# Patient Record
Sex: Female | Born: 1965 | Race: White | Hispanic: No | Smoking: Current some day smoker
Health system: Southern US, Community
[De-identification: ages and names within clinical notes are randomized; demographics above are authoritative.]

## PROBLEM LIST (undated history)

## (undated) DIAGNOSIS — F1910 Other psychoactive substance abuse, uncomplicated: Secondary | ICD-10-CM

## (undated) DIAGNOSIS — J449 Chronic obstructive pulmonary disease, unspecified: Secondary | ICD-10-CM

## (undated) DIAGNOSIS — N809 Endometriosis, unspecified: Secondary | ICD-10-CM

## (undated) DIAGNOSIS — T1491XA Suicide attempt, initial encounter: Secondary | ICD-10-CM

## (undated) DIAGNOSIS — R102 Pelvic and perineal pain: Secondary | ICD-10-CM

## (undated) DIAGNOSIS — K259 Gastric ulcer, unspecified as acute or chronic, without hemorrhage or perforation: Secondary | ICD-10-CM

## (undated) DIAGNOSIS — R55 Syncope and collapse: Secondary | ICD-10-CM

## (undated) DIAGNOSIS — G8929 Other chronic pain: Secondary | ICD-10-CM

## (undated) DIAGNOSIS — J45909 Unspecified asthma, uncomplicated: Secondary | ICD-10-CM

## (undated) HISTORY — PX: TOOTH EXTRACTION: SUR596

## (undated) HISTORY — PX: RADICAL HYSTERECTOMY WITH TRANSPOSITION OF OVARIES: SHX6222

## (undated) HISTORY — DX: Gastric ulcer, unspecified as acute or chronic, without hemorrhage or perforation: K25.9

## (undated) HISTORY — PX: CHOLECYSTECTOMY: SHX55

## (undated) HISTORY — PX: CERVICAL CONE BIOPSY: SUR198

## (undated) HISTORY — PX: ABDOMINAL HYSTERECTOMY: SHX81

---

## 1991-01-23 HISTORY — PX: TUBAL LIGATION: SHX77

## 1997-06-01 ENCOUNTER — Encounter: Admission: RE | Admit: 1997-06-01 | Discharge: 1997-06-01 | Payer: Self-pay | Admitting: Obstetrics & Gynecology

## 1997-06-01 ENCOUNTER — Other Ambulatory Visit: Admission: RE | Admit: 1997-06-01 | Discharge: 1997-06-01 | Payer: Self-pay | Admitting: Obstetrics & Gynecology

## 1999-06-29 ENCOUNTER — Encounter: Admission: RE | Admit: 1999-06-29 | Discharge: 1999-06-29 | Payer: Self-pay | Admitting: Internal Medicine

## 1999-08-28 ENCOUNTER — Inpatient Hospital Stay (HOSPITAL_COMMUNITY): Admission: EM | Admit: 1999-08-28 | Discharge: 1999-08-30 | Payer: Self-pay | Admitting: Emergency Medicine

## 1999-09-29 ENCOUNTER — Encounter: Admission: RE | Admit: 1999-09-29 | Discharge: 1999-09-29 | Payer: Self-pay | Admitting: Internal Medicine

## 1999-10-20 ENCOUNTER — Encounter: Admission: RE | Admit: 1999-10-20 | Discharge: 1999-10-20 | Payer: Self-pay | Admitting: Internal Medicine

## 2000-08-24 ENCOUNTER — Encounter: Payer: Self-pay | Admitting: *Deleted

## 2000-08-24 ENCOUNTER — Emergency Department (HOSPITAL_COMMUNITY): Admission: EM | Admit: 2000-08-24 | Discharge: 2000-08-24 | Payer: Self-pay | Admitting: *Deleted

## 2000-09-24 ENCOUNTER — Ambulatory Visit (HOSPITAL_COMMUNITY): Admission: RE | Admit: 2000-09-24 | Discharge: 2000-09-24 | Payer: Self-pay | Admitting: Internal Medicine

## 2001-05-26 ENCOUNTER — Encounter (HOSPITAL_COMMUNITY): Admission: RE | Admit: 2001-05-26 | Discharge: 2001-06-25 | Payer: Self-pay | Admitting: Orthopedic Surgery

## 2001-06-21 ENCOUNTER — Encounter: Payer: Self-pay | Admitting: Emergency Medicine

## 2001-06-21 ENCOUNTER — Emergency Department (HOSPITAL_COMMUNITY): Admission: EM | Admit: 2001-06-21 | Discharge: 2001-06-21 | Payer: Self-pay | Admitting: Emergency Medicine

## 2001-11-03 ENCOUNTER — Other Ambulatory Visit: Admission: RE | Admit: 2001-11-03 | Discharge: 2001-11-03 | Payer: Self-pay | Admitting: *Deleted

## 2001-11-24 ENCOUNTER — Encounter (INDEPENDENT_AMBULATORY_CARE_PROVIDER_SITE_OTHER): Payer: Self-pay | Admitting: Specialist

## 2001-11-24 ENCOUNTER — Ambulatory Visit (HOSPITAL_COMMUNITY): Admission: RE | Admit: 2001-11-24 | Discharge: 2001-11-24 | Payer: Self-pay | Admitting: *Deleted

## 2001-12-07 ENCOUNTER — Emergency Department (HOSPITAL_COMMUNITY): Admission: EM | Admit: 2001-12-07 | Discharge: 2001-12-07 | Payer: Self-pay | Admitting: Emergency Medicine

## 2001-12-14 ENCOUNTER — Emergency Department (HOSPITAL_COMMUNITY): Admission: EM | Admit: 2001-12-14 | Discharge: 2001-12-14 | Payer: Self-pay | Admitting: *Deleted

## 2001-12-14 ENCOUNTER — Encounter: Payer: Self-pay | Admitting: *Deleted

## 2002-04-22 ENCOUNTER — Emergency Department (HOSPITAL_COMMUNITY): Admission: EM | Admit: 2002-04-22 | Discharge: 2002-04-22 | Payer: Self-pay | Admitting: *Deleted

## 2002-05-19 ENCOUNTER — Encounter: Payer: Self-pay | Admitting: Emergency Medicine

## 2002-05-19 ENCOUNTER — Emergency Department (HOSPITAL_COMMUNITY): Admission: EM | Admit: 2002-05-19 | Discharge: 2002-05-19 | Payer: Self-pay | Admitting: Emergency Medicine

## 2002-05-27 ENCOUNTER — Encounter: Payer: Self-pay | Admitting: Emergency Medicine

## 2002-05-27 ENCOUNTER — Emergency Department (HOSPITAL_COMMUNITY): Admission: EM | Admit: 2002-05-27 | Discharge: 2002-05-27 | Payer: Self-pay | Admitting: Emergency Medicine

## 2003-01-11 ENCOUNTER — Emergency Department (HOSPITAL_COMMUNITY): Admission: EM | Admit: 2003-01-11 | Discharge: 2003-01-11 | Payer: Self-pay | Admitting: Emergency Medicine

## 2003-06-23 ENCOUNTER — Emergency Department (HOSPITAL_COMMUNITY): Admission: EM | Admit: 2003-06-23 | Discharge: 2003-06-23 | Payer: Self-pay | Admitting: Emergency Medicine

## 2004-03-06 ENCOUNTER — Emergency Department (HOSPITAL_COMMUNITY): Admission: EM | Admit: 2004-03-06 | Discharge: 2004-03-06 | Payer: Self-pay | Admitting: Emergency Medicine

## 2004-08-10 ENCOUNTER — Emergency Department (HOSPITAL_COMMUNITY): Admission: EM | Admit: 2004-08-10 | Discharge: 2004-08-10 | Payer: Self-pay | Admitting: Emergency Medicine

## 2005-05-28 ENCOUNTER — Inpatient Hospital Stay (HOSPITAL_COMMUNITY): Admission: EM | Admit: 2005-05-28 | Discharge: 2005-05-30 | Payer: Self-pay | Admitting: Emergency Medicine

## 2005-09-24 ENCOUNTER — Emergency Department: Payer: Self-pay | Admitting: Emergency Medicine

## 2005-11-23 ENCOUNTER — Emergency Department: Payer: Self-pay

## 2005-11-28 ENCOUNTER — Emergency Department: Payer: Self-pay | Admitting: Emergency Medicine

## 2006-01-22 DIAGNOSIS — T1491XA Suicide attempt, initial encounter: Secondary | ICD-10-CM

## 2006-01-22 HISTORY — DX: Suicide attempt, initial encounter: T14.91XA

## 2006-12-05 ENCOUNTER — Emergency Department: Payer: Self-pay | Admitting: Emergency Medicine

## 2006-12-08 ENCOUNTER — Inpatient Hospital Stay (HOSPITAL_COMMUNITY): Admission: EM | Admit: 2006-12-08 | Discharge: 2006-12-10 | Payer: Self-pay | Admitting: Emergency Medicine

## 2006-12-29 ENCOUNTER — Ambulatory Visit: Payer: Self-pay | Admitting: Psychiatry

## 2006-12-29 ENCOUNTER — Emergency Department (HOSPITAL_COMMUNITY): Admission: EM | Admit: 2006-12-29 | Discharge: 2006-12-29 | Payer: Self-pay | Admitting: Emergency Medicine

## 2006-12-29 ENCOUNTER — Inpatient Hospital Stay (HOSPITAL_COMMUNITY): Admission: AD | Admit: 2006-12-29 | Discharge: 2006-12-30 | Payer: Self-pay | Admitting: Psychiatry

## 2008-02-01 ENCOUNTER — Inpatient Hospital Stay: Payer: Self-pay | Admitting: Internal Medicine

## 2008-02-07 ENCOUNTER — Ambulatory Visit: Payer: Self-pay | Admitting: Internal Medicine

## 2008-06-01 ENCOUNTER — Emergency Department: Payer: Self-pay | Admitting: Emergency Medicine

## 2008-06-06 ENCOUNTER — Emergency Department: Payer: Self-pay | Admitting: Emergency Medicine

## 2008-06-24 ENCOUNTER — Emergency Department: Payer: Self-pay | Admitting: Emergency Medicine

## 2008-07-02 ENCOUNTER — Ambulatory Visit: Payer: Self-pay | Admitting: Emergency Medicine

## 2008-11-10 ENCOUNTER — Emergency Department (HOSPITAL_COMMUNITY): Admission: EM | Admit: 2008-11-10 | Discharge: 2008-11-11 | Payer: Self-pay | Admitting: Emergency Medicine

## 2009-05-07 ENCOUNTER — Emergency Department (HOSPITAL_COMMUNITY): Admission: EM | Admit: 2009-05-07 | Discharge: 2009-05-07 | Payer: Self-pay | Admitting: Emergency Medicine

## 2009-05-07 IMAGING — CT CT ABD-PELV W/O CM
1 of 2 series · 16 of 32 positions shown, 20 images · non-contrast
Comparison: none

REASON FOR EXAM: (1) R sided pain and hematuria; (2) r sided pain and
hematuria
COMMENTS:

[Series 2: stone · axial · 0.69mm/px · z∈[-381,+30]mm · 16 of 149 slices shown, 20 images]
[im 6/149  soft-tissue]
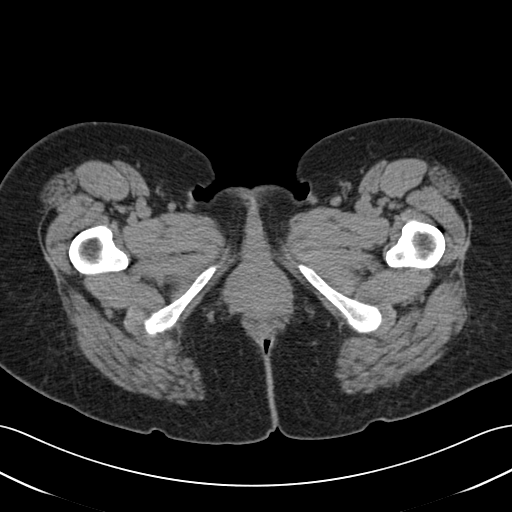
[im 6/149  bone]
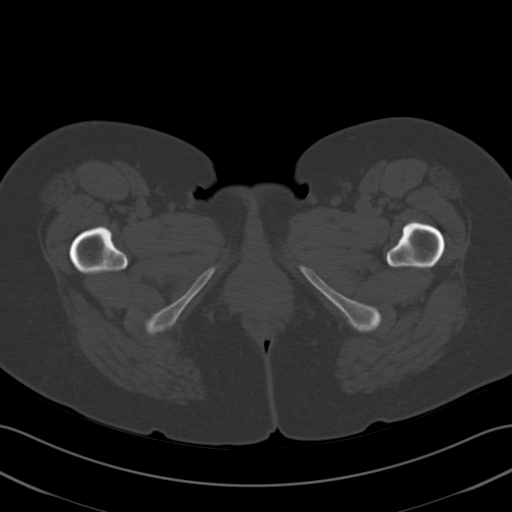
[im 16/149  soft-tissue]
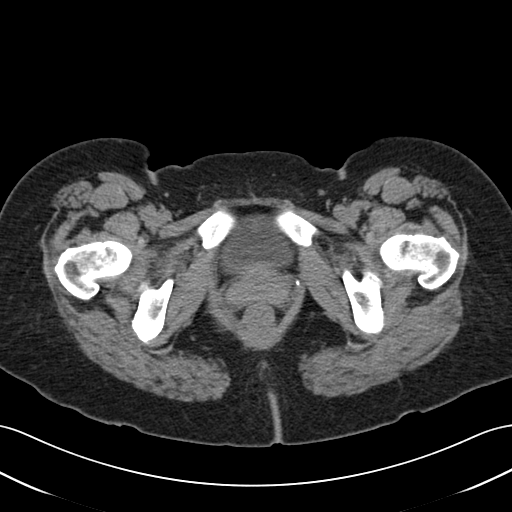
[im 27/149  soft-tissue]
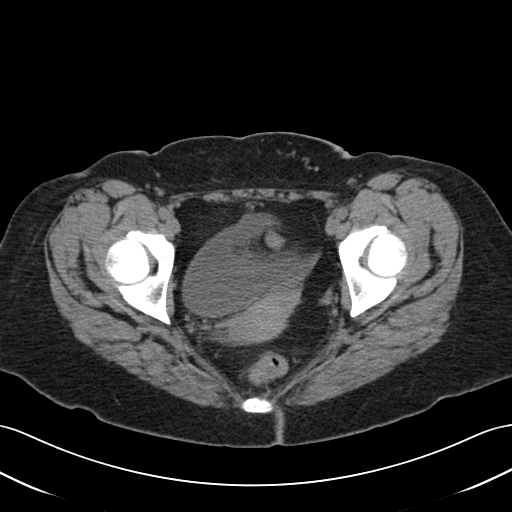
[im 38/149  soft-tissue]
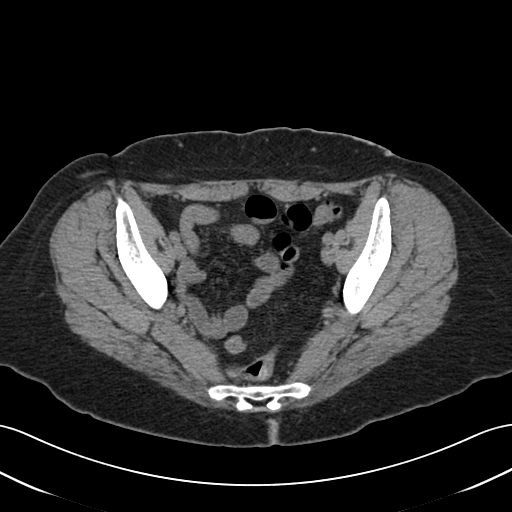
[im 48/149  soft-tissue]
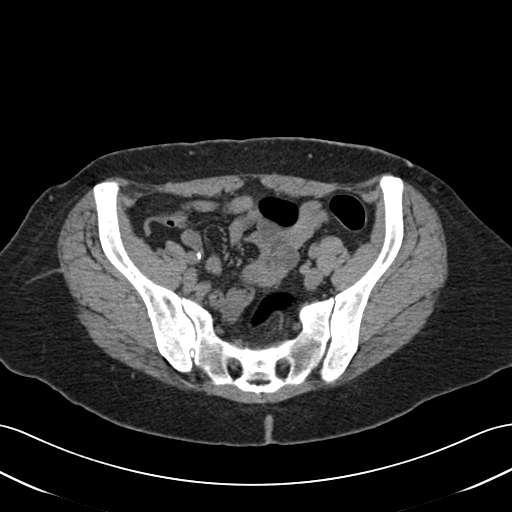
[im 59/149  soft-tissue]
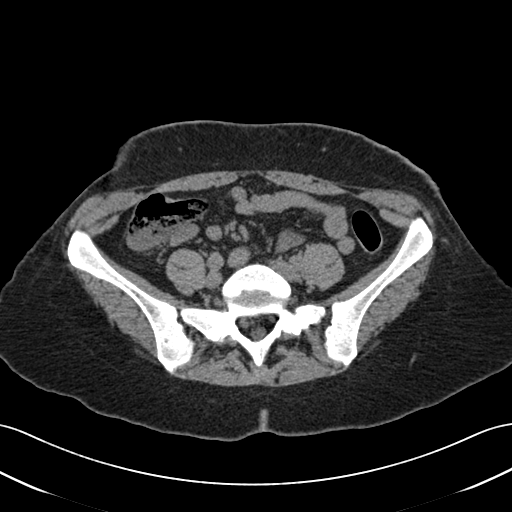
[im 69/149  soft-tissue]
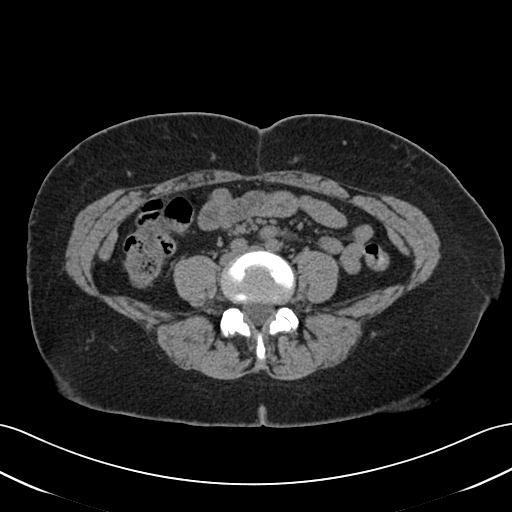
[im 80/149  soft-tissue]
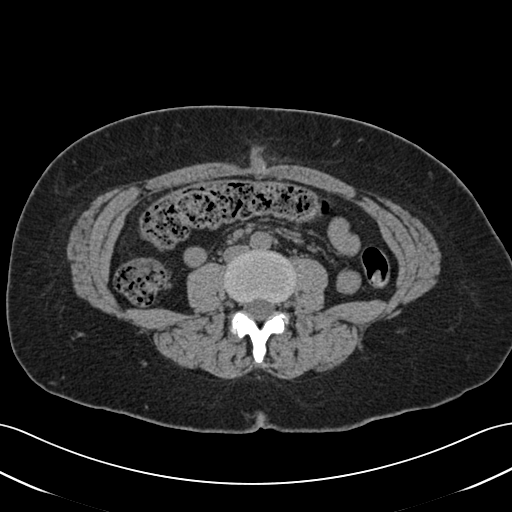
[im 90/149  soft-tissue]
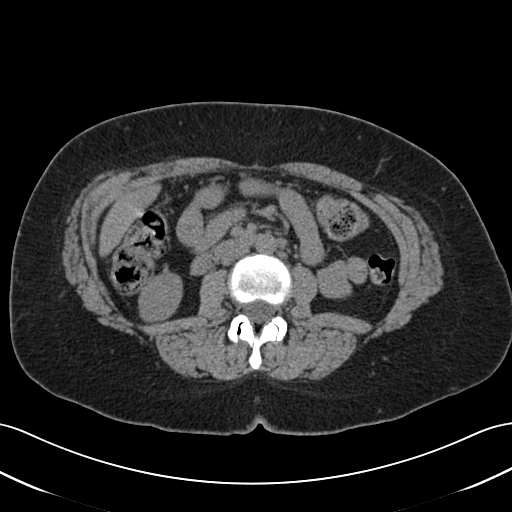
[im 90/149  bone]
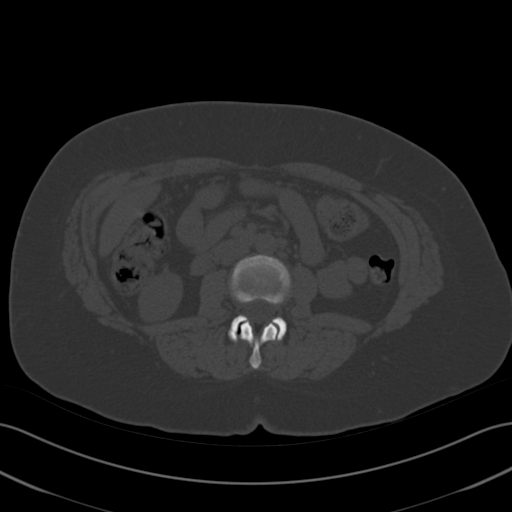
[im 101/149  soft-tissue]
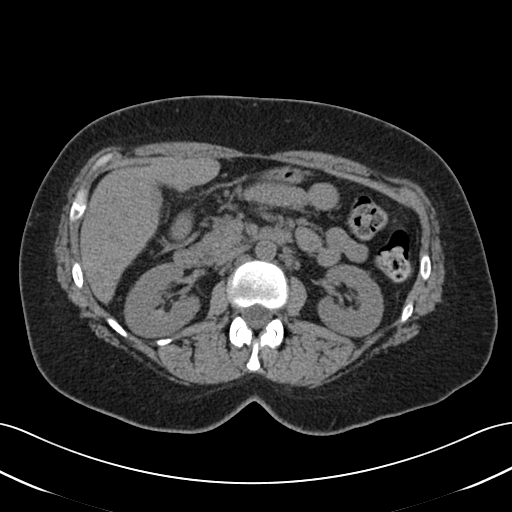
[im 112/149  soft-tissue]
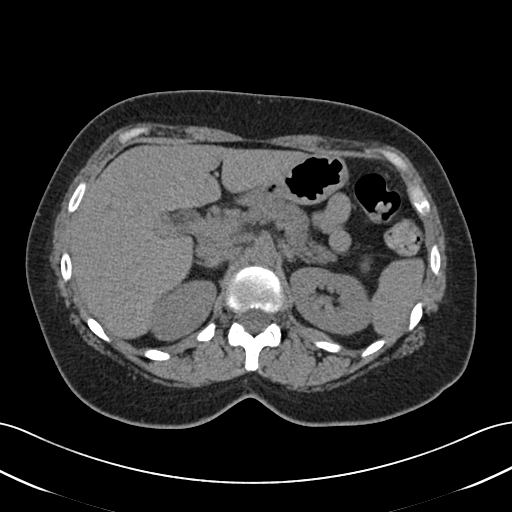
[im 122/149  soft-tissue]
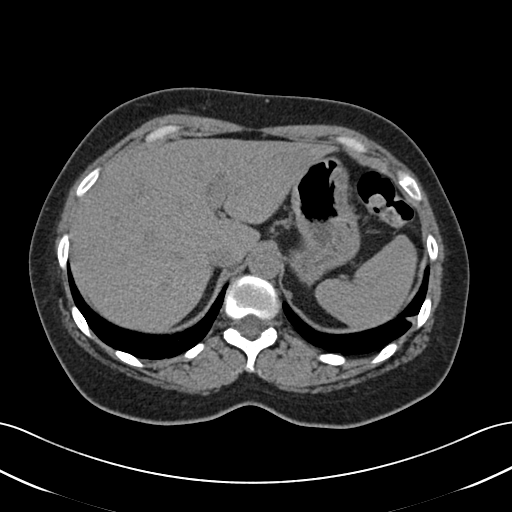
[im 127/149  lung]
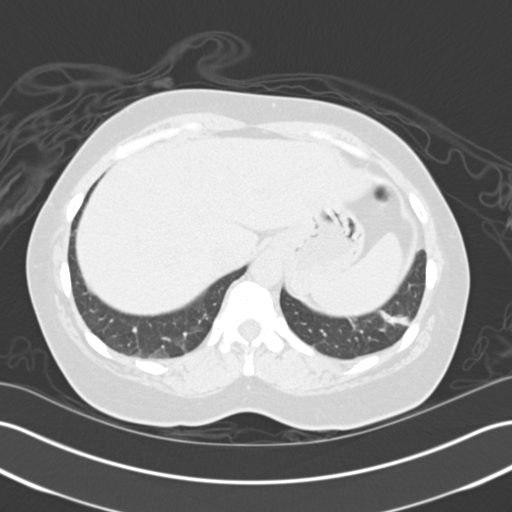
[im 133/149  soft-tissue]
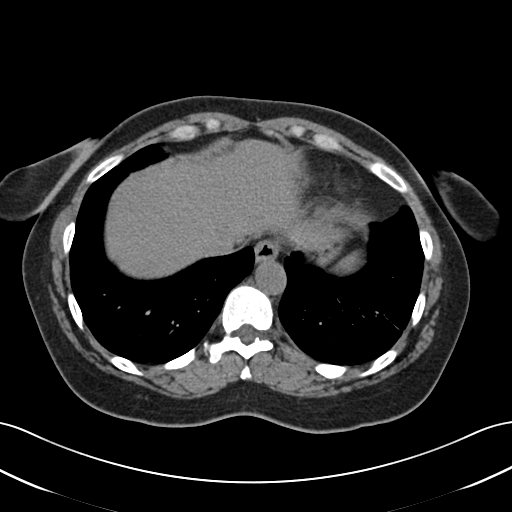
[im 133/149  lung]
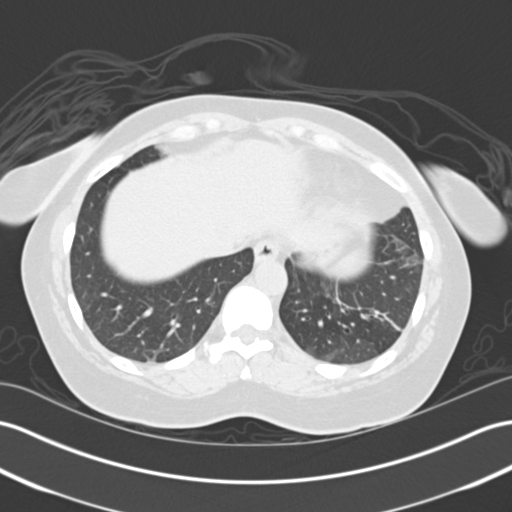
[im 138/149  lung]
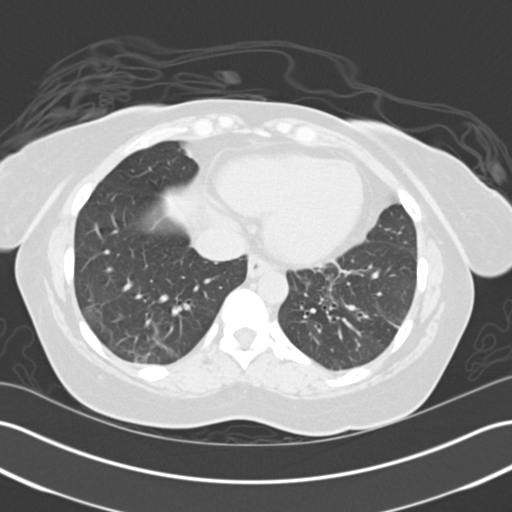
[im 143/149  soft-tissue]
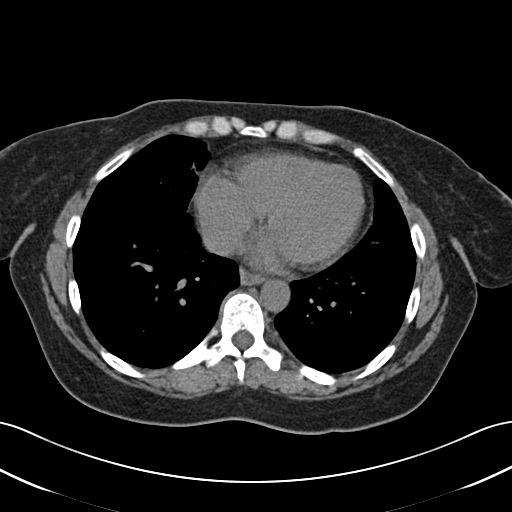
[im 143/149  lung]
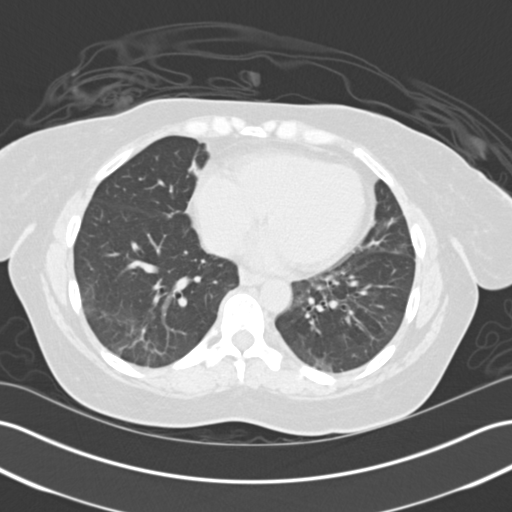

[16 of 32 positions shown; findings below may reference images not displayed]

PROCEDURE:     CT  - CT ABDOMEN AND PELVIS W[DATE]  [DATE]

RESULT:     The liver and spleen are normal.  The pancreas is normal.  The
patient has had a prior cholecystectomy.  The adrenals are normal.  No focal
renal abnormalities are identified. There is no hydrocephalus.  The RIGHT
lower quadrant is unremarkable.  Surgical clips are noted in the pelvis. No
inguinal adenopathy is noted. Basilar atelectasis is noted.
IMPRESSION: No acute abnormality.

## 2009-05-11 IMAGING — US US ABDOMEN COMPLETE
1 series · 14 of 25 positions shown · non-contrast
Comparison: 05/28/2005.

CLINICAL DATA: Cholelithiasis

ABDOMEN ULTRASOUND
TECHNIQUE: Complete abdominal ultrasound examination was performed including
evaluation of the liver, gallbladder, bile ducts, pancreas, kidneys, spleen,
IVC, and abdominal aorta.

[Series 1: unknown · 0.33mm/px · 14 of 44 slices shown]
[im 1/44]
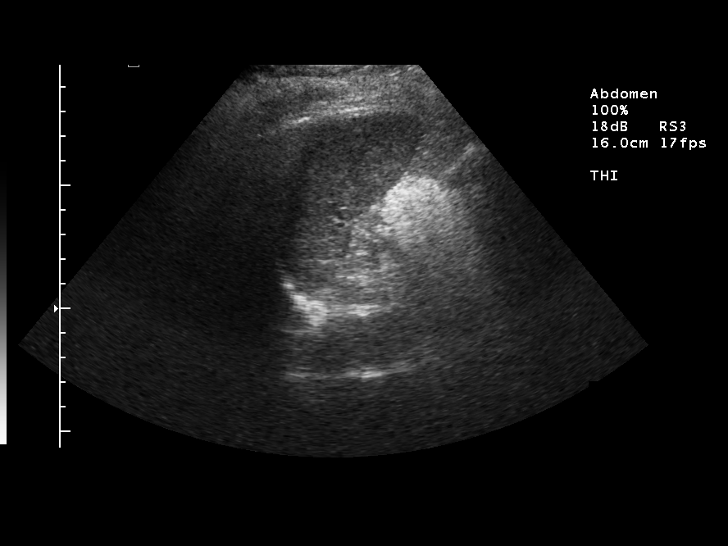
[im 4/44]
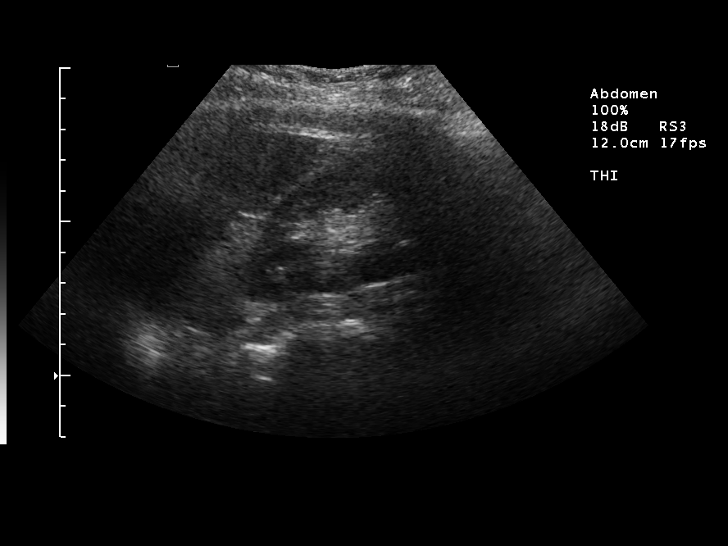
[im 8/44]
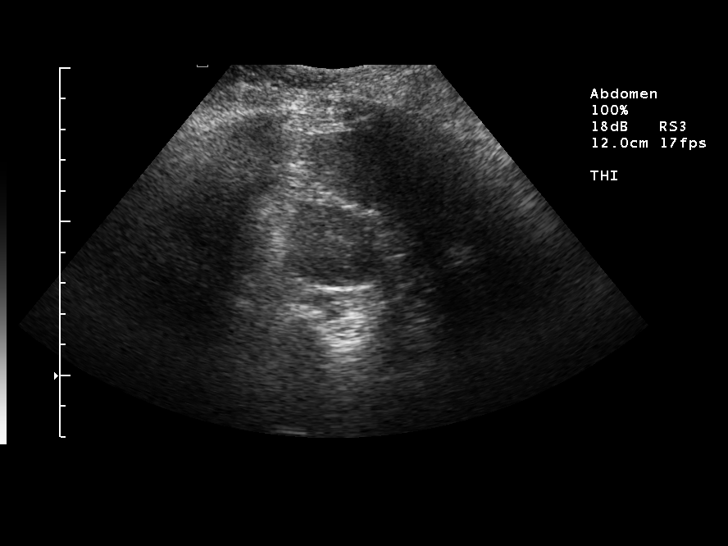
[im 11/44]
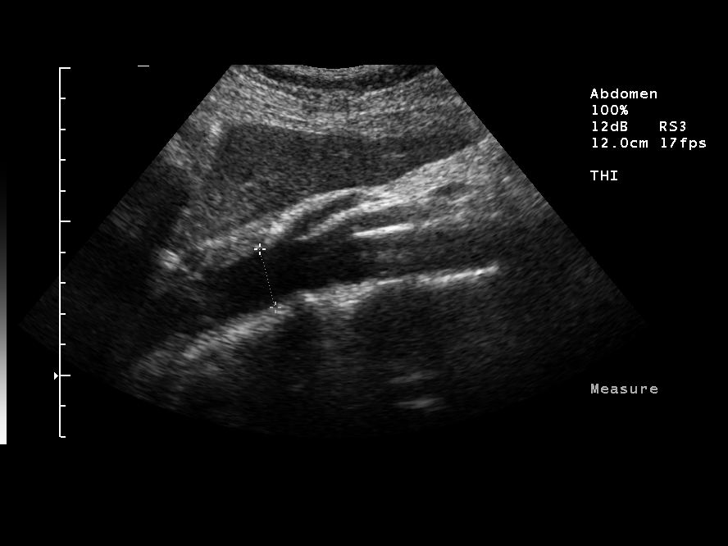
[im 15/44]
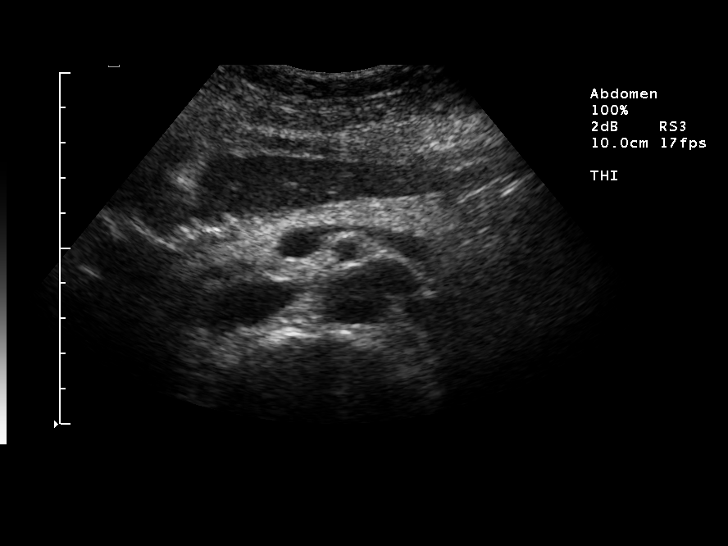
[im 17/44]
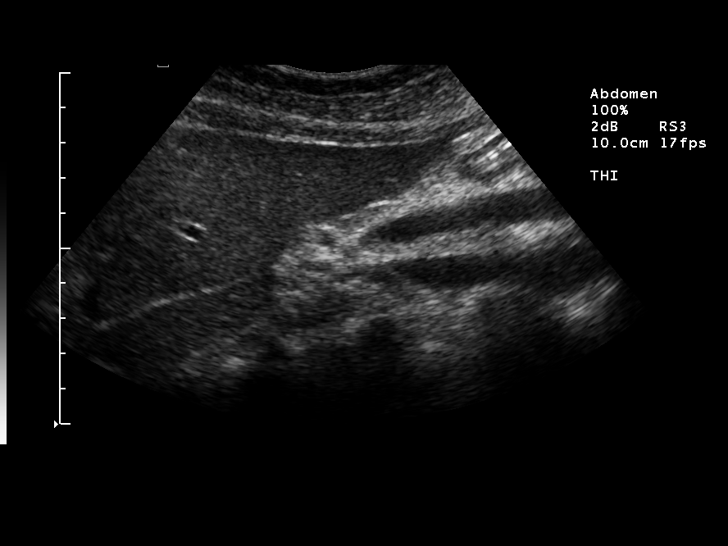
[im 20/44]
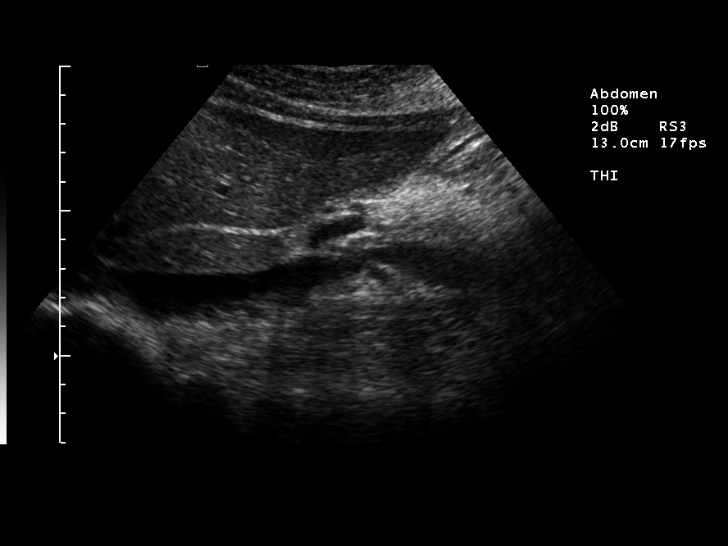
[im 24/44]
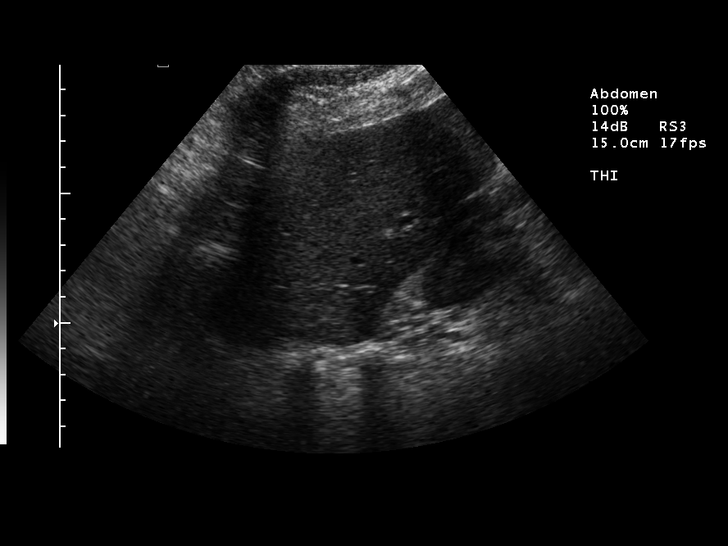
[im 27/44]
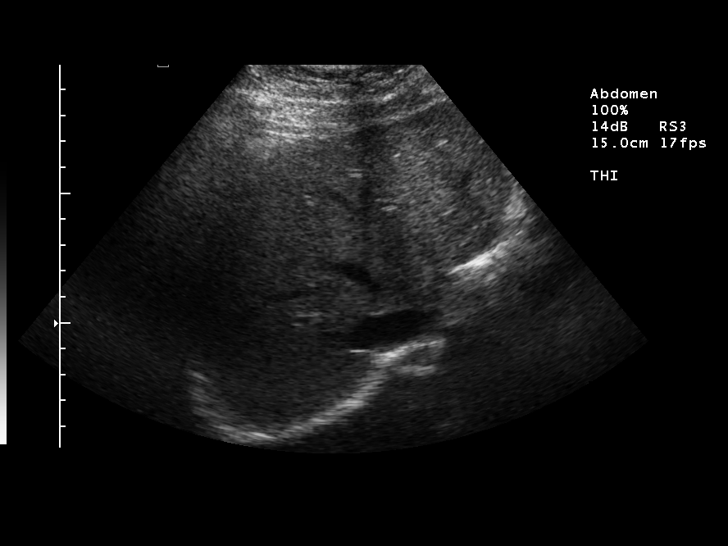
[im 29/44]
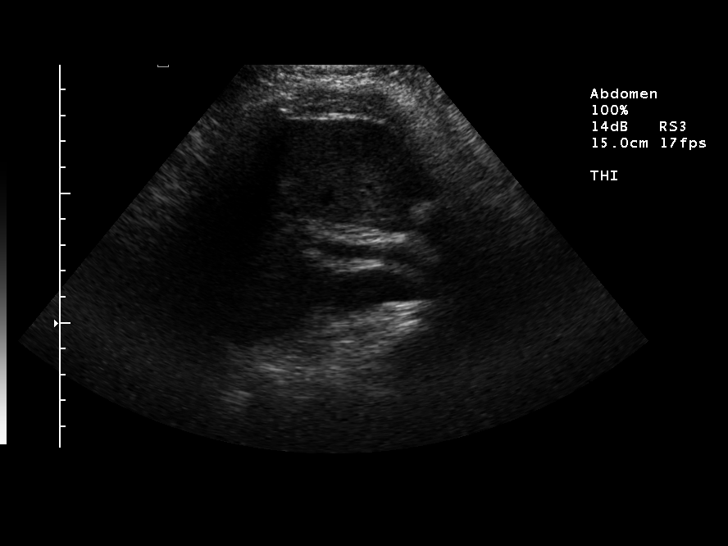
[im 33/44]
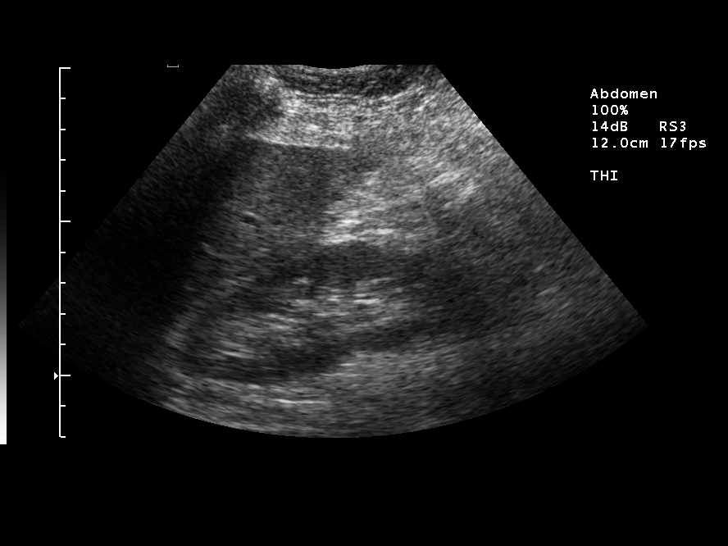
[im 36/44]
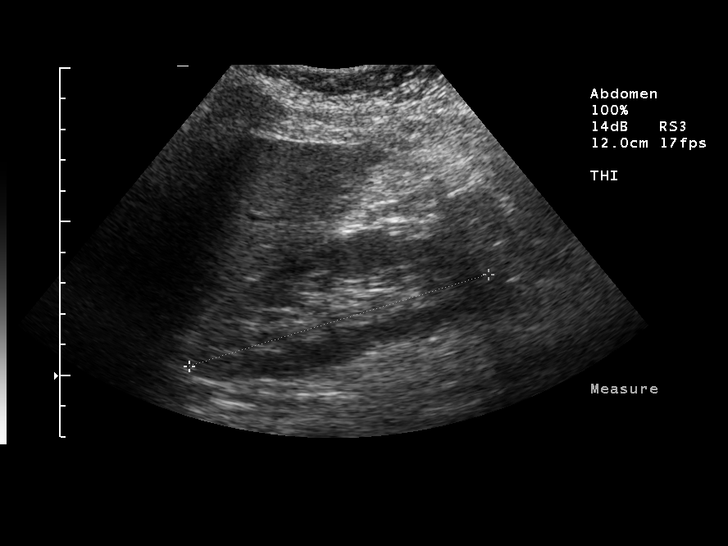
[im 40/44]
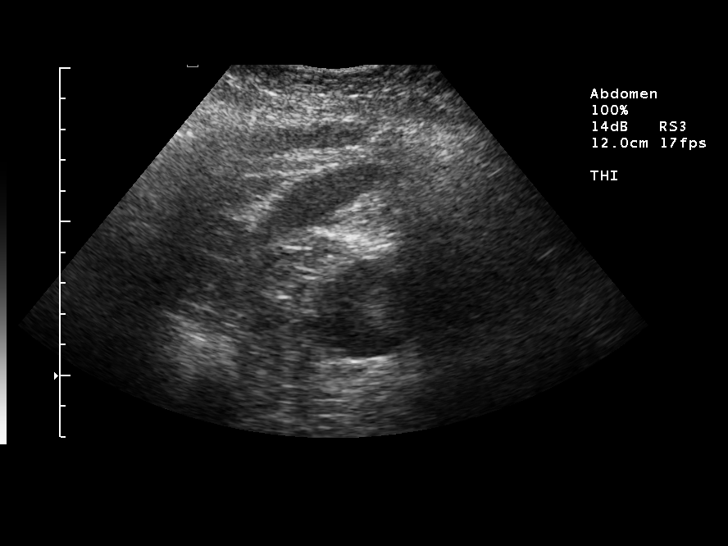
[im 44/44]
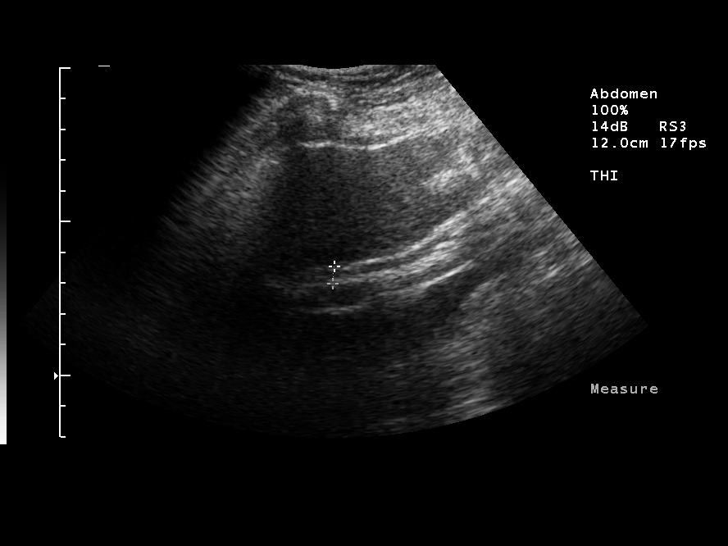

[14 of 25 positions shown; findings below may reference images not displayed]

FINDINGS: cholecystectomy. Common duct normal at 6 mm.

Liver, IVC, pancreas, and spleen all within normal limits.

Right kidney 10.6 and left kidney 10.6 cm. No hydronephrosis.

abdominal aorta non-aneurysmal without ascites.

IMPRESSION

1. No acute process in the  abdomen.
2. Cholecystectomy without biliary ductal dilatation.

## 2009-06-21 ENCOUNTER — Emergency Department: Payer: Self-pay | Admitting: Emergency Medicine

## 2010-03-28 ENCOUNTER — Emergency Department (HOSPITAL_COMMUNITY)
Admission: EM | Admit: 2010-03-28 | Discharge: 2010-03-28 | Discharge status: Against Medical Advice | Payer: No Typology Code available for payment source | Attending: Emergency Medicine | Admitting: Emergency Medicine

## 2010-03-28 DIAGNOSIS — M25569 Pain in unspecified knee: Secondary | ICD-10-CM | POA: Insufficient documentation

## 2010-04-27 LAB — DIFFERENTIAL
Basophils Relative: 1 % (ref 0–1)
Eosinophils Absolute: 0.4 10*3/uL (ref 0.0–0.7)
Lymphocytes Relative: 32 % (ref 12–46)
Lymphs Abs: 3.3 10*3/uL (ref 0.7–4.0)
Monocytes Absolute: 0.7 10*3/uL (ref 0.1–1.0)
Monocytes Relative: 6 % (ref 3–12)
Neutrophils Relative %: 57 % (ref 43–77)

## 2010-04-27 LAB — TROPONIN I: Troponin I: <0.01 ng/mL (ref 0.00–0.06)

## 2010-04-27 LAB — BASIC METABOLIC PANEL
CO2: 21 mEq/L (ref 19–32)
Calcium: 9.1 mg/dL (ref 8.4–10.5)
Creatinine, Ser: 0.77 mg/dL (ref 0.4–1.2)
GFR calc Af Amer: >60 mL/min (ref >60)
Glucose, Bld: 99 mg/dL (ref 70–99)

## 2010-04-27 LAB — POCT CARDIAC MARKERS: Myoglobin, poc: 30.7 ng/mL (ref 12–200)

## 2010-04-27 LAB — CBC: Platelets: 225 10*3/uL (ref 150–400)

## 2010-04-27 LAB — CK TOTAL AND CKMB (NOT AT ARMC)
CK, MB: 0.6 ng/mL (ref 0.3–4.0)
Relative Index: INVALID (ref 0.0–2.5)

## 2010-06-06 NOTE — Consult Note (Signed)
NAME:  Pamela Livingston, KOVAL NO.:  [id number]   MEDICAL RECORD NO.:  [id number]          PATIENT TYPE:  INP   LOCATION:  1502                         FACILITY:  [facility]   PHYSICIAN:  Antonietta Breach, M.D.  DATE OF BIRTH:  1965/02/12   DATE OF CONSULTATION:  12/10/2006  DATE OF DISCHARGE:  12/10/2006                                 CONSULTATION   REFERRING PHYSICIAN:   Mrs. [name] has recovered from her fatigue and depressed mood.  She is  not having any suicidal thoughts.  She has no thoughts of harming  others.  She has no delusions or hallucinations.  She is expressing an  appropriate level of regret about her overdose.  She realizes that it  was an impulsive act.  She is denying any hopelessness.  She describes  constructive future goals and interests.  Her energy is now within  normal limits.   The patient realizes that she has been worrying excessively and let a  number of worries come to a head.   MENTAL STATUS EXAM:  Mrs. [name] is alert.  She is oriented to all  spheres.  Her attention span is within normal limits.  Her eye contact  is good.  Her memory is intact except for the overdose blackout.  Her  fund of knowledge and intelligence are within normal limits.  Speech is  normal.  Thought process logical, coherent, goal-directed.  No looseness  of associations.  Thought content; no thoughts of harming herself, no  thoughts of harming others, no delusions.  No hallucinations. Judgment  is intact.  Affect is broad and appropriate.  Mood is within normal  limits.   ASSESSMENT:  1. Adjustment disorder with mixed disturbance of emotions and conduct.  2. 293.84-anxiety disorder not otherwise specified.  3. Polysubstance dependence.   Due to the patient's history of continued abuse of substances as well as  her recent impulsivity the undersigned did recommend that she attend an  inpatient rehabilitation program, however, the patient declined and she  is no  longer committable after recovering from her acute reactive  symptoms.   The patient does agree to call emergency services immediately for any  thoughts of harming herself, thoughts of harming others or distress.   RECOMMENDATIONS:  1. This patient could use some outpatient counseling for increasing      coping skills and stress management as well      as substance dependence outpatient therapy.  Would ask the case      manager to set this patient up with [facility]Drug Services in her      county as part of the county mental health system.  2. Twelve step groups and principles.      Antonietta Breach, M.D.  Electronically Signed     JW/MEDQ  D:  12/11/2006  T:  12/12/2006  Job:  [id number]

## 2010-06-06 NOTE — Consult Note (Signed)
NAME:  Pamela Livingston, Pamela Livingston NO.:  [id number]   MEDICAL RECORD NO.:  [id number]          PATIENT TYPE:  INP   LOCATION:  1223                         FACILITY:  [facility]   PHYSICIAN:  Antonietta Breach, M.D.  DATE OF BIRTH:  01-26-1965   DATE OF CONSULTATION:  12/09/2006  DATE OF DISCHARGE:                                 CONSULTATION   REASON FOR CONSULTATION:  Suicide attempt with overdose.   Dictation ended at this point.      Antonietta Breach, M.D.  Electronically Signed     JW/MEDQ  D:  12/09/2006  T:  12/10/2006  Job:  [id number]

## 2010-06-06 NOTE — Consult Note (Signed)
NAME:  Pamela Livingston, Pamela Livingston NO.:  [id number]   MEDICAL RECORD NO.:  [id number]          PATIENT TYPE:  INP   LOCATION:  1223                         FACILITY:  [facility]   PHYSICIAN:  Antonietta Breach, M.D.  DATE OF BIRTH:  1965/04/26   DATE OF CONSULTATION:  12/09/2006  DATE OF DISCHARGE:                                 CONSULTATION   REQUESTING PHYSICIAN:  [employer] [facility].   REASON FOR CONSULTATION:  Suicide attempt with overdose.   HISTORY OF PRESENT ILLNESS:  Pamela Livingston is a 45 year old female  admitted to the [facility] on December 08, 2006, due to an  overdose with Phenergan.   The patient took 500 mg of Phenergan with the intent to kill herself.  She has been under stress.  She married back in May of this year.  She  and her husband have had financial difficulties.  They had to move out  of one house into another and finally had [place]  She states that her husband has a severe drinking  problem.  She has not been wanting to be close to him.   The patient has approximately 4 weeks of depressed mood, decreased  energy, difficulty concentrating, anhedonia.  She has not been having  hallucinations or delusions.  Her orientation has been intact.  Her  memory function is intact.  She is cooperative with bedside care.   She has experienced severe obtundation followed by a stupor from the  Phenergan overdose.  However, she is now able to stay awake for the  interview.   PAST PSYCHIATRIC HISTORY:  The patient denies any history of increased  energy, elevated mood or decreased need for sleep.  She denies a history  of prior suicide attempts.  She denies any history of hallucination.  She does acknowledge a prior history of depression.   She was tried on Effexor, which made her feel worse.  She denies any  psychiatric admissions.   The patient does have an extensive history of cocaine use for several  months.   She used it every day, using large amounts of street value.  She continues to relapse approximately once a month.  She uses marijuana  daily.   FAMILY PSYCHIATRIC HISTORY:  None known.   SOCIAL HISTORY:  Please see the above.  The patient is unemployed.  She  denies drinking.   PAST MEDICAL HISTORY:  Overdose on Phenergan.   ALLERGIES:  ASPIRIN.   MEDICATIONS:  The MAR is reviewed.  The patient is on Ativan 2 mg q.4h.  p.r.n.   LABORATORY DATA:  TSH within normal limits.  Aspirin negative.  Alcohol  negative.  Sodium 142, BUN 15, creatinine 0.98.  WBC 9.8, hemoglobin  14.7, platelet count 286.  SGOT 25, SGPT 42.  Tylenol negative.  Urine  drug screen positive for benzodiazepines and cocaine.   REVIEW OF SYSTEMS:  CONSTITUTIONAL:  Afebrile.  No weight loss.  HEAD:  No trauma.  EYES:  No visual changes.  EARS:  No hearing impairment.  NOSE:  No rhinorrhea.  MOUTH/THROAT:  No  sore throat.  NEUROLOGIC:  No  focal motor or sensory changes.  PSYCHIATRIC:  On review of the past  medical record, in May 2007 depression was listed, confirming the  patient's history.  CARDIOVASCULAR:  No chest pain, palpitations.  RESPIRATORY:  No coughing or wheezing.  GASTROINTESTINAL:  No nausea,  vomiting, diarrhea.  GENITOURINARY:  No dysuria.  SKIN:  Unremarkable.  ENDOCRINE/METABOLIC:  No heat or cold intolerance.  MUSCULOSKELETAL:  No  deformities.  HEMATOLOGIC/LYMPHATIC:  No anemia.   EXAMINATION:  VITAL SIGNS:  Temperature 99.5, pulse 83, respiratory rate  23, blood pressure 98/62, O2 saturation on room air 98%.  GENERAL APPEARANCE:  Pamela Livingston is a middle-aged female partially  reclined in the supine position in her hospital bed.  She has no  abnormal involuntary movements.  OTHER MENTAL STATUS EXAM:  Pamela Livingston is alert.  Her attention span is  mildly decreased.  Her eye contact is good.  Her concentration is mildly  decreased.  Her affect is constricted.  Mood is depressed.  She is   oriented completely to all spheres.  Her memory is intact to immediate,  recent and remote except for the overdose blackout.  Fund of knowledge  and intelligence are within normal limits.  Speech involves normal rate  and prosody without dysarthria.  Thought process is logical, coherent,  goal-directed.  No looseness of associations.  Abstraction is intact.  Language expression and comprehension intact.  Thought content:  The  patient acknowledges suicidal intent.  She has no hallucinations or  delusions.  Insight is partial.  Judgment is impaired.   ASSESSMENT:  AXIS I:  293.83, mood disorder, not otherwise specified  (the patient has a history of functional depression; however, the course  has been complicated with her substance abuse).  Polysubstance  dependence.  Rule out 296.33, major depressive disorder recurrent,  severe.  AXIS II:  Deferred.  AXIS III:  See general medical section above.  AXIS IV:  Marital, economic primary support group.  AXIS V:  30.   Pamela Livingston is still at risk to harm herself.   RECOMMENDATIONS:  1. Would admit the patient to an inpatient psychiatric dual      diagnosis program when she is medically cleared.  2. Psychotropic medication deferred.  3. The undersigned provided ego-supportive psychotherapy and      education.  4. Would continue the sitter.  5. Twelve-step method and groups.      Antonietta Breach, M.D.  Electronically Signed     JW/MEDQ  D:  12/09/2006  T:  12/10/2006  Job:  [id number]

## 2010-06-06 NOTE — H&P (Signed)
NAME:  Pamela Livingston, Pamela Livingston NO.:  [id number]   MEDICAL RECORD NO.:  [id number]          PATIENT TYPE:  INP   LOCATION:  1230                         FACILITY:  [facility]   PHYSICIAN:  Herbie Saxon, MDDATE OF BIRTH:  Sep 04, 1965   DATE OF ADMISSION:  12/08/2006  DATE OF DISCHARGE:                              HISTORY & PHYSICAL   CHIEF COMPLAINT:  Overdose of Phenergan.   HISTORY OF PRESENT ILLNESS:  This is a 45 year old lady who was brought  to the emergency room by the EMS.  The patient had apparently ingested  500 mg of Phenergan in an apparent suicide attempt.  She is heavily  sedated and unable to add an further history.  The urine toxicology did  show cocaine, marijuana and benzodiazepines.   PAST MEDICAL HISTORY:  Bronchial asthma.   PAST SURGICAL HISTORY:  Not known.   SOCIAL HISTORY:  No documented history of alcohol or tobacco smoking,  but cocaine, marijuana and benzodiazepine abuse.   FAMILY HISTORY:  Not available.   REVIEW OF SYSTEMS:  Not available.   MEDICATIONS:  Advair Diskus and Phenergan.   ALLERGIES:  ASPIRIN.   PHYSICAL EXAMINATION:  GENERAL:  This is a lady who is intermittently  agitated on arousal.  VITAL SIGNS:  Temperature 98, pulse 88, respirations 14, blood pressure  110/81.  HEENT:  Constricted pupils.  Oropharynx and nasopharynx are clear.  Head  is atraumatic, normocephalic.  NECK:  Supple.  No elevated JVD or carotid bruit.  CHEST:  Clear.  HEART:  S1, S2 with tachycardia.  ABDOMEN:  Soft, nontender, no organomegaly.  Bowel sounds normoactive.  She is obtunded.  EXTREMITIES:  Peripheral pulses present.  No pedal edema.   LABORATORY DATA AND X-RAY FINDINGS:  WBC 9.8, hematocrit 42, platelet  count 286.  Urine toxicology shows positive cocaine, benzodiazepine and  tetrahydrocannabinol.  PTT 37, INR 1.0.  Acetaminophen level less than  10.  Chemistries shows a sodium of 142, potassium 3.7, chloride 107,  bicarb 26,  glucose 95, BUN 15, creatinine 09.  Alcohol level less than  5.  Urinalysis cloudy, leukocyte esterase small, many bacteria, wbc's 7-  10.  Please note that the patient had been given charcoal by the  emergency room physician.   EKG shows sinus tachycardia at 126 per minute with no specific ST and T-  wave changes.   ASSESSMENT:  1. Drug overdose with Phenergan, suicide attempt.  2. Urinary tract infection.  3. Substance abuse with cocaine, marijuana, benzodiazepines.  4. Sinus tachycardia.   PLAN:  The patient is to be admitted to a step-down unit.  She is to be  on suicide watch.  She will be on IV Cardizem 10 mg q.6 h. for the next  24 hours and re-evaluate.  CK-MB, troponin and EKG q.8 h. x3 to rule out  any acute coronary spasm.  Thyroid function test, urinalysis and culture  sensitivities.  Start on Rocephin 1 g IV daily.  To be on IV D-5 normal  saline at 100 mL an hour.  We will get a psychiatric evaluation.  Oxygen  2 L nasal  cannula p.r.n.  She is to be on fall, aspiration and seizure  precautions.  She will be on Lovenox 40 mg daily for DVT prophylaxis,  Protonix 40 mg IV daily and Ativan 2 mg IV q.4 h. p.r.n.  She will be  seen by case management and be held in detox as inpatient when she is  medically stable.  Tylenol 650 mg q.4-6 h. p.r.n. and Dilaudid 0.5 mg to  1 mg q.4 h. p.r.n. severe pain.  DuoNeb q.6 h. p.r.n.      Herbie Saxon, MD  Electronically Signed     MIO/MEDQ  D:  12/08/2006  T:  12/09/2006  Job:  [id number]

## 2010-06-06 NOTE — Discharge Summary (Signed)
NAME:  Pamela Livingston, Pamela Livingston NO.:  [id number]   MEDICAL RECORD NO.:  [id number]          PATIENT TYPE:  IPS   LOCATION:  0603                          FACILITY:  [facility]   PHYSICIAN:  Jasmine Pang, M.D. DATE OF BIRTH:  [birth year]   DATE OF ADMISSION:  12/29/2006  DATE OF DISCHARGE:  12/30/2006                               DISCHARGE SUMMARY   IDENTIFICATION:  A 45 year old married white female from [place],  [place].   HISTORY OF PRESENT ILLNESS:  The patient was admitted stating she wanted  detox from crack, cocaine and help with her mental health problems.  She  states she has used for the past 4 days straight and is tired of this  life.  She has issues with anxiety and feels very depressed about her  family situation.  She cannot see her 57 year old daughter because she  lost custody.  The patient was married 5 months ago, but there are still  some altercations with her ex-husband who also uses.  She had thoughts  of killing herself before she went on a 4-day crack binge.  At the time  of this admission, she was not suicidal, however.  The patient has a  history of depression and anxiety.  She is not currently in any  outpatient treatment.  She is on no medication.  She has been seen at  the [facility] in the past.  The patient  denies alcohol abuse.  She has a history of gastric ulcer disease.  She  is on Protonix.  She denies any other prescriptions.  She is allergic to  ASPIRIN.   PHYSICAL FINDINGS:  A complete physical exam was done in the ED prior to  admission.  There were no acute physical problems noted.   Admission laboratories were done in the ED prior to admission.  Urine  pregnancy test was negative.  A basic metabolic panel was within normal  limits except for a slightly elevated glucose at 102.  CBC was within  normal limits except for a slightly elevated hemoglobin of 15.3.  Urine  drug screen was positive for  cocaine and benzodiazepines and THC.   HOSPITAL COURSE:  Upon admission, the patient was started on trazodone  100 mg p.o. at bedtime p.r.n. insomnia.  The patient was started on  amantadine 100 mg p.o. b.i.d.  She was also started on a clonidine detox  protocol, but it was not felt later that she needed this because she was  not abusing opiates.  The patient was friendly and cooperative in exam  with me.  She had good eye contact.  Speech was normal in rate and flow.  Psychomotor activity was within normal limits.  She stated she wanted to  go home.  The mood was less depressed and anxious.  Affect, wide range.  There was no suicidal or homicidal ideation.  No thoughts of self-  injurious behavior.  Thoughts were logical and goal directed.  Thought  content, no predominant theme.  Cognition was grossly back to baseline.  The patient stated she wanted to go  home to be with her family.  She was  not planning to hurt herself.  She states she planned not use drugs any  more.  It was felt, the patient was stable to be discharged today.   DISCHARGE DIAGNOSES:  AXIS I:  Cocaine abuse.  Cocaine dependence.  Depressive disorder, not otherwise specified.  AXIS II:  None.  AXIS III:  Gastric ulcer disease.  AXIS IV:  Moderate (problems with primary support group, economic  problem, burden of chemical dependence use).  AXIS V:  Global Assessment of Functioning upon discharge was 60.  Global  Assessment of Functioning upon admission was 50.  Global Assessment of  Functioning highest past year was 65.   DISCHARGE PLAN:  There were no specific activity level or dietary  restrictions.   POST HOSPITAL CARE PLANS:  The patient will see Dr. Joni Reining on January 07, 2007, at 08:30 a.m.  She will also see a therapist at [facility] on  January 07, 2007, at 04:45 p.m.   DISCHARGE MEDICATION:  Amantadine 100 mg one tablet p.o. b.i.d.      Jasmine Pang, M.D.  Electronically Signed      BHS/MEDQ  D:  12/30/2006  T:  12/31/2006  Job:  [id number]

## 2010-06-09 NOTE — H&P (Signed)
NAME:  Pamela Livingston, Pamela Livingston                             ACCOUNT NO.:  [id number]   MEDICAL RECORD NO.:  [id number]                   PATIENT TYPE:  AMB   LOCATION:  SDC                                  FACILITY:  [facility]   PHYSICIAN:  [place] B. Earlene Plater, M.D.               DATE OF BIRTH:  May 15, 1965   DATE OF ADMISSION:  11/24/2001  DATE OF DISCHARGE:                                HISTORY & PHYSICAL   PREOPERATIVE DIAGNOSES:  1. Pelvic pain.  2. Dysmenorrhea.  3. History of endometriosis.  4. Left ovarian cyst.   PROCEDURE:  Laparoscopic left salpingo-oophorectomy.   HISTORY OF PRESENT ILLNESS:  A 45 year old white female gravida 2, para 2  with a history of endometriosis with associated dysmenorrhea and  dyspareunia.  Has previously had laparoscopy [facility] and was found to  have endometriosis and is status post right salpingo-oophorectomy for a  previous ovarian cyst.  Is using narcotic pain medication during her menses.  Is sexually active using tubal ligation for contraception.  Has used birth  control pills which did not improve her symptoms and were associated with  menstrual migraines, nausea, and vomiting.  Has used Depo Lupron in the past  which did improve pain, however, had severe emotional lability.  Is  interested in proceeding with definitive surgical therapy.   PAST MEDICAL HISTORY:  Asthma.  No recent exacerbations and no history of  hospitalization or need for ventilator.  Also, history of depression.   PAST OBSTETRICAL HISTORY:  Two previous deliveries.  Cesarean section x1.  Vaginal delivery x1.  History of migraines.   PAST SURGICAL HISTORY:  Laparoscopy in 1999, endometriosis and another  laparoscopy for ovarian, cesarean section x1, tubal ligation, cervical  conization.   MEDICATIONS:  Advair, Combivent, Singulair, clonazepam, Darvocet, and  another depression medication.  The patient unsure of the name.   ALLERGIES:  ASPIRIN.   SOCIAL HISTORY:  No tobacco,  alcohol, or other drugs.   FAMILY HISTORY:  Noncontributory.   REVIEW OF SYSTEMS:  Otherwise noncontributory.   PHYSICAL EXAMINATION:  VITAL SIGNS:  Blood pressure 110/72, weight 143.5,  height 5 feet 0 inches.  GENERAL:  Alert and oriented, in no acute distress.  SKIN:  Warm, dry.  No lesions.  NECK:  Supple.  No thyromegaly.  HEART:  Regular rate and rhythm.  LUNGS:  Clear to auscultation.  ABDOMEN:  Liver, spleen normal without hernia.  Pfannenstiel and laparoscopy  incisions noted.  LYMPH NODE:  Survey negative to neck, axilla, and groin.  BREASTS:  No dominant masses, nipple discharge, or adenopathy.  PELVIC:  Normal external genitalia.  Vagina and cervix normal.  Recent Pap  smear is normal.  No adnexal masses.  Positive bilateral mild adnexal  tenderness.   LABORATORIES:  Recent ultrasound in the office shows two simple appearing  left ovarian cysts and one 2 cm complex ovarian cyst.  Uterus normal.  Right  ovary surgically absent.   ASSESSMENT:  1. Endometriosis.  2. Pelvic pain.  3. Left ovarian cyst suspicious for endometrioma.   PLAN:  The patient desires definitive surgical therapy with laparoscopic  left salpingo-oophorectomy.  Operative risks discussed including infection,  bleeding, damage to bowel, bladder, surrounding organs.  The patient also  has a vulvar lesion that she would like to have biopsied at the same time.                                               Gerri Spore B. Earlene Plater, M.D.    WBD/MEDQ  D:  11/17/2001  T:  11/17/2001  Job:  [id number]

## 2010-06-09 NOTE — Discharge Summary (Signed)
NAME:  Pamela Livingston, Pamela Livingston                   ACCOUNT NO.:  [id number]   MEDICAL RECORD NO.:  [id number]          PATIENT TYPE:  INP   LOCATION:  5730                         FACILITY:  [facility]   PHYSICIAN:  Jonna L. Robb Matar, M.D.DATE OF BIRTH:  [birth year]   DATE OF ADMISSION:  05/27/2005  DATE OF DISCHARGE:  05/30/2005                                 DISCHARGE SUMMARY   DISCHARGE DIAGNOSES:  1.  Left otitis media.  2.  Acute sinusitis.  3.  Gastritis secondary to NSAID's.  4.  Hematuria.  5.  Cholelithiasis.  6.  History of asthma.   REASON FOR ADMISSION:  Unassigned.   CODE STATUS:  Full.   ALLERGIES:  ASPIRIN gives her a rash.   HISTORY OF PRESENT ILLNESS:  The patient developed right abdominal pain in  the epigastric area.  She had been taking a lot of ibuprofen because she has  had a lot of pain and inflammation in the left ear, swollen glands,  tenderness in the sinuses.  The patient has not noticed any overt hematuria.  She has a past history of asthma.  She smokes one pack a day.   PHYSICAL EXAMINATION:  VITAL SIGNS:  Temperature 101.5, pulse 122, blood  pressure 152/90.  She had pharyngeal erythema, erythematous tympanic  membrane, left cervical adenopathy greater than the right.  She had a tender  epigastrium, but there was not really much tenderness over the kidneys or  bladder.   LABORATORY DATA:  White count 13.7, urinalysis showed no pyuria, but was  positive for hemoglobin and hematuria.   Ultrasound showed cholelithiasis.   CT of the abdomen showed some postoperative changes in the right lower  quadrant from endometriosis, but no other findings.   HOSPITAL COURSE:  The patient was started on Rocephin or Reglan and Zantac.  Rocephin was changed to Zosyn and she was started on a Z-Pak.  By the  following day, she was beginning to feel better, although, she still had  very tender cervical adenopathy.  On the day of discharge, she was much  improved.    DISPOSITION:   DISCHARGE MEDICATIONS:  The patient will be discharged on:  1.  Pepcid 20 b.i.d. for a week.  2.  Ceftin 250 b.i.d. for 6 days.  3.  Zithromax 250 daily for 3 more days.  4.  Decongestants.  5.  Hot liquids.   She will also get some information regarding [facility], but I have  requested that she quit smoking.      Jonna L. Robb Matar, M.D.  Electronically Signed     JLB/MEDQ  D:  05/30/2005  T:  05/31/2005  Job:  [id number]

## 2010-06-09 NOTE — Discharge Summary (Signed)
NAME:  Pamela Livingston, PANIK NO.:  [id number]   MEDICAL RECORD NO.:  [id number]          PATIENT TYPE:  INP   LOCATION:  1502                         FACILITY:  [facility]   PHYSICIAN:  Lonia Blood, M.D.       DATE OF BIRTH:  July 31, 1965   DATE OF ADMISSION:  12/08/2006  DATE OF DISCHARGE:  12/10/2006                               DISCHARGE SUMMARY   PRIMARY CARE PHYSICIAN:  Unassigned.   DISCHARGE DIAGNOSES:  1. Promethazine overdose with oversedation - resolved.  2. Suicide gesture.  3. Polysubstance abuse.  4. Anxiety disorder.  5. Adjustment disorder with mixed disturbances.  6. Asthma.   DISCHARGE MEDICATIONS:  1. Advair Diskus twice a day.  2. Prilosec OTC.  3. Tylenol as needed for pain.  4. Multivitamin daily.   CONDITION ON DISCHARGE:  Pamela Livingston was discharged home under the care  of her family.  She was advised to seek outpatient counseling for  polysubstance abuse.   CONSULTATIONS THIS ADMISSION:  The patient was seen by Dr. Antonietta Breach from psychiatry.   PROCEDURES:  No procedures were done.   HISTORY AND PHYSICAL:  Refer to dictated H&P done by Dr. Christella Noa.   HOSPITAL COURSE:  Problem 1.  Pamela Livingston was admitted on the night of  December 08, 2006 after she was found barely responsive by her family.  The patient took an overdose of promethazine.  Initially, we thought the  patient may have had a suicide attempt.  The patient was admitted to the  intensive care unit, and she was observed to assure that she could  protect her airways.  The patient was kept on telemetry, and EKGs have  been checked every hours.  The patient did not have any QT prolongation  or arrhythmias.  She did not require intubation.  On December 09, 2006,  she had the first psychiatric assessment, but she was too delirious to  participate.  As the substance has cleared further from her system on  December 10, 2006, the patient woke up in a very calm state and she has  politely asked to leave and go home.  She denied suicide attempt.  She  reported that she was just trying to make a point to her husband.  The  psychiatrist on staff has seen the patient and has cleared her for  discharge.   Problem 2.  Polysubstance abuse.  Pamela Livingston was counseled about the  dangers of ongoing illicit substance abuse.  The patient voiced  understanding.   Problem 3.  Chronic abdominal pain.  We thought initially that the  abdominal pain was somehow related to patient's known cholelithiasis.  Later though it has become apparent that the patient's gallbladder has  been removed in the interim.  Also, lipase was checked and was within  normal limits.  Liver function tests were checked and were within normal  limits.  Urine culture did not have any significant growth.  Ms.  Pamela Livingston abdominal pain had improved in the hospital, and at the time  of discharge she did not complain further of abdominal pain.  The  possibility of a functional component could not  be ruled out.      Lonia Blood, M.D.  Electronically Signed     SL/MEDQ  D:  12/13/2006  T:  12/14/2006  Job:  [id number]

## 2010-06-09 NOTE — H&P (Signed)
NAME:  Pamela Livingston, Pamela Livingston NO.:  [id number]   MEDICAL RECORD NO.:  [id number]          PATIENT TYPE:  EMS   LOCATION:  MAJO                         FACILITY:  [facility]   PHYSICIAN:  Jonna L. Robb Matar, M.D.DATE OF BIRTH:  Dec 20, 1965   DATE OF ADMISSION:  05/27/2005  DATE OF DISCHARGE:                                HISTORY & PHYSICAL   PRIMARY CARE PHYSICIAN:  Unassigned.   CHIEF COMPLAINT:  Sore right abdomen.   HISTORY:  This 45 year old female has two complaints, the first one is  soreness in the right flank since yesterday. It hurts on her right side and  around to her back.  Yesterday evening she developed a fever.  She has had  no nausea, vomiting, diarrhea or constipation, no gross hematuria.  She has  also been complaining of pain and soreness in her left ear of a couple of  days duration as well.   ALLERGIES:  ASPIRIN. She does not know what happens when she takes it.  She  says her mother told her not to use it.  She has, however, taken anti-  inflammatories as an adult and has had no reaction to that.   MEDICATIONS:  None, but as noted, she had previously been on Advair,  Singulair and Combivent.   PAST MEDICAL HISTORY:  1.  Asthma with question of COPD.  Patient, in the past, has been on Advair,      Singulair and Combivent but she has not used them in months because she      has no insurance.  2.  Syncope is listed in her old history and physical.  3.  Endometriosis, left ovarian cyst.  4.  Depression.   PAST SURGICAL HISTORY:  1.  Cesarean section.  2.  Bilateral oophorectomies.   FAMILY HISTORY:  She is gravida 2, para 2.  Her daughters are 42 and 32, no  problems.  She has a sister with obstructive sleep apnea and she states  there is some heart disease in her family.   SOCIAL HISTORY:  She smokes one pack per day.  No alcohol, no drugs.   REVIEW OF SYSTEMS:  Positive for fever and chills.  She has had pneumonia  before.  Allergies,  sinusitis.  Denies thyroid problems or diabetes.  No  chest pain, palpitations or heart murmurs.  No previous history of  gallbladder trouble or liver trouble, stomach problems, hematemesis or  melena.  No history of kidney disease.  No arthritis.  No skin problems. No  present anxiety or depression.   PHYSICAL EXAMINATION:  GENERAL APPEARANCE:  She is a well-developed,  Caucasian female, rather listless, appears ill.  VITAL SIGNS:  Temperature 101.5, pulse 122 on admission which came down to  98, respiratory rate 24, blood pressure 152/90 down to 113/76.  HEENT:  Normocephalic.  Extraocular movements are full.  Conjunctivae are  normal.  There is pharyngeal erythema.  Tympanic membranes erythematous.  NECK:  She has left cervical adenopathy, none on the right.  No thyromegaly  or carotid bruits.  LUNGS:  Respiratory effort  is normal.  Lungs are clear to auscultation and  percussion without wheezing, rhonchi or rales.  No dullness.  CARDIOVASCULAR:  She has a regular rate and rhythm, normal S1 and S2 without  murmurs, rubs, or gallops. There is no clubbing, cyanosis, or edema.  BREASTS:  Without masses or tenderness.  ABDOMEN:  Tender in the epigastrium and perhaps slightly over the right  kidney, none over the left or the bladder.  Bowel sounds are positive.  GU:  External genitalia is normal.  EXTREMITIES:  No clubbing, cyanosis, or edema.  SKIN:  Hot and dry and intact.  NEUROLOGIC:  Patient is alert and oriented x3.  Normal memory judgment and  affect.   INITIAL LABORATORY DATA:  White count of 13.7.  Urinalysis shows no pyuria  but is positive for hemoglobin and hematuria.  Lipase is normal at 23.   Chest x-ray shows some bronchial thickening without infiltrate.  Ultrasound  showed cholelithiasis but no acute changes or biliary duct dilatation.  CT  of the abdomen and pelvis shows no renal stones or obstruction.  She has  some postoperative changes in the right lower  quadrant.   IMPRESSION:  1.  Epigastric pain.  We are going to put her on some H2 blockers and check      stool for occult blood.  2.  Left otitis media.  Put her on some Rocephin.  3.  Hematuria.  This may represent renal stone that has passed or some other      causes of hematuria and she may warrant an outpatient urology      evaluation.  4.  History of asthma.  This is not active at present.      Jonna L. Robb Matar, M.D.  Electronically Signed     JLB/MEDQ  D:  05/28/2005  T:  05/28/2005  Job:  [id number]

## 2010-06-09 NOTE — Op Note (Signed)
NAME:  Pamela Livingston, Pamela Livingston                             ACCOUNT NO.:  [id number]   MEDICAL RECORD NO.:  [id number]                   PATIENT TYPE:  AMB   LOCATION:  SDC                                  FACILITY:  [facility]   PHYSICIAN:  [place] B. Earlene Plater, M.D.               DATE OF BIRTH:  [birth year]   DATE OF PROCEDURE:  11/24/2001  DATE OF DISCHARGE:                                 OPERATIVE REPORT   PREOPERATIVE DIAGNOSES:  1. Pelvic pain.  2. Dysmenorrhea.  3. History of endometriosis.  4. Left ovarian cyst.   POSTOPERATIVE DIAGNOSES:  1. Pelvic pain.  2. Dysmenorrhea.  3. History of endometriosis.  4. Left ovarian cyst.   PROCEDURE:  1. Open laparoscopy.  2. Left salpingo-oophorectomy.  3. Fulguration of endometriosis.   SURGEON:  Chester Holstein. Earlene Plater, M.D.   ANESTHESIA:  General.   FINDINGS:  Left ovarian cyst.  One endometriotic implant on the cul-de-sac  cauterized with bipolar.   ESTIMATED BLOOD LOSS:  Less than 50 cc.   COMPLICATIONS:  None.   SPECIMENS:  Left tube and ovary.   INDICATIONS:  The patient with the above symptoms presents for definitive  surgical therapy.  Has failed medical management.   PROCEDURE:  The patient taken to the operating room and general anesthesia  obtained.  She was placed in the ski position and prepped and draped in the  standard fashion.  The bladder was emptied with a red rubber catheter.  The  Hulka tenaculum was inserted and attached to the anterior lip of the cervix.  Gowns and gloves are changed.  Attention turned to the abdomen.  A 10 mm  vertical infraumbilical skin incision was made with a knife, carried sharply  to the underlying fascia.  The fascia was divided sharply and elevated with  Kocher clamps.  The posterior sheath and peritoneum were divided sharply  with the knife with elevation of the abdominal wall.  The purse-string  suture of 0 Vicryl was placed around the fascial defect and the Hasson  cannula inserted and secured.   The laparoscope was inserted and  pneumoperitoneum obtained with CO2 gas.  The patient was placed in  Trendelenburg position.  A 5 mm port was inserted 2 cm above the symphysis  and 6 cm off the midline on each side under direct laparoscopic  visualization.   The pelvis was inspected and above findings noted.  The left tube and ovary  were placed on traction and the course of the ureter identified.  It was  noted to be well away from the IP ligament.  The IP ligament was then triply  burned with bipolar cautery and divided sharply.  Dissection was continued  towards the cornu.  Just distal to the uterine cornu the tube and uterine  ovarian ligament were cauterized with bipolar and divided sharply.  The tube  and ovary were removed  through the Hasson port as an intact specimen with  removal of the Hasson cannula.   The Hasson was then reinserted and pneumoperitoneum reobtained.  A few  bleeders were noted along the broad ligament just distal to the cornu.  These were cauterized with bipolar.  The single endometriotic implant in the  posterior cul-de-sac was cauterized with bipolar cautery.   The inferior ports were removed and their sites inspected.  They were  hemostatic.   The scope was removed, gas released, and the purse-string suture snugged  down after I inserted my index finger through the fascial defect.  This  obliterated the defect and no intra-abdominal contents herniated through  prior to closure.  The skin site was closed with subcuticular 4-0 Vicryl.  The Hulka tenaculum was removed and the cervix was hemostatic.   The patient tolerated procedure well.  There were no complications.  She was  taken to recovery room awake, alert, in stable condition.                                               Gerri Spore B. Earlene Plater, M.D.    WBD/MEDQ  D:  11/24/2001  T:  11/24/2001  Job:  [id number]

## 2010-10-30 LAB — POCT PREGNANCY, URINE
Operator id: 284251
Preg Test, Ur: NEGATIVE

## 2010-10-30 LAB — I-STAT 8, (EC8 V) (CONVERTED LAB)
Acid-base deficit: 2
Glucose, Bld: 102 — ABNORMAL HIGH
HCT: 45
Hemoglobin: 15.3 — ABNORMAL HIGH
Operator id: 284251
Sodium: 142

## 2010-10-30 LAB — RAPID URINE DRUG SCREEN, HOSP PERFORMED
Amphetamines: NOT DETECTED
Barbiturates: NOT DETECTED
Benzodiazepines: POSITIVE — AB
Cocaine: POSITIVE — AB
Opiates: NOT DETECTED
Tetrahydrocannabinol: POSITIVE — AB

## 2010-10-30 LAB — POCT CARDIAC MARKERS: Myoglobin, poc: 47.3

## 2010-10-30 LAB — POCT I-STAT CREATININE: Operator id: 284251

## 2010-10-30 LAB — ETHANOL: Alcohol, Ethyl (B): <5

## 2010-10-31 LAB — CBC
HCT: 32.6 — ABNORMAL LOW
HCT: 42.2
MCHC: 34.7
MCV: 91.3
MCV: 91.6
Platelets: 230
Platelets: 286
RDW: 13
RDW: 13.3
WBC: 9.8

## 2010-10-31 LAB — COMPREHENSIVE METABOLIC PANEL
ALT: 22
AST: 25
Albumin: 3 — ABNORMAL LOW
Albumin: 4.4
Alkaline Phosphatase: 95
BUN: 15
BUN: 9
Calcium: 8.2 — ABNORMAL LOW
Chloride: 107
Creatinine, Ser: 0.84
Creatinine, Ser: 0.98
GFR calc Af Amer: >60
Glucose, Bld: 95
Total Bilirubin: 1

## 2010-10-31 LAB — DIFFERENTIAL
Basophils Absolute: 0.1
Lymphocytes Relative: 27
Monocytes Absolute: 0.6
Neutro Abs: 6.3
Neutrophils Relative %: 64

## 2010-10-31 LAB — CK TOTAL AND CKMB (NOT AT ARMC)
CK, MB: 4.1 — ABNORMAL HIGH
Relative Index: 0.8
Relative Index: 1.1
Total CK: 515 — ABNORMAL HIGH

## 2010-10-31 LAB — URINALYSIS, ROUTINE W REFLEX MICROSCOPIC
Bilirubin Urine: NEGATIVE
Glucose, UA: NEGATIVE
Ketones, ur: NEGATIVE
Nitrite: NEGATIVE
Specific Gravity, Urine: 1.019
Urobilinogen, UA: 0.2
pH: 6

## 2010-10-31 LAB — URINE CULTURE

## 2010-10-31 LAB — TROPONIN I
Troponin I: 0.01
Troponin I: 0.03

## 2010-10-31 LAB — PROTIME-INR
INR: 1
Prothrombin Time: 13.6

## 2010-10-31 LAB — RAPID URINE DRUG SCREEN, HOSP PERFORMED: Opiates: NOT DETECTED

## 2010-10-31 LAB — LIPASE, BLOOD: Lipase: 14

## 2010-10-31 LAB — SALICYLATE LEVEL: Salicylate Lvl: <4

## 2010-10-31 LAB — TSH: TSH: 2.517

## 2010-10-31 LAB — URINE MICROSCOPIC-ADD ON

## 2010-10-31 LAB — ACETAMINOPHEN LEVEL: Acetaminophen (Tylenol), Serum: <10 — ABNORMAL LOW

## 2010-10-31 LAB — APTT: aPTT: 37

## 2011-08-16 ENCOUNTER — Emergency Department (HOSPITAL_COMMUNITY): Payer: Self-pay

## 2011-08-16 ENCOUNTER — Emergency Department (HOSPITAL_COMMUNITY)
Admission: EM | Admit: 2011-08-16 | Discharge: 2011-08-16 | Disposition: A | Discharge status: Home / Self Care | Payer: Self-pay | Attending: Emergency Medicine | Admitting: Emergency Medicine

## 2011-08-16 ENCOUNTER — Encounter (HOSPITAL_COMMUNITY): Payer: Self-pay | Admitting: Emergency Medicine

## 2011-08-16 DIAGNOSIS — R109 Unspecified abdominal pain: Secondary | ICD-10-CM | POA: Insufficient documentation

## 2011-08-16 DIAGNOSIS — J4489 Other specified chronic obstructive pulmonary disease: Secondary | ICD-10-CM | POA: Insufficient documentation

## 2011-08-16 DIAGNOSIS — K5730 Diverticulosis of large intestine without perforation or abscess without bleeding: Secondary | ICD-10-CM | POA: Insufficient documentation

## 2011-08-16 DIAGNOSIS — F17200 Nicotine dependence, unspecified, uncomplicated: Secondary | ICD-10-CM | POA: Insufficient documentation

## 2011-08-16 DIAGNOSIS — R079 Chest pain, unspecified: Secondary | ICD-10-CM | POA: Insufficient documentation

## 2011-08-16 DIAGNOSIS — Z9089 Acquired absence of other organs: Secondary | ICD-10-CM | POA: Insufficient documentation

## 2011-08-16 DIAGNOSIS — J449 Chronic obstructive pulmonary disease, unspecified: Secondary | ICD-10-CM | POA: Insufficient documentation

## 2011-08-16 HISTORY — DX: Chronic obstructive pulmonary disease, unspecified: J44.9

## 2011-08-16 LAB — BASIC METABOLIC PANEL
BUN: 20 mg/dL (ref 6–23)
Calcium: 10.2 mg/dL (ref 8.4–10.5)
Creatinine, Ser: 0.71 mg/dL (ref 0.50–1.10)
GFR calc Af Amer: >90 mL/min (ref >90)
GFR calc non Af Amer: >90 mL/min (ref >90)
Potassium: 3.7 mEq/L (ref 3.5–5.1)

## 2011-08-16 LAB — URINE MICROSCOPIC-ADD ON

## 2011-08-16 LAB — URINALYSIS, ROUTINE W REFLEX MICROSCOPIC
Glucose, UA: NEGATIVE mg/dL
Specific Gravity, Urine: >1.03 — ABNORMAL HIGH (ref 1.005–1.030)
pH: 5.5 (ref 5.0–8.0)

## 2011-08-16 LAB — CBC WITH DIFFERENTIAL/PLATELET
Basophils Relative: 0 % (ref 0–1)
Eosinophils Absolute: 0.1 10*3/uL (ref 0.0–0.7)
Hemoglobin: 14 g/dL (ref 12.0–15.0)
MCH: 32.3 pg (ref 26.0–34.0)
MCHC: 35.1 g/dL (ref 30.0–36.0)
Monocytes Absolute: 0.6 10*3/uL (ref 0.1–1.0)
Monocytes Relative: 7 % (ref 3–12)
Neutrophils Relative %: 61 % (ref 43–77)

## 2011-08-16 MED ORDER — OXYCODONE-ACETAMINOPHEN 5-325 MG PO TABS: 1.0000 | ORAL_TABLET | Freq: Four times a day (QID) | ORAL | Status: AC | PRN

## 2011-08-16 MED ORDER — HYDROMORPHONE HCL PF 1 MG/ML IJ SOLN
1.0000 mg | Freq: Once | INTRAMUSCULAR | Status: AC
  Administered 2011-08-16: 1 mg via INTRAMUSCULAR
  Filled 2011-08-16: qty 1

## 2011-08-16 MED ORDER — ONDANSETRON HCL 8 MG PO TABS: 8.0000 mg | ORAL_TABLET | ORAL | Status: AC | PRN

## 2011-08-16 MED ORDER — ONDANSETRON 8 MG PO TBDP
8.0000 mg | ORAL_TABLET | Freq: Once | ORAL | Status: AC
  Administered 2011-08-16: 8 mg via ORAL
  Filled 2011-08-16: qty 1

## 2011-08-16 NOTE — ED Notes (Signed)
Pt actively vomiting at this time. Dr. Adriana Simas notified.

## 2011-08-16 NOTE — ED Notes (Signed)
Pt requested water which was provided per Dr. Adriana Simas.

## 2011-08-16 NOTE — ED Notes (Addendum)
Pt now c/o mild intermittent CP.  States that she gets CP when she has kidney issues.

## 2011-08-16 NOTE — ED Provider Notes (Addendum)
History  This chart was scribed for Donnetta Hutching, MD by Erskine Emery. This patient was seen in room APA12/APA12 and the patient's care was started at 11:45.   CSN: [id number]  Arrival date & time 08/16/11  1106   First MD Initiated Contact with Patient 08/16/11 1145      Chief Complaint  Patient presents with  . Flank Pain  . Chest Pain    (Consider location/radiation/quality/duration/timing/severity/associated sxs/prior treatment) HPI  Pamela Livingston is a 46 y.o. female who presents to the Emergency Department complaining of moderate lower back pain bilaterally and intermittent dull left chest pain with associated chills but no dysuria.  Pt has a h/o kidney infections her entire life, including one in March for which she was prescribed antibiotics. Pt describes the current symptoms as similar to previous episodes. 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 Pt reports no personal h/o heart problems but has a strong family history of heart failure, including her mother who had a heart attack at a young age. 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   Past Medical History  Diagnosis Date  . COPD (chronic obstructive pulmonary disease)   . Renal disorder     Past Surgical History  Procedure Date  . Abdominal hysterectomy   . Cholecystectomy     History reviewed. No pertinent family history.  History  Substance Use Topics  . Smoking status: Current Everyday Smoker -- 0.5 packs/day    Types: Cigarettes  . Smokeless tobacco: Not on file  . Alcohol Use: No    OB History    Grav Para Term Preterm Abortions TAB SAB Ect Mult Living                  Review of Systems  Constitutional: Negative for fatigue.  HENT: Negative for congestion and sinus pressure.   Eyes: Negative for discharge.    Respiratory: Negative for cough.   Cardiovascular: Positive for chest pain.  Gastrointestinal: Negative for abdominal pain and diarrhea.  Genitourinary: Negative for dysuria, frequency and hematuria.  Musculoskeletal: Positive for back pain.  Skin: Negative for rash.  Neurological: Negative for headaches.  Psychiatric/Behavioral: Negative for hallucinations.    Allergies  Asa  Home Medications   Current Outpatient Rx  Name Route Sig Dispense Refill  . ACETAMINOPHEN 325 MG PO TABS Oral Take 975 mg by mouth every 6 (six) hours as needed. Pain      Triage Vitals: BP 146/99  Pulse 81  Temp 98.6 F (37 C) (Oral)  Resp 20  Ht 4\' 11"  (1.499 m)  Wt 150 lb (68.04 kg)  BMI 30.30 kg/m2  SpO2 98%  Physical Exam  Nursing note and vitals reviewed. Constitutional: She is oriented to person, place, and time. She appears well-developed and well-nourished.  HENT:  Head: Normocephalic and atraumatic.  Eyes: Conjunctivae and EOM are normal. Pupils are equal, round, and reactive to light.  Neck: Normal range of motion. Neck supple.  Cardiovascular: Normal rate, regular rhythm and normal heart sounds.   Pulmonary/Chest: Effort normal and breath sounds normal. She exhibits tenderness.  Abdominal: Soft. Bowel sounds are normal. There is no tenderness.  Musculoskeletal: Normal range of motion.       Minimal flank tenderness bilaterally  Neurological: She is alert and oriented to person, place, and time.  Skin: Skin is  warm and dry.  Psychiatric: She has a normal mood and affect.    ED Course  Procedures (including critical care time) DIAGNOSTIC STUDIES: Oxygen Saturation is 98% on room air, normal by my interpretation.    COORDINATION OF CARE: 12:10--I evaluated the patient and we discussed a treatment plan including EKG to which the pt agreed.  Ct Abdomen Pelvis Wo Contrast  08/16/2011  *RADIOLOGY REPORT*  Clinical Data: Right flank pain.  CT ABDOMEN AND PELVIS WITHOUT CONTRAST   Technique:  Multidetector CT imaging of the abdomen and pelvis was performed following the standard protocol without intravenous contrast.  Comparison: Abdominal ultrasound 12/09/2006.  Findings: Mild dependent atelectasis is present at the left lung base.  The lungs are otherwise clear.  The heart size is normal. No significant pleural or pericardial effusion is evident.  The liver and spleen are within normal limits.  The stomach, duodenum, and pancreas are within normal limits.  The common bile duct is normal following cholecystectomy.  The patient is status post cholecystectomy.  The adrenal glands are normal bilaterally.  There is no significant nephrolithiasis or hydronephrosis.  The ureters are within normal limits bilaterally.  The urinary bladder is collapsed.  Despite the clinical history, the uterus is present.  The ovaries are not clearly identified and may be surgically absent.  Mild diverticular changes are present in the rectosigmoid colon without focal inflammation to suggest diverticulitis.  The remainder of the colon is within normal limits.  The appendix is visualized and normal.  The small bowel is unremarkable.  No significant adenopathy or free fluid is present.  The bone windows reveal mild facet degenerative changes at L4-5 and L5-S1 bilaterally.  IMPRESSION:  1.  No evidence for nephrolithiasis or hydronephrosis. 2.  No acute or focal abnormality to explain the patient's symptoms. 3.  Mild sigmoid diverticulosis without evidence for diverticulitis. 4.  Status post cholecystectomy.  Original Report Authenticated By: Jamesetta Orleans. MATTERN, M.D.   Labs Reviewed - No data to display No results found. Results for orders placed during the hospital encounter of 08/16/11  CBC WITH DIFFERENTIAL      Component Value Range   WBC 8.6  4.0 - 10.5 K/uL   RBC 4.34  3.87 - 5.11 MIL/uL   Hemoglobin 14.0  12.0 - 15.0 g/dL   HCT 16.1  09.6 - 04.5 %   MCV 91.9  78.0 - 100.0 fL   MCH 32.3  26.0 -  34.0 pg   MCHC 35.1  30.0 - 36.0 g/dL   RDW 40.9  81.1 - 91.4 %   Platelets 251  150 - 400 K/uL   Neutrophils Relative 61  43 - 77 %   Neutro Abs 5.2  1.7 - 7.7 K/uL   Lymphocytes Relative 31  12 - 46 %   Lymphs Abs 2.7  0.7 - 4.0 K/uL   Monocytes Relative 7  3 - 12 %   Monocytes Absolute 0.6  0.1 - 1.0 K/uL   Eosinophils Relative 1  0 - 5 %   Eosinophils Absolute 0.1  0.0 - 0.7 K/uL   Basophils Relative 0  0 - 1 %   Basophils Absolute 0.0  0.0 - 0.1 K/uL  BASIC METABOLIC PANEL      Component Value Range   Sodium 137  135 - 145 mEq/L   Potassium 3.7  3.5 - 5.1 mEq/L   Chloride 102  96 - 112 mEq/L   CO2 22  19 - 32 mEq/L  Glucose, Bld 99  70 - 99 mg/dL   BUN 20  6 - 23 mg/dL   Creatinine, Ser 6.57  0.50 - 1.10 mg/dL   Calcium 84.6  8.4 - 96.2 mg/dL   GFR calc non Af Amer >90  >90 mL/min   GFR calc Af Amer >90  >90 mL/min  TROPONIN I      Component Value Range   Troponin I <0.30  <0.30 ng/mL  URINALYSIS, ROUTINE W REFLEX MICROSCOPIC      Component Value Range   Color, Urine YELLOW  YELLOW   APPearance CLEAR  CLEAR   Specific Gravity, Urine >1.030 (*) 1.005 - 1.030   pH 5.5  5.0 - 8.0   Glucose, UA NEGATIVE  NEGATIVE mg/dL   Hgb urine dipstick LARGE (*) NEGATIVE   Bilirubin Urine MODERATE (*) NEGATIVE   Ketones, ur TRACE (*) NEGATIVE mg/dL   Protein, ur 952 (*) NEGATIVE mg/dL   Urobilinogen, UA 0.2  0.0 - 1.0 mg/dL   Nitrite NEGATIVE  NEGATIVE   Leukocytes, UA NEGATIVE  NEGATIVE  URINE MICROSCOPIC-ADD ON      Component Value Range   Squamous Epithelial / LPF MANY (*) RARE   WBC, UA 0-2  <3 WBC/hpf   RBC / HPF 11-20  <3 RBC/hpf   Bacteria, UA MANY (*) RARE   Crystals CA OXALATE CRYSTALS (*) NEGATIVE   Urine-Other MUCOUS PRESENT      No diagnosis found.  Date: 08/16/2011  Rate: 73  Rhythm: normal sinus rhythm  QRS Axis: normal  Intervals: normal  ST/T Wave abnormalities: normal  Conduction Disutrbances:none  Narrative Interpretation:   Old EKG Reviewed:  changes noted c SA   MDM   Patient is in no acute distress. Urinalysis shows a hemoglobin and RBCs. CT scan negative for stone. Discharge home with Percocet #10 and Zofran #6.  EKG and troponin negative       I personally performed the services described in this documentation, which was scribed in my presence. The recorded information has been reviewed and considered.          Donnetta Hutching, MD 08/16/11 [id number]  Donnetta Hutching, MD 08/16/11 [phone]

## 2011-08-16 NOTE — ED Notes (Signed)
Pt states having nausea and vomiting with flank pain since Monday. Also complaining of chest discomfort at times. Pt seen at hospital in [place] for similar symptoms in march.

## 2011-08-16 NOTE — ED Notes (Signed)
Sprite and crackers given to pt 

## 2013-04-21 ENCOUNTER — Encounter (HOSPITAL_COMMUNITY): Payer: Self-pay | Admitting: Emergency Medicine

## 2013-04-21 ENCOUNTER — Emergency Department (HOSPITAL_COMMUNITY): Payer: Self-pay

## 2013-04-21 ENCOUNTER — Emergency Department (HOSPITAL_COMMUNITY)
Admission: EM | Admit: 2013-04-21 | Discharge: 2013-04-21 | Disposition: A | Discharge status: Home / Self Care | Payer: Self-pay | Attending: Emergency Medicine | Admitting: Emergency Medicine

## 2013-04-21 DIAGNOSIS — J45901 Unspecified asthma with (acute) exacerbation: Principal | ICD-10-CM

## 2013-04-21 DIAGNOSIS — F17200 Nicotine dependence, unspecified, uncomplicated: Secondary | ICD-10-CM | POA: Insufficient documentation

## 2013-04-21 DIAGNOSIS — F1910 Other psychoactive substance abuse, uncomplicated: Secondary | ICD-10-CM | POA: Insufficient documentation

## 2013-04-21 DIAGNOSIS — Z8659 Personal history of other mental and behavioral disorders: Secondary | ICD-10-CM | POA: Insufficient documentation

## 2013-04-21 DIAGNOSIS — R5381 Other malaise: Secondary | ICD-10-CM | POA: Insufficient documentation

## 2013-04-21 DIAGNOSIS — J441 Chronic obstructive pulmonary disease with (acute) exacerbation: Secondary | ICD-10-CM | POA: Insufficient documentation

## 2013-04-21 DIAGNOSIS — G8929 Other chronic pain: Secondary | ICD-10-CM | POA: Insufficient documentation

## 2013-04-21 DIAGNOSIS — J40 Bronchitis, not specified as acute or chronic: Secondary | ICD-10-CM

## 2013-04-21 DIAGNOSIS — Z8742 Personal history of other diseases of the female genital tract: Secondary | ICD-10-CM | POA: Insufficient documentation

## 2013-04-21 DIAGNOSIS — R5383 Other fatigue: Secondary | ICD-10-CM

## 2013-04-21 HISTORY — DX: Other chronic pain: G89.29

## 2013-04-21 HISTORY — DX: Syncope and collapse: R55

## 2013-04-21 HISTORY — DX: Unspecified asthma, uncomplicated: J45.909

## 2013-04-21 HISTORY — DX: Suicide attempt, initial encounter: T14.91XA

## 2013-04-21 HISTORY — DX: Endometriosis, unspecified: N80.9

## 2013-04-21 HISTORY — DX: Pelvic and perineal pain: R10.2

## 2013-04-21 HISTORY — DX: Other psychoactive substance abuse, uncomplicated: F19.10

## 2013-04-21 LAB — BASIC METABOLIC PANEL
BUN: 9 mg/dL (ref 6–23)
CHLORIDE: 99 meq/L (ref 96–112)
CO2: 19 mEq/L (ref 19–32)
Calcium: 9 mg/dL (ref 8.4–10.5)
Creatinine, Ser: 0.75 mg/dL (ref 0.50–1.10)
Glucose, Bld: 126 mg/dL — ABNORMAL HIGH (ref 70–99)
POTASSIUM: 3.1 meq/L — AB (ref 3.7–5.3)
SODIUM: 137 meq/L (ref 137–147)

## 2013-04-21 LAB — CBC WITH DIFFERENTIAL/PLATELET
BASOS ABS: 0 10*3/uL (ref 0.0–0.1)
Basophils Relative: 0 % (ref 0–1)
EOS PCT: 0 % (ref 0–5)
Eosinophils Absolute: 0 10*3/uL (ref 0.0–0.7)
HEMATOCRIT: 38.4 % (ref 36.0–46.0)
Hemoglobin: 13.3 g/dL (ref 12.0–15.0)
LYMPHS ABS: 1.7 10*3/uL (ref 0.7–4.0)
LYMPHS PCT: 16 % (ref 12–46)
MCH: 32 pg (ref 26.0–34.0)
MCHC: 34.6 g/dL (ref 30.0–36.0)
MCV: 92.3 fL (ref 78.0–100.0)
MONOS PCT: 8 % (ref 3–12)
Monocytes Absolute: 0.9 10*3/uL (ref 0.1–1.0)
NEUTROS ABS: 8.2 10*3/uL — AB (ref 1.7–7.7)
Neutrophils Relative %: 76 % (ref 43–77)
Platelets: 193 10*3/uL (ref 150–400)
RBC: 4.16 MIL/uL (ref 3.87–5.11)
RDW: 13.3 % (ref 11.5–15.5)
WBC: 10.8 10*3/uL — AB (ref 4.0–10.5)

## 2013-04-21 LAB — RAPID STREP SCREEN (MED CTR MEBANE ONLY): STREPTOCOCCUS, GROUP A SCREEN (DIRECT): NEGATIVE

## 2013-04-21 LAB — MAGNESIUM: MAGNESIUM: 1.8 mg/dL (ref 1.5–2.5)

## 2013-04-21 LAB — TROPONIN I: Troponin I: <0.3 ng/mL (ref <0.30)

## 2013-04-21 LAB — D-DIMER, QUANTITATIVE (NOT AT ARMC): D DIMER QUANT: 0.5 ug{FEU}/mL — AB (ref 0.00–0.48)

## 2013-04-21 MED ORDER — IOHEXOL 350 MG/ML SOLN
100.0000 mL | Freq: Once | INTRAVENOUS | Status: AC | PRN
  Administered 2013-04-21: 100 mL via INTRAVENOUS

## 2013-04-21 MED ORDER — IPRATROPIUM-ALBUTEROL 0.5-2.5 (3) MG/3ML IN SOLN
3.0000 mL | Freq: Once | RESPIRATORY_TRACT | Status: AC
  Administered 2013-04-21: 3 mL via RESPIRATORY_TRACT
  Filled 2013-04-21: qty 3

## 2013-04-21 MED ORDER — ALBUTEROL SULFATE HFA 108 (90 BASE) MCG/ACT IN AERS
2.0000 | INHALATION_SPRAY | RESPIRATORY_TRACT | Status: AC
  Administered 2013-04-21: 2 via RESPIRATORY_TRACT
  Filled 2013-04-21: qty 6.7

## 2013-04-21 MED ORDER — DOXYCYCLINE HYCLATE 100 MG PO TABS: 100.0000 mg | ORAL_TABLET | Freq: Two times a day (BID) | ORAL | Status: DC

## 2013-04-21 MED ORDER — PREDNISONE 20 MG PO TABS: 40.0000 mg | ORAL_TABLET | Freq: Every day | ORAL | Status: DC

## 2013-04-21 MED ORDER — ALBUTEROL SULFATE (2.5 MG/3ML) 0.083% IN NEBU
2.5000 mg | INHALATION_SOLUTION | Freq: Once | RESPIRATORY_TRACT | Status: AC
  Administered 2013-04-21: 2.5 mg via RESPIRATORY_TRACT
  Filled 2013-04-21: qty 3

## 2013-04-21 MED ORDER — SODIUM CHLORIDE 0.9 % IV SOLN
INTRAVENOUS | Status: DC
  Administered 2013-04-21: 15:00:00 via INTRAVENOUS

## 2013-04-21 MED ORDER — METHYLPREDNISOLONE SODIUM SUCC 125 MG IJ SOLR
125.0000 mg | Freq: Once | INTRAMUSCULAR | Status: AC
  Administered 2013-04-21: 125 mg via INTRAVENOUS
  Filled 2013-04-21: qty 2

## 2013-04-21 MED ORDER — IPRATROPIUM BROMIDE 0.02 % IN SOLN
1.0000 mg | Freq: Once | RESPIRATORY_TRACT | Status: AC
  Administered 2013-04-21: 1 mg via RESPIRATORY_TRACT
  Filled 2013-04-21: qty 5

## 2013-04-21 MED ORDER — ACETAMINOPHEN 325 MG PO TABS
650.0000 mg | ORAL_TABLET | Freq: Once | ORAL | Status: AC
  Administered 2013-04-21: 650 mg via ORAL
  Filled 2013-04-21: qty 2

## 2013-04-21 MED ORDER — POTASSIUM CHLORIDE 20 MEQ/15ML (10%) PO LIQD
40.0000 meq | Freq: Once | ORAL | Status: AC
  Administered 2013-04-21: 40 meq via ORAL
  Filled 2013-04-21: qty 30

## 2013-04-21 MED ORDER — ALBUTEROL (5 MG/ML) CONTINUOUS INHALATION SOLN
10.0000 mg/h | INHALATION_SOLUTION | Freq: Once | RESPIRATORY_TRACT | Status: AC
  Administered 2013-04-21: 10 mg/h via RESPIRATORY_TRACT
  Filled 2013-04-21: qty 20

## 2013-04-21 NOTE — ED Provider Notes (Signed)
CSN: [id number]     Arrival date & time 04/21/13  1149 History   First MD Initiated Contact with Patient 04/21/13 1243     Chief Complaint  Patient presents with  . Chest Pain  . Cough      HPI Pt was seen at 1315.  Per pt, c/o gradual onset and worsening of persistent cough and SOB for the past 3 days.  Has been associated with sore throat, generalized weakness/fatigue, and home fevers to "100.9." States her "chest hurts" from coughing. Chest pain has been constant, worsens with palpation of the area, movement, coughing and deep breaths. Pt continues to smoke 1.5 packs cigarettes/day.  Denies palpitations, no back pain, no abd pain, no N/V/D, no rash.     Past Medical History  Diagnosis Date  . COPD (chronic obstructive pulmonary disease)   . Endometriosis   . Chronic pelvic pain in female   . Asthma   . Syncope   . Suicide attempt 2008    by phenergan overdose  . Polysubstance abuse     marijuana, cocaine, benzos   Past Surgical History  Procedure Laterality Date  . Abdominal hysterectomy    . Cholecystectomy     History reviewed. No pertinent family history. History  Substance Use Topics  . Smoking status: Current Every Day Smoker -- 0.50 packs/day    Types: Cigarettes  . Smokeless tobacco: Not on file  . Alcohol Use: No    Review of Systems ROS: Statement: All systems negative except as marked or noted in the HPI; Constitutional: +fever and chills, generalized weakness/fatigue.. ; ; Eyes: Negative for eye pain, redness and discharge. ; ; ENMT: Negative for ear pain, hoarseness, nasal congestion, sinus pressure and +sore throat. ; ; Cardiovascular: +CP, SOB. Negative for palpitations, diaphoresis, and peripheral edema. ; ; Respiratory: +cough. Negative for wheezing and stridor. ; ; Gastrointestinal: Negative for nausea, vomiting, diarrhea, abdominal pain, blood in stool, hematemesis, jaundice and rectal bleeding. . ; ; Genitourinary: Negative for dysuria, flank pain and  hematuria. ; ; Musculoskeletal: Negative for back pain and neck pain. Negative for swelling and trauma.; ; Skin: Negative for pruritus, rash, abrasions, blisters, bruising and skin lesion.; ; Neuro: Negative for headache, lightheadedness and neck stiffness. Negative for altered level of consciousness , altered mental status, extremity weakness, paresthesias, involuntary movement, seizure and syncope.       Allergies  Asa  Home Medications   Current Outpatient Rx  Name  Route  Sig  Dispense  Refill  . guaiFENesin (MUCINEX) 600 MG 12 hr tablet   Oral   Take 1,200 mg by mouth 2 (two) times daily as needed for cough or to loosen phlegm.         . Pseudoeph-Doxylamine-DM-APAP (NYQUIL PO)   Oral   Take 30 mLs by mouth every 6 (six) hours as needed (cough/cold).          BP 113/75  Pulse 108  Temp(Src) 99.2 F (37.3 C) (Oral)  Resp 18  Ht 4\' 11"  (1.499 m)  Wt 160 lb (72.576 kg)  BMI 32.30 kg/m2  SpO2 96% Physical Exam 1320: Physical examination:  Nursing notes reviewed; Vital signs and O2 SAT reviewed;  Constitutional: Well developed, Well nourished, Well hydrated, In no acute distress; Head:  Normocephalic, atraumatic; Eyes: EOMI, PERRL, No scleral icterus; ENMT: TM's clear bilat. +edemetous nasal turbinates bilat with clear rhinorrhea. Mouth and pharynx without lesions. No tonsillar exudates. No intra-oral edema. No submandibular or sublingual edema. No hoarse voice, no  drooling, no stridor. No pain with manipulation of larynx. No trismus. Mouth and pharynx normal, Mucous membranes moist; Neck: Supple, Full range of motion, No lymphadenopathy; Cardiovascular: Regular rate and rhythm, No murmur, rub, or gallop; Respiratory: Breath sounds coarse & equal bilaterally, scattered exp wheezes. No audible wheezing. +moist cough during exam. Speaking full sentences with ease, Normal respiratory effort/excursion; Chest: Nontender, Movement normal; Abdomen: Soft, Nontender, Nondistended, Normal  bowel sounds; Genitourinary: No CVA tenderness; Extremities: Pulses normal, No tenderness, No edema, No calf edema or asymmetry.; Neuro: AA&Ox3, Major CN grossly intact.  Speech clear. No gross focal motor or sensory deficits in extremities.; Skin: Color normal, Warm, Dry.   ED Course  Procedures     EKG Interpretation   Date/Time:  Tuesday April 21 2013 13:42:10 EDT Ventricular Rate:  92 PR Interval:  122 QRS Duration: 88 QT Interval:  356 QTC Calculation: 440 R Axis:   46 Text Interpretation:  Normal sinus rhythm Artifact Normal ECG When  compared with ECG of 21-Apr-2013 12:04, T wave inversion no longer evident  in Lateral leads QT has shortened When compared with ECG of 07/17/2011 and  11/10/2008, No significant change was found Confirmed by [facility]  MD,  Nunzio Cory [phone]) on 04/21/2013 1:57:27 PM        MDM  MDM Reviewed: previous chart, nursing note and vitals Reviewed previous: labs and ECG Interpretation: labs, ECG and x-ray    Results for orders placed during the hospital encounter of 04/21/13  RAPID STREP SCREEN      Result Value Ref Range   Streptococcus, Group A Screen (Direct) NEGATIVE  NEGATIVE  CBC WITH DIFFERENTIAL      Result Value Ref Range   WBC 10.8 (*) 4.0 - 10.5 K/uL   RBC 4.16  3.87 - 5.11 MIL/uL   Hemoglobin 13.3  12.0 - 15.0 g/dL   HCT 38.4  36.0 - 46.0 %   MCV 92.3  78.0 - 100.0 fL   MCH 32.0  26.0 - 34.0 pg   MCHC 34.6  30.0 - 36.0 g/dL   RDW 13.3  11.5 - 15.5 %   Platelets 193  150 - 400 K/uL   Neutrophils Relative % 76  43 - 77 %   Lymphocytes Relative 16  12 - 46 %   Monocytes Relative 8  3 - 12 %   Eosinophils Relative 0  0 - 5 %   Basophils Relative 0  0 - 1 %   Neutro Abs 8.2 (*) 1.7 - 7.7 K/uL   Lymphs Abs 1.7  0.7 - 4.0 K/uL   Monocytes Absolute 0.9  0.1 - 1.0 K/uL   Eosinophils Absolute 0.0  0.0 - 0.7 K/uL   Basophils Absolute 0.0  0.0 - 0.1 K/uL   WBC Morphology ATYPICAL LYMPHOCYTES     Smear Review LARGE PLATELETS  PRESENT    BASIC METABOLIC PANEL      Result Value Ref Range   Sodium 137  137 - 147 mEq/L   Potassium 3.1 (*) 3.7 - 5.3 mEq/L   Chloride 99  96 - 112 mEq/L   CO2 19  19 - 32 mEq/L   Glucose, Bld 126 (*) 70 - 99 mg/dL   BUN 9  6 - 23 mg/dL   Creatinine, Ser 0.75  0.50 - 1.10 mg/dL   Calcium 9.0  8.4 - 10.5 mg/dL   GFR calc non Af Amer >90  >90 mL/min   GFR calc Af Amer >90  >90 mL/min  TROPONIN I  Result Value Ref Range   Troponin I <0.30  <0.30 ng/mL  D-DIMER, QUANTITATIVE      Result Value Ref Range   D-Dimer, Quant 0.50 (*) 0.00 - 0.48 ug/mL-FEU  MAGNESIUM      Result Value Ref Range   Magnesium 1.8  1.5 - 2.5 mg/dL   Dg Chest 2 View 04/21/2013   CLINICAL DATA:  History of COPD with chest pain for the preceding 3 days ; history of tobacco use  EXAM: CHEST  2 VIEW  COMPARISON:  Portable chest x-ray of November 10, 2008  FINDINGS: The lungs are adequately inflated. There is no focal infiltrate. The cardiac silhouette is normal in size. The pulmonary vascularity is not engorged. The mediastinum is normal in width. There is no pleural effusion or pneumothorax or pneumomediastinum. The observed portions of the bony thorax appear normal.  IMPRESSION: There is no evidence of pneumonia nor CHF or other active cardiopulmonary disease.   Electronically Signed   By: David  Martinique   On: 04/21/2013 13:13   Ct Angio Chest Pe W/cm &/or Wo Cm 04/21/2013   CLINICAL DATA:  Chest pain, productive cough, and fever. Shortness of breath. Elevated D-dimer.  EXAM: CT ANGIOGRAPHY CHEST WITH CONTRAST  TECHNIQUE: Multidetector CT imaging of the chest was performed using the standard protocol during bolus administration of intravenous contrast. Multiplanar CT image reconstructions and MIPs were obtained to evaluate the vascular anatomy.  CONTRAST:  138mL OMNIPAQUE IOHEXOL 350 MG/ML SOLN  COMPARISON:  Chest x-ray dated 04/21/2013 and chest CT dated 11/11/2008  FINDINGS: There are no pulmonary emboli. The patient  has prominent peribronchial thickening with multiple partially obstructed bronchi in the left lower lobe and less severe peribronchial thickening and bronchial obstruction in the right lower lobe. There is what is most likely reactive bilateral hilar adenopathy. No mediastinal adenopathy. Heart size is normal. RV/ LV ratio is normal.  No consolidation or pleural effusions. There is slight linear atelectasis at the bases. There is slight bronchiectasis in the left lower lobe.  No acute osseous abnormality. Accentuation of the thoracic kyphosis.  Review of the MIP images confirms the above findings.  IMPRESSION: 1. No pulmonary emboli. 2. Severe bronchitis with bronchiectasis at the left lung base, slight bibasilar atelectasis, and reactive bilateral hilar adenopathy. Mucus plugs are seen in several lower lobe bronchi.   Electronically Signed   By: Rozetta Nunnery M.D.   On: 04/21/2013 14:49    1500:  Feels somewhat better after first neb, with coughing decreased from arrival; pt is requesting another neb. Lungs continue coarse bilat, resps without distress, speaking full sentences, Sats 96% R/A. No PE on CT-A chest. Will dose IV solumedrol and start hour long neb.   1730:  Feels better after hour long neb and steroids. Pt states she wants to go home now.  NAD, resps easy, speaking full sentences with ease, lungs CTA bilat, Sats 99% R/A. Dx and testing d/w pt.  Questions answered.  Verb understanding, agreeable to d/c home with outpt f/u.   Alfonzo Feller, DO 04/24/13 1354

## 2013-04-21 NOTE — ED Notes (Addendum)
Chest pain, cough,fever. Green sputum.  Feels faint.   Sob  Chest and abd appear mottled

## 2013-04-21 NOTE — Discharge Instructions (Signed)
°Emergency Department Resource Guide °1) Find a Doctor and Pay Out of Pocket °Although you won't have to find out who is covered by your insurance plan, it is a good idea to ask around and get recommendations. You will then need to call the office and see if the doctor you have chosen will accept you as a new patient and what types of options they offer for patients who are self-pay. Some doctors offer discounts or will set up payment plans for their patients who do not have insurance, but you will need to ask so you aren't surprised when you get to your appointment. ° °2) Contact Your Local Health Department °Not all health departments have doctors that can see patients for sick visits, but many do, so it is worth a call to see if yours does. If you don't know where your local health department is, you can check in your phone book. The CDC also has a tool to help you locate your state's health department, and many state websites also have listings of all of their local health departments. ° °3) Find a Walk-in Clinic °If your illness is not likely to be very severe or complicated, you may want to try a walk in clinic. These are popping up all over the country in pharmacies, drugstores, and shopping centers. They're usually staffed by nurse practitioners or physician assistants that have been trained to treat common illnesses and complaints. They're usually fairly quick and inexpensive. However, if you have serious medical issues or chronic medical problems, these are probably not your best option. ° °No Primary Care Doctor: °- Call [facility] at  832-8000 - they can help you locate a primary care doctor that  accepts your insurance, provides certain services, etc. °- Physician Referral Service- 1-800-533-3463 ° °Chronic Pain Problems: °Organization         Address  Phone   Notes  °[facility]  ([phone]) [phone] Patients need to be referred by their primary care doctor.  ° °Medication  Assistance: °Organization         Address  Phone   Notes  °[place] [facility] [address] °[place], [place] [address] (336) 641-8030 --Must be a resident of [place] °-- Must have NO insurance coverage whatsoever (no Medicaid/ Medicare, etc.) °-- The pt. MUST have a primary care doctor that directs their care regularly and follows them in the community °  °[facility]  (866) 331-1348   °[employer]  (888) 892-1162   ° °Agencies that provide inexpensive medical care: °Organization         Address  Phone   Notes  °[facility]  (336) 832-8035   °[facility]    (336) 832-7272   °[facility] [address] °[place], [place] [address] (336) 832-4777   °[facility] [address], °[place] (336) 271-4999   °[facility]    (336) 373-0678   °[facility]    (336) 272-1050   °[facility] ° [address], [place] Phone:  (336) 832-4444, Fax:  (336) 832-4440 Hours of Operation:  9 am - 6 pm, M-F.  Also accepts Medicaid/Medicare and self-pay.  °[facility] ° [address], [place] Phone: (336) [phone], Fax: (336) [phone]. Hours of Operation:  8:30 am - 5:30 pm, M-F.  Also accepts Medicaid and self-pay.  °[facility] [place] [address], [place] Phone: (336) 878-6027   °[facility] [address], [place], [place] (336)723-1848, Ext. 123 Mondays & Thursdays: 7-9 AM.  First 15 patients are seen on a first come, first serve basis. °  ° °Medicaid-accepting [place] Providers: ° °Organization         Address  Phone   Notes  °[facility] [address], [place] (336) 641-2100 Also accepts self-pay patients.  °[facility] [address], [place] ° (336) 856-9996   °[facility] [address], [place]  (336) 288-8857   °[facility] [address] [place] [address], [place] (336) 299-7000   °Veita Bland [address], [place]  ° (336) 373-1557 Only accepts [place] Access Medicaid patients after they have their name applied to their card.  ° °Self-Pay (no insurance) in [place]: ° °Organization         Address  Phone   Notes  °Sickle Cell Patients, [facility] [address], [place] (336) 832-1970   °[facility] [address], [place] (336) 832-4400   °[facility] ° [address], [place] (336) 992-4800   °[facility]/Dr. Osei-Bonsu ° [address] [place] [address], [place] or [address], [place] (336) [phone] Phone number for both [place] and [place] locations is the same.  °[facility] [address], [place] (336) 299-0000   °[facility] [address] [place] [address], [place] or [address] (336) 852-7530 °(336) 878-2260   °[facility] [address], [place] (336) 350-1642, phone; (336) 294-5005, fax Sees patients 1st and 3rd Saturday of every month.  Must not qualify for public or private insurance (i.e. Medicaid, Medicare, [place] Health Choice, Veterans' Benefits) • Household income should be no more than 200% of the poverty level •The clinic cannot treat you if you are pregnant or think you are pregnant • Sexually transmitted diseases are not treated at the clinic.  ° ° °Dental Care: °Organization         Address  Phone  Notes  °[place] [facility] [address], [place] (336) 641-6152 Accepts children up to age 21 who are enrolled in Medicaid or [place] Health Choice; pregnant women with a Medicaid card; and children who have applied for Medicaid or [place] Health Choice, but were declined, whose parents can pay a reduced fee at time of service.  °[place]  [facility] [place]  [address], [place] (336) 641-7733 Accepts children up to age 21 who are enrolled in Medicaid or [place] Health Choice; pregnant women with a Medicaid card; and children who have applied for Medicaid or [place] Health Choice, but were declined, whose parents can pay a reduced fee at time of service.  °[facility] ° [address], [place] (336) 641-4533 Patients are seen by appointment only. Walk-ins are not accepted. [facility] will see patients 18 years of age and older. °Monday - Tuesday (8am-5pm) °Most Wednesdays (8:30-5pm) °$30 per visit, cash only  °[facility] ° [address], [place] (336) 641-4533 Patients are seen by appointment only. Walk-ins are not accepted. [facility] will see patients 18 years of age and older. °One   Wednesday Evening (Monthly: Volunteer Based).  $30 per visit, cash only  °[facility]  (919) 537-3737 for adults; Children under age 4, call Graduate Pediatric Dentistry at ([phone]) [phone]. Children aged 4-14, please call (919) [phone] to request a pediatric application. ° Dental services are provided in all areas of dental care including fillings, crowns and bridges, complete and partial dentures, implants, gum treatment, root canals, and extractions. Preventive care is also provided. Treatment is provided to both adults and children. °Patients are selected via a lottery and there is often a waiting list. °  °[facility] [address], °[place] ° (336) 763-8833 [email or web address] °  °[facility] [address], [place], [place] (336)723-1848, Ext. 123 Second and Fourth Thursday of each month, opens at 6:30 AM; Clinic ends at 9 AM.  Patients are seen on a first-come first-served basis, and a limited number are seen during each clinic.  ° °[facility] ° [address], [place], [place] (336) [phone]    Eligibility Requirements °You must have lived in [place], [place], or [place] counties for at least the last three months. °  You cannot be eligible for state or federal sponsored healthcare insurance, including Veterans Administration, Medicaid, or Medicare. °  You generally cannot be eligible for healthcare insurance through your employer.  °  How to apply: °Eligibility screenings are held every Tuesday and Wednesday afternoon from 1:00 pm until 4:00 pm. You do not need an appointment for the interview!  °[facility] [address], [place], [place] 336-631-2330   °[facility] [facility]  336-342-8273   °[place] [facility]  336-703-3100   °[facility]  336-570-6415   ° °Behavioral Health Resources in the Community: °Intensive Outpatient Programs °Organization         Address  Phone  Notes  °[facility] [address], [place], [place] 336-878-6098   °[facility] [address], [place], [place] 336-832-9800   °[facility] [address], [place], [place] ° 336-882-2125   °[facility] [address],  °[place], [place] 1-800-853-5163 or 336-641-4981   °Substance Abuse Resources °Organization         Address  Phone  Notes  °[facility]  336-882-2125   °[facility]  336-784-9470   °[facility]  336-285-9073   °[facility]  336-845-3988   °Residential & Outpatient Substance Abuse Program  1-800-659-3381   °Psychological Services °Organization         Address  Phone  Notes  ° Health  336- 832-9600   °[facility]  336- 378-7881   °[facility] [address], [place] 1-800-853-5163 or 336-641-4981   ° °Mobile Crisis Teams °Organization         Address  Phone  Notes  °[facility], Mobile Crisis Care Unit  1-877-626-1772   °Assertive °[facility] ° [address]  [place], [place] 336-834-9664   °Sharon DeEsch [address] °[place] 336-554-5454   ° °Self-Help/Support Groups °Organization         Address  Phone             Notes  °[facility]  - variety of support groups  336- 373-1402 Call for more information  °Narcotics Anonymous (NA), [facility] [address], °[place]   2 meetings at this location  ° °  Residential Treatment Programs °Organization         Address  Phone  Notes  °[facility] [address],    °[place]  1-866-801-8205   °[facility] ° [address], [place], [place] 704-293-8524   °[facility] [address], [place] 336-845-3988 Admissions: 8am-3pm M-F  °[facility] [address]    °[place], [place] 336-841-1104   °[facility] [address], [place], [place] 336-379-7146   °[facility] [address]  °[place], [place] 336-285-9073   °[facility] - Intensive Outpatient [address], [place], [place] 336-852-3033   °[facility] [address]  °[place], [place] 1-877-615-2722 or 336-784-9470   °[facility] ([facility] [address] [place], [place] 336-227-7417 Accepts Medicaid  °[facility] [address]  °[place] 1-800-659-3381 Substance Abuse/Addiction Treatment  ° °[place] Behavioral Health Resources °Organization         Address  Phone  Notes  °[facility]  (888) 581-9988   °Julie Brannon, PhD [address] [place], [place]   (336) 349-5553 or (336) 951-0000   °[facility]   [address] °[place], [place] (336) 349-4454   °[facility] [address], [place], [place] (336) 342-8316 Insurance/Medicaid/sponsorship through [facility]  °[facility] [address]                                    [place], [place] (336) 342-8316 Therapy/tele-psych/case    °[facility] [address]  ° [place], [place] (336) 349-2233    °Dr. Arfeen  (336) 349-4544   °[facility] [place]  [employer] [place] [facility] 1) [address], [place] °2) [address], [place] °3)  [address], [place] (336) 349-3220 °(336) 342-7768 ° °(336) 342-8140   °[place] Child [facility] (336) 342-1394 or (336) 342-3537 (After Hours)    ° ° °Take the prescriptions as directed.  Use your albuterol inhaler (2 to 4 puffs) every 4 hours for the next 7 days, then as needed for cough, wheezing, or shortness of breath. Try to stop smoking. Call your regular medical doctor tomorrow morning to schedule a follow up appointment within the next 3 days.  Return to the Emergency Department immediately sooner if worsening.  ° °

## 2013-04-23 LAB — CULTURE, GROUP A STREP

## 2013-04-23 NOTE — Care Management Note (Signed)
Received a telephone call from Plato, patient's daughter. She had gone to visit her mother and found that she had not filled her prescriptions given on March 31st with her visit to the ED, because she could not afford this. Hoyle Sauer had called the hospital and was given CM's number for assistance with medication. The two drugs at [facility] were more than $50. Suggested they try one or two other pharmacies, which they did, but the medications were more than $50at [facility] and [place], also. Patient was assisted with these medications through the [facility]. Patient's MATCH form is left at the front desk in an envelope with her name on it, for her to pick up today. She is aware that this can only be done once a year.

## 2014-02-23 ENCOUNTER — Encounter (HOSPITAL_COMMUNITY): Payer: Self-pay | Admitting: Emergency Medicine

## 2014-02-23 ENCOUNTER — Emergency Department (HOSPITAL_COMMUNITY)
Admission: EM | Admit: 2014-02-23 | Discharge: 2014-02-23 | Disposition: A | Discharge status: Home / Self Care | Payer: Self-pay | Attending: Emergency Medicine | Admitting: Emergency Medicine

## 2014-02-23 ENCOUNTER — Emergency Department (HOSPITAL_COMMUNITY): Payer: Self-pay

## 2014-02-23 DIAGNOSIS — R0789 Other chest pain: Secondary | ICD-10-CM | POA: Insufficient documentation

## 2014-02-23 DIAGNOSIS — Z7952 Long term (current) use of systemic steroids: Secondary | ICD-10-CM | POA: Insufficient documentation

## 2014-02-23 DIAGNOSIS — Z87448 Personal history of other diseases of urinary system: Secondary | ICD-10-CM | POA: Insufficient documentation

## 2014-02-23 DIAGNOSIS — Z720 Tobacco use: Secondary | ICD-10-CM | POA: Insufficient documentation

## 2014-02-23 DIAGNOSIS — Z792 Long term (current) use of antibiotics: Secondary | ICD-10-CM | POA: Insufficient documentation

## 2014-02-23 DIAGNOSIS — J441 Chronic obstructive pulmonary disease with (acute) exacerbation: Secondary | ICD-10-CM | POA: Insufficient documentation

## 2014-02-23 LAB — CBC WITH DIFFERENTIAL/PLATELET
BASOS ABS: 0.1 10*3/uL (ref 0.0–0.1)
BASOS PCT: 1 % (ref 0–1)
Eosinophils Absolute: 0.3 10*3/uL (ref 0.0–0.7)
Eosinophils Relative: 3 % (ref 0–5)
HCT: 42.5 % (ref 36.0–46.0)
Hemoglobin: 14.4 g/dL (ref 12.0–15.0)
Lymphocytes Relative: 33 % (ref 12–46)
Lymphs Abs: 3.4 10*3/uL (ref 0.7–4.0)
MCH: 32.3 pg (ref 26.0–34.0)
MCHC: 33.9 g/dL (ref 30.0–36.0)
MCV: 95.3 fL (ref 78.0–100.0)
MONO ABS: 0.7 10*3/uL (ref 0.1–1.0)
Monocytes Relative: 6 % (ref 3–12)
Neutro Abs: 5.9 10*3/uL (ref 1.7–7.7)
Neutrophils Relative %: 57 % (ref 43–77)
PLATELETS: 283 10*3/uL (ref 150–400)
RBC: 4.46 MIL/uL (ref 3.87–5.11)
RDW: 12.9 % (ref 11.5–15.5)
WBC: 10.4 10*3/uL (ref 4.0–10.5)

## 2014-02-23 LAB — COMPREHENSIVE METABOLIC PANEL
ALT: 18 U/L (ref 0–35)
AST: 20 U/L (ref 0–37)
Albumin: 3.9 g/dL (ref 3.5–5.2)
Alkaline Phosphatase: 71 U/L (ref 39–117)
Anion gap: 6 (ref 5–15)
BUN: 12 mg/dL (ref 6–23)
CHLORIDE: 111 mmol/L (ref 96–112)
CO2: 23 mmol/L (ref 19–32)
CREATININE: 0.78 mg/dL (ref 0.50–1.10)
Calcium: 9.2 mg/dL (ref 8.4–10.5)
GFR calc Af Amer: >90 mL/min (ref >90)
GFR calc non Af Amer: >90 mL/min (ref >90)
Glucose, Bld: 98 mg/dL (ref 70–99)
Potassium: 3.5 mmol/L (ref 3.5–5.1)
Sodium: 140 mmol/L (ref 135–145)
TOTAL PROTEIN: 6.8 g/dL (ref 6.0–8.3)
Total Bilirubin: 0.6 mg/dL (ref 0.3–1.2)

## 2014-02-23 LAB — TROPONIN I: Troponin I: <0.03 ng/mL (ref <0.031)

## 2014-02-23 MED ORDER — PREDNISONE 50 MG PO TABS
60.0000 mg | ORAL_TABLET | Freq: Once | ORAL | Status: AC
  Administered 2014-02-23: 60 mg via ORAL
  Filled 2014-02-23 (×2): qty 1

## 2014-02-23 MED ORDER — ALBUTEROL SULFATE HFA 108 (90 BASE) MCG/ACT IN AERS
2.0000 | INHALATION_SPRAY | Freq: Once | RESPIRATORY_TRACT | Status: AC
  Administered 2014-02-23: 2 via RESPIRATORY_TRACT
  Filled 2014-02-23: qty 6.7

## 2014-02-23 MED ORDER — HYDROCODONE-ACETAMINOPHEN 5-325 MG PO TABS: 1.0000 | ORAL_TABLET | ORAL | Status: DC | PRN

## 2014-02-23 MED ORDER — IPRATROPIUM-ALBUTEROL 0.5-2.5 (3) MG/3ML IN SOLN
3.0000 mL | Freq: Once | RESPIRATORY_TRACT | Status: AC
Start: 2014-02-23 — End: 2014-02-23
  Administered 2014-02-23: 3 mL via RESPIRATORY_TRACT
  Filled 2014-02-23: qty 3

## 2014-02-23 MED ORDER — PREDNISONE 20 MG PO TABS: 60.0000 mg | ORAL_TABLET | Freq: Every day | ORAL | Status: DC

## 2014-02-23 MED ORDER — METHYLPREDNISOLONE SODIUM SUCC 125 MG IJ SOLR: 125.0000 mg | Freq: Once | INTRAMUSCULAR | Status: DC

## 2014-02-23 MED ORDER — IPRATROPIUM-ALBUTEROL 0.5-2.5 (3) MG/3ML IN SOLN
3.0000 mL | Freq: Once | RESPIRATORY_TRACT | Status: AC
  Administered 2014-02-23: 3 mL via RESPIRATORY_TRACT
  Filled 2014-02-23: qty 3

## 2014-02-23 MED ORDER — HYDROCODONE-ACETAMINOPHEN 5-325 MG PO TABS
2.0000 | ORAL_TABLET | Freq: Once | ORAL | Status: AC
  Administered 2014-02-23: 2 via ORAL
  Filled 2014-02-23: qty 2

## 2014-02-23 NOTE — ED Notes (Signed)
Respiratory paged about treatment and inhaler needed

## 2014-02-23 NOTE — ED Provider Notes (Signed)
This chart was scribed for [place], DO by Randa Evens, ED Scribe. This patient was seen in room APA02/APA02 and the patient's care was started at 5:38 PM.   TIME SEEN: 5:38 PM   CHIEF COMPLAINT: Chest Pain  HPI: HPI Comments: Pamela Livingston is a 49 y.o. female with history of COPD, tobacco use who presents to the Emergency Department complaining of constant left sided chest pain onset 1 day prior. Pt described the pain as a tightness in her chest. Pt states she has associated SOB and productive cough but states this is normal for her. Pt states that the pain is non radiating. Pt states that the chest pain is worse with palpation. States her shortness of breath is worse with exertion. No pleuritic chest pain. Pt denies nausea, vomiting, dizziness or diaphoresis. Pt denies any recent traveling, hospitalizations, surgery, trauma, fractures. Pt denies HX of DVT or PE's. Pt denied being on exogenous estrogen.  Has had a cough with brown/green sputum production. No fever.  Pt is also complaining of bilateral anterior leg pain onset several weeks prior. Pt states that there is a "knot" on the anterior right leg. Pt states that she has right ankle pain as well that's worse with bearing weight. No calf swelling or tenderness. No history of injury.       ROS: See HPI Constitutional: no fever  Eyes: no drainage  ENT: no runny nose   Cardiovascular:  Chest pain Resp: SOB and Cough GI: no vomiting GU: no dysuria Integumentary: no rash  Allergy: no hives  Musculoskeletal: no leg swelling  Neurological: no slurred speech ROS otherwise negative  PAST MEDICAL HISTORY/PAST SURGICAL HISTORY:  Past Medical History  Diagnosis Date  . COPD (chronic obstructive pulmonary disease)   . Endometriosis   . Chronic pelvic pain in female   . Asthma   . Syncope   . Suicide attempt 2008    by phenergan overdose  . Polysubstance abuse     marijuana, cocaine, benzos    MEDICATIONS:  Prior to  Admission medications   Medication Sig Start Date End Date Taking? Authorizing Provider  doxycycline (VIBRA-TABS) 100 MG tablet Take 1 tablet (100 mg total) by mouth 2 (two) times daily. 04/21/13   Francine Graven, DO  guaiFENesin (MUCINEX) 600 MG 12 hr tablet Take 1,200 mg by mouth 2 (two) times daily as needed for cough or to loosen phlegm.    Historical Provider, MD  predniSONE (DELTASONE) 20 MG tablet Take 2 tablets (40 mg total) by mouth daily. 04/21/13   Francine Graven, DO  Pseudoeph-Doxylamine-DM-APAP (NYQUIL PO) Take 30 mLs by mouth every 6 (six) hours as needed (cough/cold).    Historical Provider, MD    ALLERGIES:  Allergies  Allergen Reactions  . Asa [Aspirin] Anaphylaxis and Swelling    SOCIAL HISTORY:  History  Substance Use Topics  . Smoking status: Current Every Day Smoker -- 0.50 packs/day    Types: Cigarettes  . Smokeless tobacco: Not on file  . Alcohol Use: No    FAMILY HISTORY: History reviewed. No pertinent family history.  EXAM: BP 100/81 mmHg  Pulse 89  Temp(Src) 98.7 F (37.1 C) (Oral)  Resp 16  Ht 4\' 11"  (1.499 m)  Wt 175 lb (79.379 kg)  BMI 35.33 kg/m2  SpO2 100%   CONSTITUTIONAL: Alert and oriented and responds appropriately to questions. Well-appearing; well-nourished HEAD: Normocephalic EYES: Conjunctivae clear, PERRL ENT: normal nose; no rhinorrhea; moist mucous membranes; pharynx without lesions noted NECK: Supple, no meningismus,  no LAD  CARD: RRR; S1 and S2 appreciated; no murmurs, no clicks, no rubs, no gallops; chest pain reproducible with palpation of her chest wall RESP: Normal chest excursion without splinting or tachypnea; breath sounds equal bilaterally, Diffuse expiratory wheezing, no rhonchi, no rales, no hypoxia, no respiratory distress, speaking full sentences ABD/GI: Normal bowel sounds; non-distended; soft, non-tender, no rebound, no guarding BACK:  The back appears normal and is non-tender to palpation, there is no CVA  tenderness EXT: Normal ROM in all joints; non-tender to palpation; no edema; normal capillary refill; no cyanosis; No calf tenderness or swelling    SKIN: Normal color for age and race; warm NEURO: Moves all extremities equally PSYCH: The patient's mood and manner are appropriate. Grooming and personal hygiene are appropriate.  MEDICAL DECISION MAKING: Patient here with chest pain and shortness of breath that been constant for the past 24 hours. She is wheezing on exam. Suspect this may be secondary to her COPD, chest wall pain. Chest x-ray shows bronchitic changes but no infiltrate. We'll obtain cardiac labs. EKG shows no significant ischemic change since prior EKGs. She has no risk factors for pulmonary embolus.    ED PROGRESS: Shortness of breath has improved with DuoNeb's. Her lungs are now clear to auscultation. Will discharge home on steroid burst. She has been better with Vicodin. Suspect chest wall pain as her pain is reproducible with palpation. Given she has had symptoms for 24 hours and have been constant with a normal troponin doubt ACS. I do not feel she needs serial sets of enzymes at this time. She has no risk factors for ACS other than tobacco use. We'll have her follow-up with her primary care physician. We'll give outpatient follow-up information.     EKG Interpretation  Date/Time:  Tuesday February 23 2014 15:57:21 EST Ventricular Rate:  88 PR Interval:  122 QRS Duration: 84 QT Interval:  344 QTC Calculation: 416 R Axis:   35 Text Interpretation:  Normal sinus rhythm Nonspecific T wave abnormality No significant change since last tracing Reconfirmed by Jenese Mischke,  DO, Adiel Mcnamara [phone]) on 02/23/2014 6:15:59 PM         I personally performed the services described in this documentation, which was scribed in my presence. The recorded information has been reviewed and is accurate.   [place], DO 02/23/14 2046

## 2014-02-23 NOTE — ED Notes (Signed)
RT at bedside.

## 2014-02-23 NOTE — Discharge Instructions (Signed)
Chest Wall Pain Chest wall pain is pain in or around the bones and muscles of your chest. It may take up to 6 weeks to get better. It may take longer if you must stay physically active in your work and activities.  CAUSES  Chest wall pain may happen on its own. However, it may be caused by:  A viral illness like the flu.  Injury.  Coughing.  Exercise.  Arthritis.  Fibromyalgia.  Shingles. HOME CARE INSTRUCTIONS   Avoid overtiring physical activity. Try not to strain or perform activities that cause pain. This includes any activities using your chest or your abdominal and side muscles, especially if heavy weights are used.  Put ice on the sore area.  Put ice in a plastic bag.  Place a towel between your skin and the bag.  Leave the ice on for 15-20 minutes per hour while awake for the first 2 days.  Only take over-the-counter or prescription medicines for pain, discomfort, or fever as directed by your caregiver. SEEK IMMEDIATE MEDICAL CARE IF:   Your pain increases, or you are very uncomfortable.  You have a fever.  Your chest pain becomes worse.  You have new, unexplained symptoms.  You have nausea or vomiting.  You feel sweaty or lightheaded.  You have a cough with phlegm (sputum), or you cough up blood. MAKE SURE YOU:   Understand these instructions.  Will watch your condition.  Will get help right away if you are not doing well or get worse. Document Released: 01/08/2005 Document Revised: 04/02/2011 Document Reviewed: 09/04/2010 [facility] Patient Information 2015 [place], [place]. This information is not intended to replace advice given to you by your health care provider. Make sure you discuss any questions you have with your health care provider.  Chronic Obstructive Pulmonary Disease Exacerbation Chronic obstructive pulmonary disease (COPD) is a common lung condition in which airflow from the lungs is limited. COPD is a general term that can be used to  describe many different lung problems that limit airflow, including chronic bronchitis and emphysema. COPD exacerbations are episodes when breathing symptoms become much worse and require extra treatment. Without treatment, COPD exacerbations can be life threatening, and frequent COPD exacerbations can cause further damage to your lungs. CAUSES   Respiratory infections.   Exposure to smoke.   Exposure to air pollution, chemical fumes, or dust. Sometimes there is no apparent cause or trigger. RISK FACTORS  Smoking cigarettes.  Older age.  Frequent prior COPD exacerbations. SIGNS AND SYMPTOMS   Increased coughing.   Increased thick spit (sputum) production.   Increased wheezing.   Increased shortness of breath.   Rapid breathing.   Chest tightness. DIAGNOSIS  Your medical history, a physical exam, and tests will help your health care provider make a diagnosis. Tests may include:  A chest X-ray.  Basic lab tests.  Sputum testing.  An arterial blood gas test. TREATMENT  Depending on the severity of your COPD exacerbation, you may need to be admitted to a hospital for treatment. Some of the treatments commonly used to treat COPD exacerbations are:   Antibiotic medicines.   Bronchodilators. These are drugs that expand the air passages. They may be given with an inhaler or nebulizer. Spacer devices may be needed to help improve drug delivery.  Corticosteroid medicines.  Supplemental oxygen therapy.  HOME CARE INSTRUCTIONS   Do not smoke. Quitting smoking is very important to prevent COPD from getting worse and exacerbations from happening as often.  Avoid exposure to  all substances that irritate the airway, especially to tobacco smoke.   If you were prescribed an antibiotic medicine, finish it all even if you start to feel better.  Take all medicines as directed by your health care provider.It is important to use correct technique with inhaled  medicines.  Drink enough fluids to keep your urine clear or pale yellow (unless you have a medical condition that requires fluid restriction).  Use a cool mist vaporizer. This makes it easier to clear your chest when you cough.   If you have a home nebulizer and oxygen, continue to use them as directed.   Maintain all necessary vaccinations to prevent infections.   Exercise regularly.   Eat a healthy diet.   Keep all follow-up appointments as directed by your health care provider. SEEK IMMEDIATE MEDICAL CARE IF:  You have worsening shortness of breath.   You have trouble talking.   You have severe chest pain.  You have blood in your sputum.  You have a fever.  You have weakness, vomit repeatedly, or faint.   You feel confused.   You continue to get worse. MAKE SURE YOU:   Understand these instructions.  Will watch your condition.  Will get help right away if you are not doing well or get worse. Document Released: 11/05/2006 Document Revised: 05/25/2013 Document Reviewed: 09/12/2012 [facility] Patient Information 2015 [place], [place]. This information is not intended to replace advice given to you by your health care provider. Make sure you discuss any questions you have with your health care provider.   Smoking Cessation Quitting smoking is important to your health and has many advantages. However, it is not always easy to quit since nicotine is a very addictive drug. Oftentimes, people try 3 times or more before being able to quit. This document explains the best ways for you to prepare to quit smoking. Quitting takes hard work and a lot of effort, but you can do it. ADVANTAGES OF QUITTING SMOKING  You will live longer, feel better, and live better.  Your body will feel the impact of quitting smoking almost immediately.  Within 20 minutes, blood pressure decreases. Your pulse returns to its normal level.  After 8 hours, carbon monoxide levels in the blood  return to normal. Your oxygen level increases.  After 24 hours, the chance of having a heart attack starts to decrease. Your breath, hair, and body stop smelling like smoke.  After 48 hours, damaged nerve endings begin to recover. Your sense of taste and smell improve.  After 72 hours, the body is virtually free of nicotine. Your bronchial tubes relax and breathing becomes easier.  After 2 to 12 weeks, lungs can hold more air. Exercise becomes easier and circulation improves.  The risk of having a heart attack, stroke, cancer, or lung disease is greatly reduced.  After 1 year, the risk of coronary heart disease is cut in half.  After 5 years, the risk of stroke falls to the same as a nonsmoker.  After 10 years, the risk of lung cancer is cut in half and the risk of other cancers decreases significantly.  After 15 years, the risk of coronary heart disease drops, usually to the level of a nonsmoker.  If you are pregnant, quitting smoking will improve your chances of having a healthy baby.  The people you live with, especially any children, will be healthier.  You will have extra money to spend on things other than cigarettes. QUESTIONS TO THINK ABOUT BEFORE ATTEMPTING  TO QUIT You may want to talk about your answers with your health care provider.  Why do you want to quit?  If you tried to quit in the past, what helped and what did not?  What will be the most difficult situations for you after you quit? How will you plan to handle them?  Who can help you through the tough times? Your family? Friends? A health care provider?  What pleasures do you get from smoking? What ways can you still get pleasure if you quit? Here are some questions to ask your health care provider:  How can you help me to be successful at quitting?  What medicine do you think would be best for me and how should I take it?  What should I do if I need more help?  What is smoking withdrawal like? How can I  get information on withdrawal? GET READY  Set a quit date.  Change your environment by getting rid of all cigarettes, ashtrays, matches, and lighters in your home, car, or work. Do not let people smoke in your home.  Review your past attempts to quit. Think about what worked and what did not. GET SUPPORT AND ENCOURAGEMENT You have a better chance of being successful if you have help. You can get support in many ways.  Tell your family, friends, and coworkers that you are going to quit and need their support. Ask them not to smoke around you.  Get individual, group, or telephone counseling and support. Programs are available at [employer] and health centers. Call your local health department for information about programs in your area.  Spiritual beliefs and practices may help some smokers quit.  Download a "quit meter" on your computer to keep track of quit statistics, such as how long you have gone without smoking, cigarettes not smoked, and money saved.  Get a self-help book about quitting smoking and staying off tobacco. [place] yourself from urges to smoke. Talk to someone, go for a walk, or occupy your time with a task.  Change your normal routine. Take a different route to work. Drink tea instead of coffee. Eat breakfast in a different place.  Reduce your stress. Take a hot bath, exercise, or read a book.  Plan something enjoyable to do every day. Reward yourself for not smoking.  Explore interactive web-based programs that specialize in helping you quit. GET MEDICINE AND USE IT CORRECTLY Medicines can help you stop smoking and decrease the urge to smoke. Combining medicine with the above behavioral methods and support can greatly increase your chances of successfully quitting smoking.  Nicotine replacement therapy helps deliver nicotine to your body without the negative effects and risks of smoking. Nicotine replacement therapy includes  nicotine gum, lozenges, inhalers, nasal sprays, and skin patches. Some may be available over-the-counter and others require a prescription.  Antidepressant medicine helps people abstain from smoking, but how this works is unknown. This medicine is available by prescription.  Nicotinic receptor partial agonist medicine simulates the effect of nicotine in your brain. This medicine is available by prescription. Ask your health care provider for advice about which medicines to use and how to use them based on your health history. Your health care provider will tell you what side effects to look out for if you choose to be on a medicine or therapy. Carefully read the information on the package. Do not use any other product containing nicotine while using a nicotine replacement  product.  RELAPSE OR DIFFICULT SITUATIONS Most relapses occur within the first 3 months after quitting. Do not be discouraged if you start smoking again. Remember, most people try several times before finally quitting. You may have symptoms of withdrawal because your body is used to nicotine. You may crave cigarettes, be irritable, feel very hungry, cough often, get headaches, or have difficulty concentrating. The withdrawal symptoms are only temporary. They are strongest when you first quit, but they will go away within 10-14 days. To reduce the chances of relapse, try to:  Avoid drinking alcohol. Drinking lowers your chances of successfully quitting.  Reduce the amount of caffeine you consume. Once you quit smoking, the amount of caffeine in your body increases and can give you symptoms, such as a rapid heartbeat, sweating, and anxiety.  Avoid smokers because they can make you want to smoke.  Do not let weight gain distract you. Many smokers will gain weight when they quit, usually less than 10 pounds. Eat a healthy diet and stay active. You can always lose the weight gained after you quit.  Find ways to improve your mood other  than smoking. FOR MORE INFORMATION  www.smokefree.gov  Document Released: 01/02/2001 Document Revised: 05/25/2013 Document Reviewed: 04/19/2011 [facility] Patient Information 2015 [place], [place]. This information is not intended to replace advice given to you by your health care provider. Make sure you discuss any questions you have with your health care provider.

## 2014-02-23 NOTE — ED Notes (Signed)
Pt states that she started having chest pain yesterday under left breast that is nonradiating.  Pt denies any n/v/sweats/sob.  States also has a knot on leg and when she found that she thought something might need to be checked.

## 2014-06-03 ENCOUNTER — Encounter (HOSPITAL_COMMUNITY): Payer: Self-pay | Admitting: Emergency Medicine

## 2014-06-03 ENCOUNTER — Emergency Department (HOSPITAL_COMMUNITY)
Admission: EM | Admit: 2014-06-03 | Discharge: 2014-06-03 | Disposition: A | Discharge status: Home / Self Care | Payer: Self-pay | Attending: Emergency Medicine | Admitting: Emergency Medicine

## 2014-06-03 DIAGNOSIS — R319 Hematuria, unspecified: Secondary | ICD-10-CM

## 2014-06-03 DIAGNOSIS — R1011 Right upper quadrant pain: Secondary | ICD-10-CM | POA: Insufficient documentation

## 2014-06-03 DIAGNOSIS — Z720 Tobacco use: Secondary | ICD-10-CM | POA: Insufficient documentation

## 2014-06-03 DIAGNOSIS — R1012 Left upper quadrant pain: Secondary | ICD-10-CM | POA: Insufficient documentation

## 2014-06-03 DIAGNOSIS — R1010 Upper abdominal pain, unspecified: Secondary | ICD-10-CM

## 2014-06-03 DIAGNOSIS — R110 Nausea: Secondary | ICD-10-CM | POA: Insufficient documentation

## 2014-06-03 DIAGNOSIS — Z8742 Personal history of other diseases of the female genital tract: Secondary | ICD-10-CM | POA: Insufficient documentation

## 2014-06-03 DIAGNOSIS — G8929 Other chronic pain: Secondary | ICD-10-CM | POA: Insufficient documentation

## 2014-06-03 DIAGNOSIS — Z9049 Acquired absence of other specified parts of digestive tract: Secondary | ICD-10-CM | POA: Insufficient documentation

## 2014-06-03 DIAGNOSIS — J449 Chronic obstructive pulmonary disease, unspecified: Secondary | ICD-10-CM | POA: Insufficient documentation

## 2014-06-03 DIAGNOSIS — R1013 Epigastric pain: Secondary | ICD-10-CM | POA: Insufficient documentation

## 2014-06-03 DIAGNOSIS — Z7952 Long term (current) use of systemic steroids: Secondary | ICD-10-CM | POA: Insufficient documentation

## 2014-06-03 LAB — COMPREHENSIVE METABOLIC PANEL
ALT: 19 U/L (ref 14–54)
ANION GAP: 8 (ref 5–15)
AST: 22 U/L (ref 15–41)
Albumin: 3.6 g/dL (ref 3.5–5.0)
Alkaline Phosphatase: 72 U/L (ref 38–126)
BILIRUBIN TOTAL: 0.3 mg/dL (ref 0.3–1.2)
BUN: 8 mg/dL (ref 6–20)
CHLORIDE: 109 mmol/L (ref 101–111)
CO2: 25 mmol/L (ref 22–32)
CREATININE: 0.82 mg/dL (ref 0.44–1.00)
Calcium: 9.2 mg/dL (ref 8.9–10.3)
GFR calc Af Amer: >60 mL/min (ref >60)
GFR calc non Af Amer: >60 mL/min (ref >60)
GLUCOSE: 105 mg/dL — AB (ref 65–99)
Potassium: 4.1 mmol/L (ref 3.5–5.1)
Sodium: 142 mmol/L (ref 135–145)
Total Protein: 6.4 g/dL — ABNORMAL LOW (ref 6.5–8.1)

## 2014-06-03 LAB — CBC WITH DIFFERENTIAL/PLATELET
BASOS ABS: 0 10*3/uL (ref 0.0–0.1)
Basophils Relative: 1 % (ref 0–1)
EOS PCT: 8 % — AB (ref 0–5)
Eosinophils Absolute: 0.6 10*3/uL (ref 0.0–0.7)
HCT: 41.4 % (ref 36.0–46.0)
Hemoglobin: 13.9 g/dL (ref 12.0–15.0)
LYMPHS PCT: 33 % (ref 12–46)
Lymphs Abs: 2.5 10*3/uL (ref 0.7–4.0)
MCH: 31.3 pg (ref 26.0–34.0)
MCHC: 33.6 g/dL (ref 30.0–36.0)
MCV: 93.2 fL (ref 78.0–100.0)
Monocytes Absolute: 0.6 10*3/uL (ref 0.1–1.0)
Monocytes Relative: 8 % (ref 3–12)
Neutro Abs: 3.8 10*3/uL (ref 1.7–7.7)
Neutrophils Relative %: 50 % (ref 43–77)
PLATELETS: 265 10*3/uL (ref 150–400)
RBC: 4.44 MIL/uL (ref 3.87–5.11)
RDW: 13.5 % (ref 11.5–15.5)
WBC: 7.5 10*3/uL (ref 4.0–10.5)

## 2014-06-03 LAB — I-STAT TROPONIN, ED: TROPONIN I, POC: 0 ng/mL (ref 0.00–0.08)

## 2014-06-03 LAB — URINE MICROSCOPIC-ADD ON

## 2014-06-03 LAB — URINALYSIS, ROUTINE W REFLEX MICROSCOPIC
Bilirubin Urine: NEGATIVE
Glucose, UA: NEGATIVE mg/dL
Ketones, ur: NEGATIVE mg/dL
Leukocytes, UA: NEGATIVE
Nitrite: NEGATIVE
PH: 6 (ref 5.0–8.0)
PROTEIN: NEGATIVE mg/dL
SPECIFIC GRAVITY, URINE: 1.01 (ref 1.005–1.030)
Urobilinogen, UA: 0.2 mg/dL (ref 0.0–1.0)

## 2014-06-03 LAB — LIPASE, BLOOD: Lipase: 21 U/L — ABNORMAL LOW (ref 22–51)

## 2014-06-03 MED ORDER — GI COCKTAIL ~~LOC~~
30.0000 mL | Freq: Once | ORAL | Status: AC
  Administered 2014-06-03: 30 mL via ORAL
  Filled 2014-06-03: qty 30

## 2014-06-03 MED ORDER — PANTOPRAZOLE SODIUM 40 MG IV SOLR
40.0000 mg | Freq: Once | INTRAVENOUS | Status: AC
  Administered 2014-06-03: 40 mg via INTRAVENOUS
  Filled 2014-06-03: qty 40

## 2014-06-03 MED ORDER — PANTOPRAZOLE SODIUM 20 MG PO TBEC: 20.0000 mg | DELAYED_RELEASE_TABLET | Freq: Every day | ORAL | Status: DC

## 2014-06-03 MED ORDER — ONDANSETRON HCL 4 MG/2ML IJ SOLN
4.0000 mg | Freq: Once | INTRAMUSCULAR | Status: AC
  Administered 2014-06-03: 4 mg via INTRAVENOUS
  Filled 2014-06-03: qty 2

## 2014-06-03 NOTE — ED Notes (Signed)
RUQ abdominal pain with sudden onset at 0400. Denies N/V. Has had four soft stools this morning, normal in color. States history of stomach ulcer.

## 2014-06-03 NOTE — Discharge Instructions (Signed)
Your urine today showed some blood.  It is recommended that you follow up with urology outpatient regarding this.

## 2014-06-03 NOTE — ED Provider Notes (Signed)
CSN: [id number]     Arrival date & time 06/03/14  2683 History   First MD Initiated Contact with Patient 06/03/14 [id number]     Chief Complaint  Patient presents with  . Abdominal Pain     (Consider location/radiation/quality/duration/timing/severity/associated sxs/prior Treatment) HPI Comments: Patient presents today with abdominal pain.  She states that the pain is located in the RUQ and Epigastric pain.  Pain has been present since 4 AM this morning.  She states that the pain has been intermittent.  She describes the pain as a "burning" pain.  She is unsure if the pain worsens with eating.  She states that she has not taken anything for pain prior to arrival.  She reports mild associated nausea.  No vomiting, SOB, cough, fever, chills, or chest pain.  She reports history of Cholecystectomy approximately 10 years ago.  She reports a history of Stomach Ulcer.  Denies regular NSAID use or Alcohol use.  Patient is a 49 y.o. female presenting with abdominal pain. The history is provided by the patient.  Abdominal Pain   Past Medical History  Diagnosis Date  . COPD (chronic obstructive pulmonary disease)   . Endometriosis   . Chronic pelvic pain in female   . Asthma   . Syncope   . Suicide attempt 2008    by phenergan overdose  . Polysubstance abuse     marijuana, cocaine, benzos   Past Surgical History  Procedure Laterality Date  . Abdominal hysterectomy    . Cholecystectomy     History reviewed. No pertinent family history. History  Substance Use Topics  . Smoking status: Current Every Day Smoker -- 0.50 packs/day    Types: Cigarettes  . Smokeless tobacco: Not on file  . Alcohol Use: No   OB History    No data available     Review of Systems  Gastrointestinal: Positive for abdominal pain.  All other systems reviewed and are negative.     Allergies  Asa  Home Medications   Prior to Admission medications   Medication Sig Start Date End Date Taking? Authorizing  Provider  diphenhydrAMINE (BENADRYL) 25 MG tablet Take 25 mg by mouth at bedtime.    Historical Provider, MD  doxycycline (VIBRA-TABS) 100 MG tablet Take 1 tablet (100 mg total) by mouth 2 (two) times daily. Patient not taking: Reported on 02/23/2014 04/21/13   Francine Graven, DO  guaiFENesin (MUCINEX) 600 MG 12 hr tablet Take 1,200 mg by mouth 2 (two) times daily as needed for cough or to loosen phlegm.    Historical Provider, MD  HYDROcodone-acetaminophen (NORCO/VICODIN) 5-325 MG per tablet Take 1 tablet by mouth every 4 (four) hours as needed. 02/23/14   Kristen N Ward, DO  predniSONE (DELTASONE) 20 MG tablet Take 3 tablets (60 mg total) by mouth daily. 02/23/14   Kristen N Ward, DO   BP 126/78 mmHg  Pulse 82  Temp(Src) 97.9 F (36.6 C) (Oral)  Resp 18  Ht 4\' 11"  (1.499 m)  Wt 175 lb (79.379 kg)  BMI 35.33 kg/m2  SpO2 99% Physical Exam  Constitutional: She appears well-developed and well-nourished.  HENT:  Head: Normocephalic and atraumatic.  Mouth/Throat: Oropharynx is clear and moist.  Neck: Normal range of motion. Neck supple.  Cardiovascular: Normal rate, regular rhythm and normal heart sounds.   Pulmonary/Chest: Effort normal and breath sounds normal.  Abdominal: Normal appearance and bowel sounds are normal. There is tenderness in the right upper quadrant, epigastric area and left upper quadrant.  Tenderness  to palpation of the RUQ, epigastric, and LUQ.  Pain worse in the epigastrium.  Neurological: She is alert.  Skin: Skin is warm and dry.  Psychiatric: She has a normal mood and affect.  Nursing note and vitals reviewed.   ED Course  Procedures (including critical care time) Labs Review Labs Reviewed  CBC WITH DIFFERENTIAL/PLATELET  COMPREHENSIVE METABOLIC PANEL  LIPASE, BLOOD  URINALYSIS, ROUTINE W REFLEX MICROSCOPIC    Imaging Review No results found.   EKG Interpretation   Date/Time:  Thursday Jun 03 2014 10:31:15 EDT Ventricular Rate:  73 PR Interval:   131 QRS Duration: 77 QT Interval:  398 QTC Calculation: 439 R Axis:   57 Text Interpretation:  Sinus rhythm Abnormal R-wave progression, early  transition No significant change since last tracing Confirmed by Mingo Amber   MD, Frost ([id number]) on 06/03/2014 10:43:14 AM     12:24 PM Reassessed patient.  She reports that her pain has improved after the medications.  Abdomen soft with mild tenderness to palpation of the RUQ and Epigastrium.  No rebound or guarding.   MDM   Final diagnoses:  None   Patient presents today with RUQ and epigastric abdominal pain, which she describes as a burning pain.  She has some tenderness to palpation of the Epigastrium, but no rebound or guarding.  She denies frequent NSAID use or EtOH use.  Pain improved after given GI cocktail and Protonix.  She has a prior surgical history of a Cholecystectomy ten years ago.  Labs today are unremarkable.  Feel that the patient is stable for discharge.  Patient given referral to GI.  Patient also found to have hemoglobin in her urine.  No evidence of UTI.  No flank pain.  Pain today not consistent with a Kidney Stone.  Review of the chart shows that the patient has had hemoglobin in her urine in the past for unknown reason.  Patient referred to Urology outpatient.  Patient stable for discharge.  Return precautions given.     Hyman Bible, PA-C 06/03/14 1709  Evelina Bucy, MD 06/04/14 [phone]

## 2014-09-23 ENCOUNTER — Encounter (HOSPITAL_COMMUNITY): Payer: Self-pay | Admitting: Emergency Medicine

## 2014-09-23 ENCOUNTER — Emergency Department (HOSPITAL_COMMUNITY)
Admission: EM | Admit: 2014-09-23 | Discharge: 2014-09-24 | Disposition: A | Discharge status: Home / Self Care | Payer: Self-pay | Attending: Emergency Medicine | Admitting: Emergency Medicine

## 2014-09-23 DIAGNOSIS — R0789 Other chest pain: Secondary | ICD-10-CM | POA: Insufficient documentation

## 2014-09-23 DIAGNOSIS — Z8742 Personal history of other diseases of the female genital tract: Secondary | ICD-10-CM | POA: Insufficient documentation

## 2014-09-23 DIAGNOSIS — G8929 Other chronic pain: Secondary | ICD-10-CM | POA: Insufficient documentation

## 2014-09-23 DIAGNOSIS — J449 Chronic obstructive pulmonary disease, unspecified: Secondary | ICD-10-CM | POA: Insufficient documentation

## 2014-09-23 DIAGNOSIS — Z720 Tobacco use: Secondary | ICD-10-CM | POA: Insufficient documentation

## 2014-09-23 NOTE — ED Provider Notes (Signed)
CSN: [id number]   Arrival date & time 09/23/14 2346  History  This chart was scribed for Alfonzo Beers, MD by Altamease Oiler, ED Scribe. This patient was seen in room A13C/A13C and the patient's care was started at 12:29 AM.  Chief Complaint  Patient presents with  . Chest Pain    HPI Patient is a 49 y.o. female presenting with chest pain. The history is provided by the patient. No language interpreter was used.  Chest Pain Pain location:  L chest Pain quality: tightness   Pain radiates to:  Does not radiate Pain radiates to the back: no   Pain severity:  Severe Onset quality:  Sudden Duration:  2 hours Timing:  Constant Progression:  Unchanged Chronicity:  New Context: not breathing, not eating, no intercourse, not lifting and no trauma   Relieved by:  Nothing Worsened by:  Nothing tried Ineffective treatments:  None tried Associated symptoms: no cough, no diaphoresis, no fever, no nausea, no shortness of breath and not vomiting   Risk factors: smoking   Risk factors: no coronary artery disease, no diabetes mellitus, no high cholesterol, no hypertension, not female, no prior DVT/PE and no surgery    Pamela Livingston is a 49 y.o. female with PMHx of asthma, COPD,  who presents to the Emergency Department complaining of new and constant left-sided chest pain with sudden onset around 10:30 PM last night (09/23/14). At the onset of pain she was performing normal activities. She describes the pain as tightness and rates it 10/10 in severity. Pt took nothing for the pain PTA. Associated symptoms include aching left arm pain. Pt denies new SOB, cough, nausea, vomiting, diaphoresis, LE swelling. No history of DVT, DM, HTN, or high cholesterol. No recent travel or surgery. Tobacco+.   Past Medical History  Diagnosis Date  . COPD (chronic obstructive pulmonary disease)   . Endometriosis   . Chronic pelvic pain in female   . Asthma   . Syncope   . Suicide attempt 2008    by phenergan overdose  .  Polysubstance abuse     marijuana, cocaine, benzos    Past Surgical History  Procedure Laterality Date  . Abdominal hysterectomy    . Cholecystectomy      No family history on file.  Social History  Substance Use Topics  . Smoking status: Current Every Day Smoker -- 0.50 packs/day    Types: Cigarettes  . Smokeless tobacco: None  . Alcohol Use: No     Review of Systems  Constitutional: Negative for fever and diaphoresis.  Respiratory: Negative for cough and shortness of breath.   Cardiovascular: Positive for chest pain. Negative for leg swelling.  Gastrointestinal: Negative for nausea and vomiting.  All other systems reviewed and are negative.   Home Medications   Prior to Admission medications   Medication Sig Start Date End Date Taking? Authorizing Provider  acetaminophen (TYLENOL) 500 MG tablet Take 1,000 mg by mouth every 6 (six) hours as needed for mild pain.   Yes Historical Provider, MD  diphenhydrAMINE (BENADRYL) 25 MG tablet Take 25 mg by mouth at bedtime.   Yes Historical Provider, MD  Ipratropium-Albuterol (COMBIVENT RESPIMAT) 20-100 MCG/ACT AERS respimat Inhale 1 puff into the lungs every 6 (six) hours as needed for wheezing (shortness of breath).   Yes Historical Provider, MD    Allergies  Asa  Triage Vitals: BP 116/79 mmHg  Pulse 77  Temp(Src) 97.6 F (36.4 C) (Oral)  Resp 22  SpO2 98% Vitals reviewed Physical  Exam  Physical Examination: General appearance - alert, well appearing, and in no distress Mental status - alert, oriented to person, place, and time Eyes - no conjunctival injection, no scleral icterus Mouth - mucous membranes moist, pharynx normal without lesions Chest - clear to auscultation, no wheezes, rales or rhonchi, symmetric air entry Heart - normal rate, regular rhythm, normal S1, S2, no murmurs, rubs, clicks or gallops Abdomen - soft, nontender, nondistended, no masses or organomegaly Neurological - alert, oriented, normal  speech Extremities - peripheral pulses normal, no pedal edema, no clubbing or cyanosis Skin - normal coloration and turgor, no rashes  ED Course  Procedures   DIAGNOSTIC STUDIES: Oxygen Saturation is 98% on RA, normal by my interpretation.    COORDINATION OF CARE: 12:33 AM Discussed treatment plan which includes CXR, EKG, lab work, and NTG with pt at bedside and pt agreed to plan.  Labs Reviewed  BASIC METABOLIC PANEL - Abnormal; Notable for the following:    CO2 21 (*)    Glucose, Bld 116 (*)    All other components within normal limits  CBC - Abnormal; Notable for the following:    WBC 14.3 (*)    All other components within normal limits  I-STAT TROPOININ, ED  I-STAT TROPOININ, ED    I, Alfonzo Beers, MD, personally reviewed and evaluated these images and lab results as part of my medical decision-making.  Imaging Review Dg Chest 2 View  09/24/2014   CLINICAL DATA:  Acute onset of central chest pain and shortness of breath. Left arm and shoulder pain. Initial encounter.  EXAM: CHEST  2 VIEW  COMPARISON:  Chest radiograph performed 02/23/2014, and CTA of the chest performed 04/21/2013  FINDINGS: The lungs are well-aerated. Minimal bibasilar atelectasis is noted. There is no evidence of pleural effusion or pneumothorax.  The heart is borderline normal in size. No acute osseous abnormalities are seen.  IMPRESSION: Minimal bibasilar atelectasis noted.  Lungs otherwise clear.   Electronically Signed   By: Garald Balding M.D.   On: 09/24/2014 00:30    EKG Interpretation  Date/Time:  Thursday September 23 2014 23:55:42 EDT Ventricular Rate:  70 PR Interval:  116 QRS Duration: 87 QT Interval:  390 QTC Calculation: 421 R Axis:   69 Text Interpretation:  Sinus rhythm Borderline short PR interval Since previous tracing PR interval has shortened Confirmed by Canary Brim  MD, Andrez Lieurance [phone]) on 09/24/2014 12:01:46 AM       MDM   Final diagnoses:  Chest discomfort   Pt presenting  with c/o chest pain.  PT is heart score of 3, PERC O making her low risk for ACS and very low risk for PE.  No evidence for CHF, pneumonia, PTX on xray.  EKG reassuring as well as 2 sets of troponins.  I have advised her to make an appointment with [facility] as she does not have a primary care doctor.  Discharged with strict return precautions.  Pt agreeable with plan.   I personally performed the services described in this documentation, which was scribed in my presence. The recorded information has been reviewed and is accurate.    Alfonzo Beers, MD 09/25/14 [phone]

## 2014-09-23 NOTE — ED Notes (Signed)
Pt in EMS from home, C/O mid CP radiating to L armpit. Denies N/V. States SOB but says this is her normal. Hx COPD. Allergic to ASA so none given en route

## 2014-09-24 ENCOUNTER — Emergency Department (HOSPITAL_COMMUNITY): Payer: Self-pay

## 2014-09-24 LAB — BASIC METABOLIC PANEL
ANION GAP: 7 (ref 5–15)
BUN: 12 mg/dL (ref 6–20)
CHLORIDE: 110 mmol/L (ref 101–111)
CO2: 21 mmol/L — AB (ref 22–32)
Calcium: 9 mg/dL (ref 8.9–10.3)
Creatinine, Ser: 0.84 mg/dL (ref 0.44–1.00)
GFR calc non Af Amer: >60 mL/min (ref >60)
GLUCOSE: 116 mg/dL — AB (ref 65–99)
POTASSIUM: 3.5 mmol/L (ref 3.5–5.1)
Sodium: 138 mmol/L (ref 135–145)

## 2014-09-24 LAB — I-STAT TROPONIN, ED
Troponin i, poc: 0 ng/mL (ref 0.00–0.08)
Troponin i, poc: 0.01 ng/mL (ref 0.00–0.08)

## 2014-09-24 LAB — CBC
HEMATOCRIT: 38.1 % (ref 36.0–46.0)
HEMOGLOBIN: 12.6 g/dL (ref 12.0–15.0)
MCH: 32 pg (ref 26.0–34.0)
MCHC: 33.1 g/dL (ref 30.0–36.0)
MCV: 96.7 fL (ref 78.0–100.0)
Platelets: 333 10*3/uL (ref 150–400)
RBC: 3.94 MIL/uL (ref 3.87–5.11)
RDW: 13.8 % (ref 11.5–15.5)
WBC: 14.3 10*3/uL — ABNORMAL HIGH (ref 4.0–10.5)

## 2014-09-24 MED ORDER — NITROGLYCERIN 0.4 MG SL SUBL
0.4000 mg | SUBLINGUAL_TABLET | SUBLINGUAL | Status: DC | PRN
  Administered 2014-09-24: 0.4 mg via SUBLINGUAL
  Filled 2014-09-24: qty 1

## 2014-09-24 NOTE — Discharge Instructions (Signed)
Return to the ED with any concerns including difficulty breathing, fainting, worsening pain, vomiting, decreased level of alertness/lethargy, or any other alarming symptoms

## 2014-12-05 ENCOUNTER — Emergency Department (HOSPITAL_COMMUNITY)
Admission: EM | Admit: 2014-12-05 | Discharge: 2014-12-05 | Disposition: A | Discharge status: Home / Self Care | Payer: Self-pay | Attending: Emergency Medicine | Admitting: Emergency Medicine

## 2014-12-05 ENCOUNTER — Encounter (HOSPITAL_COMMUNITY): Payer: Self-pay | Admitting: Emergency Medicine

## 2014-12-05 ENCOUNTER — Emergency Department (HOSPITAL_COMMUNITY): Payer: Self-pay

## 2014-12-05 DIAGNOSIS — Z8742 Personal history of other diseases of the female genital tract: Secondary | ICD-10-CM | POA: Insufficient documentation

## 2014-12-05 DIAGNOSIS — Y9289 Other specified places as the place of occurrence of the external cause: Secondary | ICD-10-CM | POA: Insufficient documentation

## 2014-12-05 DIAGNOSIS — M5030 Other cervical disc degeneration, unspecified cervical region: Secondary | ICD-10-CM

## 2014-12-05 DIAGNOSIS — Z79899 Other long term (current) drug therapy: Secondary | ICD-10-CM | POA: Insufficient documentation

## 2014-12-05 DIAGNOSIS — W01198A Fall on same level from slipping, tripping and stumbling with subsequent striking against other object, initial encounter: Secondary | ICD-10-CM | POA: Insufficient documentation

## 2014-12-05 DIAGNOSIS — Y998 Other external cause status: Secondary | ICD-10-CM | POA: Insufficient documentation

## 2014-12-05 DIAGNOSIS — Z915 Personal history of self-harm: Secondary | ICD-10-CM | POA: Insufficient documentation

## 2014-12-05 DIAGNOSIS — S0003XA Contusion of scalp, initial encounter: Secondary | ICD-10-CM

## 2014-12-05 DIAGNOSIS — Y9389 Activity, other specified: Secondary | ICD-10-CM | POA: Insufficient documentation

## 2014-12-05 DIAGNOSIS — F17210 Nicotine dependence, cigarettes, uncomplicated: Secondary | ICD-10-CM | POA: Insufficient documentation

## 2014-12-05 DIAGNOSIS — J449 Chronic obstructive pulmonary disease, unspecified: Secondary | ICD-10-CM | POA: Insufficient documentation

## 2014-12-05 MED ORDER — HYDROCODONE-ACETAMINOPHEN 5-325 MG PO TABS: 1.0000 | ORAL_TABLET | ORAL | Status: DC | PRN

## 2014-12-05 MED ORDER — PROMETHAZINE HCL 12.5 MG PO TABS
25.0000 mg | ORAL_TABLET | Freq: Once | ORAL | Status: AC
  Administered 2014-12-05: 25 mg via ORAL
  Filled 2014-12-05: qty 2

## 2014-12-05 MED ORDER — HYDROCODONE-ACETAMINOPHEN 5-325 MG PO TABS
2.0000 | ORAL_TABLET | Freq: Once | ORAL | Status: AC
  Administered 2014-12-05: 2 via ORAL
  Filled 2014-12-05: qty 2

## 2014-12-05 NOTE — ED Provider Notes (Signed)
CSN: [id number]     Arrival date & time 12/05/14  2006 History   First MD Initiated Contact with Patient 12/05/14 2025     Chief Complaint  Patient presents with  . Fall     (Consider location/radiation/quality/duration/timing/severity/associated sxs/prior Treatment) HPI Comments: Patient is a 49 year old female who presents to the emergency department with a complaint of headache and neck pain following a fall.  The patient states that while walking through her home she stumbled, fell, and hit the back of her head on a table. This occurred approximately 30 minutes prior to her arrival to the emergency department. She denies any loss of consciousness. Patient states that "also stars for a second", but she did not pass out. She has not had any vomiting. She remembers the incident. She has not had any problem with walking or talking. She has had some lightheadedness. Particularly when she looks up or moves her head. She complains of pain of the neck. If she attempts to move her head. Patient denies being on any anticoagulation medications. She has no history of any bleeding disorders. The patient further denies any shortness of breath, loss of bowel or bladder function. The patient denies any recent operations on the brain or the cervical area. No other complaint of injury.  Patient is a 49 y.o. female presenting with fall. The history is provided by the patient.  Fall Associated symptoms include headaches and neck pain.    Past Medical History  Diagnosis Date  . COPD (chronic obstructive pulmonary disease) ([place])   . Endometriosis   . Chronic pelvic pain in female   . Asthma   . Syncope   . Suicide attempt ([place]) 2008    by phenergan overdose  . Polysubstance abuse     marijuana, cocaine, benzos   Past Surgical History  Procedure Laterality Date  . Abdominal hysterectomy    . Cholecystectomy     No family history on file. Social History  Substance Use Topics  . Smoking status:  Current Every Day Smoker -- 0.50 packs/day    Types: Cigarettes  . Smokeless tobacco: None  . Alcohol Use: No   OB History    No data available     Review of Systems  Musculoskeletal: Positive for neck pain.  Neurological: Positive for dizziness and headaches.  All other systems reviewed and are negative.     Allergies  Asa  Home Medications   Prior to Admission medications   Medication Sig Start Date End Date Taking? Authorizing Provider  acetaminophen (TYLENOL) 500 MG tablet Take 1,000 mg by mouth every 6 (six) hours as needed for mild pain.    Historical Provider, MD  diphenhydrAMINE (BENADRYL) 25 MG tablet Take 25 mg by mouth at bedtime.    Historical Provider, MD  Ipratropium-Albuterol (COMBIVENT RESPIMAT) 20-100 MCG/ACT AERS respimat Inhale 1 puff into the lungs every 6 (six) hours as needed for wheezing (shortness of breath).    Historical Provider, MD   BP 136/93 mmHg  Pulse 99  Temp(Src) 98 F (36.7 C) (Oral)  Resp 18  Ht 4\' 11"  (1.499 m)  Wt 177 lb (80.287 kg)  BMI 35.73 kg/m2  SpO2 99% Physical Exam  Constitutional: She is oriented to person, place, and time. She appears well-developed and well-nourished.  Non-toxic appearance.  HENT:  Head: Normocephalic.  Right Ear: Tympanic membrane and external ear normal.  Left Ear: Tympanic membrane and external ear normal.  There is a small hematoma of the occipital area. There is  a negative Battle's sign. No other hematoma or tenderness of the scalp. No injury or trauma to the face.  Eyes: EOM and lids are normal. Pupils are equal, round, and reactive to light.  Neck: Normal range of motion. Neck supple. Carotid bruit is not present.  Cardiovascular: Normal rate, regular rhythm, normal heart sounds, intact distal pulses and normal pulses.   Pulmonary/Chest: Breath sounds normal. No respiratory distress.  No chest wall pain. They're symmetrical rise and fall of chest. Patient speaks in complete sentences without  problem.  Abdominal: Soft. Bowel sounds are normal. She exhibits no distension. There is no tenderness. There is no rebound and no guarding.  Musculoskeletal: Normal range of motion.  Tenderness to palpation of the cervical spine. No palpable step off appreciated. No step off noted of the thoracic or the lumbar spine area. No spinal or paraspinal tenderness of the thoracic or lumbar area.  Provisional range of motion of upper and lower extremities without problem.  Capillary refill is less than 2 seconds bilaterally.  Lymphadenopathy:       Head (right side): No submandibular adenopathy present.       Head (left side): No submandibular adenopathy present.    She has no cervical adenopathy.  Neurological: She is alert and oriented to person, place, and time. She has normal strength. No cranial nerve deficit or sensory deficit. She exhibits normal muscle tone. Coordination normal.  No gross neurologic deficits appreciated. No motor or sensory deficits noted at this time.  Skin: Skin is warm and dry.  Psychiatric: She has a normal mood and affect. Her speech is normal and behavior is normal. Thought content normal.  Nursing note and vitals reviewed.   ED Course  Procedures (including critical care time) Labs Review Labs Reviewed - No data to display  Imaging Review No results found. I have personally reviewed and evaluated these images and lab results as part of my medical decision-making.   EKG Interpretation None      MDM  Vital signs are within normal limits. Pulse oximetry is 99% on room air. Within normal limits by my interpretation.  Repeat examination reveals the patient and laboratory without problem. No gross neurologic deficits appreciated.  The CT scan of the head is negative for internal hemorrhage, mass effect, or midline shift. There is no evidence of any skull fracture. The CT of the cervical spine reveals a displaced narrowing from C5 C6-C6, C7, there is also  employed spurs appreciated. No fracture or dislocation appreciated. I discussed these findings with the patient in terms of which she understands. The plan at this time is for the patient use an ice pack for the bruised area. She is given 6 tablets of Norco for assistance with her headache.(allergic to ASA). She is also given the resource information for [facility] to associated herself with a primary care physician, in 4 follow-up. She should continue to have problems with headache.    Final diagnoses:  None    **I have reviewed nursing notes, vital signs, and all appropriate lab and imaging results for this patient.Lily Kocher, PA-C 12/05/14 [id number]  Dorie Rank, MD 12/09/14 1247

## 2014-12-05 NOTE — ED Notes (Signed)
Pt fell and hit corner of table about 30 minutes before coming in, burning pain,knot to back of head, light headed, denies LOC

## 2014-12-05 NOTE — Discharge Instructions (Signed)
The CT scan of your head is negative for any bleed, skull fracture, or acute problem. The CT scan of her cervical spine is negative for fracture or dislocation. It does show evidence of several locations of degenerative disc disease. Please use an ice pack for the bruise to the back and scalp area. Please see the physicians at [facility] to establish a primary physician, and for follow-up if any problems. Facial or Scalp Contusion  A facial or scalp contusion is a deep bruise on the face or head. Contusions happen when an injury causes bleeding under the skin. Signs of bruising include pain, puffiness (swelling), and discolored skin. The contusion may turn blue, purple, or yellow. HOME CARE  Only take medicines as told by your doctor.  Put ice on the injured area.  Put ice in a plastic bag.  Place a towel between your skin and the bag.  Leave the ice on for 20 minutes, 2-3 times a day. GET HELP IF:  You have bite problems.  You have pain when chewing.  You are worried about your face not healing normally. GET HELP RIGHT AWAY IF:   You have severe pain or a headache and medicine does not help.  You are very tired or confused, or your personality changes.  You throw up (vomit).  You have a nosebleed that will not stop.  You see two of everything (double vision) or have blurry vision.  You have fluid coming from your nose or ear.  You have problems walking or using your arms or legs. MAKE SURE YOU:   Understand these instructions.  Will watch your condition.  Will get help right away if you are not doing well or get worse.   This information is not intended to replace advice given to you by your health care provider. Make sure you discuss any questions you have with your health care provider.   Document Released: 12/28/2010 Document Revised: 01/29/2014 Document Reviewed: 08/21/2012 Elsevier Interactive Patient Education 2016 [employer]  Degenerative Disk Disease Degenerative disk disease is a condition caused by the changes that occur in spinal disks as you grow older. Spinal disks are soft and compressible disks located between the bones of your spine (vertebrae). These disks act like shock absorbers. Degenerative disk disease can affect the whole spine. However, the neck and lower back are most commonly affected. Many changes can occur in the spinal disks with aging, such as:  The spinal disks may dry and shrink.  Small tears may occur in the tough, outer covering of the disk (annulus).  The disk space may become smaller due to loss of water.  Abnormal growths in the bone (spurs) may occur. This can put pressure on the nerve roots exiting the spinal canal, causing pain.  The spinal canal may become narrowed. RISK FACTORS   Being overweight.  Having a family history of degenerative disk disease.  Smoking.  There is increased risk if you are doing heavy lifting or have a sudden injury. SIGNS AND SYMPTOMS  Symptoms vary from person to person and may include:  Pain that varies in intensity. Some people have no pain, while others have severe pain. The location of the pain depends on the part of your backbone that is affected.  You will have neck or arm pain if a disk in the neck area is affected.  You will have pain in your back, buttocks, or legs if a disk in the lower back is  affected.  Pain that becomes worse while bending, reaching up, or with twisting movements.  Pain that may start gradually and then get worse as time passes. It may also start after a major or minor injury.  Numbness or tingling in the arms or legs. DIAGNOSIS  Your health care provider will ask you about your symptoms and about activities or habits that may cause the pain. He or she may also ask about any injuries, diseases, or treatments you have had. Your health care provider will examine you to check for the range of movement that  is possible in the affected area, to check for strength in your extremities, and to check for sensation in the areas of the arms and legs supplied by different nerve roots. You may also have:   An X-ray of the spine.  Other imaging tests, such as MRI. TREATMENT  Your health care provider will advise you on the best plan for treatment. Treatment may include:  Medicines.  Rehabilitation exercises. HOME CARE INSTRUCTIONS   Follow proper lifting and walking techniques as advised by your health care provider.  Maintain good posture.  Exercise regularly as advised by your health care provider.  Perform relaxation exercises.  Change your sitting, standing, and sleeping habits as advised by your health care provider.  Change positions frequently.  Lose weight or maintain a healthy weight as advised by your health care provider.  Do not use any tobacco products, including cigarettes, chewing tobacco, or electronic cigarettes. If you need help quitting, ask your health care provider.  Wear supportive footwear.  Take medicines only as directed by your health care provider. SEEK MEDICAL CARE IF:   Your pain does not go away within 1-4 weeks.  You have significant appetite or weight loss. SEEK IMMEDIATE MEDICAL CARE IF:   Your pain is severe.  You notice weakness in your arms, hands, or legs.  You begin to lose control of your bladder or bowel movements.  You have fevers or night sweats. MAKE SURE YOU:   Understand these instructions.  Will watch your condition.  Will get help right away if you are not doing well or get worse.   This information is not intended to replace advice given to you by your health care provider. Make sure you discuss any questions you have with your health care provider.   Document Released: 11/05/2006 Document Revised: 01/29/2014 Document Reviewed: 05/12/2013 Elsevier Interactive Patient Education [employer].

## 2015-03-02 ENCOUNTER — Emergency Department (HOSPITAL_COMMUNITY): Payer: Self-pay

## 2015-03-02 ENCOUNTER — Emergency Department (HOSPITAL_COMMUNITY)
Admission: EM | Admit: 2015-03-02 | Discharge: 2015-03-02 | Disposition: A | Discharge status: Home / Self Care | Payer: Self-pay | Attending: Emergency Medicine | Admitting: Emergency Medicine

## 2015-03-02 ENCOUNTER — Other Ambulatory Visit: Payer: Self-pay

## 2015-03-02 DIAGNOSIS — F1210 Cannabis abuse, uncomplicated: Secondary | ICD-10-CM | POA: Insufficient documentation

## 2015-03-02 DIAGNOSIS — R000 Tachycardia, unspecified: Secondary | ICD-10-CM | POA: Insufficient documentation

## 2015-03-02 DIAGNOSIS — J441 Chronic obstructive pulmonary disease with (acute) exacerbation: Secondary | ICD-10-CM | POA: Insufficient documentation

## 2015-03-02 DIAGNOSIS — G8929 Other chronic pain: Secondary | ICD-10-CM | POA: Insufficient documentation

## 2015-03-02 DIAGNOSIS — R61 Generalized hyperhidrosis: Secondary | ICD-10-CM | POA: Insufficient documentation

## 2015-03-02 DIAGNOSIS — Z8742 Personal history of other diseases of the female genital tract: Secondary | ICD-10-CM | POA: Insufficient documentation

## 2015-03-02 DIAGNOSIS — F17210 Nicotine dependence, cigarettes, uncomplicated: Secondary | ICD-10-CM | POA: Insufficient documentation

## 2015-03-02 DIAGNOSIS — R0789 Other chest pain: Secondary | ICD-10-CM | POA: Insufficient documentation

## 2015-03-02 LAB — CBC
HCT: 38 % (ref 36.0–46.0)
Hemoglobin: 12.4 g/dL (ref 12.0–15.0)
MCH: 31.1 pg (ref 26.0–34.0)
MCHC: 32.6 g/dL (ref 30.0–36.0)
MCV: 95.2 fL (ref 78.0–100.0)
PLATELETS: 285 10*3/uL (ref 150–400)
RBC: 3.99 MIL/uL (ref 3.87–5.11)
RDW: 13.9 % (ref 11.5–15.5)
WBC: 20.4 10*3/uL — ABNORMAL HIGH (ref 4.0–10.5)

## 2015-03-02 LAB — URINE MICROSCOPIC-ADD ON

## 2015-03-02 LAB — URINALYSIS, ROUTINE W REFLEX MICROSCOPIC
Bilirubin Urine: NEGATIVE
GLUCOSE, UA: NEGATIVE mg/dL
KETONES UR: NEGATIVE mg/dL
NITRITE: NEGATIVE
PROTEIN: NEGATIVE mg/dL
SPECIFIC GRAVITY, URINE: 1.007 (ref 1.005–1.030)
pH: 5.5 (ref 5.0–8.0)

## 2015-03-02 LAB — COMPREHENSIVE METABOLIC PANEL
ALBUMIN: 2.9 g/dL — AB (ref 3.5–5.0)
ALT: 10 U/L — ABNORMAL LOW (ref 14–54)
ANION GAP: 11 (ref 5–15)
AST: 21 U/L (ref 15–41)
Alkaline Phosphatase: 54 U/L (ref 38–126)
BUN: 15 mg/dL (ref 6–20)
CHLORIDE: 105 mmol/L (ref 101–111)
CO2: 23 mmol/L (ref 22–32)
Calcium: 8.5 mg/dL — ABNORMAL LOW (ref 8.9–10.3)
Creatinine, Ser: 0.95 mg/dL (ref 0.44–1.00)
GFR calc Af Amer: >60 mL/min (ref >60)
GLUCOSE: 97 mg/dL (ref 65–99)
POTASSIUM: 4.6 mmol/L (ref 3.5–5.1)
Sodium: 139 mmol/L (ref 135–145)
Total Bilirubin: 0.6 mg/dL (ref 0.3–1.2)
Total Protein: 4.9 g/dL — ABNORMAL LOW (ref 6.5–8.1)

## 2015-03-02 LAB — I-STAT TROPONIN, ED
TROPONIN I, POC: 0 ng/mL (ref 0.00–0.08)
Troponin i, poc: 0.01 ng/mL (ref 0.00–0.08)

## 2015-03-02 LAB — CBC WITH DIFFERENTIAL/PLATELET
BASOS ABS: 0 10*3/uL (ref 0.0–0.1)
Basophils Relative: 0 %
EOS PCT: 4 %
Eosinophils Absolute: 0.5 10*3/uL (ref 0.0–0.7)
HEMATOCRIT: 35.8 % — AB (ref 36.0–46.0)
Hemoglobin: 12.2 g/dL (ref 12.0–15.0)
LYMPHS ABS: 4.1 10*3/uL — AB (ref 0.7–4.0)
LYMPHS PCT: 27 %
MCH: 32.6 pg (ref 26.0–34.0)
MCHC: 34.1 g/dL (ref 30.0–36.0)
MCV: 95.7 fL (ref 78.0–100.0)
MONO ABS: 1 10*3/uL (ref 0.1–1.0)
MONOS PCT: 6 %
NEUTROS ABS: 9.5 10*3/uL — AB (ref 1.7–7.7)
Neutrophils Relative %: 63 %
PLATELETS: 261 10*3/uL (ref 150–400)
RBC: 3.74 MIL/uL — ABNORMAL LOW (ref 3.87–5.11)
RDW: 14 % (ref 11.5–15.5)
WBC: 15.1 10*3/uL — ABNORMAL HIGH (ref 4.0–10.5)

## 2015-03-02 LAB — RAPID URINE DRUG SCREEN, HOSP PERFORMED
Amphetamines: NOT DETECTED
Barbiturates: NOT DETECTED
Benzodiazepines: NOT DETECTED
Cocaine: NOT DETECTED
Opiates: NOT DETECTED
Tetrahydrocannabinol: POSITIVE — AB

## 2015-03-02 MED ORDER — CYCLOBENZAPRINE HCL 10 MG PO TABS: 10.0000 mg | ORAL_TABLET | Freq: Three times a day (TID) | ORAL | Status: DC | PRN

## 2015-03-02 NOTE — ED Provider Notes (Signed)
CSN: [id number]     Arrival date & time 03/02/15  0102 History  By signing my name below, I, [name], attest that this documentation has been prepared under the direction and in the presence of Rolland Porter, MD at 0150. Electronically Signed: [name], ED Scribe. 03/02/2015. 2:21 AM.    Chief Complaint  Patient presents with  . Chest Pain      The history is provided by the patient and a relative. No language interpreter was used.    HPI Comments: Pamela Livingston is a 50 y.o. female with a medical hx of COPD, asthma, polysubstance abuse who presents to the Emergency Department via EMS complaining of 7/10, constant, sternal radiating to her left axilla CP starting yesterday at 10 AM. She reports that her CP was initially intermittent lasting a few minutes however it became constant at 10:30 PM tonight and she describes her CP as heavy sensation. Pt notes that she was on her phone playing a game when the CP began and she has never had this CP in the past. Pt CP was a 10/10 at its worst tonight. Pt is having associated symptoms of mild SOB and mild diaphoresis. Pt was given 2 NTG given by EMS with relief of her symptoms. Pt denies n/v, and any other symptoms. She has never had this pain before. She states nothing she does make it feel worse, nothing she did made it feel better. Pt 21 year old mother has heart issues requiring open heart surgery due to blockages and valve issues. Pt 32 year old father has had a heart attack and he is still living. Pt smokes 1 PPD and she is an occasional drinker. Pt lives with her mother, father, and daughter. Pt is currently unemployed. Denies PMHx of HTN or DM. denies ever seeing a cardiologist in the past and the pt doesn't have a PCP at this time. Pt is not currently on oxygen at home.  PCP none   Past Medical History  Diagnosis Date  . COPD (chronic obstructive pulmonary disease) ([place])   . Endometriosis   . Chronic pelvic pain in female   . Asthma   .  Syncope   . Suicide attempt ([place]) 2008    by phenergan overdose  . Polysubstance abuse     marijuana, cocaine, benzos   Past Surgical History  Procedure Laterality Date  . Abdominal hysterectomy    . Cholecystectomy     No family history on file. Social History  Substance Use Topics  . Smoking status: Current Every Day Smoker -- 0.50 packs/day    Types: Cigarettes  . Smokeless tobacco: Not on file  . Alcohol Use: No   Smokes 1 ppd unemployed  OB History    No data available     Review of Systems  Constitutional: Positive for diaphoresis (clammy).  Respiratory: Positive for shortness of breath.   Cardiovascular: Positive for chest pain.  Gastrointestinal: Negative for nausea and vomiting.  All other systems reviewed and are negative.     Allergies  Asa  Home Medications   none BP 105/75 mmHg  Pulse 76  Temp(Src) 97.8 F (36.6 C) (Oral)  Resp 17  SpO2 99%  Vital signs normal   Physical Exam  Constitutional: She is oriented to person, place, and time. She appears well-developed and well-nourished.  Non-toxic appearance. She does not appear ill. No distress.  HENT:  Head: Normocephalic and atraumatic.  Right Ear: External ear normal.  Left Ear: External ear normal.  Nose: Nose normal. No mucosal edema or rhinorrhea.  Mouth/Throat: Oropharynx is clear and moist and mucous membranes are normal. No dental abscesses or uvula swelling.  Eyes: Conjunctivae and EOM are normal. Pupils are equal, round, and reactive to light.  Neck: Normal range of motion and full passive range of motion without pain. Neck supple.  Cardiovascular: Regular rhythm and normal heart sounds.  Tachycardia present.  Exam reveals no gallop and no friction rub.   No murmur heard. Pulmonary/Chest: Effort normal and breath sounds normal. No respiratory distress. She has no wheezes. She has no rhonchi. She has no rales. She exhibits tenderness. She exhibits no crepitus.    Diffuse left sided  chest tenderness  Abdominal: Soft. Normal appearance and bowel sounds are normal. She exhibits no distension. There is no tenderness. There is no rebound and no guarding.  Musculoskeletal: Normal range of motion. She exhibits no edema or tenderness.  Moves all extremities well.   Neurological: She is alert and oriented to person, place, and time. She has normal strength. No cranial nerve deficit.  Skin: Skin is warm, dry and intact. No rash noted. No erythema. No pallor.  Psychiatric: She has a normal mood and affect. Her speech is normal and behavior is normal. Her mood appears not anxious.  Nursing note and vitals reviewed.   ED Course  Procedures (including critical care time)  Patient was given nitroglycerin paste, IV Toradol and oral Flexeril.  DIAGNOSTIC STUDIES: Oxygen Saturation is 96% on Ra, nl by my interpretation.    COORDINATION OF CARE: 1:59 AM Discussed treatment plan with pt at bedside which includes labs, CXR, EKG, and pt agreed to plan.  Patient was noted to have a very high white blood cell count. When I went back and talked to her she denied any cough or urinary symptoms. Her chest x-ray did not show pneumonia. Her urinalysis was normal. Her temperature had not been checked and it was normal when it was checked at 5 AM. We discussed repeat her CBC to see if it was possibly a stress reaction because patient was very anxious when she came to the ED. Patient is agreeable.  Repeat CBC is improving. Patient was given her test results. At this point she was advised she is having chest wall pain and she was sent home with a muscle relaxer.  Results for orders placed or performed during the hospital encounter of 03/02/15  Urine rapid drug screen (hosp performed)  Result Value Ref Range   Opiates NONE DETECTED NONE DETECTED   Cocaine NONE DETECTED NONE DETECTED   Benzodiazepines NONE DETECTED NONE DETECTED   Amphetamines NONE DETECTED NONE DETECTED   Tetrahydrocannabinol  POSITIVE (A) NONE DETECTED   Barbiturates NONE DETECTED NONE DETECTED  Urinalysis, Routine w reflex microscopic  Result Value Ref Range   Color, Urine YELLOW YELLOW   APPearance TURBID (A) CLEAR   Specific Gravity, Urine 1.007 1.005 - 1.030   pH 5.5 5.0 - 8.0   Glucose, UA NEGATIVE NEGATIVE mg/dL   Hgb urine dipstick MODERATE (A) NEGATIVE   Bilirubin Urine NEGATIVE NEGATIVE   Ketones, ur NEGATIVE NEGATIVE mg/dL   Protein, ur NEGATIVE NEGATIVE mg/dL   Nitrite NEGATIVE NEGATIVE   Leukocytes, UA SMALL (A) NEGATIVE  CBC  Result Value Ref Range   WBC 20.4 (H) 4.0 - 10.5 K/uL   RBC 3.99 3.87 - 5.11 MIL/uL   Hemoglobin 12.4 12.0 - 15.0 g/dL   HCT 38.0 36.0 - 46.0 %   MCV 95.2 78.0 -  100.0 fL   MCH 31.1 26.0 - 34.0 pg   MCHC 32.6 30.0 - 36.0 g/dL   RDW 13.9 11.5 - 15.5 %   Platelets 285 150 - 400 K/uL  Comprehensive metabolic panel  Result Value Ref Range   Sodium 139 135 - 145 mmol/L   Potassium 4.6 3.5 - 5.1 mmol/L   Chloride 105 101 - 111 mmol/L   CO2 23 22 - 32 mmol/L   Glucose, Bld 97 65 - 99 mg/dL   BUN 15 6 - 20 mg/dL   Creatinine, Ser 0.95 0.44 - 1.00 mg/dL   Calcium 8.5 (L) 8.9 - 10.3 mg/dL   Total Protein 4.9 (L) 6.5 - 8.1 g/dL   Albumin 2.9 (L) 3.5 - 5.0 g/dL   AST 21 15 - 41 U/L   ALT 10 (L) 14 - 54 U/L   Alkaline Phosphatase 54 38 - 126 U/L   Total Bilirubin 0.6 0.3 - 1.2 mg/dL   GFR calc non Af Amer >60 >60 mL/min   GFR calc Af Amer >60 >60 mL/min   Anion gap 11 5 - 15  Urine microscopic-add on  Result Value Ref Range   Squamous Epithelial / LPF 0-5 (A) NONE SEEN   WBC, UA 0-5 0 - 5 WBC/hpf   RBC / HPF 0-5 0 - 5 RBC/hpf   Bacteria, UA RARE (A) NONE SEEN  CBC with Differential  Result Value Ref Range   WBC 15.1 (H) 4.0 - 10.5 K/uL   RBC 3.74 (L) 3.87 - 5.11 MIL/uL   Hemoglobin 12.2 12.0 - 15.0 g/dL   HCT 35.8 (L) 36.0 - 46.0 %   MCV 95.7 78.0 - 100.0 fL   MCH 32.6 26.0 - 34.0 pg   MCHC 34.1 30.0 - 36.0 g/dL   RDW 14.0 11.5 - 15.5 %   Platelets 261 150  - 400 K/uL   Neutrophils Relative % 63 %   Neutro Abs 9.5 (H) 1.7 - 7.7 K/uL   Lymphocytes Relative 27 %   Lymphs Abs 4.1 (H) 0.7 - 4.0 K/uL   Monocytes Relative 6 %   Monocytes Absolute 1.0 0.1 - 1.0 K/uL   Eosinophils Relative 4 %   Eosinophils Absolute 0.5 0.0 - 0.7 K/uL   Basophils Relative 0 %   Basophils Absolute 0.0 0.0 - 0.1 K/uL  I-stat troponin, ED  Result Value Ref Range   Troponin i, poc 0.00 0.00 - 0.08 ng/mL   Comment 3          I-stat troponin, ED  Result Value Ref Range   Troponin i, poc 0.01 0.00 - 0.08 ng/mL   Comment 3            Laboratory interpretation all normal except improving leukocytosis   Dg Chest Portable 1 View  03/02/2015  CLINICAL DATA:  Acute onset of mid to left-sided chest pain. Cough. Initial encounter. EXAM: PORTABLE CHEST 1 VIEW COMPARISON:  Chest radiograph from 09/24/2014 FINDINGS: The lungs are well-aerated. Mild bibasilar atelectasis is noted. There is no evidence of pleural effusion or pneumothorax. The cardiomediastinal silhouette is borderline normal in size. No acute osseous abnormalities are seen. IMPRESSION: Mild bibasilar atelectasis noted.  Lungs otherwise clear. Electronically Signed   By: Garald Balding M.D.   On: 03/02/2015 02:40     I have personally reviewed and evaluated these images and lab results as part of my medical decision-making.     ED ECG REPORT   Date: 03/02/2015  Rate: 91  Rhythm:  normal sinus rhythm  QRS Axis: normal  Intervals: PR shortened  ST/T Wave abnormalities: normal  Conduction Disutrbances:none  Narrative Interpretation: low voltage precordial leads  Old EKG Reviewed: none available  I have personally reviewed the EKG tracing and agree with the computerized printout as noted.    MDM   Final diagnoses:  Chest wall pain    New Prescriptions   CYCLOBENZAPRINE (FLEXERIL) 10 MG TABLET    Take 1 tablet (10 mg total) by mouth 3 (three) times daily as needed (muscle soreness in chest).     Plan discharge  Rolland Porter, MD, Barbette Or, MD 03/02/15 [phone]

## 2015-03-02 NOTE — Discharge Instructions (Signed)
Use ice and heat on your chest wall for comfort. Take the flexeril for your chest wall soreness and pain.  Recheck if you get a fever, cough, struggle to breathe or feel worse.  Chest Wall Pain Chest wall pain is pain in or around the bones and muscles of your chest. Sometimes, an injury causes this pain. Sometimes, the cause may not be known. This pain may take several weeks or longer to get better. HOME CARE Pay attention to any changes in your symptoms. Take these actions to help with your pain:  Rest as told by your doctor.  Avoid activities that cause pain. Try not to use your chest, belly (abdominal), or side muscles to lift heavy things.  If directed, apply ice to the painful area:  Put ice in a plastic bag.  Place a towel between your skin and the bag.  Leave the ice on for 20 minutes, 2-3 times per day.  Take over-the-counter and prescription medicines only as told by your doctor.  Do not use tobacco products, including cigarettes, chewing tobacco, and e-cigarettes. If you need help quitting, ask your doctor.  Keep all follow-up visits as told by your doctor. This is important. GET HELP IF:  You have a fever.  Your chest pain gets worse.  You have new symptoms. GET HELP RIGHT AWAY IF:  You feel sick to your stomach (nauseous) or you throw up (vomit).  You feel sweaty or light-headed.  You have a cough with phlegm (sputum) or you cough up blood.  You are short of breath.   This information is not intended to replace advice given to you by your health care provider. Make sure you discuss any questions you have with your health care provider.   Document Released: 06/27/2007 Document Revised: 09/29/2014 Document Reviewed: 04/05/2014 Elsevier Interactive Patient Education [employer].

## 2015-03-03 DIAGNOSIS — Z139 Encounter for screening, unspecified: Secondary | ICD-10-CM

## 2015-03-07 ENCOUNTER — Ambulatory Visit: Payer: Self-pay | Admitting: Physician Assistant

## 2015-03-07 ENCOUNTER — Encounter: Payer: Self-pay | Admitting: Physician Assistant

## 2015-03-07 VITALS — BP 114/74 | HR 89 | Temp 97.9°F | Ht 59.25 in | Wt 184.8 lb

## 2015-03-07 DIAGNOSIS — R1013 Epigastric pain: Secondary | ICD-10-CM | POA: Insufficient documentation

## 2015-03-07 DIAGNOSIS — F1911 Other psychoactive substance abuse, in remission: Secondary | ICD-10-CM | POA: Insufficient documentation

## 2015-03-07 DIAGNOSIS — F17210 Nicotine dependence, cigarettes, uncomplicated: Secondary | ICD-10-CM | POA: Insufficient documentation

## 2015-03-07 DIAGNOSIS — R109 Unspecified abdominal pain: Secondary | ICD-10-CM

## 2015-03-07 DIAGNOSIS — Z13220 Encounter for screening for lipoid disorders: Secondary | ICD-10-CM

## 2015-03-07 DIAGNOSIS — K219 Gastro-esophageal reflux disease without esophagitis: Secondary | ICD-10-CM | POA: Insufficient documentation

## 2015-03-07 DIAGNOSIS — Z131 Encounter for screening for diabetes mellitus: Secondary | ICD-10-CM

## 2015-03-07 DIAGNOSIS — Z1239 Encounter for other screening for malignant neoplasm of breast: Secondary | ICD-10-CM

## 2015-03-07 DIAGNOSIS — R319 Hematuria, unspecified: Secondary | ICD-10-CM | POA: Insufficient documentation

## 2015-03-07 LAB — CBC
HEMATOCRIT: 39.5 % (ref 36.0–46.0)
HEMOGLOBIN: 13.5 g/dL (ref 12.0–15.0)
MCH: 32 pg (ref 26.0–34.0)
MCHC: 34.2 g/dL (ref 30.0–36.0)
MCV: 93.6 fL (ref 78.0–100.0)
MPV: 9.3 fL (ref 8.6–12.4)
PLATELETS: 342 10*3/uL (ref 150–400)
RBC: 4.22 MIL/uL (ref 3.87–5.11)
RDW: 13.9 % (ref 11.5–15.5)
WBC: 7.8 10*3/uL (ref 4.0–10.5)

## 2015-03-07 LAB — POCT URINALYSIS DIPSTICK
BILIRUBIN UA: NEGATIVE
Glucose, UA: NEGATIVE
Ketones, UA: NEGATIVE
Nitrite, UA: NEGATIVE
PH UA: 5.5
Protein, UA: NEGATIVE
Spec Grav, UA: <=1.005
UROBILINOGEN UA: 0.2

## 2015-03-07 LAB — LIPID PANEL
CHOLESTEROL: 272 mg/dL — AB (ref 125–200)
HDL: 52 mg/dL (ref >=46)
LDL Cholesterol: 186 mg/dL — ABNORMAL HIGH (ref <130)
TRIGLYCERIDES: 171 mg/dL — AB (ref <150)
Total CHOL/HDL Ratio: 5.2 Ratio — ABNORMAL HIGH (ref <=5.0)
VLDL: 34 mg/dL — AB (ref <30)

## 2015-03-07 LAB — GLUCOSE, POCT (MANUAL RESULT ENTRY): POC Glucose: 112 mg/dl — AB (ref 70–99)

## 2015-03-07 LAB — TSH: TSH: 2.39 m[IU]/L

## 2015-03-07 LAB — HEMOGLOBIN A1C
Hgb A1c MFr Bld: 5.7 % — ABNORMAL HIGH (ref <5.7)
Mean Plasma Glucose: 117 mg/dL — ABNORMAL HIGH (ref <117)

## 2015-03-07 MED ORDER — OMEPRAZOLE 40 MG PO CPDR: 40.0000 mg | DELAYED_RELEASE_CAPSULE | Freq: Every day | ORAL | Status: DC

## 2015-03-07 NOTE — Patient Instructions (Addendum)
Smoking Cessation, Tips for Success If you are ready to quit smoking, congratulations! You have chosen to help yourself be healthier. Cigarettes bring nicotine, tar, carbon monoxide, and other irritants into your body. Your lungs, heart, and blood vessels will be able to work better without these poisons. There are many different ways to quit smoking. Nicotine gum, nicotine patches, a nicotine inhaler, or nicotine nasal spray can help with physical craving. Hypnosis, support groups, and medicines help break the habit of smoking. WHAT THINGS CAN I DO TO MAKE QUITTING EASIER?  Here are some tips to help you quit for good:  Pick a date when you will quit smoking completely. Tell all of your friends and family about your plan to quit on that date.  Do not try to slowly cut down on the number of cigarettes you are smoking. Pick a quit date and quit smoking completely starting on that day.  Throw away all cigarettes.   Clean and remove all ashtrays from your home, work, and car.  On a card, write down your reasons for quitting. Carry the card with you and read it when you get the urge to smoke.  Cleanse your body of nicotine. Drink enough water and fluids to keep your urine clear or pale yellow. Do this after quitting to flush the nicotine from your body.  Learn to predict your moods. Do not let a bad situation be your excuse to have a cigarette. Some situations in your life might tempt you into wanting a cigarette.  Never have "just one" cigarette. It leads to wanting another and another. Remind yourself of your decision to quit.  Change habits associated with smoking. If you smoked while driving or when feeling stressed, try other activities to replace smoking. Stand up when drinking your coffee. Brush your teeth after eating. Sit in a different chair when you read the paper. Avoid alcohol while trying to quit, and try to drink fewer caffeinated beverages. Alcohol and caffeine may urge you to  smoke.  Avoid foods and drinks that can trigger a desire to smoke, such as sugary or spicy foods and alcohol.  Ask people who smoke not to smoke around you.  Have something planned to do right after eating or having a cup of coffee. For example, plan to take a walk or exercise.  Try a relaxation exercise to calm you down and decrease your stress. Remember, you may be tense and nervous for the first 2 weeks after you quit, but this will pass.  Find new activities to keep your hands busy. Play with a pen, coin, or rubber band. Doodle or draw things on paper.  Brush your teeth right after eating. This will help cut down on the craving for the taste of tobacco after meals. You can also try mouthwash.   Use oral substitutes in place of cigarettes. Try using lemon drops, carrots, cinnamon sticks, or chewing gum. Keep them handy so they are available when you have the urge to smoke.  When you have the urge to smoke, try deep breathing.  Designate your home as a nonsmoking area.  If you are a heavy smoker, ask your health care provider about a prescription for nicotine chewing gum. It can ease your withdrawal from nicotine.  Reward yourself. Set aside the cigarette money you save and buy yourself something nice.  Look for support from others. Join a support group or smoking cessation program. Ask someone at home or at work to help you with your plan   to quit smoking.  Always ask yourself, "Do I need this cigarette or is this just a reflex?" Tell yourself, "Today, I choose not to smoke," or "I do not want to smoke." You are reminding yourself of your decision to quit.  Do not replace cigarette smoking with electronic cigarettes (commonly called e-cigarettes). The safety of e-cigarettes is unknown, and some may contain harmful chemicals.  If you relapse, do not give up! Plan ahead and think about what you will do the next time you get the urge to smoke. HOW WILL I FEEL WHEN I QUIT SMOKING? You  may have symptoms of withdrawal because your body is used to nicotine (the addictive substance in cigarettes). You may crave cigarettes, be irritable, feel very hungry, cough often, get headaches, or have difficulty concentrating. The withdrawal symptoms are only temporary. They are strongest when you first quit but will go away within 10-14 days. When withdrawal symptoms occur, stay in control. Think about your reasons for quitting. Remind yourself that these are signs that your body is healing and getting used to being without cigarettes. Remember that withdrawal symptoms are easier to treat than the major diseases that smoking can cause.  Even after the withdrawal is over, expect periodic urges to smoke. However, these cravings are generally short lived and will go away whether you smoke or not. Do not smoke! WHAT RESOURCES ARE AVAILABLE TO HELP ME QUIT SMOKING? Your health care provider can direct you to community resources or hospitals for support, which may include:  Group support.  Education.  Hypnosis.  Therapy.   This information is not intended to replace advice given to you by your health care provider. Make sure you discuss any questions you have with your health care provider.   Document Released: 10/07/2003 Document Revised: 01/29/2014 Document Reviewed: 06/26/2012 Elsevier Interactive Patient Education 2016 [place].   Gastroesophageal Reflux Disease, Adult Normally, food travels down the esophagus and stays in the stomach to be digested. However, when a person has gastroesophageal reflux disease (GERD), food and stomach acid move back up into the esophagus. When this happens, the esophagus becomes sore and inflamed. Over time, GERD can create small holes (ulcers) in the lining of the esophagus.  CAUSES This condition is caused by a problem with the muscle between the esophagus and the stomach (lower esophageal sphincter, or LES). Normally, the LES muscle closes after food  passes through the esophagus to the stomach. When the LES is weakened or abnormal, it does not close properly, and that allows food and stomach acid to go back up into the esophagus. The LES can be weakened by certain dietary substances, medicines, and medical conditions, including:  Tobacco use.  Pregnancy.  Having a hiatal hernia.  Heavy alcohol use.  Certain foods and beverages, such as coffee, chocolate, onions, and peppermint. RISK FACTORS This condition is more likely to develop in:  People who have an increased body weight.  People who have connective tissue disorders.  People who use NSAID medicines. SYMPTOMS Symptoms of this condition include:  Heartburn.  Difficult or painful swallowing.  The feeling of having a lump in the throat.  Abitter taste in the mouth.  Bad breath.  Having a large amount of saliva.  Having an upset or bloated stomach.  Belching.  Chest pain.  Shortness of breath or wheezing.  Ongoing (chronic) cough or a night-time cough.  Wearing away of tooth enamel.  Weight loss. Different conditions can cause chest pain. Make sure to see your  health care provider if you experience chest pain. DIAGNOSIS Your health care provider will take a medical history and perform a physical exam. To determine if you have mild or severe GERD, your health care provider may also monitor how you respond to treatment. You may also have other tests, including:  An endoscopy toexamine your stomach and esophagus with a small camera.  A test thatmeasures the acidity level in your esophagus.  A test thatmeasures how much pressure is on your esophagus.  A barium swallow or modified barium swallow to show the shape, size, and functioning of your esophagus. TREATMENT The goal of treatment is to help relieve your symptoms and to prevent complications. Treatment for this condition may vary depending on how severe your symptoms are. Your health care provider  may recommend:  Changes to your diet.  Medicine.  Surgery. HOME CARE INSTRUCTIONS Diet  Follow a diet as recommended by your health care provider. This may involve avoiding foods and drinks such as:  Coffee and tea (with or without caffeine).  Drinks that containalcohol.  Energy drinks and sports drinks.  Carbonated drinks or sodas.  Chocolate and cocoa.  Peppermint and mint flavorings.  Garlic and onions.  Horseradish.  Spicy and acidic foods, including peppers, chili powder, curry powder, vinegar, hot sauces, and barbecue sauce.  Citrus fruit juices and citrus fruits, such as oranges, lemons, and limes.  Tomato-based foods, such as red sauce, chili, salsa, and pizza with red sauce.  Fried and fatty foods, such as donuts, french fries, potato chips, and high-fat dressings.  High-fat meats, such as hot dogs and fatty cuts of red and white meats, such as rib eye steak, sausage, ham, and bacon.  High-fat dairy items, such as whole milk, butter, and cream cheese.  Eat small, frequent meals instead of large meals.  Avoid drinking large amounts of liquid with your meals.  Avoid eating meals during the 2-3 hours before bedtime.  Avoid lying down right after you eat.  Do not exercise right after you eat. General Instructions  Pay attention to any changes in your symptoms.  Take over-the-counter and prescription medicines only as told by your health care provider. Do not take aspirin, ibuprofen, or other NSAIDs unless your health care provider told you to do so.  Do not use any tobacco products, including cigarettes, chewing tobacco, and e-cigarettes. If you need help quitting, ask your health care provider.  Wear loose-fitting clothing. Do not wear anything tight around your waist that causes pressure on your abdomen.  Raise (elevate) the head of your bed 6 inches (15cm).  Try to reduce your stress, such as with yoga or meditation. If you need help reducing  stress, ask your health care provider.  If you are overweight, reduce your weight to an amount that is healthy for you. Ask your health care provider for guidance about a safe weight loss goal.  Keep all follow-up visits as told by your health care provider. This is important. SEEK MEDICAL CARE IF:  You have new symptoms.  You have unexplained weight loss.  You have difficulty swallowing, or it hurts to swallow.  You have wheezing or a persistent cough.  Your symptoms do not improve with treatment.  You have a hoarse voice. SEEK IMMEDIATE MEDICAL CARE IF:  You have pain in your arms, neck, jaw, teeth, or back.  You feel sweaty, dizzy, or light-headed.  You have chest pain or shortness of breath.  You vomit and your vomit looks like blood  or coffee grounds.  You faint.  Your stool is bloody or black.  You cannot swallow, drink, or eat.   This information is not intended to replace advice given to you by your health care provider. Make sure you discuss any questions you have with your health care provider.   Document Released: 10/18/2004 Document Revised: 09/29/2014 Document Reviewed: 05/05/2014 Elsevier Interactive Patient Education [employer].

## 2015-03-07 NOTE — Progress Notes (Signed)
BP 114/74 mmHg  Pulse 89  Temp(Src) 97.9 F (36.6 C)  Ht 4' 11.25" (1.505 m)  Wt 184 lb 12 oz (83.802 kg)  BMI 37.00 kg/m2  SpO2 99%   Subjective:    Patient ID: Pamela Livingston, female    DOB: 01-09-1966, 50 y.o.   MRN: [id number]  HPI: Pamela Livingston is a 50 y.o. female presenting on 03/07/2015 for New Patient (Initial Visit); Back Pain; and Dysuria   HPI  Chief Complaint  Patient presents with  . New Patient (Initial Visit)    has not been to a regular doctor in about 20 years  . Back Pain    with radiating pain toward L leg  . Dysuria    pt states she might have a kidney infection   Pt states no pain with urination.  She has noticed odor to urine for past few days.  She is aware she has hx hematuria but she has never been seen by urologist for this.  Pt noted to have been to ER several times last year.  Pt states last week she was getting ready for bed.  Pt states that the flexeril has taken care of her cp.  Pt states mother had cabg age 47. She also has afib.  and father had MI age 26 but has not had cabg.  Pt has never had a mammogram  Relevant past medical, surgical, family and social history reviewed and updated as indicated. Interim medical history since our last visit reviewed. Allergies and medications reviewed and updated.   Current outpatient prescriptions:  .  cyclobenzaprine (FLEXERIL) 10 MG tablet, Take 1 tablet (10 mg total) by mouth 3 (three) times daily as needed (muscle soreness in chest)., Disp: 30 tablet, Rfl: 0 .  diphenhydrAMINE (BENADRYL) 25 MG tablet, Take 50 mg by mouth at bedtime. , Disp: , Rfl:    Review of Systems  Constitutional: Positive for unexpected weight change. Negative for fever, chills, diaphoresis, appetite change and fatigue.  HENT: Positive for dental problem. Negative for congestion, drooling, ear pain, facial swelling, hearing loss, mouth sores, sneezing, sore throat, trouble swallowing and voice change.   Eyes: Negative for pain,  discharge, redness, itching and visual disturbance.  Respiratory: Positive for shortness of breath and wheezing. Negative for cough and choking.   Cardiovascular: Positive for leg swelling. Negative for chest pain and palpitations.  Gastrointestinal: Negative for vomiting, abdominal pain, diarrhea, constipation and blood in stool.  Endocrine: Positive for polydipsia. Negative for cold intolerance and heat intolerance.  Genitourinary: Negative for dysuria, hematuria and decreased urine volume.  Musculoskeletal: Positive for back pain, arthralgias and gait problem.  Skin: Negative for rash.  Allergic/Immunologic: Negative for environmental allergies.  Neurological: Negative for seizures, syncope, light-headedness and headaches.  Hematological: Negative for adenopathy.  Psychiatric/Behavioral: Negative for suicidal ideas, dysphoric mood and agitation. The patient is not nervous/anxious.     Per HPI unless specifically indicated above     Objective:    BP 114/74 mmHg  Pulse 89  Temp(Src) 97.9 F (36.6 C)  Ht 4' 11.25" (1.505 m)  Wt 184 lb 12 oz (83.802 kg)  BMI 37.00 kg/m2  SpO2 99%  Wt Readings from Last 3 Encounters:  03/07/15 184 lb 12 oz (83.802 kg)  12/05/14 177 lb (80.287 kg)  06/03/14 175 lb (79.379 kg)    Physical Exam  Constitutional: She is oriented to person, place, and time. She appears well-developed and well-nourished.  HENT:  Head: Normocephalic and atraumatic.  Mouth/Throat: Oropharynx  is clear and moist. No oropharyngeal exudate.  Eyes: Conjunctivae and EOM are normal. Pupils are equal, round, and reactive to light.  Neck: Neck supple. No thyromegaly present.  Cardiovascular: Normal rate and regular rhythm.   Pulmonary/Chest: Effort normal and breath sounds normal.  Abdominal: Soft. Bowel sounds are normal. She exhibits no mass. There is no hepatosplenomegaly. There is tenderness in the epigastric area. There is no rigidity, no rebound, no guarding and no CVA  tenderness.  Musculoskeletal: She exhibits no edema.  Lymphadenopathy:    She has no cervical adenopathy.  Neurological: She is alert and oriented to person, place, and time. Gait normal.  Skin: Skin is warm and dry.  Psychiatric: She has a normal mood and affect. Her behavior is normal.  Vitals reviewed.      Results for orders placed or performed in visit on 03/07/15  POCT Urinalysis Dipstick  Result Value Ref Range   Color, UA LIGHT YELLOW    Clarity, UA CLEAR    Glucose, UA N    Bilirubin, UA N    Ketones, UA N    Spec Grav, UA <=1.005    Blood, UA LARGE    pH, UA 5.5    Protein, UA N    Urobilinogen, UA 0.2    Nitrite, UA N    Leukocytes, UA Trace (A) Negative  POCT Glucose (CBG)  Result Value Ref Range   POC Glucose 112 (A) 70 - 99 mg/dl      Assessment & Plan:   Encounter Diagnoses  Name Primary?  . Hematuria Yes  . Abdominal pain, epigastric   . Flank pain   . Screening for diabetes mellitus   . Screening cholesterol level   . Cigarette nicotine dependence, uncomplicated   . History of substance abuse   . Gastroesophageal reflux disease, esophagitis presence not specified   . Screening for breast cancer     -Pt given [facility] discount application -She needs referral to urology for persistent hematuria -Order mammogram -Get blood draw today for baseline labs -counseled pt on Smoking cessation -Sign up for [facility] and order omeprazole -f/u 1 month. Will continue with these conditions and discuss more of her many complaints at that appointment

## 2015-03-09 LAB — HEPATITIS PANEL, ACUTE
HCV AB: NEGATIVE
HEP B S AG: NEGATIVE
Hep A IgM: NONREACTIVE
Hep B C IgM: NONREACTIVE

## 2015-04-04 ENCOUNTER — Ambulatory Visit (HOSPITAL_COMMUNITY): Admission: RE | Admit: 2015-04-04 | Payer: Self-pay | Source: Ambulatory Visit

## 2015-04-04 ENCOUNTER — Ambulatory Visit: Payer: Self-pay | Admitting: Physician Assistant

## 2015-04-04 ENCOUNTER — Ambulatory Visit
Admission: RE | Admit: 2015-04-04 | Discharge: 2015-04-04 | Disposition: A | Discharge status: Home / Self Care | Payer: Self-pay | Source: Ambulatory Visit | Attending: Physician Assistant | Admitting: Physician Assistant

## 2015-04-04 ENCOUNTER — Other Ambulatory Visit: Payer: Self-pay | Admitting: Physician Assistant

## 2015-04-04 ENCOUNTER — Ambulatory Visit (HOSPITAL_COMMUNITY)
Admission: RE | Admit: 2015-04-04 | Discharge: 2015-04-04 | Disposition: A | Discharge status: Home / Self Care | Payer: Self-pay | Source: Ambulatory Visit | Attending: Physician Assistant | Admitting: Physician Assistant

## 2015-04-04 ENCOUNTER — Ambulatory Visit: Payer: Self-pay

## 2015-04-04 DIAGNOSIS — Z1231 Encounter for screening mammogram for malignant neoplasm of breast: Secondary | ICD-10-CM

## 2015-04-05 ENCOUNTER — Ambulatory Visit: Payer: Self-pay | Admitting: Physician Assistant

## 2015-04-06 ENCOUNTER — Other Ambulatory Visit: Payer: Self-pay | Admitting: Physician Assistant

## 2015-04-06 DIAGNOSIS — R928 Other abnormal and inconclusive findings on diagnostic imaging of breast: Secondary | ICD-10-CM

## 2015-04-07 ENCOUNTER — Ambulatory Visit: Payer: Self-pay | Admitting: Physician Assistant

## 2015-04-07 ENCOUNTER — Encounter: Payer: Self-pay | Admitting: Physician Assistant

## 2015-04-07 VITALS — BP 118/80 | HR 103 | Temp 98.1°F | Ht 59.25 in | Wt 187.7 lb

## 2015-04-07 DIAGNOSIS — E785 Hyperlipidemia, unspecified: Secondary | ICD-10-CM

## 2015-04-07 DIAGNOSIS — K219 Gastro-esophageal reflux disease without esophagitis: Secondary | ICD-10-CM

## 2015-04-07 DIAGNOSIS — R928 Other abnormal and inconclusive findings on diagnostic imaging of breast: Secondary | ICD-10-CM

## 2015-04-07 DIAGNOSIS — R319 Hematuria, unspecified: Secondary | ICD-10-CM

## 2015-04-07 DIAGNOSIS — F17219 Nicotine dependence, cigarettes, with unspecified nicotine-induced disorders: Secondary | ICD-10-CM

## 2015-04-07 DIAGNOSIS — J449 Chronic obstructive pulmonary disease, unspecified: Secondary | ICD-10-CM

## 2015-04-07 MED ORDER — ALBUTEROL SULFATE HFA 108 (90 BASE) MCG/ACT IN AERS: 2.0000 | INHALATION_SPRAY | Freq: Four times a day (QID) | RESPIRATORY_TRACT | Status: DC | PRN

## 2015-04-07 MED ORDER — MOMETASONE FURO-FORMOTEROL FUM 100-5 MCG/ACT IN AERO: 2.0000 | INHALATION_SPRAY | Freq: Two times a day (BID) | RESPIRATORY_TRACT | Status: DC

## 2015-04-07 MED ORDER — MONTELUKAST SODIUM 10 MG PO TABS: 10.0000 mg | ORAL_TABLET | Freq: Every day | ORAL | Status: DC

## 2015-04-07 MED ORDER — ATORVASTATIN CALCIUM 20 MG PO TABS: 20.0000 mg | ORAL_TABLET | Freq: Every day | ORAL | Status: DC

## 2015-04-07 NOTE — Progress Notes (Signed)
BP 118/80 mmHg  Pulse 103  Temp(Src) 98.1 F (36.7 C)  Ht 4' 11.25" (1.505 m)  Wt 187 lb 11.2 oz (85.14 kg)  BMI 37.59 kg/m2  SpO2 98%   Subjective:    Patient ID: Pamela Livingston, female    DOB: 05/24/1965, 50 y.o.   MRN: [id number]  HPI: Pamela Livingston is a 50 y.o. female presenting on 04/07/2015 for Gastroesophageal Reflux; Hematuria; and Back Pain   HPI Pt states she has been on omeprazole almost a month- states it helps a lot.  Pt has not yet turned in [facility] discount application- says she needs to get something notarized.-  She needs to be referred to urology for hematuria.   Relevant past medical, surgical, family and social history reviewed and updated as indicated. Interim medical history since our last visit reviewed. Allergies and medications reviewed and updated.  Current outpatient prescriptions:  .  diphenhydrAMINE (BENADRYL) 25 MG tablet, Take 50 mg by mouth at bedtime. , Disp: , Rfl:  .  omeprazole (PRILOSEC) 40 MG capsule, Take 1 capsule (40 mg total) by mouth daily., Disp: 90 capsule, Rfl: 1  Review of Systems  Constitutional: Negative for fever, chills, diaphoresis, appetite change, fatigue and unexpected weight change.  HENT: Positive for congestion, sneezing and sore throat. Negative for dental problem, drooling, ear pain, facial swelling, hearing loss, mouth sores, trouble swallowing and voice change.   Eyes: Negative for pain, discharge, redness, itching and visual disturbance.  Respiratory: Positive for cough, shortness of breath and wheezing. Negative for choking.   Cardiovascular: Negative for chest pain, palpitations and leg swelling.  Gastrointestinal: Negative for vomiting, abdominal pain, diarrhea, constipation and blood in stool.  Endocrine: Negative for cold intolerance, heat intolerance and polydipsia.  Genitourinary: Negative for dysuria, hematuria and decreased urine volume.  Musculoskeletal: Positive for back pain. Negative for arthralgias and gait  problem.  Skin: Negative for rash.  Allergic/Immunologic: Negative for environmental allergies.  Neurological: Negative for seizures, syncope, light-headedness and headaches.  Hematological: Negative for adenopathy.  Psychiatric/Behavioral: Negative for suicidal ideas, dysphoric mood and agitation. The patient is not nervous/anxious.     Per HPI unless specifically indicated above     Objective:    BP 118/80 mmHg  Pulse 103  Temp(Src) 98.1 F (36.7 C)  Ht 4' 11.25" (1.505 m)  Wt 187 lb 11.2 oz (85.14 kg)  BMI 37.59 kg/m2  SpO2 98%  Wt Readings from Last 3 Encounters:  04/07/15 187 lb 11.2 oz (85.14 kg)  03/07/15 184 lb 12 oz (83.802 kg)  12/05/14 177 lb (80.287 kg)    Physical Exam  Constitutional: She is oriented to person, place, and time. She appears well-developed and well-nourished.  HENT:  Head: Normocephalic and atraumatic.  Neck: Neck supple.  Cardiovascular: Normal rate and regular rhythm.   Pulmonary/Chest: Effort normal and breath sounds normal.  Abdominal: Soft. Bowel sounds are normal. She exhibits no mass. There is no hepatosplenomegaly. There is no tenderness.  Musculoskeletal: She exhibits no edema.  Lymphadenopathy:    She has no cervical adenopathy.  Neurological: She is alert and oriented to person, place, and time.  Skin: Skin is warm and dry.  Psychiatric: She has a normal mood and affect. Her behavior is normal.  Vitals reviewed.   Results for orders placed or performed in visit on 03/07/15  Lipid Profile  Result Value Ref Range   Cholesterol 272 (H) 125 - 200 mg/dL   Triglycerides 171 (H) <150 mg/dL   HDL 52 >=46 mg/dL  Total CHOL/HDL Ratio 5.2 (H) <=5.0 Ratio   VLDL 34 (H) <30 mg/dL   LDL Cholesterol 186 (H) <130 mg/dL  HgB A1c  Result Value Ref Range   Hgb A1c MFr Bld 5.7 (H) <5.7 %   Mean Plasma Glucose 117 (H) <117 mg/dL  TSH  Result Value Ref Range   TSH 2.39 mIU/L  CBC  Result Value Ref Range   WBC 7.8 4.0 - 10.5 K/uL   RBC  4.22 3.87 - 5.11 MIL/uL   Hemoglobin 13.5 12.0 - 15.0 g/dL   HCT 39.5 36.0 - 46.0 %   MCV 93.6 78.0 - 100.0 fL   MCH 32.0 26.0 - 34.0 pg   MCHC 34.2 30.0 - 36.0 g/dL   RDW 13.9 11.5 - 15.5 %   Platelets 342 150 - 400 K/uL   MPV 9.3 8.6 - 12.4 fL  Hepatitis, Acute  Result Value Ref Range   Hepatitis B Surface Ag NEGATIVE NEGATIVE   HCV Ab NEGATIVE NEGATIVE   Hep B C IgM NON REACTIVE NON REACTIVE   Hep A IgM NON REACTIVE NON REACTIVE  POCT Urinalysis Dipstick  Result Value Ref Range   Color, UA LIGHT YELLOW    Clarity, UA CLEAR    Glucose, UA N    Bilirubin, UA N    Ketones, UA N    Spec Grav, UA <=1.005    Blood, UA LARGE    pH, UA 5.5    Protein, UA N    Urobilinogen, UA 0.2    Nitrite, UA N    Leukocytes, UA Trace (A) Negative  POCT Glucose (CBG)  Result Value Ref Range   POC Glucose 112 (A) 70 - 99 mg/dl      Assessment & Plan:   Encounter Diagnoses  Name Primary?  . Gastroesophageal reflux disease, esophagitis presence not specified Yes  . Abnormal mammogram   . Hyperlipidemia   . Hematuria   . Cigarette nicotine dependence with nicotine-induced disorder   . Chronic obstructive pulmonary disease, unspecified COPD type ([place])     -reviewed labs with pt -Discussed mammo results- she should be hearing from [facility] program within the week about follow-up -Continue omeprazole -rx lipitor and gave lowfat diet sheet -Pt to turn in [facility] discount application- awaiting appointment for urology  -Counseled smoking cesstion.  rx singulair and albuterol mdi -f/u 6 weeks to recheck.  RTO sooner prn

## 2015-04-07 NOTE — Patient Instructions (Addendum)
Continue omeprazole  Will get in mail from [place] (montelukast), dulera MDI, proventil MDI, atorvastatin (lipitor) If haven't heard on follow-up mammogram in one week, call our office Turn in [facility] discount application   Fat and Cholesterol Restricted Diet High levels of fat and cholesterol in your blood may lead to various health problems, such as diseases of the heart, blood vessels, gallbladder, liver, and pancreas. Fats are concentrated sources of energy that come in various forms. Certain types of fat, including saturated fat, may be harmful in excess. Cholesterol is a substance needed by your body in small amounts. Your body makes all the cholesterol it needs. Excess cholesterol comes from the food you eat. When you have high levels of cholesterol and saturated fat in your blood, health problems can develop because the excess fat and cholesterol will gather along the walls of your blood vessels, causing them to narrow. Choosing the right foods will help you control your intake of fat and cholesterol. This will help keep the levels of these substances in your blood within normal limits and reduce your risk of disease. WHAT IS MY PLAN? Your health care provider recommends that you:  Get no more than __________ % of the total calories in your daily diet from fat.  Limit your intake of saturated fat to less than ______% of your total calories each day.  Limit the amount of cholesterol in your diet to less than _________mg per day. WHAT TYPES OF FAT SHOULD I CHOOSE?  Choose healthy fats more often. Choose monounsaturated and polyunsaturated fats, such as olive and canola oil, flaxseeds, walnuts, almonds, and seeds.  Eat more omega-3 fats. Good choices include salmon, mackerel, sardines, tuna, flaxseed oil, and ground flaxseeds. Aim to eat fish at least two times a week.  Limit saturated fats. Saturated fats are primarily found in animal products, such as meats, butter, and cream.  Plant sources of saturated fats include palm oil, palm kernel oil, and coconut oil.  Avoid foods with partially hydrogenated oils in them. These contain trans fats. Examples of foods that contain trans fats are stick margarine, some tub margarines, cookies, crackers, and other baked goods. WHAT GENERAL GUIDELINES DO I NEED TO FOLLOW? These guidelines for healthy eating will help you control your intake of fat and cholesterol:  Check food labels carefully to identify foods with trans fats or high amounts of saturated fat.  Fill one half of your plate with vegetables and green salads.  Fill one fourth of your plate with whole grains. Look for the word "whole" as the first word in the ingredient list.  Fill one fourth of your plate with lean protein foods.  Limit fruit to two servings a day. Choose fruit instead of juice.  Eat more foods that contain soluble fiber. Examples of foods that contain this type of fiber are apples, broccoli, carrots, beans, peas, and barley. Aim to get 20-30 g of fiber per day.  Eat more home-cooked food and less restaurant, buffet, and fast food.  Limit or avoid alcohol.  Limit foods high in starch and sugar.  Limit fried foods.  Kanan foods using methods other than frying. Baking, boiling, grilling, and broiling are all great options.  Lose weight if you are overweight. Losing just 5-10% of your initial body weight can help your overall health and prevent diseases such as diabetes and heart disease. WHAT FOODS CAN I EAT? Grains Whole grains, such as whole wheat or whole grain breads, crackers, cereals, and pasta. Unsweetened oatmeal,  bulgur, barley, quinoa, or brown rice. Corn or whole wheat flour tortillas. Vegetables Fresh or frozen vegetables (raw, steamed, roasted, or grilled). Green salads. Fruits All fresh, canned (in natural juice), or frozen fruits. Meat and Other Protein Products Ground beef (85% or leaner), grass-fed beef, or beef trimmed of  fat. Skinless chicken or [place]. Ground chicken or [place]. Pork trimmed of fat. All fish and seafood. Eggs. Dried beans, peas, or lentils. Unsalted nuts or seeds. Unsalted canned or dry beans. Dairy Low-fat dairy products, such as skim or 1% milk, 2% or reduced-fat cheeses, low-fat ricotta or cottage cheese, or plain low-fat yogurt. Fats and Oils Tub margarines without trans fats. Light or reduced-fat mayonnaise and salad dressings. Avocado. Olive, canola, sesame, or safflower oils. Natural peanut or almond butter (choose ones without added sugar and oil). The items listed above may not be a complete list of recommended foods or beverages. Contact your dietitian for more options. WHAT FOODS ARE NOT RECOMMENDED? Grains White bread. White pasta. White rice. Cornbread. Bagels, pastries, and croissants. Crackers that contain trans fat. Vegetables White potatoes. Corn. Creamed or fried vegetables. Vegetables in a cheese sauce. Fruits Dried fruits. Canned fruit in light or heavy syrup. Fruit juice. Meat and Other Protein Products Fatty cuts of meat. Ribs, chicken wings, bacon, sausage, bologna, salami, chitterlings, fatback, hot dogs, bratwurst, and packaged luncheon meats. Liver and organ meats. Dairy Whole or 2% milk, cream, half-and-half, and cream cheese. Whole milk cheeses. Whole-fat or sweetened yogurt. Full-fat cheeses. Nondairy creamers and whipped toppings. Processed cheese, cheese spreads, or cheese curds. Sweets and Desserts Corn syrup, sugars, honey, and molasses. Candy. Jam and jelly. Syrup. Sweetened cereals. Cookies, pies, cakes, donuts, muffins, and ice cream. Fats and Oils Butter, stick margarine, lard, shortening, ghee, or bacon fat. Coconut, palm kernel, or palm oils. Beverages Alcohol. Sweetened drinks (such as sodas, lemonade, and fruit drinks or punches). The items listed above may not be a complete list of foods and beverages to avoid. Contact your dietitian for more  information.   This information is not intended to replace advice given to you by your health care provider. Make sure you discuss any questions you have with your health care provider.   Document Released: 01/08/2005 Document Revised: 01/29/2014 Document Reviewed: 04/08/2013 Elsevier Interactive Patient Education [employer].

## 2015-04-15 ENCOUNTER — Other Ambulatory Visit: Payer: Self-pay | Admitting: Physician Assistant

## 2015-04-15 ENCOUNTER — Other Ambulatory Visit: Payer: Self-pay | Admitting: Internal Medicine

## 2015-04-15 DIAGNOSIS — J449 Chronic obstructive pulmonary disease, unspecified: Secondary | ICD-10-CM | POA: Insufficient documentation

## 2015-04-15 DIAGNOSIS — R921 Mammographic calcification found on diagnostic imaging of breast: Secondary | ICD-10-CM

## 2015-04-15 DIAGNOSIS — F17219 Nicotine dependence, cigarettes, with unspecified nicotine-induced disorders: Secondary | ICD-10-CM | POA: Insufficient documentation

## 2015-04-15 DIAGNOSIS — R928 Other abnormal and inconclusive findings on diagnostic imaging of breast: Secondary | ICD-10-CM | POA: Insufficient documentation

## 2015-04-15 DIAGNOSIS — E785 Hyperlipidemia, unspecified: Secondary | ICD-10-CM | POA: Insufficient documentation

## 2015-04-19 ENCOUNTER — Encounter (HOSPITAL_COMMUNITY): Payer: Self-pay

## 2015-04-19 ENCOUNTER — Ambulatory Visit (HOSPITAL_COMMUNITY)
Admission: RE | Admit: 2015-04-19 | Discharge: 2015-04-19 | Disposition: A | Discharge status: Home / Self Care | Payer: PRIVATE HEALTH INSURANCE | Source: Ambulatory Visit | Attending: Physician Assistant | Admitting: Physician Assistant

## 2015-04-19 DIAGNOSIS — R928 Other abnormal and inconclusive findings on diagnostic imaging of breast: Secondary | ICD-10-CM | POA: Insufficient documentation

## 2015-05-10 ENCOUNTER — Other Ambulatory Visit: Payer: Self-pay | Admitting: Student

## 2015-05-10 ENCOUNTER — Ambulatory Visit: Payer: Self-pay | Admitting: Physician Assistant

## 2015-05-10 ENCOUNTER — Ambulatory Visit (HOSPITAL_COMMUNITY)
Admission: RE | Admit: 2015-05-10 | Discharge: 2015-05-10 | Disposition: A | Discharge status: Home / Self Care | Payer: PRIVATE HEALTH INSURANCE | Source: Ambulatory Visit | Attending: Physician Assistant | Admitting: Physician Assistant

## 2015-05-10 ENCOUNTER — Encounter: Payer: Self-pay | Admitting: Physician Assistant

## 2015-05-10 VITALS — BP 112/76 | HR 84 | Temp 97.7°F | Ht 59.25 in | Wt 182.4 lb

## 2015-05-10 DIAGNOSIS — M722 Plantar fascial fibromatosis: Secondary | ICD-10-CM

## 2015-05-10 DIAGNOSIS — R319 Hematuria, unspecified: Secondary | ICD-10-CM

## 2015-05-10 DIAGNOSIS — M7989 Other specified soft tissue disorders: Secondary | ICD-10-CM | POA: Insufficient documentation

## 2015-05-10 DIAGNOSIS — M79662 Pain in left lower leg: Secondary | ICD-10-CM

## 2015-05-10 DIAGNOSIS — E785 Hyperlipidemia, unspecified: Secondary | ICD-10-CM

## 2015-05-10 DIAGNOSIS — K219 Gastro-esophageal reflux disease without esophagitis: Secondary | ICD-10-CM

## 2015-05-10 DIAGNOSIS — F17219 Nicotine dependence, cigarettes, with unspecified nicotine-induced disorders: Secondary | ICD-10-CM

## 2015-05-10 NOTE — Patient Instructions (Signed)
Plantar Fasciitis Plantar fasciitis is a painful foot condition that affects the heel. It occurs when the band of tissue that connects the toes to the heel bone (plantar fascia) becomes irritated. This can happen after exercising too much or doing other repetitive activities (overuse injury). The pain from plantar fasciitis can range from mild irritation to severe pain that makes it difficult for you to walk or move. The pain is usually worse in the morning or after you have been sitting or lying down for a while. CAUSES This condition may be caused by:  Standing for long periods of time.  Wearing shoes that do not fit.  Doing high-impact activities, including running, aerobics, and ballet.  Being overweight.  Having an abnormal way of walking (gait).  Having tight calf muscles.  Having high arches in your feet.  Starting a new athletic activity. SYMPTOMS The main symptom of this condition is heel pain. Other symptoms include:  Pain that gets worse after activity or exercise.  Pain that is worse in the morning or after resting.  Pain that goes away after you walk for a few minutes. DIAGNOSIS This condition may be diagnosed based on your signs and symptoms. Your health care provider will also do a physical exam to check for:  A tender area on the bottom of your foot.  A high arch in your foot.  Pain when you move your foot.  Difficulty moving your foot.   TREATMENT  Treatment for plantar fasciitis depends on the severity of the condition. Your treatment may include:  Rest, ice, and over-the-counter pain medicines to manage your pain.  Exercises to stretch your calves and your plantar fascia.  A splint that holds your foot in a stretched, upward position while you sleep (night splint).  Physical therapy to relieve symptoms and prevent problems in the future.  Cortisone injections to relieve severe pain.  Extracorporeal shock wave therapy (ESWT) to stimulate damaged  plantar fascia with electrical impulses. It is often used as a last resort before surgery.  Surgery, if other treatments have not worked after 12 months. HOME CARE INSTRUCTIONS  Take medicines only as directed by your health care provider.  Avoid activities that cause pain.  Roll the bottom of your foot over a bag of ice or a bottle of cold water. Do this for 20 minutes, 3-4 times a day.  Perform simple stretches as directed by your health care provider.  Try wearing athletic shoes with air-sole or gel-sole cushions or soft shoe inserts.  Wear a night splint while sleeping, if directed by your health care provider.  Keep all follow-up appointments with your health care provider. PREVENTION   Do not perform exercises or activities that cause heel pain.  Consider finding low-impact activities if you continue to have problems.  Lose weight if you need to. The best way to prevent plantar fasciitis is to avoid the activities that aggravate your plantar fascia. SEEK MEDICAL CARE IF:  Your symptoms do not go away after treatment with home care measures.  Your pain gets worse.  Your pain affects your ability to move or do your daily activities.   This information is not intended to replace advice given to you by your health care provider. Make sure you discuss any questions you have with your health care provider.   Document Released: 10/03/2000 Document Revised: 09/29/2014 Document Reviewed: 11/18/2013 Elsevier Interactive Patient Education [employer].

## 2015-05-10 NOTE — Progress Notes (Signed)
BP 112/76 mmHg  Pulse 84  Temp(Src) 97.7 F (36.5 C)  Ht 4' 11.25" (1.505 m)  Wt 182 lb 6.4 oz (82.736 kg)  BMI 36.53 kg/m2  SpO2 99%   Subjective:    Patient ID: Pamela Livingston, female    DOB: 1965/07/15, 50 y.o.   MRN: [id number]  HPI: Pamela Livingston is a 50 y.o. female presenting on 05/10/2015 for Medication Refill   HPI  Chief Complaint  Patient presents with  . Medication Refill    -Pt turned in her [facility] discount application- for referral to urology for persistent hematuria  -She had f/u on her abnormal mammogram- diagnostic test was normal  -Pt states her GERD symptoms are  great on omeprazole.  -she is Doing okay with lipitor  -She is still smoking but says she has cut back  -She says her allergies are doing well now that she is back on her singulair  -Pt c/o L lower leg swelling and pain since last week.  She also c/o bilateral heel pain  Relevant past medical, surgical, family and social history reviewed and updated as indicated. Interim medical history since our last visit reviewed. Allergies and medications reviewed and updated.   Current outpatient prescriptions:  .  albuterol (PROVENTIL HFA;VENTOLIN HFA) 108 (90 Base) MCG/ACT inhaler, Inhale 2 puffs into the lungs every 6 (six) hours as needed for wheezing or shortness of breath., Disp: 3 Inhaler, Rfl: 1 .  atorvastatin (LIPITOR) 20 MG tablet, Take 1 tablet (20 mg total) by mouth daily., Disp: 90 tablet, Rfl: 1 .  diphenhydrAMINE (BENADRYL) 25 MG tablet, Take 50 mg by mouth at bedtime. , Disp: , Rfl:  .  mometasone-formoterol (DULERA) 100-5 MCG/ACT AERO, Inhale 2 puffs into the lungs 2 (two) times daily., Disp: 3 Inhaler, Rfl: 1 .  montelukast (SINGULAIR) 10 MG tablet, Take 1 tablet (10 mg total) by mouth at bedtime., Disp: 90 tablet, Rfl: 1 .  omeprazole (PRILOSEC) 40 MG capsule, Take 1 capsule (40 mg total) by mouth daily., Disp: 90 capsule, Rfl: 1   Review of Systems  Constitutional: Negative for fever,  chills, diaphoresis, appetite change, fatigue and unexpected weight change.  HENT: Negative for congestion, dental problem, drooling, ear pain, facial swelling, hearing loss, mouth sores, sneezing, sore throat, trouble swallowing and voice change.   Eyes: Negative for pain, discharge, redness, itching and visual disturbance.  Respiratory: Negative for cough, choking, shortness of breath and wheezing.   Cardiovascular: Positive for leg swelling. Negative for chest pain and palpitations.  Gastrointestinal: Negative for vomiting, abdominal pain, diarrhea, constipation and blood in stool.  Endocrine: Negative for cold intolerance, heat intolerance and polydipsia.  Genitourinary: Negative for dysuria, hematuria and decreased urine volume.  Musculoskeletal: Positive for back pain and gait problem. Negative for arthralgias.  Skin: Negative for rash.  Allergic/Immunologic: Negative for environmental allergies.  Neurological: Negative for seizures, syncope, light-headedness and headaches.  Hematological: Negative for adenopathy.  Psychiatric/Behavioral: Negative for suicidal ideas, dysphoric mood and agitation. The patient is not nervous/anxious.     Per HPI unless specifically indicated above     Objective:    BP 112/76 mmHg  Pulse 84  Temp(Src) 97.7 F (36.5 C)  Ht 4' 11.25" (1.505 m)  Wt 182 lb 6.4 oz (82.736 kg)  BMI 36.53 kg/m2  SpO2 99%  Wt Readings from Last 3 Encounters:  05/10/15 182 lb 6.4 oz (82.736 kg)  04/07/15 187 lb 11.2 oz (85.14 kg)  03/07/15 184 lb 12 oz (83.802 kg)  Physical Exam  Constitutional: She is oriented to person, place, and time. She appears well-developed and well-nourished.  HENT:  Head: Normocephalic and atraumatic.  Neck: Neck supple.  Cardiovascular: Normal rate and regular rhythm.   Pulmonary/Chest: Effort normal and breath sounds normal.  Abdominal: Soft. Bowel sounds are normal. She exhibits no mass. There is no hepatosplenomegaly. There is no  tenderness.  Musculoskeletal: She exhibits edema.       Left lower leg: She exhibits tenderness and swelling.       Right foot: There is tenderness. There is normal range of motion, no swelling and normal capillary refill.       Left foot: There is tenderness. There is normal range of motion, no swelling and normal capillary refill.  Bilateral heel tenderness, plantar aspect  Lymphadenopathy:    She has no cervical adenopathy.  Neurological: She is alert and oriented to person, place, and time.  Skin: Skin is warm and dry.  Psychiatric: She has a normal mood and affect. Her behavior is normal.  Vitals reviewed.  R calf 15.5 inch.  L calf 16 inch      Assessment & Plan:   Encounter Diagnoses  Name Primary?  . Swelling of calf Yes  . Calf pain, left   . Hematuria   . Hyperlipidemia   . Gastroesophageal reflux disease, esophagitis presence not specified   . Cigarette nicotine dependence with nicotine-induced disorder   . Plantar fasciitis     -Send for doppler US to r/o DVT -Get referral in for urologist -counseled pt on Smoking cessation -Counseled on plantar fasciitis and gave reading information. Pt to use ice, avoid walking barefoot, recommended sneakers +/- inserts, foot/leg exercises -F/u 1 month.  RTO sooner prn

## 2015-05-17 NOTE — Congregational Nurse Program (Signed)
Congregational Nurse Program Note  Date of Encounter: 03/03/2015  Past Medical History: Past Medical History  Diagnosis Date  . COPD (chronic obstructive pulmonary disease) ([place])   . Endometriosis   . Chronic pelvic pain in female   . Asthma   . Syncope   . Suicide attempt ([place]) 2008    by phenergan overdose  . Polysubstance abuse     marijuana, cocaine, benzos  . Stomach ulcer     Encounter Details:   New client to [facility] program. Recent ER visit to [facility] on 03/02/15 with complaints of chest pain radiating to left axilla and down left arm with associated tingling in left arm for 1 week. Denies nausea or vomiting. No shortness of breath. Currently rates pain a 5/10 and states nothing relieves it. States ER cleared her for cardiac and gave her a prescription for flexeril. B/P today 110/70 R 16.PMH: endometriosis, asthma, stomach ulcer, drug abuse of cocaine , clean x 7 years per client. . Allergy to ASA, Hives, anaphylaxis.  Will refer client to the [facility] [place], appointment made for 03/07/15 at 09:15am. Will follow up as needed.

## 2015-05-25 ENCOUNTER — Ambulatory Visit: Payer: Self-pay | Admitting: Physician Assistant

## 2015-05-30 ENCOUNTER — Encounter: Payer: Self-pay | Admitting: Physician Assistant

## 2015-06-09 ENCOUNTER — Ambulatory Visit: Payer: Self-pay | Admitting: Physician Assistant

## 2015-06-09 ENCOUNTER — Encounter: Payer: Self-pay | Admitting: Physician Assistant

## 2015-06-09 VITALS — BP 102/72 | HR 97 | Temp 97.9°F | Ht 59.25 in | Wt 182.6 lb

## 2015-06-09 DIAGNOSIS — F17219 Nicotine dependence, cigarettes, with unspecified nicotine-induced disorders: Secondary | ICD-10-CM

## 2015-06-09 DIAGNOSIS — K219 Gastro-esophageal reflux disease without esophagitis: Secondary | ICD-10-CM

## 2015-06-09 DIAGNOSIS — R319 Hematuria, unspecified: Secondary | ICD-10-CM

## 2015-06-09 DIAGNOSIS — E785 Hyperlipidemia, unspecified: Secondary | ICD-10-CM

## 2015-06-09 DIAGNOSIS — J441 Chronic obstructive pulmonary disease with (acute) exacerbation: Secondary | ICD-10-CM

## 2015-06-09 MED ORDER — PREDNISONE 10 MG PO TABS: ORAL_TABLET | ORAL | Status: AC

## 2015-06-09 NOTE — Progress Notes (Signed)
BP 102/72 mmHg  Pulse 97  Temp(Src) 97.9 F (36.6 C)  Ht 4' 11.25" (1.505 m)  Wt 182 lb 9.6 oz (82.827 kg)  BMI 36.57 kg/m2  SpO2 99%   Subjective:    Patient ID: Pamela Livingston, female    DOB: 1965-09-03, 50 y.o.   MRN: [id number]  HPI: Pamela Livingston is a 50 y.o. female presenting on 06/09/2015 for Follow-up and Shortness of Breath   HPI   Chief Complaint  Patient presents with  . Follow-up  . Shortness of Breath    pt states she has a lot of congestion     Pt still having LE swelling, L>>R.  States feet pain improved some  Pt states smoking down from 1 ppd to 1/2 ppd.  Says he breathing is still "terrible"  Pt has appt with urology may 31 for hematuria   Relevant past medical, surgical, family and social history reviewed and updated as indicated. Interim medical history since our last visit reviewed. Allergies and medications reviewed and updated.   Current outpatient prescriptions:  .  albuterol (PROVENTIL HFA;VENTOLIN HFA) 108 (90 Base) MCG/ACT inhaler, Inhale 2 puffs into the lungs every 6 (six) hours as needed for wheezing or shortness of breath., Disp: 3 Inhaler, Rfl: 1 .  atorvastatin (LIPITOR) 20 MG tablet, Take 1 tablet (20 mg total) by mouth daily., Disp: 90 tablet, Rfl: 1 .  diphenhydrAMINE (BENADRYL) 25 MG tablet, Take 50 mg by mouth at bedtime. , Disp: , Rfl:  .  mometasone-formoterol (DULERA) 100-5 MCG/ACT AERO, Inhale 2 puffs into the lungs 2 (two) times daily., Disp: 3 Inhaler, Rfl: 1 .  montelukast (SINGULAIR) 10 MG tablet, Take 1 tablet (10 mg total) by mouth at bedtime., Disp: 90 tablet, Rfl: 1 .  omeprazole (PRILOSEC) 40 MG capsule, Take 1 capsule (40 mg total) by mouth daily., Disp: 90 capsule, Rfl: 1   Review of Systems  Constitutional: Negative for fever, chills, diaphoresis, appetite change, fatigue and unexpected weight change.  HENT: Positive for congestion. Negative for dental problem, drooling, ear pain, facial swelling, hearing loss, mouth sores,  sneezing, sore throat, trouble swallowing and voice change.   Eyes: Negative for pain, discharge, redness, itching and visual disturbance.  Respiratory: Positive for cough, shortness of breath and wheezing. Negative for choking.   Cardiovascular: Negative for chest pain, palpitations and leg swelling.  Gastrointestinal: Negative for vomiting, abdominal pain, diarrhea, constipation and blood in stool.  Endocrine: Negative for cold intolerance, heat intolerance and polydipsia.  Genitourinary: Negative for dysuria, hematuria and decreased urine volume.  Musculoskeletal: Negative for back pain, arthralgias and gait problem.  Skin: Negative for rash.  Allergic/Immunologic: Negative for environmental allergies.  Neurological: Negative for seizures, syncope, light-headedness and headaches.  Hematological: Negative for adenopathy.  Psychiatric/Behavioral: Negative for suicidal ideas, dysphoric mood and agitation. The patient is not nervous/anxious.     Per HPI unless specifically indicated above     Objective:    BP 102/72 mmHg  Pulse 97  Temp(Src) 97.9 F (36.6 C)  Ht 4' 11.25" (1.505 m)  Wt 182 lb 9.6 oz (82.827 kg)  BMI 36.57 kg/m2  SpO2 99%  Wt Readings from Last 3 Encounters:  06/09/15 182 lb 9.6 oz (82.827 kg)  05/10/15 182 lb 6.4 oz (82.736 kg)  04/07/15 187 lb 11.2 oz (85.14 kg)    Physical Exam  Constitutional: She is oriented to person, place, and time. She appears well-developed and well-nourished.  HENT:  Head: Normocephalic and atraumatic.  Right Ear: Hearing,  tympanic membrane, external ear and ear canal normal.  Left Ear: Hearing, tympanic membrane, external ear and ear canal normal.  Nose: Nose normal.  Mouth/Throat: Uvula is midline and oropharynx is clear and moist. No oropharyngeal exudate.  Neck: Neck supple.  Cardiovascular: Normal rate and regular rhythm.   Pulmonary/Chest: Effort normal and breath sounds normal. She has no wheezes.  Abdominal: Soft. Bowel  sounds are normal. She exhibits no mass. There is no hepatosplenomegaly. There is no tenderness.  Musculoskeletal: She exhibits no edema.  Pt wearing flip-flops  Lymphadenopathy:    She has no cervical adenopathy.  Neurological: She is alert and oriented to person, place, and time.  Skin: Skin is warm and dry.  Psychiatric: She has a normal mood and affect. Her behavior is normal.  Vitals reviewed.       Assessment & Plan:   Encounter Diagnoses  Name Primary?  Marland Kitchen COPD exacerbation ([place]) Yes  . Cigarette nicotine dependence with nicotine-induced disorder   . Hyperlipidemia   . Hematuria   . Gastroesophageal reflux disease, esophagitis presence not specified     -Counseled smoking cessation -Urology appointment as scheduled -Encouraged regular exercise like walking to maintain LE muscles and help with swelling.  Encouraged good supportive shoes like sneakers -continue current medications -f/u 3 months.  RTO sooner prn

## 2015-06-22 ENCOUNTER — Ambulatory Visit (INDEPENDENT_AMBULATORY_CARE_PROVIDER_SITE_OTHER): Payer: PRIVATE HEALTH INSURANCE | Admitting: Urology

## 2015-06-22 DIAGNOSIS — R3129 Other microscopic hematuria: Secondary | ICD-10-CM

## 2015-06-24 ENCOUNTER — Other Ambulatory Visit: Payer: Self-pay | Admitting: Urology

## 2015-06-24 DIAGNOSIS — R3129 Other microscopic hematuria: Secondary | ICD-10-CM

## 2015-06-30 ENCOUNTER — Ambulatory Visit (HOSPITAL_COMMUNITY)
Admission: RE | Admit: 2015-06-30 | Discharge: 2015-06-30 | Disposition: A | Discharge status: Home / Self Care | Payer: PRIVATE HEALTH INSURANCE | Source: Ambulatory Visit | Attending: Urology | Admitting: Urology

## 2015-06-30 DIAGNOSIS — R3129 Other microscopic hematuria: Secondary | ICD-10-CM | POA: Insufficient documentation

## 2015-06-30 MED ORDER — IOPAMIDOL (ISOVUE-300) INJECTION 61%
INTRAVENOUS | Status: AC
  Administered 2015-06-30: 125 mL
  Filled 2015-06-30: qty 300

## 2015-08-03 ENCOUNTER — Ambulatory Visit (INDEPENDENT_AMBULATORY_CARE_PROVIDER_SITE_OTHER): Payer: PRIVATE HEALTH INSURANCE | Admitting: Urology

## 2015-08-03 DIAGNOSIS — R3129 Other microscopic hematuria: Secondary | ICD-10-CM

## 2015-08-06 ENCOUNTER — Emergency Department (HOSPITAL_COMMUNITY)
Admission: EM | Admit: 2015-08-06 | Discharge: 2015-08-07 | Disposition: A | Discharge status: Home / Self Care | Payer: Self-pay | Attending: Emergency Medicine | Admitting: Emergency Medicine

## 2015-08-06 ENCOUNTER — Encounter (HOSPITAL_COMMUNITY): Payer: Self-pay

## 2015-08-06 ENCOUNTER — Emergency Department (HOSPITAL_COMMUNITY): Payer: Self-pay

## 2015-08-06 DIAGNOSIS — S82831A Other fracture of upper and lower end of right fibula, initial encounter for closed fracture: Secondary | ICD-10-CM | POA: Insufficient documentation

## 2015-08-06 DIAGNOSIS — Y999 Unspecified external cause status: Secondary | ICD-10-CM | POA: Insufficient documentation

## 2015-08-06 DIAGNOSIS — J45909 Unspecified asthma, uncomplicated: Secondary | ICD-10-CM | POA: Insufficient documentation

## 2015-08-06 DIAGNOSIS — J449 Chronic obstructive pulmonary disease, unspecified: Secondary | ICD-10-CM | POA: Insufficient documentation

## 2015-08-06 DIAGNOSIS — Y9301 Activity, walking, marching and hiking: Secondary | ICD-10-CM | POA: Insufficient documentation

## 2015-08-06 DIAGNOSIS — Z79899 Other long term (current) drug therapy: Secondary | ICD-10-CM | POA: Insufficient documentation

## 2015-08-06 DIAGNOSIS — M25561 Pain in right knee: Secondary | ICD-10-CM

## 2015-08-06 DIAGNOSIS — X58XXXA Exposure to other specified factors, initial encounter: Secondary | ICD-10-CM | POA: Insufficient documentation

## 2015-08-06 DIAGNOSIS — Y929 Unspecified place or not applicable: Secondary | ICD-10-CM | POA: Insufficient documentation

## 2015-08-06 DIAGNOSIS — S82401A Unspecified fracture of shaft of right fibula, initial encounter for closed fracture: Secondary | ICD-10-CM

## 2015-08-06 DIAGNOSIS — F17210 Nicotine dependence, cigarettes, uncomplicated: Secondary | ICD-10-CM | POA: Insufficient documentation

## 2015-08-06 MED ORDER — HYDROCODONE-ACETAMINOPHEN 5-325 MG PO TABS
1.0000 | ORAL_TABLET | Freq: Once | ORAL | Status: AC
  Administered 2015-08-06: 1 via ORAL
  Filled 2015-08-06: qty 1

## 2015-08-06 NOTE — ED Notes (Signed)
I went down the water slide today and my right leg went up under me and it is hurting me really bad.  Hurts to walk.

## 2015-08-06 NOTE — ED Provider Notes (Signed)
CSN: [id number]     Arrival date & time 08/06/15  2151 History   First MD Initiated Contact with Patient 08/06/15 2207     Chief Complaint  Patient presents with  . Knee Injury     (Consider location/radiation/quality/duration/timing/severity/associated sxs/prior Treatment) HPI  Pamela Livingston is a 50 y.o. female who presents to the Emergency Department complaining of right knee pain for several hours.  She complains of injury to the knee that occurred from a hyperflexion of the knee.  She reports pain and swelling to the knee.  Pain worse with weight bearing and attempted bending.  She has applied ice.  She denies other injuries, numbness to the extremity or pain to the lateral knee.  Past Medical History  Diagnosis Date  . COPD (chronic obstructive pulmonary disease) ([place])   . Endometriosis   . Chronic pelvic pain in female   . Asthma   . Syncope   . Suicide attempt ([place]) 2008    by phenergan overdose  . Polysubstance abuse     marijuana, cocaine, benzos  . Stomach ulcer    Past Surgical History  Procedure Laterality Date  . Abdominal hysterectomy    . Cholecystectomy    . Cesarean section  1993  . Radical hysterectomy with transposition of ovaries    . Cervical cone biopsy    . Tubal ligation  1993   Family History  Problem Relation Age of Onset  . Heart disease Mother   . Hypertension Mother   . Diabetes Mother   . Hypertension Father   . Heart attack Maternal Uncle   . COPD Paternal Grandfather   . Heart attack Maternal Uncle    Social History  Substance Use Topics  . Smoking status: Current Every Day Smoker -- 0.50 packs/day for 27 years    Types: Cigarettes  . Smokeless tobacco: Never Used  . Alcohol Use: Yes     Comment: occassional   OB History    No data available     Review of Systems  Constitutional: Negative for fever and chills.  Musculoskeletal: Positive for joint swelling and arthralgias (right knee pain).  Skin: Negative for color change and  wound.  All other systems reviewed and are negative.     Allergies  Asa  Home Medications   Prior to Admission medications   Medication Sig Start Date End Date Taking? Authorizing Provider  albuterol (PROVENTIL HFA;VENTOLIN HFA) 108 (90 Base) MCG/ACT inhaler Inhale 2 puffs into the lungs every 6 (six) hours as needed for wheezing or shortness of breath. 04/07/15   Soyla Dryer, PA-C  atorvastatin (LIPITOR) 20 MG tablet Take 1 tablet (20 mg total) by mouth daily. 04/07/15   Soyla Dryer, PA-C  diphenhydrAMINE (BENADRYL) 25 MG tablet Take 50 mg by mouth at bedtime.     Historical Provider, MD  mometasone-formoterol (DULERA) 100-5 MCG/ACT AERO Inhale 2 puffs into the lungs 2 (two) times daily. 04/07/15   Soyla Dryer, PA-C  montelukast (SINGULAIR) 10 MG tablet Take 1 tablet (10 mg total) by mouth at bedtime. 04/07/15   Soyla Dryer, PA-C  omeprazole (PRILOSEC) 40 MG capsule Take 1 capsule (40 mg total) by mouth daily. 03/07/15   Soyla Dryer, PA-C   BP 111/68 mmHg  Pulse 95  Temp(Src) 97.7 F (36.5 C) (Oral)  Resp 18  Ht 4\' 11"  (1.499 m)  Wt 77.111 kg  BMI 34.32 kg/m2  SpO2 98% Physical Exam  Constitutional: She is oriented to person, place, and time. She appears well-developed  and well-nourished. No distress.  Cardiovascular: Normal rate, regular rhythm and intact distal pulses.   Pulmonary/Chest: Effort normal and breath sounds normal.  Musculoskeletal: She exhibits tenderness.  ttp of the medial right knee.  No erythema, effusion, or step-off deformity.  DP pulse brisk, distal sensation intact. Calf is soft and NT.  Neurological: She is alert and oriented to person, place, and time. She exhibits normal muscle tone. Coordination normal.  Skin: Skin is warm and dry. No erythema.  Nursing note and vitals reviewed.   ED Course  Procedures (including critical care time) Labs Review Labs Reviewed - No data to display  Imaging Review Dg Knee Complete 4 Views  Right  08/06/2015  CLINICAL DATA:  Injury from water slide. Pain to the right knee. Patient unable to move leg. EXAM: RIGHT KNEE - COMPLETE 4+ VIEW COMPARISON:  None. FINDINGS: There is a small avulsion fracture off of the superior tip of right fibula with overlying soft tissue swelling. The right knee joint appears intact. No significant effusion. No focal bone lesion or bone destruction. IMPRESSION: Avulsion fracture off of the superior tip of the right fibula with overlying soft tissue swelling. Electronically Signed   By: Lucienne Capers M.D.   On: 08/06/2015 22:41   I have personally reviewed and evaluated these images and lab results as part of my medical decision-making.   EKG Interpretation None      MDM   Final diagnoses:  Right knee pain  Fibula fracture, right, closed, initial encounter   XR discussed with patient.  NV intact,.  Fx noted to the fibula, but pain localized medially.    Agrees to RICE therapy.  Knee immobilizer applied and crutches given.  Prefers to f/u locally with orthopedics.         Kem Parkinson, PA-C 08/08/15 [place], MD 08/09/15 [phone]

## 2015-08-06 NOTE — ED Notes (Signed)
Pt states her father is in waiting room and will drive pt home at d/c

## 2015-08-07 MED ORDER — HYDROCODONE-ACETAMINOPHEN 5-325 MG PO TABS: ORAL_TABLET | ORAL | Status: DC

## 2015-08-07 NOTE — Discharge Instructions (Signed)
Knee Pain  Knee pain is a common problem. It can have many causes. The pain often goes away by following your doctor's home care instructions. Treatment for ongoing pain will depend on the cause of your pain. If your knee pain continues, more tests may be needed to diagnose your condition. Tests may include X-rays or other imaging studies of your knee.  HOME CARE   Take medicines only as told by your doctor.   Rest your knee and keep it raised (elevated) while you are resting.   Do not do things that cause pain or make your pain worse.   Avoid activities where both feet leave the ground at the same time, such as running, jumping rope, or doing jumping jacks.   Apply ice to the knee area:    Put ice in a plastic bag.    Place a towel between your skin and the bag.    Leave the ice on for 20 minutes, 2-3 times a day.   Ask your doctor if you should wear an elastic knee support.   Sleep with a pillow under your knee.   Lose weight if you are overweight. Being overweight can make your knee hurt more.   Do not use any tobacco products, including cigarettes, chewing tobacco, or electronic cigarettes. If you need help quitting, ask your doctor. Smoking may slow the healing of any bone and joint problems that you may have.  GET HELP IF:   Your knee pain does not stop, it changes, or it gets worse.   You have a fever along with knee pain.   Your knee gives out or locks up.   Your knee becomes more swollen.  GET HELP RIGHT AWAY IF:    Your knee feels hot to the touch.   You have chest pain or trouble breathing.     This information is not intended to replace advice given to you by your health care provider. Make sure you discuss any questions you have with your health care provider.     Document Released: 04/06/2008 Document Revised: 01/29/2014 Document Reviewed: 03/11/2013  Elsevier Interactive Patient Education 2016 [employer] Inc[employer]

## 2015-08-09 ENCOUNTER — Encounter: Payer: Self-pay | Admitting: Orthopaedic Surgery

## 2015-08-09 ENCOUNTER — Ambulatory Visit (INDEPENDENT_AMBULATORY_CARE_PROVIDER_SITE_OTHER): Payer: PRIVATE HEALTH INSURANCE | Admitting: Orthopaedic Surgery

## 2015-08-09 VITALS — BP 109/79 | HR 81 | Temp 98.2°F | Ht 60.0 in | Wt 174.8 lb

## 2015-08-09 DIAGNOSIS — S82831A Other fracture of upper and lower end of right fibula, initial encounter for closed fracture: Secondary | ICD-10-CM | POA: Diagnosis not present

## 2015-08-09 DIAGNOSIS — Z720 Tobacco use: Secondary | ICD-10-CM

## 2015-08-09 DIAGNOSIS — F17200 Nicotine dependence, unspecified, uncomplicated: Secondary | ICD-10-CM

## 2015-08-09 DIAGNOSIS — J449 Chronic obstructive pulmonary disease, unspecified: Secondary | ICD-10-CM | POA: Diagnosis not present

## 2015-08-09 MED ORDER — TRAMADOL HCL 50 MG PO TABS: 50.0000 mg | ORAL_TABLET | Freq: Four times a day (QID) | ORAL | Status: DC | PRN

## 2015-08-09 NOTE — Progress Notes (Signed)
Subjective: I hurt my right knee    Patient ID: Pamela Livingston, female    DOB: 04/26/1965, 50 y.o.   MRN: [id number]  HPI She fell at home on 08-06-15 and hurt her right knee.  She went to the ER and x-rays show a fracture nondisplaced of proximal fibula head.  She has swelling and pain of the right knee.  She has no other injury. She was given a knee immobilizer and pain medicine.  She is doing well.  I have reviewed the x-rays, the x-ray report and the ER records.  I have told her of the possibility of a meniscus or ligamentous injury but her bone needs to heal first.  She will need to use the knee immobilizer.  She smokes and I have talked to her about cutting back. She does not want to stop.  I have told her it will slow down healing. She also has COPD and the smoking does not help.  She has problems with breathing in very hot weather.   Review of Systems  HENT: Negative for congestion.   Respiratory: Positive for cough and shortness of breath.   Cardiovascular: Negative for chest pain and leg swelling.  Endocrine: Positive for cold intolerance.  Musculoskeletal: Positive for joint swelling, arthralgias and gait problem.  Allergic/Immunologic: Positive for environmental allergies.   Past Medical History  Diagnosis Date  . COPD (chronic obstructive pulmonary disease) ([place])   . Endometriosis   . Chronic pelvic pain in female   . Asthma   . Syncope   . Suicide attempt ([place]) 2008    by phenergan overdose  . Polysubstance abuse     marijuana, cocaine, benzos  . Stomach ulcer     Past Surgical History  Procedure Laterality Date  . Abdominal hysterectomy    . Cholecystectomy    . Cesarean section  1993  . Radical hysterectomy with transposition of ovaries    . Cervical cone biopsy    . Tubal ligation  1993    Current Outpatient Prescriptions on File Prior to Visit  Medication Sig Dispense Refill  . albuterol (PROVENTIL HFA;VENTOLIN HFA) 108 (90 Base) MCG/ACT inhaler Inhale 2  puffs into the lungs every 6 (six) hours as needed for wheezing or shortness of breath. 3 Inhaler 1  . atorvastatin (LIPITOR) 20 MG tablet Take 1 tablet (20 mg total) by mouth daily. 90 tablet 1  . diphenhydrAMINE (BENADRYL) 25 MG tablet Take 50 mg by mouth at bedtime.     Marland Kitchen HYDROcodone-acetaminophen (NORCO/VICODIN) 5-325 MG tablet Take one-two tabs po q 4-6 hrs prn pain 15 tablet 0  . mometasone-formoterol (DULERA) 100-5 MCG/ACT AERO Inhale 2 puffs into the lungs 2 (two) times daily. 3 Inhaler 1  . montelukast (SINGULAIR) 10 MG tablet Take 1 tablet (10 mg total) by mouth at bedtime. 90 tablet 1  . omeprazole (PRILOSEC) 40 MG capsule Take 1 capsule (40 mg total) by mouth daily. 90 capsule 1   No current facility-administered medications on file prior to visit.    Social History   Social History  . Marital Status: Legally Separated    Spouse Name: N/A  . Number of Children: N/A  . Years of Education: N/A   Occupational History  . Not on file.   Social History Main Topics  . Smoking status: Current Every Day Smoker -- 0.50 packs/day for 27 years    Types: Cigarettes  . Smokeless tobacco: Never Used  . Alcohol Use: Yes     Comment:  occassional  . Drug Use: No     Comment: past use QUIT DATE 04-2008 (UDS positive 02/2015)  . Sexual Activity: Yes    Birth Control/ Protection: Surgical   Other Topics Concern  . Not on file   Social History Narrative    Family History  Problem Relation Age of Onset  . Heart disease Mother   . Hypertension Mother   . Diabetes Mother   . Hypertension Father   . Heart attack Maternal Uncle   . COPD Paternal Grandfather   . Heart attack Maternal Uncle     BP 109/79 mmHg  Pulse 81  Temp(Src) 98.2 F (36.8 C)  Ht 5' (1.524 m)  Wt 174 lb 12.8 oz (79.289 kg)  BMI 34.14 kg/m2     Objective:   Physical Exam  Constitutional: She is oriented to person, place, and time. She appears well-developed and well-nourished.  HENT:  Head:  Normocephalic and atraumatic.  Eyes: Conjunctivae and EOM are normal. Pupils are equal, round, and reactive to light.  Neck: Normal range of motion. Neck supple.  Cardiovascular: Normal rate, regular rhythm and intact distal pulses.   Pulmonary/Chest: Effort normal.  Abdominal: Soft.  Musculoskeletal: She exhibits tenderness (Pain and swelling of the right knee with more pain laterally.  NV initact. ROM 0 to 100 with pain.  Left knee negative.  ).  Neurological: She is alert and oriented to person, place, and time. She displays normal reflexes. No cranial nerve deficit. She exhibits normal muscle tone. Coordination normal.  Skin: Skin is warm and dry.  Psychiatric: She has a normal mood and affect. Her behavior is normal. Judgment and thought content normal.          Assessment & Plan:   Encounter Diagnoses  Name Primary?  . Closed fracture of proximal end of right fibula Yes  . Tobacco smoker within last 12 months   . Chronic obstructive pulmonary disease, unspecified COPD type ([place])    She will consider cutting back on smoking.  (not likely)  She is to use the knee immobilizer and rest.  Return in two weeks with x-rays at that time.  Call if any problem.  Precautions discussed.  Electronically Signed Sanjuana Kava, MD 7/18/201711:18 AM

## 2015-08-09 NOTE — Patient Instructions (Signed)
Continue immobilizer.

## 2015-08-23 ENCOUNTER — Ambulatory Visit (HOSPITAL_COMMUNITY)
Admission: RE | Admit: 2015-08-23 | Discharge: 2015-08-23 | Disposition: A | Discharge status: Home / Self Care | Payer: PRIVATE HEALTH INSURANCE | Source: Ambulatory Visit | Attending: Orthopaedic Surgery | Admitting: Orthopaedic Surgery

## 2015-08-23 ENCOUNTER — Ambulatory Visit: Payer: PRIVATE HEALTH INSURANCE | Admitting: Orthopaedic Surgery

## 2015-08-23 ENCOUNTER — Encounter: Payer: Self-pay | Admitting: Orthopaedic Surgery

## 2015-08-23 VITALS — BP 134/90 | HR 77 | Temp 97.9°F | Ht 59.75 in | Wt 177.2 lb

## 2015-08-23 DIAGNOSIS — X58XXXD Exposure to other specified factors, subsequent encounter: Secondary | ICD-10-CM | POA: Insufficient documentation

## 2015-08-23 DIAGNOSIS — S82831D Other fracture of upper and lower end of right fibula, subsequent encounter for closed fracture with routine healing: Secondary | ICD-10-CM | POA: Insufficient documentation

## 2015-08-23 NOTE — Progress Notes (Signed)
CC:  I feel better  The right knee is not tender over the proximal fibula.  She has some medial joint pain.  She has been using the knee immobilizer.  NV is intact. ROM is 0 to 110.  She is tender over the medial joint line and not the proximal fibula area.  I have told her she can stop the knee immobilizer and go to a crutch and use it on the left side.  Encounter Diagnosis  Name Primary?  . Fracture of right proximal fibula, closed, with routine healing, subsequent encounter Yes   Return in three weeks.  X-rays on return.  Call if any problem.  Precautions discussed.  Electronically Signed Sanjuana Kava, MD 8/1/20178:54 AM

## 2015-09-05 ENCOUNTER — Other Ambulatory Visit: Payer: Self-pay

## 2015-09-05 DIAGNOSIS — E785 Hyperlipidemia, unspecified: Secondary | ICD-10-CM

## 2015-09-07 ENCOUNTER — Ambulatory Visit: Payer: Self-pay | Admitting: Physician Assistant

## 2015-09-13 ENCOUNTER — Encounter: Payer: Self-pay | Admitting: Orthopaedic Surgery

## 2015-09-13 ENCOUNTER — Ambulatory Visit: Payer: PRIVATE HEALTH INSURANCE | Admitting: Orthopaedic Surgery

## 2015-09-15 ENCOUNTER — Ambulatory Visit: Payer: Self-pay | Admitting: Physician Assistant

## 2015-09-19 ENCOUNTER — Encounter: Payer: Self-pay | Admitting: Physician Assistant

## 2015-10-20 ENCOUNTER — Ambulatory Visit: Payer: Self-pay | Admitting: Physician Assistant

## 2015-10-20 ENCOUNTER — Encounter: Payer: Self-pay | Admitting: Physician Assistant

## 2015-10-20 VITALS — BP 120/80 | HR 91 | Temp 98.2°F | Ht 59.75 in | Wt 176.6 lb

## 2015-10-20 DIAGNOSIS — F17219 Nicotine dependence, cigarettes, with unspecified nicotine-induced disorders: Secondary | ICD-10-CM

## 2015-10-20 DIAGNOSIS — R062 Wheezing: Secondary | ICD-10-CM

## 2015-10-20 DIAGNOSIS — J441 Chronic obstructive pulmonary disease with (acute) exacerbation: Secondary | ICD-10-CM

## 2015-10-20 DIAGNOSIS — R0602 Shortness of breath: Secondary | ICD-10-CM

## 2015-10-20 MED ORDER — BENZONATATE 100 MG PO CAPS: ORAL_CAPSULE | ORAL | 3 refills | Status: DC

## 2015-10-20 MED ORDER — PREDNISONE 10 MG PO TABS: ORAL_TABLET | ORAL | 0 refills | Status: DC

## 2015-10-20 MED ORDER — ALBUTEROL SULFATE (2.5 MG/3ML) 0.083% IN NEBU
2.5000 mg | INHALATION_SOLUTION | Freq: Once | RESPIRATORY_TRACT | Status: AC
  Administered 2015-10-20: 2.5 mg via RESPIRATORY_TRACT

## 2015-10-20 MED ORDER — AZITHROMYCIN 250 MG PO TABS: ORAL_TABLET | ORAL | 0 refills | Status: DC

## 2015-10-20 MED ORDER — ALBUTEROL SULFATE HFA 108 (90 BASE) MCG/ACT IN AERS: 2.0000 | INHALATION_SPRAY | Freq: Four times a day (QID) | RESPIRATORY_TRACT | 1 refills | Status: DC | PRN

## 2015-10-20 MED ORDER — ALBUTEROL SULFATE (2.5 MG/3ML) 0.083% IN NEBU: 2.5000 mg | INHALATION_SOLUTION | Freq: Four times a day (QID) | RESPIRATORY_TRACT | 1 refills | Status: DC | PRN

## 2015-10-20 NOTE — Progress Notes (Signed)
   BP 120/80 (BP Location: Left Arm, Patient Position: Sitting, Cuff Size: Normal)   Pulse 91   Temp 98.2 F (36.8 C)   Ht 4' 11.75" (1.518 m)   Wt 176 lb 9.6 oz (80.1 kg)   SpO2 98%   BMI 34.78 kg/m    Subjective:    Patient ID: Pamela Livingston, female    DOB: Nov 18, 1965, 50 y.o.   MRN: [id number]  HPI: Pamela Livingston is a 50 y.o. female presenting on 10/20/2015 for Nasal Congestion (for about 3 weeks. pain under eyes, pt states she has been coughing up green stuff, aches all over, pt c/o sob, difficulty breathing, wheezing. )   HPI   Chief Complaint  Patient presents with  . Nasal Congestion    for about 3 weeks. pain under eyes, pt states she has been coughing up green stuff, aches all over, pt c/o sob, difficulty breathing, wheezing.    She is a smoker but says she hasn't for 2 week  Relevant past medical, surgical, family and social history reviewed and updated as indicated. Interim medical history since our last visit reviewed. Allergies and medications reviewed and updated.  Review of Systems  Constitutional: Negative for fever.  HENT: Negative for ear pain.   Respiratory: Positive for cough, chest tightness, shortness of breath and wheezing.   Gastrointestinal: Negative for abdominal pain.    Per HPI unless specifically indicated above     Objective:    BP 120/80 (BP Location: Left Arm, Patient Position: Sitting, Cuff Size: Normal)   Pulse 91   Temp 98.2 F (36.8 C)   Ht 4' 11.75" (1.518 m)   Wt 176 lb 9.6 oz (80.1 kg)   SpO2 98%   BMI 34.78 kg/m   Wt Readings from Last 3 Encounters:  10/20/15 176 lb 9.6 oz (80.1 kg)  08/23/15 177 lb 3.2 oz (80.4 kg)  08/09/15 174 lb 12.8 oz (79.3 kg)    Physical Exam  Constitutional: She is oriented to person, place, and time. She appears well-developed and well-nourished.  HENT:  Head: Normocephalic and atraumatic.  Right Ear: Hearing, tympanic membrane, external ear and ear canal normal.  Left Ear: Hearing, tympanic  membrane, external ear and ear canal normal.  Nose: Nose normal.  Mouth/Throat: Uvula is midline and oropharynx is clear and moist. No oropharyngeal exudate.  Neck: Neck supple.  Cardiovascular: Normal rate and regular rhythm.   Pulmonary/Chest: Effort normal. No tachypnea and no bradypnea. No respiratory distress. She has no decreased breath sounds. She has wheezes. She has no rhonchi. She has no rales.  RR 20  Lymphadenopathy:    She has no cervical adenopathy.  Neurological: She is alert and oriented to person, place, and time.  Skin: Skin is warm and dry.  Psychiatric: She has a normal mood and affect. Her behavior is normal.  Vitals reviewed.       Assessment & Plan:   Encounter Diagnoses  Name Primary?  Marland Kitchen COPD exacerbation (HCC) Yes  . SOB (shortness of breath)   . Wheezing   . Cigarette nicotine dependence with nicotine-induced disorder     -pt given nebulizer treatment x 2 in office today which increased air flow and pt comfort  -rx prednisone taper, zpack, albuterol mdi, albuterol for nebulizer, tessalon for cough. -counseled her to avoid smoking -she need to reschedule her routine appointment.  RTO sooner prn worsening or if wheezes persist

## 2015-10-24 ENCOUNTER — Ambulatory Visit: Payer: Self-pay

## 2015-11-01 ENCOUNTER — Ambulatory Visit: Payer: Self-pay | Admitting: Physician Assistant

## 2015-11-02 ENCOUNTER — Encounter: Payer: Self-pay | Admitting: Physician Assistant

## 2015-11-02 ENCOUNTER — Ambulatory Visit: Payer: Self-pay | Admitting: Physician Assistant

## 2015-11-02 VITALS — BP 124/86 | HR 80 | Temp 98.6°F | Ht 59.75 in | Wt 177.6 lb

## 2015-11-02 DIAGNOSIS — K219 Gastro-esophageal reflux disease without esophagitis: Secondary | ICD-10-CM

## 2015-11-02 DIAGNOSIS — E785 Hyperlipidemia, unspecified: Secondary | ICD-10-CM

## 2015-11-02 DIAGNOSIS — J449 Chronic obstructive pulmonary disease, unspecified: Secondary | ICD-10-CM

## 2015-11-02 DIAGNOSIS — F17219 Nicotine dependence, cigarettes, with unspecified nicotine-induced disorders: Secondary | ICD-10-CM

## 2015-11-02 MED ORDER — MONTELUKAST SODIUM 10 MG PO TABS: 10.0000 mg | ORAL_TABLET | Freq: Every day | ORAL | 1 refills | Status: DC

## 2015-11-02 NOTE — Progress Notes (Signed)
BP 124/86 (BP Location: Left Arm, Patient Position: Sitting, Cuff Size: Normal)   Pulse 80   Temp 98.6 F (37 C)   Ht 4' 11.75" (1.518 m)   Wt 177 lb 9.6 oz (80.6 kg)   SpO2 99%   BMI 34.98 kg/m    Subjective:    Patient ID: Pamela Livingston, female    DOB: 1966-01-04, 50 y.o.   MRN: [id number]  HPI: Pamela Livingston is a 50 y.o. female presenting on 11/02/2015 for Hyperlipidemia; COPD; Chest Pain; and Breathing Problem (pt states she is having difficuly breathing. ear pain, sore throat)   HPI   Pt says she went to urologist for hematuria and they looked but couldn't find anything.  Pt is still smoking  Pt out of many of her meds for about a month or more  Pt did not get her blood drawn for labs ordered in august.  Relevant past medical, surgical, family and social history reviewed and updated as indicated. Interim medical history since our last visit reviewed. Allergies and medications reviewed and updated.   Current Outpatient Prescriptions:  .  albuterol (PROVENTIL HFA;VENTOLIN HFA) 108 (90 Base) MCG/ACT inhaler, Inhale 2 puffs into the lungs every 6 (six) hours as needed for wheezing or shortness of breath., Disp: 1 Inhaler, Rfl: 1 .  albuterol (PROVENTIL) (2.5 MG/3ML) 0.083% nebulizer solution, Take 3 mLs (2.5 mg total) by nebulization every 6 (six) hours as needed for wheezing or shortness of breath., Disp: 25 vial, Rfl: 1 .  diphenhydrAMINE (BENADRYL) 25 MG tablet, Take 50 mg by mouth at bedtime. , Disp: , Rfl:  .  mometasone-formoterol (DULERA) 100-5 MCG/ACT AERO, Inhale 2 puffs into the lungs 2 (two) times daily., Disp: 3 Inhaler, Rfl: 1 .  atorvastatin (LIPITOR) 20 MG tablet, Take 1 tablet (20 mg total) by mouth daily. (Patient not taking: Reported on 11/02/2015), Disp: 90 tablet, Rfl: 1 .  benzonatate (TESSALON) 100 MG capsule, 1-2 po q 8 hours prn cough (Patient not taking: Reported on 11/02/2015), Disp: 20 capsule, Rfl: 3 .  HYDROcodone-acetaminophen (NORCO/VICODIN) 5-325 MG  tablet, Take one-two tabs po q 4-6 hrs prn pain (Patient not taking: Reported on 11/02/2015), Disp: 15 tablet, Rfl: 0 .  montelukast (SINGULAIR) 10 MG tablet, Take 1 tablet (10 mg total) by mouth at bedtime. (Patient not taking: Reported on 11/02/2015), Disp: 90 tablet, Rfl: 1 .  omeprazole (PRILOSEC) 40 MG capsule, Take 1 capsule (40 mg total) by mouth daily. (Patient not taking: Reported on 11/02/2015), Disp: 90 capsule, Rfl: 1 .  traMADol (ULTRAM) 50 MG tablet, Take 1 tablet (50 mg total) by mouth every 6 (six) hours as needed. (Patient not taking: Reported on 11/02/2015), Disp: 60 tablet, Rfl: 3   Review of Systems  Constitutional: Negative for appetite change, chills, diaphoresis, fatigue, fever and unexpected weight change.  HENT: Negative for congestion, drooling, ear pain, facial swelling, hearing loss, mouth sores, sneezing, sore throat, trouble swallowing and voice change.   Eyes: Negative for pain, discharge, redness, itching and visual disturbance.  Respiratory: Negative for cough, choking, shortness of breath and wheezing.   Cardiovascular: Negative for chest pain, palpitations and leg swelling.  Gastrointestinal: Negative for abdominal pain, blood in stool, constipation, diarrhea and vomiting.  Endocrine: Negative for cold intolerance, heat intolerance and polydipsia.  Genitourinary: Negative for decreased urine volume, dysuria and hematuria.  Musculoskeletal: Negative for arthralgias, back pain and gait problem.  Skin: Negative for rash.  Allergic/Immunologic: Negative for environmental allergies.  Neurological: Negative for seizures, syncope,  light-headedness and headaches.  Hematological: Negative for adenopathy.  Psychiatric/Behavioral: Negative for agitation, dysphoric mood and suicidal ideas. The patient is not nervous/anxious.     Per HPI unless specifically indicated above     Objective:    BP 124/86 (BP Location: Left Arm, Patient Position: Sitting, Cuff Size:  Normal)   Pulse 80   Temp 98.6 F (37 C)   Ht 4' 11.75" (1.518 m)   Wt 177 lb 9.6 oz (80.6 kg)   SpO2 99%   BMI 34.98 kg/m   Wt Readings from Last 3 Encounters:  11/02/15 177 lb 9.6 oz (80.6 kg)  10/20/15 176 lb 9.6 oz (80.1 kg)  08/23/15 177 lb 3.2 oz (80.4 kg)    Physical Exam  Constitutional: She is oriented to person, place, and time. She appears well-developed and well-nourished.  HENT:  Head: Normocephalic and atraumatic.  Neck: Neck supple.  Cardiovascular: Normal rate and regular rhythm.   Pulmonary/Chest: Effort normal and breath sounds normal.  Abdominal: Soft. Bowel sounds are normal. She exhibits no mass. There is no hepatosplenomegaly. There is no tenderness.  Musculoskeletal: She exhibits no edema.  Lymphadenopathy:    She has no cervical adenopathy.  Neurological: She is alert and oriented to person, place, and time.  Skin: Skin is warm and dry.  Psychiatric: She has a normal mood and affect. Her behavior is normal.  Vitals reviewed.       Assessment & Plan:    Encounter Diagnoses  Name Primary?  . Chronic obstructive pulmonary disease, unspecified COPD type ([place]) Yes  . Cigarette nicotine dependence with nicotine-induced disorder   . Hyperlipidemia, unspecified hyperlipidemia type   . Gastroesophageal reflux disease, esophagitis presence not specified     -When gets labs, send rx for lipitor to [facility] -counseled pt on smoking cessation -follow up 3 months.  RTO sooner prn

## 2016-02-01 ENCOUNTER — Ambulatory Visit: Payer: Self-pay | Admitting: Physician Assistant

## 2016-02-07 ENCOUNTER — Encounter: Payer: Self-pay | Admitting: Physician Assistant

## 2016-02-09 ENCOUNTER — Encounter (HOSPITAL_COMMUNITY): Payer: Self-pay

## 2016-02-09 ENCOUNTER — Emergency Department (HOSPITAL_COMMUNITY): Payer: Self-pay

## 2016-02-09 ENCOUNTER — Emergency Department (HOSPITAL_COMMUNITY)
Admission: EM | Admit: 2016-02-09 | Discharge: 2016-02-10 | Disposition: A | Discharge status: Home / Self Care | Payer: Self-pay | Attending: Emergency Medicine | Admitting: Emergency Medicine

## 2016-02-09 DIAGNOSIS — J111 Influenza due to unidentified influenza virus with other respiratory manifestations: Secondary | ICD-10-CM

## 2016-02-09 DIAGNOSIS — F17210 Nicotine dependence, cigarettes, uncomplicated: Secondary | ICD-10-CM | POA: Insufficient documentation

## 2016-02-09 DIAGNOSIS — R079 Chest pain, unspecified: Secondary | ICD-10-CM

## 2016-02-09 DIAGNOSIS — R69 Illness, unspecified: Secondary | ICD-10-CM

## 2016-02-09 DIAGNOSIS — J441 Chronic obstructive pulmonary disease with (acute) exacerbation: Secondary | ICD-10-CM | POA: Insufficient documentation

## 2016-02-09 DIAGNOSIS — R0789 Other chest pain: Secondary | ICD-10-CM | POA: Insufficient documentation

## 2016-02-09 LAB — BASIC METABOLIC PANEL
ANION GAP: 14 (ref 5–15)
BUN: 11 mg/dL (ref 6–20)
CHLORIDE: 103 mmol/L (ref 101–111)
CO2: 19 mmol/L — ABNORMAL LOW (ref 22–32)
Calcium: 9.2 mg/dL (ref 8.9–10.3)
Creatinine, Ser: 0.92 mg/dL (ref 0.44–1.00)
GFR calc Af Amer: >60 mL/min (ref >60)
GLUCOSE: 117 mg/dL — AB (ref 65–99)
POTASSIUM: 3.3 mmol/L — AB (ref 3.5–5.1)
Sodium: 136 mmol/L (ref 135–145)

## 2016-02-09 LAB — CBC
HEMATOCRIT: 42 % (ref 36.0–46.0)
HEMOGLOBIN: 14.3 g/dL (ref 12.0–15.0)
MCH: 31.8 pg (ref 26.0–34.0)
MCHC: 34 g/dL (ref 30.0–36.0)
MCV: 93.5 fL (ref 78.0–100.0)
Platelets: 220 10*3/uL (ref 150–400)
RBC: 4.49 MIL/uL (ref 3.87–5.11)
RDW: 13.8 % (ref 11.5–15.5)
WBC: 10 10*3/uL (ref 4.0–10.5)

## 2016-02-09 LAB — I-STAT TROPONIN, ED: Troponin i, poc: 0 ng/mL (ref 0.00–0.08)

## 2016-02-09 MED ORDER — ALBUTEROL SULFATE (2.5 MG/3ML) 0.083% IN NEBU
INHALATION_SOLUTION | RESPIRATORY_TRACT | Status: AC
  Filled 2016-02-09: qty 6

## 2016-02-09 MED ORDER — IPRATROPIUM-ALBUTEROL 0.5-2.5 (3) MG/3ML IN SOLN
3.0000 mL | Freq: Once | RESPIRATORY_TRACT | Status: AC
  Administered 2016-02-09: 3 mL via RESPIRATORY_TRACT
  Filled 2016-02-09: qty 3

## 2016-02-09 MED ORDER — CYCLOBENZAPRINE HCL 10 MG PO TABS
10.0000 mg | ORAL_TABLET | Freq: Once | ORAL | Status: AC
  Administered 2016-02-09: 10 mg via ORAL
  Filled 2016-02-09: qty 1

## 2016-02-09 MED ORDER — PREDNISONE 20 MG PO TABS
60.0000 mg | ORAL_TABLET | Freq: Once | ORAL | Status: AC
  Administered 2016-02-09: 60 mg via ORAL
  Filled 2016-02-09: qty 3

## 2016-02-09 MED ORDER — SODIUM CHLORIDE 0.9 % IV BOLUS (SEPSIS)
1000.0000 mL | Freq: Once | INTRAVENOUS | Status: AC
  Administered 2016-02-09: 1000 mL via INTRAVENOUS

## 2016-02-09 MED ORDER — ALBUTEROL SULFATE (2.5 MG/3ML) 0.083% IN NEBU
5.0000 mg | INHALATION_SOLUTION | Freq: Once | RESPIRATORY_TRACT | Status: AC
  Administered 2016-02-09: 5 mg via RESPIRATORY_TRACT

## 2016-02-09 NOTE — ED Triage Notes (Signed)
Pt reports she has had chest pain described as burning for over one week. Pt reports antacids not helping. Pt also reports diarrhea and thinks she has the flu.

## 2016-02-09 NOTE — ED Provider Notes (Signed)
[facility] DEPT Provider Note   CSN: [id number] Arrival date & time: 02/09/16  1610     History   Chief Complaint Chief Complaint  Patient presents with  . Chest Pain  . Diarrhea    HPI Pamela Livingston is a 51 y.o. female.  HPI   Saturday started having regular asthma/bronchitis symptoms, including cough, wheezing. Hasn't smoked since Monday and not going to any more.  Fever and chills, throwing up since Tuesday.  temp 101. Body aches all over. No other known sick contacts. Fatigue, decreased appetite, congestion, sore throat.   Sick since Saturday, hurts in middle of chest and under left breast. Cold and hot spells off and on. Feeling miserable, chest throbbing, no pain like this before. Chest pain since Tuesday.  On  Chest pain severe, nothing makes it better or worse. Hurts all the time.  When coughing gets headache.   Sister with hx of CAD, stent placement at age 64   Past Medical History:  Diagnosis Date  . Asthma   . Chronic pelvic pain in female   . COPD (chronic obstructive pulmonary disease) ([place])   . Endometriosis   . Polysubstance abuse    marijuana, cocaine, benzos  . Stomach ulcer   . Suicide attempt 2008   by phenergan overdose  . Syncope     Patient Active Problem List   Diagnosis Date Noted  . Cigarette nicotine dependence with nicotine-induced disorder 04/15/2015  . Chronic obstructive pulmonary disease ([place]) 04/15/2015  . Hyperlipidemia 04/15/2015  . Abnormal mammogram 04/15/2015  . Hematuria 03/07/2015  . Abdominal pain, epigastric 03/07/2015  . Cigarette nicotine dependence, uncomplicated [id number]  . History of substance abuse 03/07/2015  . Esophageal reflux 03/07/2015    Past Surgical History:  Procedure Laterality Date  . ABDOMINAL HYSTERECTOMY    . CERVICAL CONE BIOPSY    . CESAREAN SECTION  1993  . CHOLECYSTECTOMY    . RADICAL HYSTERECTOMY WITH TRANSPOSITION OF OVARIES    . TUBAL LIGATION  1993    OB History    No data  available       Home Medications    Prior to Admission medications   Medication Sig Start Date End Date Taking? Authorizing Provider  albuterol (PROVENTIL HFA;VENTOLIN HFA) 108 (90 Base) MCG/ACT inhaler Inhale 2 puffs into the lungs every 6 (six) hours as needed for wheezing or shortness of breath. 10/20/15   Soyla Dryer, PA-C  albuterol (PROVENTIL) (2.5 MG/3ML) 0.083% nebulizer solution Take 3 mLs (2.5 mg total) by nebulization every 6 (six) hours as needed for wheezing or shortness of breath. 10/20/15   Soyla Dryer, PA-C  atorvastatin (LIPITOR) 20 MG tablet Take 1 tablet (20 mg total) by mouth daily. Patient not taking: Reported on 11/02/2015 04/07/15   Soyla Dryer, PA-C  azithromycin (ZITHROMAX) 250 MG tablet Take 1 tablet (250 mg total) by mouth daily. Take first 2 tablets together, then 1 every day until finished. 02/10/16   Gareth Morgan, MD  benzonatate (TESSALON) 100 MG capsule 1-2 po q 8 hours prn cough 02/10/16   Gareth Morgan, MD  cyclobenzaprine (FLEXERIL) 10 MG tablet Take 1 tablet (10 mg total) by mouth 2 (two) times daily as needed for muscle spasms. 02/10/16   Gareth Morgan, MD  diphenhydrAMINE (BENADRYL) 25 MG tablet Take 50 mg by mouth at bedtime.     Historical Provider, MD  mometasone-formoterol (DULERA) 100-5 MCG/ACT AERO Inhale 2 puffs into the lungs 2 (two) times daily. 04/07/15   Soyla Dryer, PA-C  montelukast (SINGULAIR) 10 MG tablet Take 1 tablet (10 mg total) by mouth at bedtime. 11/02/15   Soyla Dryer, PA-C  omeprazole (PRILOSEC) 40 MG capsule Take 1 capsule (40 mg total) by mouth daily. Patient not taking: Reported on 11/02/2015 03/07/15   Soyla Dryer, PA-C  predniSONE (DELTASONE) 10 MG tablet Take 4 tablets (40 mg total) by mouth daily. 02/10/16 02/14/16  Gareth Morgan, MD  traMADol (ULTRAM) 50 MG tablet Take 1 tablet (50 mg total) by mouth every 6 (six) hours as needed. Patient not taking: Reported on 11/02/2015 08/09/15   Sanjuana Kava, MD      Family History Family History  Problem Relation Age of Onset  . Heart disease Mother   . Hypertension Mother   . Diabetes Mother   . Hypertension Father   . Heart attack Maternal Uncle   . COPD Paternal Grandfather   . Heart attack Maternal Uncle     Social History Social History  Substance Use Topics  . Smoking status: Current Every Day Smoker    Packs/day: 0.50    Years: 27.00    Types: Cigarettes  . Smokeless tobacco: Never Used  . Alcohol use Yes     Comment: occassional     Allergies   Asa [aspirin]   Review of Systems Review of Systems  Constitutional: Positive for appetite change, chills and fever.  HENT: Positive for congestion and sore throat.   Eyes: Negative for visual disturbance.  Respiratory: Positive for cough and shortness of breath.   Cardiovascular: Positive for chest pain.  Gastrointestinal: Positive for diarrhea (4-5 times per day), nausea and vomiting. Negative for abdominal pain and constipation.  Genitourinary: Negative for difficulty urinating and dysuria.  Musculoskeletal: Positive for back pain and myalgias. Negative for neck pain.  Skin: Negative for rash.  Neurological: Positive for headaches. Negative for syncope.     Physical Exam Updated Vital Signs BP 118/84   Pulse 94   Temp 98.7 F (37.1 C) (Oral)   Resp 24   SpO2 90%   Physical Exam  Constitutional: She is oriented to person, place, and time. She appears well-developed and well-nourished. No distress.  HENT:  Head: Normocephalic and atraumatic.  Eyes: Conjunctivae and EOM are normal.  Neck: Normal range of motion.  Cardiovascular: Normal rate, regular rhythm, normal heart sounds and intact distal pulses.  Exam reveals no gallop and no friction rub.   No murmur heard. Pulmonary/Chest: Effort normal. No respiratory distress. She has wheezes. She has no rales. She exhibits tenderness.  Abdominal: Soft. She exhibits no distension. There is no tenderness. There is no  guarding.  Musculoskeletal: She exhibits no edema or tenderness.  Neurological: She is alert and oriented to person, place, and time.  Skin: Skin is warm and dry. No rash noted. She is not diaphoretic. No erythema.  Nursing note and vitals reviewed.    ED Treatments / Results  Labs (all labs ordered are listed, but only abnormal results are displayed) Labs Reviewed  BASIC METABOLIC PANEL - Abnormal; Notable for the following:       Result Value   Potassium 3.3 (*)    CO2 19 (*)    Glucose, Bld 117 (*)    All other components within normal limits  CBC  I-STAT TROPOININ, ED  Randolm Idol, ED    EKG  EKG Interpretation None       Radiology Dg Chest 2 View  Result Date: 02/09/2016 CLINICAL DATA:  COPD, cough x6 days. EXAM: CHEST  2  VIEW COMPARISON:  03/02/2015 CXR FINDINGS: Emphysematous hyperinflation of the lungs upper lobe predominant with crowding of lower lobe interstitial lung markings. Probable atelectasis at each lung base. No pneumonic consolidation, effusion or pneumothorax. No overt pulmonary edema. Heart is within normal limits for size. Aorta is not aneurysmal in appearance. No suspicious osseous abnormality. Right upper quadrant cholecystectomy clips in the upper abdomen. Braid artifacts project over the upper lobes on the frontal view. IMPRESSION: Upper lobe predominant hyperinflation of the lungs bilaterally with crowding of lower lobe interstitial lung markings. No acute cardiopulmonary disease. Electronically Signed   By: Ashley Royalty M.D.   On: 02/09/2016 16:50    Procedures Procedures (including critical care time)  Medications Ordered in ED Medications  albuterol (PROVENTIL) (2.5 MG/3ML) 0.083% nebulizer solution (not administered)  albuterol (PROVENTIL HFA;VENTOLIN HFA) 108 (90 Base) MCG/ACT inhaler 2 puff (not administered)  albuterol (PROVENTIL) (2.5 MG/3ML) 0.083% nebulizer solution 5 mg (5 mg Nebulization Given 02/09/16 1623)  sodium chloride 0.9 %  bolus 1,000 mL (0 mLs Intravenous Stopped 02/10/16 0020)  ipratropium-albuterol (DUONEB) 0.5-2.5 (3) MG/3ML nebulizer solution 3 mL (3 mLs Nebulization Given 02/09/16 2304)  predniSONE (DELTASONE) tablet 60 mg (60 mg Oral Given 02/09/16 2304)  cyclobenzaprine (FLEXERIL) tablet 10 mg (10 mg Oral Given 02/09/16 2304)     Initial Impression / Assessment and Plan / ED Course  I have reviewed the triage vital signs and the nursing notes.  Pertinent labs & imaging results that were available during my care of the patient were reviewed by me and considered in my medical decision making (see chart for details).     51 year old female with a history of COPD hyperlipidemia and smoking presents with concern for cough and wheezing since Saturday, with development of fever, chills, body aches, chest pain and diarrhea beginning on Tuesday.  Patient reports feeling miserable all over, however given sister's recent stent placement for coronary artery disease she came in when she developed chest pain in addition to her other body aches.   EKG was evaluated by me and showed no acute changes. CXR without pneumonia. Patient with 2 troponins which are negative. Symptoms described including fevers, body aches, cough, congestion, sore throat more consistent with influenza-like illness. Patient with significant chest wall tenderness on exam, and suspect her chest pain is likely musculoskeletal. Doubt PE/dissection.  However, given her history of smoking and family history of CAD, provided number for cardiology outpatient follow-up.    Pt with diarrhea. Abd exam benign. Given IV fluids for hydration.  Patient describes worsening productive cough, with some wheezing on exam and history of COPD. She is given nebulizer treatments with improvement in the emergency department. She is given albuterol MDI, prednisone 60 mg, will be discharged with 40 mg of prednisone for 4 days and azithromycin for COPD exacerbation.  Discussed  possible tamiflu, however patient reports she will not be able to afford this and given duration of symptoms, side effects of medicine and cost will not prescribe this medication.  Patient given rx for flexeril, tessalon, prednisone, azithromycin. Patient discharged in stable condition with understanding of reasons to return.   Final Clinical Impressions(s) / ED Diagnoses   Final diagnoses:  Influenza-like illness  COPD exacerbation ([place])  Chest pain, unspecified type    New Prescriptions Discharge Medication List as of 02/10/2016  1:16 AM    START taking these medications   Details  azithromycin (ZITHROMAX) 250 MG tablet Take 1 tablet (250 mg total) by mouth daily. Take first 2 tablets  together, then 1 every day until finished., Starting Fri 02/10/2016, Print    cyclobenzaprine (FLEXERIL) 10 MG tablet Take 1 tablet (10 mg total) by mouth 2 (two) times daily as needed for muscle spasms., Starting Fri 02/10/2016, Print    predniSONE (DELTASONE) 10 MG tablet Take 4 tablets (40 mg total) by mouth daily., Starting Fri 02/10/2016, Until Tue 02/14/2016, Print         Gareth Morgan, MD 02/10/16 [phone]

## 2016-02-10 LAB — I-STAT TROPONIN, ED: TROPONIN I, POC: 0 ng/mL (ref 0.00–0.08)

## 2016-02-10 MED ORDER — CYCLOBENZAPRINE HCL 10 MG PO TABS: 10.0000 mg | ORAL_TABLET | Freq: Two times a day (BID) | ORAL | 0 refills | Status: DC | PRN

## 2016-02-10 MED ORDER — ALBUTEROL SULFATE HFA 108 (90 BASE) MCG/ACT IN AERS: 2.0000 | INHALATION_SPRAY | Freq: Once | RESPIRATORY_TRACT | Status: DC

## 2016-02-10 MED ORDER — PREDNISONE 10 MG PO TABS
40.0000 mg | ORAL_TABLET | Freq: Every day | ORAL | 0 refills | Status: AC
Start: 2016-02-10 — End: 2016-02-14

## 2016-02-10 MED ORDER — AZITHROMYCIN 250 MG PO TABS: 250.0000 mg | ORAL_TABLET | Freq: Every day | ORAL | 0 refills | Status: DC

## 2016-02-10 MED ORDER — BENZONATATE 100 MG PO CAPS: ORAL_CAPSULE | ORAL | 3 refills | Status: DC

## 2016-03-30 ENCOUNTER — Other Ambulatory Visit: Payer: Self-pay | Admitting: Physician Assistant

## 2016-05-17 ENCOUNTER — Emergency Department (HOSPITAL_COMMUNITY): Payer: Self-pay

## 2016-05-17 ENCOUNTER — Emergency Department (HOSPITAL_COMMUNITY)
Admission: EM | Admit: 2016-05-17 | Discharge: 2016-05-17 | Disposition: A | Discharge status: Home / Self Care | Payer: Self-pay | Attending: Emergency Medicine | Admitting: Emergency Medicine

## 2016-05-17 ENCOUNTER — Encounter (HOSPITAL_COMMUNITY): Payer: Self-pay | Admitting: Emergency Medicine

## 2016-05-17 DIAGNOSIS — J449 Chronic obstructive pulmonary disease, unspecified: Secondary | ICD-10-CM | POA: Insufficient documentation

## 2016-05-17 DIAGNOSIS — R072 Precordial pain: Secondary | ICD-10-CM

## 2016-05-17 DIAGNOSIS — F17210 Nicotine dependence, cigarettes, uncomplicated: Secondary | ICD-10-CM | POA: Insufficient documentation

## 2016-05-17 DIAGNOSIS — J40 Bronchitis, not specified as acute or chronic: Secondary | ICD-10-CM | POA: Insufficient documentation

## 2016-05-17 DIAGNOSIS — R059 Cough, unspecified: Secondary | ICD-10-CM

## 2016-05-17 DIAGNOSIS — R05 Cough: Secondary | ICD-10-CM

## 2016-05-17 DIAGNOSIS — Z79899 Other long term (current) drug therapy: Secondary | ICD-10-CM | POA: Insufficient documentation

## 2016-05-17 LAB — I-STAT TROPONIN, ED: Troponin i, poc: 0 ng/mL (ref 0.00–0.08)

## 2016-05-17 MED ORDER — PREDNISONE 20 MG PO TABS: 40.0000 mg | ORAL_TABLET | Freq: Every day | ORAL | 0 refills | Status: AC

## 2016-05-17 MED ORDER — PREDNISONE 50 MG PO TABS
60.0000 mg | ORAL_TABLET | Freq: Once | ORAL | Status: AC
  Administered 2016-05-17: 60 mg via ORAL
  Filled 2016-05-17: qty 1

## 2016-05-17 MED ORDER — AZITHROMYCIN 250 MG PO TABS: 250.0000 mg | ORAL_TABLET | Freq: Every day | ORAL | 0 refills | Status: DC

## 2016-05-17 MED ORDER — ALBUTEROL SULFATE HFA 108 (90 BASE) MCG/ACT IN AERS: 1.0000 | INHALATION_SPRAY | Freq: Four times a day (QID) | RESPIRATORY_TRACT | 0 refills | Status: DC | PRN

## 2016-05-17 MED ORDER — IPRATROPIUM-ALBUTEROL 0.5-2.5 (3) MG/3ML IN SOLN
3.0000 mL | Freq: Once | RESPIRATORY_TRACT | Status: AC
  Administered 2016-05-17: 3 mL via RESPIRATORY_TRACT
  Filled 2016-05-17: qty 3

## 2016-05-17 MED ORDER — AZITHROMYCIN 250 MG PO TABS
500.0000 mg | ORAL_TABLET | Freq: Once | ORAL | Status: AC
  Administered 2016-05-17: 500 mg via ORAL
  Filled 2016-05-17: qty 2

## 2016-05-17 NOTE — ED Triage Notes (Signed)
Pt reports dry, nonproductive cough for the past week.

## 2016-05-17 NOTE — ED Provider Notes (Signed)
Emergency Department Provider Note  By signing my name below, I, Hilbert Odor, attest that this documentation has been prepared under the direction and in the presence of Margette Fast, MD. Electronically Signed: Hilbert Odor, Scribe. 05/17/16. 11:12 AM.   I have reviewed the triage vital signs and the nursing notes.   HISTORY  Chief Complaint Cough   HPI Comments: Pamela Livingston is a 51 y.o. female who presents to the Emergency Department complaining of dry, non-productive cough for the past weeks. The patient believes that this may be a flair up of her bronchitis. She reports pain in her chest primarily when she coughs and when she takes deep breaths. She denies chest pain while walking. She also reports noticing some swelling in her left leg last week with some tenderness. She has tried taking Mucinex and benadryl with no relief in her symptoms. She denies hx of blood clots in lungs or legs. She states that her family has a hx of PE. She is a current smoker. She denies abdominal pain, fevers, nausea, or vomiting currently.  Past Medical History:  Diagnosis Date  . Asthma   . Chronic pelvic pain in female   . COPD (chronic obstructive pulmonary disease) ([place])   . Endometriosis   . Polysubstance abuse    marijuana, cocaine, benzos  . Stomach ulcer   . Suicide attempt ([place]) 2008   by phenergan overdose  . Syncope     Patient Active Problem List   Diagnosis Date Noted  . Cigarette nicotine dependence with nicotine-induced disorder 04/15/2015  . Chronic obstructive pulmonary disease ([place]) 04/15/2015  . Hyperlipidemia 04/15/2015  . Abnormal mammogram 04/15/2015  . Hematuria 03/07/2015  . Abdominal pain, epigastric 03/07/2015  . Cigarette nicotine dependence, uncomplicated 93/81/8299  . History of substance abuse 03/07/2015  . Esophageal reflux 03/07/2015    Past Surgical History:  Procedure Laterality Date  . ABDOMINAL HYSTERECTOMY    . CERVICAL CONE BIOPSY    .  CESAREAN SECTION  1993  . CHOLECYSTECTOMY    . RADICAL HYSTERECTOMY WITH TRANSPOSITION OF OVARIES    . TUBAL LIGATION  1993    Current Outpatient Rx  . Order #: [id number] Class: Print  . Order #: [id number] Class: Normal  . Order #: [id number] Class: Print  . Order #: [id number] Class: Print  . Order #: [id number] Class: Print  . Order #: [id number] Class: Historical Med  . Order #: [id number] Class: Normal  . Order #: [id number] Class: Normal  . Order #: [id number] Class: Print  . [START ON 05/18/2016] Order #: [id number] Class: Print  . Order #: [id number] Class: Print    Allergies Asa [aspirin]  Family History  Problem Relation Age of Onset  . Heart disease Mother   . Hypertension Mother   . Diabetes Mother   . Hypertension Father   . Heart attack Maternal Uncle   . COPD Paternal Grandfather   . Heart attack Maternal Uncle     Social History Social History  Substance Use Topics  . Smoking status: Current Every Day Smoker    Packs/day: 0.50    Years: 27.00    Types: Cigarettes  . Smokeless tobacco: Never Used  . Alcohol use Yes     Comment: occasional    Review of Systems Constitutional: No fever/chills Eyes: No visual changes. ENT: No sore throat. Cardiovascular: Positive for chest pain when she coughs. Respiratory: Denies shortness of breath. Positive for cough. Gastrointestinal: No abdominal pain.  No nausea, no vomiting.  No diarrhea.  No constipation. Genitourinary: Negative for dysuria. Musculoskeletal:  Negative for back pain. Positive for lower extremity swelling and tenderness. Skin: Negative for rash. Neurological: Negative for headaches, focal weakness or numbness.  10-point ROS otherwise negative.  ____________________________________________   PHYSICAL EXAM:  VITAL SIGNS: ED Triage Vitals  Enc Vitals Group     BP 05/17/16 1019 137/90     Pulse Rate 05/17/16 1019 73     Resp 05/17/16 1019 18     Temp 05/17/16 1019 97.4 F (36.3 C)     Temp Source  05/17/16 1019 Oral     SpO2 05/17/16 1019 98 %     Weight 05/17/16 1018 160 lb (72.6 kg)     Height 05/17/16 1018 4\' 11"  (1.499 m)     Pain Score 05/17/16 1018 8   Constitutional: Alert and oriented. Well appearing and in no acute distress. Eyes: Conjunctivae are normal.  Head: Atraumatic. Nose: No congestion/rhinnorhea. Mouth/Throat: Mucous membranes are moist.  Oropharynx non-erythematous. Neck: No stridor.   Cardiovascular: Normal rate, regular rhythm. Good peripheral circulation. Grossly normal heart sounds.   Respiratory: Normal respiratory effort.  No retractions. Lungs with faint expiratory wheezing bilaterally.  Gastrointestinal: Soft and nontender. No distention.  Musculoskeletal: No lower extremity tenderness nor edema. No gross deformities of extremities. Neurologic:  Normal speech and language. No gross focal neurologic deficits are appreciated.  Skin:  Skin is warm, dry and intact. No rash noted.  ____________________________________________   LABS (all labs ordered are listed, but only abnormal results are displayed)  Labs Reviewed  I-STAT TROPOININ, ED   ____________________________________________  EKG   EKG Interpretation  Date/Time:  Thursday May 17 2016 11:10:01 EDT Ventricular Rate:  66 PR Interval:    QRS Duration: 99 QT Interval:  407 QTC Calculation: 427 R Axis:   59 Text Interpretation:  Sinus rhythm Low voltage, precordial leads No stemi Confirmed by LONG MD, JOSHUA [phone]) on 05/17/2016 6:51:27 PM       ____________________________________________  RADIOLOGY  Dg Chest 2 View  Result Date: 05/17/2016 CLINICAL DATA:  Dry cough EXAM: CHEST  2 VIEW COMPARISON:  02/09/2016 FINDINGS: Scarring in the lingula. Mild hyperinflation. No acute confluent airspace opacities or effusions. No acute bony abnormality. IMPRESSION: Stable mild hyperinflation.  Lingular scarring.  No active disease. Electronically Signed   By: Rolm Baptise M.D.   On:  05/17/2016 10:33    ____________________________________________   PROCEDURES  Procedure(s) performed:   Procedures  None ____________________________________________   INITIAL IMPRESSION / ASSESSMENT AND PLAN / ED COURSE  Pertinent labs & imaging results that were available during my care of the patient were reviewed by me and considered in my medical decision making (see chart for details).  Patient presents to the ED for evaluation of cough, chest pain, and mild dyspnea. Suspect bronchitis from COPD flare. No hypoxemia. Low pre-test prob for PE and PERC negative. Troponin negative and EKG unremarkable. Plan for bronchitis treatment and PCP follow up.   Specifically counseled on smoking cessation including medications available for assistance. Patient will try to cut back but not willing to set quit date.   At this time, I do not feel there is any life-threatening condition present. I have reviewed and discussed all results (EKG, imaging, lab, urine as appropriate), exam findings with patient. I have reviewed nursing notes and appropriate previous records.  I feel the patient is safe to be discharged home without further emergent workup. Discussed usual and customary return precautions. Patient and family (if present) verbalize understanding and are comfortable with this plan.  Patient will follow-up with their primary care provider. If they do not have a primary care provider, information for follow-up has been provided to them. All questions have been answered.  ____________________________________________  FINAL CLINICAL IMPRESSION(S) / ED DIAGNOSES  Final diagnoses:  Bronchitis  Cough  Precordial pain     MEDICATIONS GIVEN DURING THIS VISIT:  Medications  ipratropium-albuterol (DUONEB) 0.5-2.5 (3) MG/3ML nebulizer solution 3 mL (3 mLs Nebulization Given 05/17/16 1140)  predniSONE (DELTASONE) tablet 60 mg (60 mg Oral Given 05/17/16 1103)  azithromycin (ZITHROMAX) tablet  500 mg (500 mg Oral Given 05/17/16 1103)     NEW OUTPATIENT MEDICATIONS STARTED DURING THIS VISIT:  Discharge Medication List as of 05/17/2016 12:17 PM    START taking these medications   Details  predniSONE (DELTASONE) 20 MG tablet Take 2 tablets (40 mg total) by mouth daily., Starting Fri 05/18/2016, Until Tue 05/22/2016, Print        Note:  This document was prepared using Dragon voice recognition software and may include unintentional dictation errors.  Nanda Quinton, MD Emergency Medicine  I personally performed the services described in this documentation, which was scribed in my presence. The recorded information has been reviewed and is accurate.      Margette Fast, MD 05/17/16 [phone]

## 2016-05-17 NOTE — Discharge Instructions (Signed)

## 2016-07-31 ENCOUNTER — Emergency Department (HOSPITAL_COMMUNITY)
Admission: EM | Admit: 2016-07-31 | Discharge: 2016-07-31 | Disposition: A | Discharge status: Home / Self Care | Payer: Self-pay | Attending: Emergency Medicine | Admitting: Emergency Medicine

## 2016-07-31 ENCOUNTER — Emergency Department (HOSPITAL_COMMUNITY): Payer: Self-pay

## 2016-07-31 ENCOUNTER — Encounter (HOSPITAL_COMMUNITY): Payer: Self-pay | Admitting: Emergency Medicine

## 2016-07-31 DIAGNOSIS — F17210 Nicotine dependence, cigarettes, uncomplicated: Secondary | ICD-10-CM | POA: Insufficient documentation

## 2016-07-31 DIAGNOSIS — Y93H9 Activity, other involving exterior property and land maintenance, building and construction: Secondary | ICD-10-CM | POA: Insufficient documentation

## 2016-07-31 DIAGNOSIS — M545 Low back pain, unspecified: Secondary | ICD-10-CM

## 2016-07-31 DIAGNOSIS — J45909 Unspecified asthma, uncomplicated: Secondary | ICD-10-CM | POA: Insufficient documentation

## 2016-07-31 DIAGNOSIS — Y9271 Barn as the place of occurrence of the external cause: Secondary | ICD-10-CM | POA: Insufficient documentation

## 2016-07-31 DIAGNOSIS — W11XXXA Fall on and from ladder, initial encounter: Secondary | ICD-10-CM | POA: Insufficient documentation

## 2016-07-31 DIAGNOSIS — Y999 Unspecified external cause status: Secondary | ICD-10-CM | POA: Insufficient documentation

## 2016-07-31 DIAGNOSIS — J449 Chronic obstructive pulmonary disease, unspecified: Secondary | ICD-10-CM | POA: Insufficient documentation

## 2016-07-31 DIAGNOSIS — Z79899 Other long term (current) drug therapy: Secondary | ICD-10-CM | POA: Insufficient documentation

## 2016-07-31 MED ORDER — TRAMADOL HCL 50 MG PO TABS
50.0000 mg | ORAL_TABLET | Freq: Once | ORAL | Status: AC
  Administered 2016-07-31: 50 mg via ORAL
  Filled 2016-07-31: qty 1

## 2016-07-31 NOTE — ED Notes (Signed)
Pt is in stable condition upon d/c and ambulates from ED. 

## 2016-07-31 NOTE — ED Provider Notes (Signed)
[facility] DEPT Provider Note   CSN: [id number] Arrival date & time: 07/31/16  1141   By signing my name below, I, Pamela Livingston, attest that this documentation has been prepared under the direction and in the presence of Lenn Sink, PA-C Electronically Signed: [place], ED Scribe. 07/31/16. 9:25 PM.  History   Chief Complaint Chief Complaint  Patient presents with  . Fall  . Back Pain    HPI  Pamela Livingston is a 51 y.o. female who presents to the Emergency Department complaining of a left lower back pain s/p fall onset yesterday. Pt reports associated tingling to LLE. Pt has tried 3 tylenol on yesterday with no relief of her symptoms. She notes that she was on a 3 foot ladder painting the outside of a barn and upon coming down off the ladder her foot slipped causing her to fall backwards off the ladder and strike her left lower back on a fence post. Pt left lower back pain radiates to her LLE. Pt notes that she has a past hx of lower back pain. Denies numbness, hitting her head, LOC, color change, and any other symptoms.    The history is provided by the patient. No language interpreter was used.    Past Medical History:  Diagnosis Date  . Asthma   . Chronic pelvic pain in female   . COPD (chronic obstructive pulmonary disease) ([place])   . Endometriosis   . Polysubstance abuse    marijuana, cocaine, benzos  . Stomach ulcer   . Suicide attempt ([place]) 2008   by phenergan overdose  . Syncope     Patient Active Problem List   Diagnosis Date Noted  . Cigarette nicotine dependence with nicotine-induced disorder 04/15/2015  . Chronic obstructive pulmonary disease ([place]) 04/15/2015  . Hyperlipidemia 04/15/2015  . Abnormal mammogram 04/15/2015  . Hematuria 03/07/2015  . Abdominal pain, epigastric 03/07/2015  . Cigarette nicotine dependence, uncomplicated 85/88/5027  . History of substance abuse 03/07/2015  . Esophageal reflux 03/07/2015    Past Surgical History:  Procedure  Laterality Date  . ABDOMINAL HYSTERECTOMY    . CERVICAL CONE BIOPSY    . CESAREAN SECTION  1993  . CHOLECYSTECTOMY    . RADICAL HYSTERECTOMY WITH TRANSPOSITION OF OVARIES    . TUBAL LIGATION  1993    OB History    No data available       Home Medications    Prior to Admission medications   Medication Sig Start Date End Date Taking? Authorizing Provider  albuterol (PROVENTIL HFA;VENTOLIN HFA) 108 (90 Base) MCG/ACT inhaler Inhale 1-2 puffs into the lungs every 6 (six) hours as needed for wheezing or shortness of breath. 05/17/16   Long, Wonda Olds, MD  atorvastatin (LIPITOR) 20 MG tablet Take 1 tablet (20 mg total) by mouth daily. Patient not taking: Reported on 11/02/2015 04/07/15   Soyla Dryer, PA-C  azithromycin (ZITHROMAX) 250 MG tablet Take 1 tablet (250 mg total) by mouth daily. Take first 2 tablets together, then 1 every day until finished. 05/17/16   Long, Wonda Olds, MD  benzonatate (TESSALON) 100 MG capsule 1-2 po q 8 hours prn cough 02/10/16   Gareth Morgan, MD  cyclobenzaprine (FLEXERIL) 10 MG tablet Take 1 tablet (10 mg total) by mouth 2 (two) times daily as needed for muscle spasms. 02/10/16   Gareth Morgan, MD  diphenhydrAMINE (BENADRYL) 25 MG tablet Take 50 mg by mouth at bedtime.     [provider]  mometasone-formoterol (DULERA) 100-5 MCG/ACT AERO Inhale  2 puffs into the lungs 2 (two) times daily. 04/07/15   Soyla Dryer, PA-C  montelukast (SINGULAIR) 10 MG tablet Take 1 tablet (10 mg total) by mouth at bedtime. 11/02/15   Soyla Dryer, PA-C  omeprazole (PRILOSEC) 40 MG capsule Take 1 capsule (40 mg total) by mouth daily. Patient not taking: Reported on 11/02/2015 03/07/15   Soyla Dryer, PA-C  traMADol (ULTRAM) 50 MG tablet Take 1 tablet (50 mg total) by mouth every 6 (six) hours as needed. Patient not taking: Reported on 11/02/2015 08/09/15   Sanjuana Kava, MD    Family History Family History  Problem Relation Age of Onset  . Heart disease  Mother   . Hypertension Mother   . Diabetes Mother   . Hypertension Father   . Heart attack Maternal Uncle   . COPD Paternal Grandfather   . Heart attack Maternal Uncle     Social History Social History  Substance Use Topics  . Smoking status: Current Every Day Smoker    Packs/day: 0.50    Years: 27.00    Types: Cigarettes  . Smokeless tobacco: Never Used  . Alcohol use Yes     Comment: occasional     Allergies   Asa [aspirin]   Review of Systems Review of Systems  Musculoskeletal: Positive for back pain.  Skin: Negative for color change and wound.  Neurological: Negative for syncope and numbness.       +tingling to LLE  All other systems reviewed and are negative.    Physical Exam Updated Vital Signs BP (!) 124/93 (BP Location: Left Arm)   Pulse 85   Temp 98.8 F (37.1 C) (Oral)   Resp 18   Ht 4\' 11"  (1.499 m)   Wt 170 lb (77.1 kg)   SpO2 100%   BMI 34.34 kg/m   Physical Exam  Constitutional: She is oriented to person, place, and time. She appears well-developed and well-nourished. No distress.  HENT:  Head: Normocephalic and atraumatic.  Eyes: EOM are normal.  Neck: Normal range of motion. Neck supple.  Cardiovascular: Normal rate.   Pulmonary/Chest: Effort normal. No respiratory distress.  Abdominal: She exhibits no distension.  Musculoskeletal: Normal range of motion. She exhibits tenderness. She exhibits no edema.       Left hip: She exhibits tenderness.  Minor TTP of the left posterior hip and soft tissue. No C, T, or L spine tenderness to palpation. No obvious signs of trauma, deformity, infection, step-offs. Lung expansion normal. No scoliosis or kyphosis. Bilateral lower extremity strength 5 out of 5, sensation and motor grossly intact.  Neurological: She is alert and oriented to person, place, and time.  Skin: Skin is warm and dry. She is not diaphoretic.  Psychiatric: She has a normal mood and affect. Her behavior is normal. Judgment and  thought content normal.  Nursing note and vitals reviewed.    ED Treatments / Results  DIAGNOSTIC STUDIES: Oxygen Saturation is 100% on RA, nl by my interpretation.    COORDINATION OF CARE: 2:30 PM Discussed treatment plan with pt at bedside which includes lumbar spine xray and pt agreed to plan.   Labs (all labs ordered are listed, but only abnormal results are displayed) Labs Reviewed - No data to display  EKG  EKG Interpretation None       Radiology Dg Lumbar Spine Complete  Result Date: 07/31/2016 CLINICAL DATA:  Recent fall off a ladder, low back and left leg pain EXAM: LUMBAR SPINE - COMPLETE 4+ VIEW COMPARISON:  CT abdomen pelvis of 06/30/2015 FINDINGS: There is a mild 4 mm anterolisthesis of L4 on L5 most likely degenerative in origin. There is mild degenerative disc disease at L4-5. No compression deformity is seen. There is degenerative change involving the facet joints of L4-5 and L5-S1. The SI joints appear corticated. IMPRESSION: 1. No acute fracture. 2. 4 mm anterolisthesis of L4 on L5 with mild degenerative disc disease at L4-5. Electronically Signed   By: Ivar Drape M.D.   On: 07/31/2016 15:10    Procedures Procedures (including critical care time)  Medications Ordered in ED Medications  traMADol (ULTRAM) tablet 50 mg (50 mg Oral Given 07/31/16 1535)     Initial Impression / Assessment and Plan / ED Course  I have reviewed the triage vital signs and the nursing notes.  Pertinent labs & imaging results that were available during my care of the patient were reviewed by me and considered in my medical decision making (see chart for details).      Labs:   Imaging:  Consults:  Therapeutics:  Discharge Meds:   Assessment/Plan: . Pt presents with lower back pain s/p fall from 3 foot ladder. No neurological deficits and normal neuro exam. Patient is ambulatory. No loss of bowel or bladder control. No concern for cauda equina. No fever, night sweats,  weight loss, h/o cancer, IVDA, no recent procedure to back. No urinary symptoms suggestive of UTI. lumbar xray negative for acute abnormalities. Pt will discharged with . Supportive care and return precaution discussed. Appears safe for discharge at this time. Follow up as indicated in discharge paperwork.    Final Clinical Impressions(s) / ED Diagnoses   Final diagnoses:  Acute left-sided low back pain without sciatica    New Prescriptions Discharge Medication List as of 07/31/2016  3:58 PM     I personally performed the services described in this documentation, which was scribed in my presence. The recorded information has been reviewed and is accurate.    Okey Regal, PA-C 07/31/16 2125    Virgel Manifold, MD 08/01/16 1723

## 2016-07-31 NOTE — ED Triage Notes (Signed)
Pt. Stated, I fell off a 3 foot ladder and hit a post on my back

## 2016-07-31 NOTE — Discharge Instructions (Signed)
Please read attached information. If you experience any new or worsening signs or symptoms please return to the emergency room for evaluation. Please follow-up with your primary care provider or specialist as discussed.  Please use Tylenol as needed for pain.

## 2016-11-21 ENCOUNTER — Emergency Department (HOSPITAL_COMMUNITY)
Admission: EM | Admit: 2016-11-21 | Discharge: 2016-11-21 | Disposition: A | Discharge status: Home / Self Care | Payer: Self-pay | Attending: Emergency Medicine | Admitting: Emergency Medicine

## 2016-11-21 ENCOUNTER — Emergency Department (HOSPITAL_COMMUNITY): Payer: Self-pay

## 2016-11-21 ENCOUNTER — Encounter (HOSPITAL_COMMUNITY): Payer: Self-pay | Admitting: Emergency Medicine

## 2016-11-21 DIAGNOSIS — Z79899 Other long term (current) drug therapy: Secondary | ICD-10-CM | POA: Insufficient documentation

## 2016-11-21 DIAGNOSIS — J40 Bronchitis, not specified as acute or chronic: Secondary | ICD-10-CM

## 2016-11-21 DIAGNOSIS — J209 Acute bronchitis, unspecified: Secondary | ICD-10-CM | POA: Insufficient documentation

## 2016-11-21 DIAGNOSIS — J449 Chronic obstructive pulmonary disease, unspecified: Secondary | ICD-10-CM | POA: Insufficient documentation

## 2016-11-21 DIAGNOSIS — F17210 Nicotine dependence, cigarettes, uncomplicated: Secondary | ICD-10-CM | POA: Insufficient documentation

## 2016-11-21 DIAGNOSIS — J45909 Unspecified asthma, uncomplicated: Secondary | ICD-10-CM | POA: Insufficient documentation

## 2016-11-21 LAB — TROPONIN I: Troponin I: <0.03 ng/mL (ref <0.03)

## 2016-11-21 LAB — CBC WITH DIFFERENTIAL/PLATELET
Basophils Absolute: 0 10*3/uL (ref 0.0–0.1)
Basophils Relative: 0 %
Eosinophils Absolute: 0.4 10*3/uL (ref 0.0–0.7)
Eosinophils Relative: 5 %
HCT: 42.4 % (ref 36.0–46.0)
HEMOGLOBIN: 14.7 g/dL (ref 12.0–15.0)
LYMPHS ABS: 2.3 10*3/uL (ref 0.7–4.0)
LYMPHS PCT: 33 %
MCH: 33.9 pg (ref 26.0–34.0)
MCHC: 34.7 g/dL (ref 30.0–36.0)
MCV: 97.9 fL (ref 78.0–100.0)
MONOS PCT: 5 %
Monocytes Absolute: 0.4 10*3/uL (ref 0.1–1.0)
NEUTROS PCT: 57 %
Neutro Abs: 4.1 10*3/uL (ref 1.7–7.7)
Platelets: 314 10*3/uL (ref 150–400)
RBC: 4.33 MIL/uL (ref 3.87–5.11)
RDW: 13.4 % (ref 11.5–15.5)
WBC: 7.2 10*3/uL (ref 4.0–10.5)

## 2016-11-21 LAB — BASIC METABOLIC PANEL
ANION GAP: 9 (ref 5–15)
BUN: 14 mg/dL (ref 6–20)
CALCIUM: 9.7 mg/dL (ref 8.9–10.3)
CO2: 24 mmol/L (ref 22–32)
CREATININE: 0.69 mg/dL (ref 0.44–1.00)
Chloride: 107 mmol/L (ref 101–111)
Glucose, Bld: 94 mg/dL (ref 65–99)
Potassium: 4.1 mmol/L (ref 3.5–5.1)
Sodium: 140 mmol/L (ref 135–145)

## 2016-11-21 MED ORDER — TRAMADOL HCL 50 MG PO TABS
50.0000 mg | ORAL_TABLET | Freq: Once | ORAL | Status: AC
  Administered 2016-11-21: 50 mg via ORAL
  Filled 2016-11-21: qty 1

## 2016-11-21 MED ORDER — PREDNISONE 20 MG PO TABS
40.0000 mg | ORAL_TABLET | Freq: Once | ORAL | Status: AC
  Administered 2016-11-21: 40 mg via ORAL
  Filled 2016-11-21: qty 2

## 2016-11-21 MED ORDER — IPRATROPIUM-ALBUTEROL 0.5-2.5 (3) MG/3ML IN SOLN
3.0000 mL | Freq: Once | RESPIRATORY_TRACT | Status: AC
  Administered 2016-11-21: 3 mL via RESPIRATORY_TRACT
  Filled 2016-11-21: qty 3

## 2016-11-21 MED ORDER — PREDNISONE 20 MG PO TABS: 40.0000 mg | ORAL_TABLET | Freq: Every day | ORAL | 0 refills | Status: DC

## 2016-11-21 MED ORDER — ALBUTEROL SULFATE (2.5 MG/3ML) 0.083% IN NEBU
2.5000 mg | INHALATION_SOLUTION | Freq: Once | RESPIRATORY_TRACT | Status: AC
  Administered 2016-11-21: 2.5 mg via RESPIRATORY_TRACT
  Filled 2016-11-21: qty 3

## 2016-11-21 MED ORDER — ALBUTEROL SULFATE HFA 108 (90 BASE) MCG/ACT IN AERS
2.0000 | INHALATION_SPRAY | Freq: Once | RESPIRATORY_TRACT | Status: AC
  Administered 2016-11-21: 2 via RESPIRATORY_TRACT
  Filled 2016-11-21: qty 6.7

## 2016-11-21 MED ORDER — PREDNISONE 10 MG PO TABS: 10.0000 mg | ORAL_TABLET | Freq: Every day | ORAL | 0 refills | Status: DC

## 2016-11-21 MED ORDER — BENZONATATE 100 MG PO CAPS: 200.0000 mg | ORAL_CAPSULE | Freq: Three times a day (TID) | ORAL | 0 refills | Status: DC | PRN

## 2016-11-21 NOTE — Discharge Instructions (Signed)
Be sure to keep your appt with the clinic.  2 puffs of the inhaler 4 times a day as needed.  Return here for any worsening symptoms

## 2016-11-21 NOTE — ED Provider Notes (Signed)
[facility] EMERGENCY DEPARTMENT Provider Note   CSN: [id number] Arrival date & time: 11/21/16  0825     History   Chief Complaint Chief Complaint  Patient presents with  . Cough    HPI Pamela Livingston is a 51 y.o. female.  HPI   Pamela Livingston is a 51 y.o. female who presents to the Emergency Department complaining of cough for several days.  Cough is associated with left sided chest pain that is worse with deep breathing.  She has a hx of asthma and COPD and has been without her medications for a while.  Cough has been productive on occasion and she also complains of a right sided headache of gradual onset after the coughing began.  Pain has been unrelieved with Tylenol.  She describes upper chest tightness with exertion and cough.  Denies fever, abdominal pain, neck pain or stiffness, visual changes, nausea and vomiting.   Past Medical History:  Diagnosis Date  . Asthma   . Chronic pelvic pain in female   . COPD (chronic obstructive pulmonary disease) ([place])   . Endometriosis   . Polysubstance abuse (HCC)    marijuana, cocaine, benzos  . Stomach ulcer   . Suicide attempt ([place]) 2008   by phenergan overdose  . Syncope     Patient Active Problem List   Diagnosis Date Noted  . Cigarette nicotine dependence with nicotine-induced disorder 04/15/2015  . Chronic obstructive pulmonary disease ([place]) 04/15/2015  . Hyperlipidemia 04/15/2015  . Abnormal mammogram 04/15/2015  . Hematuria 03/07/2015  . Abdominal pain, epigastric 03/07/2015  . Cigarette nicotine dependence, uncomplicated 70/62/3762  . History of substance abuse 03/07/2015  . Esophageal reflux 03/07/2015    Past Surgical History:  Procedure Laterality Date  . ABDOMINAL HYSTERECTOMY    . CERVICAL CONE BIOPSY    . CESAREAN SECTION  1993  . CHOLECYSTECTOMY    . RADICAL HYSTERECTOMY WITH TRANSPOSITION OF OVARIES    . TUBAL LIGATION  1993    OB History    No data available       Home Medications    Prior to  Admission medications   Medication Sig Start Date End Date Taking? Authorizing Provider  albuterol (PROVENTIL HFA;VENTOLIN HFA) 108 (90 Base) MCG/ACT inhaler Inhale 1-2 puffs into the lungs every 6 (six) hours as needed for wheezing or shortness of breath. 05/17/16   Long, Wonda Olds, MD  atorvastatin (LIPITOR) 20 MG tablet Take 1 tablet (20 mg total) by mouth daily. Patient not taking: Reported on 11/02/2015 04/07/15   Soyla Dryer, PA-C  azithromycin (ZITHROMAX) 250 MG tablet Take 1 tablet (250 mg total) by mouth daily. Take first 2 tablets together, then 1 every day until finished. 05/17/16   Long, Wonda Olds, MD  benzonatate (TESSALON) 100 MG capsule 1-2 po q 8 hours prn cough 02/10/16   Gareth Morgan, MD  cyclobenzaprine (FLEXERIL) 10 MG tablet Take 1 tablet (10 mg total) by mouth 2 (two) times daily as needed for muscle spasms. 02/10/16   Gareth Morgan, MD  diphenhydrAMINE (BENADRYL) 25 MG tablet Take 50 mg by mouth at bedtime.     [provider]  mometasone-formoterol (DULERA) 100-5 MCG/ACT AERO Inhale 2 puffs into the lungs 2 (two) times daily. 04/07/15   Soyla Dryer, PA-C  montelukast (SINGULAIR) 10 MG tablet Take 1 tablet (10 mg total) by mouth at bedtime. 11/02/15   Soyla Dryer, PA-C  omeprazole (PRILOSEC) 40 MG capsule Take 1 capsule (40 mg total) by mouth daily. Patient not taking:  Reported on 11/02/2015 03/07/15   Soyla Dryer, PA-C  traMADol (ULTRAM) 50 MG tablet Take 1 tablet (50 mg total) by mouth every 6 (six) hours as needed. Patient not taking: Reported on 11/02/2015 08/09/15   Sanjuana Kava, MD    Family History Family History  Problem Relation Age of Onset  . Heart disease Mother   . Hypertension Mother   . Diabetes Mother   . Hypertension Father   . Heart attack Maternal Uncle   . COPD Paternal Grandfather   . Heart attack Maternal Uncle     Social History Social History  Substance Use Topics  . Smoking status: Current Every Day Smoker     Packs/day: 0.50    Years: 27.00    Types: Cigarettes  . Smokeless tobacco: Never Used  . Alcohol use Yes     Comment: occasional     Allergies   Asa [aspirin]   Review of Systems Review of Systems  Constitutional: Negative for appetite change, chills and fever.  HENT: Positive for congestion. Negative for sore throat and trouble swallowing.   Eyes: Negative for visual disturbance.  Respiratory: Positive for cough, chest tightness and shortness of breath. Negative for wheezing.   Cardiovascular: Positive for chest pain.  Gastrointestinal: Negative for abdominal pain, nausea and vomiting.  Genitourinary: Negative for dysuria.  Musculoskeletal: Negative for arthralgias, neck pain and neck stiffness.  Skin: Negative for rash.  Neurological: Positive for headaches. Negative for dizziness, weakness and numbness.  Hematological: Negative for adenopathy.  All other systems reviewed and are negative.    Physical Exam Updated Vital Signs BP (!) 141/91 (BP Location: Right Arm)   Pulse 84   Temp 98 F (36.7 C) (Oral)   Resp 18   Ht 4\' 11"  (1.499 m)   Wt 69.9 kg (154 lb)   SpO2 98%   BMI 31.10 kg/m   Physical Exam  Constitutional: She is oriented to person, place, and time. She appears well-developed and well-nourished. No distress.  HENT:  Head: Normocephalic and atraumatic.  Right Ear: Tympanic membrane and ear canal normal.  Left Ear: Tympanic membrane and ear canal normal.  Mouth/Throat: Uvula is midline, oropharynx is clear and moist and mucous membranes are normal. No oropharyngeal exudate.  Eyes: Pupils are equal, round, and reactive to light. EOM are normal.  Neck: Trachea normal, normal range of motion, full passive range of motion without pain and phonation normal. Neck supple. No neck rigidity. No Kernig's sign noted.  Cardiovascular: Normal rate, regular rhythm and intact distal pulses.   No murmur heard. Pulmonary/Chest: Effort normal. No stridor. No respiratory  distress. She has no rales. She exhibits no tenderness.  Lung sounds are slightly diminished on the left.  No rales.   Abdominal: Soft. She exhibits no distension and no mass. There is no tenderness. There is no guarding.  Musculoskeletal: She exhibits no edema.  Lymphadenopathy:    She has no cervical adenopathy.  Neurological: She is alert and oriented to person, place, and time. No sensory deficit. She exhibits normal muscle tone. Coordination normal.  Skin: Skin is warm and dry. Capillary refill takes less than 2 seconds.  Psychiatric: She has a normal mood and affect.  Nursing note and vitals reviewed.    ED Treatments / Results  Labs (all labs ordered are listed, but only abnormal results are displayed) Labs Reviewed  TROPONIN I  CBC WITH DIFFERENTIAL/PLATELET  BASIC METABOLIC PANEL    EKG  EKG Interpretation  Date/Time:  Wednesday November 21 2016 09:18:43 EDT Ventricular Rate:  67 PR Interval:    QRS Duration: 94 QT Interval:  410 QTC Calculation: 433 R Axis:   54 Text Interpretation:  Sinus rhythm Borderline short PR interval Abnormal R-wave progression, early transition Baseline wander in lead(s) I III aVR aVL aVF No STEMI.  Confirmed by Nanda Quinton [phone]) on 11/21/2016 9:27:41 AM       Radiology Dg Chest 2 View  Result Date: 11/21/2016 CLINICAL DATA:  Cough and chest pain EXAM: CHEST  2 VIEW COMPARISON:  05/17/2016 FINDINGS: Normal heart size and mediastinal contours. No acute infiltrate or edema. No effusion or pneumothorax. No acute osseous findings. IMPRESSION: Negative chest. Electronically Signed   By: Monte Fantasia M.D.   On: 11/21/2016 09:59    Procedures Procedures (including critical care time)  Medications Ordered in ED Medications  ipratropium-albuterol (DUONEB) 0.5-2.5 (3) MG/3ML nebulizer solution 3 mL (3 mLs Nebulization Given 11/21/16 0926)  albuterol (PROVENTIL HFA;VENTOLIN HFA) 108 (90 Base) MCG/ACT inhaler 2 puff (2 puffs Inhalation  Given 11/21/16 0937)  albuterol (PROVENTIL) (2.5 MG/3ML) 0.083% nebulizer solution 2.5 mg (2.5 mg Nebulization Given 11/21/16 0926)  predniSONE (DELTASONE) tablet 40 mg (40 mg Oral Given 11/21/16 0914)  traMADol (ULTRAM) tablet 50 mg (50 mg Oral Given 11/21/16 0914)     Initial Impression / Assessment and Plan / ED Course  I have reviewed the triage vital signs and the nursing notes.  Pertinent labs & imaging results that were available during my care of the patient were reviewed by me and considered in my medical decision making (see chart for details).     1050  Pt is feeling better after neb and medication.  Vitals reassuring. Lung sounds improved,  No focal neuro deficits, no nuchal rigidity.  Headache gradual in onset and secondary to cough.  Low clinical concern for PE.  Pt has been without her routine medications for some time, but has upcoming appt with [facility] for [facility] with refills.  Albuterol MDI dispensed here and rx for tessalon and prednisone.  She appears stable for d/c.  Return precautions discussed.    Final Clinical Impressions(s) / ED Diagnoses   Final diagnoses:  Bronchitis    New Prescriptions New Prescriptions   No medications on file     Kem Parkinson, PA-C 11/21/16 Julian, Wonda Olds, MD 11/21/16 1539

## 2016-11-21 NOTE — ED Triage Notes (Signed)
Pt reports headache, cough for last several days. Pt reports chest discomfort started after cough and reports is worse with a deep breath or movement.

## 2016-11-27 ENCOUNTER — Encounter: Payer: Self-pay | Admitting: Physician Assistant

## 2016-11-27 ENCOUNTER — Ambulatory Visit: Payer: Self-pay | Admitting: Physician Assistant

## 2016-11-27 VITALS — BP 126/80 | HR 89 | Temp 99.1°F | Ht 59.5 in | Wt 147.0 lb

## 2016-11-27 DIAGNOSIS — J449 Chronic obstructive pulmonary disease, unspecified: Secondary | ICD-10-CM

## 2016-11-27 DIAGNOSIS — R634 Abnormal weight loss: Secondary | ICD-10-CM

## 2016-11-27 DIAGNOSIS — E785 Hyperlipidemia, unspecified: Secondary | ICD-10-CM

## 2016-11-27 DIAGNOSIS — F17219 Nicotine dependence, cigarettes, with unspecified nicotine-induced disorders: Secondary | ICD-10-CM

## 2016-11-27 DIAGNOSIS — Z1239 Encounter for other screening for malignant neoplasm of breast: Secondary | ICD-10-CM

## 2016-11-27 DIAGNOSIS — J441 Chronic obstructive pulmonary disease with (acute) exacerbation: Secondary | ICD-10-CM

## 2016-11-27 DIAGNOSIS — R7303 Prediabetes: Secondary | ICD-10-CM

## 2016-11-27 DIAGNOSIS — Z87448 Personal history of other diseases of urinary system: Secondary | ICD-10-CM

## 2016-11-27 DIAGNOSIS — R319 Hematuria, unspecified: Secondary | ICD-10-CM

## 2016-11-27 DIAGNOSIS — K219 Gastro-esophageal reflux disease without esophagitis: Secondary | ICD-10-CM

## 2016-11-27 LAB — POCT URINALYSIS DIPSTICK
Glucose, UA: NEGATIVE
LEUKOCYTES UA: NEGATIVE
Nitrite, UA: NEGATIVE
PROTEIN UA: 30
SPEC GRAV UA: 1.02 (ref 1.010–1.025)
Urobilinogen, UA: 0.2 E.U./dL
pH, UA: 5 (ref 5.0–8.0)

## 2016-11-27 MED ORDER — ALBUTEROL SULFATE HFA 108 (90 BASE) MCG/ACT IN AERS: 2.0000 | INHALATION_SPRAY | Freq: Four times a day (QID) | RESPIRATORY_TRACT | 1 refills | Status: DC | PRN

## 2016-11-27 MED ORDER — OMEPRAZOLE 40 MG PO CPDR: 40.0000 mg | DELAYED_RELEASE_CAPSULE | Freq: Every day | ORAL | 1 refills | Status: DC

## 2016-11-27 NOTE — Progress Notes (Signed)
BP 126/80 (BP Location: Left Arm, Patient Position: Sitting, Cuff Size: Normal)   Pulse 89   Temp 99.1 F (37.3 C) (Other (Comment))   Ht 4' 11.5" (1.511 m)   Wt 147 lb (66.7 kg)   SpO2 99%   BMI 29.19 kg/m    Subjective:    Patient ID: Pamela Livingston, female    DOB: 07-17-1965, 51 y.o.   MRN: [id number]  HPI: Pamela Livingston is a 51 y.o. female presenting on 11/27/2016 for New Patient (Initial Visit) (new/old pt)   HPI   Last seen here 11/02/2015  Pt was seen in ER last week for bronchitis.  Pt still with cough at times.  C/o HA.  Pt states a little bit of improvement in breathing.   Pt says she has lost about 30 pounds in past 3 months and is not trying.   She complains of puspocket next to breast. She says it has been there for about a year, since her last mammogram(03/2015) .   She is still having a lot of problems with GERD  Pt never saw anyone for the hematuria (she was referred when she was a pt here in the past)  Relevant past medical, surgical, family and social history reviewed and updated as indicated. Interim medical history since our last visit reviewed. Allergies and medications reviewed and updated.   Current Outpatient Medications:  .  acetaminophen (TYLENOL) 500 MG tablet, Take 2,000 mg by mouth every 8 (eight) hours as needed for headache., Disp: , Rfl:  .  benzonatate (TESSALON) 100 MG capsule, Take 2 capsules (200 mg total) by mouth 3 (three) times daily as needed for cough. Swallow whole, do not chew, Disp: 21 capsule, Rfl: 0 .  diphenhydrAMINE (BENADRYL) 25 MG tablet, Take 50 mg by mouth at bedtime. , Disp: , Rfl:  .  predniSONE (DELTASONE) 20 MG tablet, Take 2 tablets (40 mg total) by mouth daily., Disp: 10 tablet, Rfl: 0   Review of Systems  Constitutional: Negative for appetite change, chills, diaphoresis, fatigue, fever and unexpected weight change.  HENT: Positive for congestion. Negative for dental problem, drooling, ear pain, facial swelling, hearing  loss, mouth sores, sneezing, sore throat, trouble swallowing and voice change.   Eyes: Negative for pain, discharge, redness, itching and visual disturbance.  Respiratory: Positive for cough, shortness of breath and wheezing. Negative for choking.   Cardiovascular: Negative for chest pain, palpitations and leg swelling.  Gastrointestinal: Negative for abdominal pain, blood in stool, constipation, diarrhea and vomiting.  Endocrine: Negative for cold intolerance, heat intolerance and polydipsia.  Genitourinary: Negative for decreased urine volume, dysuria and hematuria.  Musculoskeletal: Positive for back pain. Negative for arthralgias and gait problem.  Skin: Negative for rash.  Allergic/Immunologic: Negative for environmental allergies.  Neurological: Positive for headaches. Negative for seizures, syncope and light-headedness.  Hematological: Negative for adenopathy.  Psychiatric/Behavioral: Negative for agitation, dysphoric mood and suicidal ideas. The patient is not nervous/anxious.     Per HPI unless specifically indicated above     Objective:    BP 126/80 (BP Location: Left Arm, Patient Position: Sitting, Cuff Size: Normal)   Pulse 89   Temp 99.1 F (37.3 C) (Other (Comment))   Ht 4' 11.5" (1.511 m)   Wt 147 lb (66.7 kg)   SpO2 99%   BMI 29.19 kg/m   Wt Readings from Last 3 Encounters:  11/27/16 147 lb (66.7 kg)  11/21/16 154 lb (69.9 kg)  07/31/16 170 lb (77.1 kg)    Physical  Exam  Constitutional: She is oriented to person, place, and time. She appears well-developed and well-nourished.  HENT:  Head: Normocephalic and atraumatic.  Mouth/Throat: Oropharynx is clear and moist. No oropharyngeal exudate.  Eyes: Conjunctivae and EOM are normal. Pupils are equal, round, and reactive to light.  Neck: Neck supple. No thyromegaly present.  Cardiovascular: Normal rate and regular rhythm.  Pulmonary/Chest: Effort normal and breath sounds normal.  Abdominal: Soft. Bowel sounds are  normal. She exhibits no mass. There is no hepatosplenomegaly. There is no tenderness.  Genitourinary: No breast swelling, tenderness, discharge or bleeding.  Genitourinary Comments: Breast exam normal.  No "puspocket" present  Musculoskeletal: She exhibits no edema.  Lymphadenopathy:    She has no cervical adenopathy.  Neurological: She is alert and oriented to person, place, and time. Gait normal.  Skin: Skin is warm and dry.  Psychiatric: She has a normal mood and affect. Her behavior is normal.  Vitals reviewed.       Assessment & Plan:    Encounter Diagnoses  Name Primary?  . Chronic obstructive pulmonary disease, unspecified COPD type ([place]) Yes  . COPD exacerbation ([place])   . Cigarette nicotine dependence with nicotine-induced disorder   . Hyperlipidemia, unspecified hyperlipidemia type   . Weight loss   . Prediabetes   . Screening for breast cancer   . History of hematuria   . Hematuria, unspecified type   . Gastroesophageal reflux disease, esophagitis presence not specified     -pt to get fasting labs drawn tomorrow morning  -will order screening mammogram -pt is signed up for [facility]- (albuterol, omeprazole) -pt is given [facility] discount appllication -will Refer to urologist- for hematuria -pt counseled on smoking cessation -pt to finish meds given in ER.  Inhaler was ordered for her as well -pt to follow up 1 month.  RTO sooner prn

## 2016-11-28 ENCOUNTER — Other Ambulatory Visit (HOSPITAL_COMMUNITY)
Admission: RE | Admit: 2016-11-28 | Discharge: 2016-11-28 | Disposition: A | Discharge status: Home / Self Care | Payer: Self-pay | Source: Ambulatory Visit | Attending: Physician Assistant | Admitting: Physician Assistant

## 2016-11-28 DIAGNOSIS — R634 Abnormal weight loss: Secondary | ICD-10-CM | POA: Insufficient documentation

## 2016-11-28 DIAGNOSIS — R7303 Prediabetes: Secondary | ICD-10-CM | POA: Insufficient documentation

## 2016-11-28 DIAGNOSIS — E785 Hyperlipidemia, unspecified: Secondary | ICD-10-CM | POA: Insufficient documentation

## 2016-11-28 LAB — COMPREHENSIVE METABOLIC PANEL
ALBUMIN: 4.4 g/dL (ref 3.5–5.0)
ALK PHOS: 77 U/L (ref 38–126)
ALT: 13 U/L — AB (ref 14–54)
AST: 13 U/L — AB (ref 15–41)
Anion gap: 11 (ref 5–15)
BUN: 19 mg/dL (ref 6–20)
CALCIUM: 9.6 mg/dL (ref 8.9–10.3)
CO2: 24 mmol/L (ref 22–32)
CREATININE: 0.8 mg/dL (ref 0.44–1.00)
Chloride: 102 mmol/L (ref 101–111)
GFR calc Af Amer: >60 mL/min (ref >60)
GFR calc non Af Amer: >60 mL/min (ref >60)
GLUCOSE: 112 mg/dL — AB (ref 65–99)
Potassium: 4 mmol/L (ref 3.5–5.1)
SODIUM: 137 mmol/L (ref 135–145)
Total Bilirubin: 0.7 mg/dL (ref 0.3–1.2)
Total Protein: 7.5 g/dL (ref 6.5–8.1)

## 2016-11-28 LAB — LIPID PANEL
Cholesterol: 218 mg/dL — ABNORMAL HIGH (ref 0–200)
HDL: 56 mg/dL (ref >40)
LDL CALC: 136 mg/dL — AB (ref 0–99)
Total CHOL/HDL Ratio: 3.9 RATIO
Triglycerides: 131 mg/dL (ref <150)
VLDL: 26 mg/dL (ref 0–40)

## 2016-11-28 LAB — TSH: TSH: 3.319 u[IU]/mL (ref 0.350–4.500)

## 2016-11-28 LAB — HEMOGLOBIN A1C
Hgb A1c MFr Bld: 5.7 % — ABNORMAL HIGH (ref 4.8–5.6)
Mean Plasma Glucose: 116.89 mg/dL

## 2016-12-07 ENCOUNTER — Other Ambulatory Visit: Payer: Self-pay | Admitting: Physician Assistant

## 2016-12-11 ENCOUNTER — Other Ambulatory Visit: Payer: Self-pay | Admitting: Physician Assistant

## 2016-12-11 DIAGNOSIS — Z1231 Encounter for screening mammogram for malignant neoplasm of breast: Secondary | ICD-10-CM

## 2016-12-20 ENCOUNTER — Ambulatory Visit: Payer: Self-pay | Admitting: Physician Assistant

## 2016-12-20 ENCOUNTER — Encounter: Payer: Self-pay | Admitting: Physician Assistant

## 2016-12-20 VITALS — BP 98/70 | HR 79 | Temp 97.9°F | Ht 59.5 in | Wt 147.0 lb

## 2016-12-20 DIAGNOSIS — R7303 Prediabetes: Secondary | ICD-10-CM

## 2016-12-20 DIAGNOSIS — E785 Hyperlipidemia, unspecified: Secondary | ICD-10-CM

## 2016-12-20 DIAGNOSIS — J449 Chronic obstructive pulmonary disease, unspecified: Secondary | ICD-10-CM

## 2016-12-20 DIAGNOSIS — J441 Chronic obstructive pulmonary disease with (acute) exacerbation: Secondary | ICD-10-CM

## 2016-12-20 DIAGNOSIS — F17219 Nicotine dependence, cigarettes, with unspecified nicotine-induced disorders: Secondary | ICD-10-CM

## 2016-12-20 MED ORDER — PREDNISONE 20 MG PO TABS: 20.0000 mg | ORAL_TABLET | Freq: Two times a day (BID) | ORAL | 0 refills | Status: DC

## 2016-12-20 NOTE — Patient Instructions (Signed)
Fat and Cholesterol Restricted Diet High levels of fat and cholesterol in your blood may lead to various health problems, such as diseases of the heart, blood vessels, gallbladder, liver, and pancreas. Fats are concentrated sources of energy that come in various forms. Certain types of fat, including saturated fat, may be harmful in excess. Cholesterol is a substance needed by your body in small amounts. Your body makes all the cholesterol it needs. Excess cholesterol comes from the food you eat. When you have high levels of cholesterol and saturated fat in your blood, health problems can develop because the excess fat and cholesterol will gather along the walls of your blood vessels, causing them to narrow. Choosing the right foods will help you control your intake of fat and cholesterol. This will help keep the levels of these substances in your blood within normal limits and reduce your risk of disease. What is my plan? Your health care provider recommends that you:  Limit your fat intake to ______% or less of your total calories per day.  Limit the amount of cholesterol in your diet to less than _________mg per day.  Eat 20-30 grams of fiber each day.  What types of fat should I choose?  Choose healthy fats more often. Choose monounsaturated and polyunsaturated fats, such as olive and canola oil, flaxseeds, walnuts, almonds, and seeds.  Eat more omega-3 fats. Good choices include salmon, mackerel, sardines, tuna, flaxseed oil, and ground flaxseeds. Aim to eat fish at least two times a week.  Limit saturated fats. Saturated fats are primarily found in animal products, such as meats, butter, and cream. Plant sources of saturated fats include palm oil, palm kernel oil, and coconut oil.  Avoid foods with partially hydrogenated oils in them. These contain trans fats. Examples of foods that contain trans fats are stick margarine, some tub margarines, cookies, crackers, and other baked goods. What  general guidelines do I need to follow? These guidelines for healthy eating will help you control your intake of fat and cholesterol:  Check food labels carefully to identify foods with trans fats or high amounts of saturated fat.  Fill one half of your plate with vegetables and green salads.  Fill one fourth of your plate with whole grains. Look for the word "whole" as the first word in the ingredient list.  Fill one fourth of your plate with lean protein foods.  Limit fruit to two servings a day. Choose fruit instead of juice.  Eat more foods that contain fiber, such as apples, broccoli, carrots, beans, peas, and barley.  Eat more home-cooked food and less restaurant, buffet, and fast food.  Limit or avoid alcohol.  Limit foods high in starch and sugar.  Limit fried foods.  [place]eno foods using methods other than frying. Baking, boiling, grilling, and broiling are all great options.  Lose weight if you are overweight. Losing just 5-10% of your initial body weight can help your overall health and prevent diseases such as diabetes and heart disease.  What foods can I eat? Grains  Whole grains, such as whole wheat or whole grain breads, crackers, cereals, and pasta. Unsweetened oatmeal, bulgur, barley, quinoa, or brown rice. Corn or whole wheat flour tortillas. Vegetables  Fresh or frozen vegetables (raw, steamed, roasted, or grilled). Green salads. Fruits  All fresh, canned (in natural juice), or frozen fruits. Meats and other protein foods  Ground beef (85% or leaner), grass-fed beef, or beef trimmed of fat. Skinless chicken or turkey. Ground chicken or turkey.   Pork trimmed of fat. All fish and seafood. Eggs. Dried beans, peas, or lentils. Unsalted nuts or seeds. Unsalted canned or dry beans. Dairy  Low-fat dairy products, such as skim or 1% milk, 2% or reduced-fat cheeses, low-fat ricotta or cottage cheese, or plain low-fat yo Fats and oils  Tub margarines without trans  fats. Light or reduced-fat mayonnaise and salad dressings. Avocado. Olive, canola, sesame, or safflower oils. Natural peanut or almond butter (choose ones without added sugar and oil). The items listed above may not be a complete list of recommended foods or beverages. Contact your dietitian for more options. Foods to avoid Grains  White bread. White pasta. White rice. Cornbread. Bagels, pastries, and croissants. Crackers that contain trans fat. Vegetables  White potatoes. Corn. Creamed or fried vegetables. Vegetables in a cheese sauce. Fruits  Dried fruits. Canned fruit in light or heavy syrup. Fruit juice. Meats and other protein foods  Fatty cuts of meat. Ribs, chicken wings, bacon, sausage, bologna, salami, chitterlings, fatback, hot dogs, bratwurst, and packaged luncheon meats. Liver and organ meats. Dairy  Whole or 2% milk, cream, half-and-half, and cream cheese. Whole milk cheeses. Whole-fat or sweetened yogurt. Full-fat cheeses. Nondairy creamers and whipped toppings. Processed cheese, cheese spreads, or cheese curds. Beverages  Alcohol. Sweetened drinks (such as sodas, lemonade, and fruit drinks or punches). Fats and oils  Butter, stick margarine, lard, shortening, ghee, or bacon fat. Coconut, palm kernel, or palm oils. Sweets and desserts  Corn syrup, sugars, honey, and molasses. Candy. Jam and jelly. Syrup. Sweetened cereals. Cookies, pies, cakes, donuts, muffins, and ice cream. The items listed above may not be a complete list of foods and beverages to avoid. Contact your dietitian for more information. This information is not intended to replace advice given to you by your health care provider. Make sure you discuss any questions you have with your health care provider. Document Released: 01/08/2005 Document Revised: 01/29/2014 Document Reviewed: 04/08/2013 Elsevier Interactive Patient Education  2017 [employer]  

## 2016-12-20 NOTE — Progress Notes (Signed)
BP 98/70 (BP Location: Left Arm, Patient Position: Sitting, Cuff Size: Normal)   Pulse 79   Temp 97.9 F (36.6 C)   Ht 4' 11.5" (1.511 m)   Wt 147 lb (66.7 kg)   SpO2 97%   BMI 29.19 kg/m    Subjective:    Patient ID: Pamela Livingston, female    DOB: Sep 23, 1965, 51 y.o.   MRN: [id number]  HPI: Pamela Livingston is a 51 y.o. female presenting on 12/20/2016 for Cough (chest congestion, phlegm, green mucous, sneezing, wheezing. pt states it began this mornining)   HPI   Chief Complaint  Patient presents with  . Cough    chest congestion, phlegm, green mucous, sneezing, wheezing. pt states it began this mornining    Pt just had COPD exacerbation when seen here 11/6 and in the ER on 10/31.  She is still smoking.  No fevers.  Symptoms now onset this morning.  Pt scheduled for routine appt next week to review labs.  Relevant past medical, surgical, family and social history reviewed and updated as indicated. Interim medical history since our last visit reviewed. Allergies and medications reviewed and updated.  CURRENT MEDS: Albuterol MDI Omeprazole Tylenol benedryl singulair    Review of Systems  Constitutional: Positive for diaphoresis. Negative for appetite change, chills, fatigue, fever and unexpected weight change.  HENT: Positive for congestion, sneezing and sore throat. Negative for dental problem, drooling, ear pain, facial swelling, hearing loss, mouth sores, trouble swallowing and voice change.   Eyes: Negative for pain, discharge, redness, itching and visual disturbance.  Respiratory: Positive for cough and wheezing. Negative for choking and shortness of breath.   Cardiovascular: Negative for chest pain, palpitations and leg swelling.  Gastrointestinal: Negative for abdominal pain, blood in stool, constipation, diarrhea and vomiting.  Endocrine: Negative for cold intolerance, heat intolerance and polydipsia.  Genitourinary: Negative for decreased urine volume, dysuria and  hematuria.  Musculoskeletal: Negative for arthralgias, back pain and gait problem.  Skin: Negative for rash.  Allergic/Immunologic: Negative for environmental allergies.  Neurological: Negative for seizures, syncope, light-headedness and headaches.  Hematological: Negative for adenopathy.  Psychiatric/Behavioral: Negative for agitation, dysphoric mood and suicidal ideas. The patient is not nervous/anxious.     Per HPI unless specifically indicated above     Objective:    BP 98/70 (BP Location: Left Arm, Patient Position: Sitting, Cuff Size: Normal)   Pulse 79   Temp 97.9 F (36.6 C)   Ht 4' 11.5" (1.511 m)   Wt 147 lb (66.7 kg)   SpO2 97%   BMI 29.19 kg/m   Wt Readings from Last 3 Encounters:  12/20/16 147 lb (66.7 kg)  11/27/16 147 lb (66.7 kg)  11/21/16 154 lb (69.9 kg)    Physical Exam  Constitutional: She is oriented to person, place, and time. She appears well-developed and well-nourished.  HENT:  Head: Normocephalic and atraumatic.  Right Ear: Hearing, tympanic membrane, external ear and ear canal normal.  Left Ear: Hearing, tympanic membrane, external ear and ear canal normal.  Nose: Nose normal.  Mouth/Throat: Uvula is midline and oropharynx is clear and moist. No oropharyngeal exudate.  Neck: Neck supple.  Cardiovascular: Normal rate and regular rhythm.  Pulmonary/Chest: Effort normal and breath sounds normal. She has no wheezes.  Abdominal: Soft. Bowel sounds are normal. She exhibits no mass. There is no hepatosplenomegaly. There is no tenderness.  Musculoskeletal: She exhibits no edema.  Lymphadenopathy:    She has no cervical adenopathy.  Neurological: She is alert and  oriented to person, place, and time.  Skin: Skin is warm and dry.  Psychiatric: She has a normal mood and affect. Her behavior is normal.  Vitals reviewed.   Results for orders placed or performed during the hospital encounter of 11/28/16  HgB A1c  Result Value Ref Range   Hgb A1c MFr Bld  5.7 (H) 4.8 - 5.6 %   Mean Plasma Glucose 116.89 mg/dL  Comprehensive metabolic panel  Result Value Ref Range   Sodium 137 135 - 145 mmol/L   Potassium 4.0 3.5 - 5.1 mmol/L   Chloride 102 101 - 111 mmol/L   CO2 24 22 - 32 mmol/L   Glucose, Bld 112 (H) 65 - 99 mg/dL   BUN 19 6 - 20 mg/dL   Creatinine, Ser 0.80 0.44 - 1.00 mg/dL   Calcium 9.6 8.9 - 10.3 mg/dL   Total Protein 7.5 6.5 - 8.1 g/dL   Albumin 4.4 3.5 - 5.0 g/dL   AST 13 (L) 15 - 41 U/L   ALT 13 (L) 14 - 54 U/L   Alkaline Phosphatase 77 38 - 126 U/L   Total Bilirubin 0.7 0.3 - 1.2 mg/dL   GFR calc non Af Amer >60 >60 mL/min   GFR calc Af Amer >60 >60 mL/min   Anion gap 11 5 - 15  Lipid panel  Result Value Ref Range   Cholesterol 218 (H) 0 - 200 mg/dL   Triglycerides 131 <150 mg/dL   HDL 56 >40 mg/dL   Total CHOL/HDL Ratio 3.9 RATIO   VLDL 26 0 - 40 mg/dL   LDL Cholesterol 136 (H) 0 - 99 mg/dL  TSH  Result Value Ref Range   TSH 3.319 0.350 - 4.500 uIU/mL      Assessment & Plan:   Encounter Diagnoses  Name Primary?  Marland Kitchen COPD exacerbation ([place]) Yes  . Chronic obstructive pulmonary disease, unspecified COPD type ([place])   . Cigarette nicotine dependence with nicotine-induced disorder   . Hyperlipidemia, unspecified hyperlipidemia type   . Prediabetes     -reviewed labs with pt -counseled on smoking cessation -rx predisone and albuterol mdi for copd exacerabation -counseled on lowfat diet and exercise for lipids -pt to follow up in 3 months to recheck lipids,copd.  Pt to RTO sooner prn

## 2016-12-27 ENCOUNTER — Ambulatory Visit: Payer: Self-pay | Admitting: Physician Assistant

## 2017-02-14 ENCOUNTER — Telehealth: Payer: Self-pay | Admitting: Student

## 2017-02-14 NOTE — Telephone Encounter (Signed)
Pt called requesting prednisone and tessalon to be sent to her pharmacy. Pt c/o cough, congestion, and green mucous for 3 days. Pt states she had been taking benadryl and mucinex, but have not been helpful.  Pt was informed that PA would have to examine her before prescribing medications like prednisone. 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 Pt was advised to try delsym over the weekend for cough and to continue mucinex for congestion. Pt was also advised that quitting smoking would also help with her cough. Pt verbalized understanding.  Pt was offered to be scheduled to be seen on Monday, but pt prefers to wait the weekend to see if delsym helps. Pt was advised to call Monday morning if she would like to be scheduled. Pt verbalized understanding.

## 2017-03-05 ENCOUNTER — Other Ambulatory Visit: Payer: Self-pay | Admitting: Physician Assistant

## 2017-03-21 ENCOUNTER — Encounter: Payer: Self-pay | Admitting: Physician Assistant

## 2017-03-21 ENCOUNTER — Ambulatory Visit: Payer: Self-pay | Admitting: Physician Assistant

## 2017-03-21 VITALS — BP 121/80 | HR 79 | Temp 98.1°F | Wt 152.2 lb

## 2017-03-21 DIAGNOSIS — F17219 Nicotine dependence, cigarettes, with unspecified nicotine-induced disorders: Secondary | ICD-10-CM

## 2017-03-21 DIAGNOSIS — J449 Chronic obstructive pulmonary disease, unspecified: Secondary | ICD-10-CM

## 2017-03-21 DIAGNOSIS — Z1239 Encounter for other screening for malignant neoplasm of breast: Secondary | ICD-10-CM

## 2017-03-21 DIAGNOSIS — E785 Hyperlipidemia, unspecified: Secondary | ICD-10-CM

## 2017-03-21 MED ORDER — MOMETASONE FURO-FORMOTEROL FUM 100-5 MCG/ACT IN AERO: 2.0000 | INHALATION_SPRAY | Freq: Two times a day (BID) | RESPIRATORY_TRACT | 2 refills | Status: DC

## 2017-03-21 NOTE — Progress Notes (Signed)
BP 121/80 (BP Location: Right Arm, Patient Position: Sitting, Cuff Size: Normal)   Pulse 79   Temp 98.1 F (36.7 C)   Wt 152 lb 4 oz (69.1 kg)   SpO2 100%   BMI 30.24 kg/m    Subjective:    Patient ID: Pamela Livingston, female    DOB: May 20, 1965, 52 y.o.   MRN: [id number]  HPI: Pamela Livingston is a 52 y.o. female presenting on 03/21/2017 for COPD and Hyperlipidemia   HPI   Pt complains of cough and congestion.  She is still smoking.    She says she has been stressed lately (mostly due to daughter with substance abuse issues).   She has good support- she has a supportive boyfriend.   Pt didn't make it to her scheduled mammogram due to weather, she says  Relevant past medical, surgical, family and social history reviewed and updated as indicated. Interim medical history since our last visit reviewed. Allergies and medications reviewed and updated.   Current Outpatient Medications:  .  acetaminophen (TYLENOL) 500 MG tablet, Take 2,000 mg by mouth every 8 (eight) hours as needed for headache., Disp: , Rfl:  .  albuterol (PROVENTIL HFA;VENTOLIN HFA) 108 (90 Base) MCG/ACT inhaler, Inhale 2 puffs every 6 (six) hours as needed into the lungs for wheezing or shortness of breath., Disp: 3 Inhaler, Rfl: 1 .  diphenhydrAMINE (BENADRYL) 25 MG tablet, Take 50 mg by mouth at bedtime. , Disp: , Rfl:  .  montelukast (SINGULAIR) 10 MG tablet, TAKE 1 Tablet BY MOUTH ONCE DAILY AS NEEDED (Patient taking differently: TAKE 1 Tablet BY MOUTH ONCE DAILY), Disp: 90 tablet, Rfl: 0 .  omeprazole (PRILOSEC) 40 MG capsule, Take 1 capsule (40 mg total) daily by mouth., Disp: 90 capsule, Rfl: 1 .  Pseudoeph-Doxylamine-DM-APAP (NYQUIL PO), Take 2 tablets by mouth at bedtime., Disp: , Rfl:    Review of Systems  Constitutional: Negative for appetite change, chills, diaphoresis, fatigue, fever and unexpected weight change.  HENT: Negative for congestion, dental problem, drooling, ear pain, facial swelling, hearing loss,  mouth sores, sneezing, sore throat, trouble swallowing and voice change.   Eyes: Negative for pain, discharge, redness, itching and visual disturbance.  Respiratory: Positive for cough, shortness of breath and wheezing. Negative for choking.   Cardiovascular: Negative for chest pain, palpitations and leg swelling.  Gastrointestinal: Negative for abdominal pain, blood in stool, constipation, diarrhea and vomiting.  Endocrine: Negative for cold intolerance, heat intolerance and polydipsia.  Genitourinary: Negative for decreased urine volume, dysuria and hematuria.  Musculoskeletal: Negative for arthralgias, back pain and gait problem.  Skin: Negative for rash.  Allergic/Immunologic: Negative for environmental allergies.  Neurological: Negative for seizures, syncope, light-headedness and headaches.  Hematological: Negative for adenopathy.  Psychiatric/Behavioral: Negative for agitation, dysphoric mood and suicidal ideas. The patient is not nervous/anxious.     Per HPI unless specifically indicated above     Objective:    BP 121/80 (BP Location: Right Arm, Patient Position: Sitting, Cuff Size: Normal)   Pulse 79   Temp 98.1 F (36.7 C)   Wt 152 lb 4 oz (69.1 kg)   SpO2 100%   BMI 30.24 kg/m   Wt Readings from Last 3 Encounters:  03/21/17 152 lb 4 oz (69.1 kg)  12/20/16 147 lb (66.7 kg)  11/27/16 147 lb (66.7 kg)    Physical Exam  Constitutional: She is oriented to person, place, and time. She appears well-developed and well-nourished.  HENT:  Head: Normocephalic and atraumatic.  Neck: Neck  supple.  Cardiovascular: Normal rate and regular rhythm.  Pulmonary/Chest: Effort normal. No respiratory distress. She has wheezes. She has no rhonchi. She has no rales.  Abdominal: Soft. Bowel sounds are normal. She exhibits no mass. There is no hepatosplenomegaly. There is no tenderness.  Musculoskeletal: She exhibits no edema.  Lymphadenopathy:    She has no cervical adenopathy.   Neurological: She is alert and oriented to person, place, and time.  Skin: Skin is warm and dry.  Psychiatric: She has a normal mood and affect. Her behavior is normal.  Vitals reviewed.       Assessment & Plan:   Encounter Diagnoses  Name Primary?  . Chronic obstructive pulmonary disease, unspecified COPD type ([place]) Yes  . Cigarette nicotine dependence with nicotine-induced disorder   . Hyperlipidemia, unspecified hyperlipidemia type   . Screening for breast cancer      -will Recheck lipids. Will call pt with results -counseled Smoking cessation -Add dulera.  continue to use albuterol prn -reorder screening mammogram -pt to follow up 3 months.  RTO sooner prn

## 2017-04-10 ENCOUNTER — Other Ambulatory Visit: Payer: Self-pay | Admitting: Physician Assistant

## 2017-04-10 DIAGNOSIS — Z1231 Encounter for screening mammogram for malignant neoplasm of breast: Secondary | ICD-10-CM

## 2017-05-02 ENCOUNTER — Encounter: Payer: Self-pay | Admitting: Physician Assistant

## 2017-05-02 ENCOUNTER — Ambulatory Visit: Payer: Self-pay | Admitting: Physician Assistant

## 2017-05-02 ENCOUNTER — Ambulatory Visit (HOSPITAL_COMMUNITY)
Admission: RE | Admit: 2017-05-02 | Discharge: 2017-05-02 | Disposition: A | Discharge status: Home / Self Care | Payer: Self-pay | Source: Ambulatory Visit | Attending: Physician Assistant | Admitting: Physician Assistant

## 2017-05-02 VITALS — BP 120/82 | HR 93 | Temp 98.4°F | Ht 59.5 in | Wt 154.5 lb

## 2017-05-02 DIAGNOSIS — E785 Hyperlipidemia, unspecified: Secondary | ICD-10-CM

## 2017-05-02 DIAGNOSIS — F17219 Nicotine dependence, cigarettes, with unspecified nicotine-induced disorders: Secondary | ICD-10-CM

## 2017-05-02 DIAGNOSIS — R079 Chest pain, unspecified: Secondary | ICD-10-CM

## 2017-05-02 DIAGNOSIS — J449 Chronic obstructive pulmonary disease, unspecified: Secondary | ICD-10-CM | POA: Insufficient documentation

## 2017-05-02 DIAGNOSIS — J441 Chronic obstructive pulmonary disease with (acute) exacerbation: Secondary | ICD-10-CM

## 2017-05-02 MED ORDER — AZITHROMYCIN 250 MG PO TABS: ORAL_TABLET | ORAL | 0 refills | Status: DC

## 2017-05-02 MED ORDER — PREDNISONE 10 MG PO TABS: ORAL_TABLET | ORAL | 0 refills | Status: DC

## 2017-05-02 NOTE — Progress Notes (Signed)
BP 120/82 (BP Location: Left Arm, Patient Position: Sitting, Cuff Size: Normal)   Pulse 93   Temp 98.4 F (36.9 C)   Ht 4' 11.5" (1.511 m)   Wt 154 lb 8 oz (70.1 kg)   SpO2 98%   BMI 30.68 kg/m    Subjective:    Patient ID: Pamela Livingston, female    DOB: 04/14/1965, 52 y.o.   MRN: [id number]  HPI: Pamela Livingston is a 52 y.o. female presenting on 05/02/2017 for COPD; Chest Pain (for a couple of weeks. pt states it is hurting right now. pt states she feels SOB. pt states she has taken tylenol and has not helped); and Foot Pain (left foot. pt states she injured it one month ago. pt states it is swollen. pt states it makes her leg hurt and sometimes makes it difficult to walk.)   HPI   Chief Complaint  Patient presents with  . COPD  . Chest Pain    for a couple of weeks. pt states it is hurting right now. pt states she feels SOB. pt states she has taken tylenol and has not helped  . Foot Pain    left foot. pt states she injured it one month ago. pt states it is swollen. pt states it makes her leg hurt and sometimes makes it difficult to walk.     Pt says her chest hurts all the time.  It does not come and go.  This started a couple weeks ago.  She also feels drained and like she has no energy.  No fevers.  She is also coughing for a couple weeks.  She also c/o heartburn even though she is taking her omeprazole.    Pt didn't get lipids labs drawn  Pt is still smoking  Relevant past medical, surgical, family and social history reviewed and updated as indicated. Interim medical history since our last visit reviewed. Allergies and medications reviewed and updated.   Current Outpatient Medications:  .  acetaminophen (TYLENOL) 500 MG tablet, Take 2,000 mg by mouth every 8 (eight) hours as needed for headache., Disp: , Rfl:  .  albuterol (PROVENTIL HFA;VENTOLIN HFA) 108 (90 Base) MCG/ACT inhaler, Inhale 2 puffs every 6 (six) hours as needed into the lungs for wheezing or shortness of breath.,  Disp: 3 Inhaler, Rfl: 1 .  diphenhydrAMINE (BENADRYL) 25 MG tablet, Take 50 mg by mouth at bedtime. , Disp: , Rfl:  .  mometasone-formoterol (DULERA) 100-5 MCG/ACT AERO, Inhale 2 puffs into the lungs 2 (two) times daily., Disp: 3 Inhaler, Rfl: 2 .  montelukast (SINGULAIR) 10 MG tablet, TAKE 1 Tablet BY MOUTH ONCE DAILY AS NEEDED (Patient taking differently: TAKE 1 Tablet BY MOUTH ONCE DAILY), Disp: 90 tablet, Rfl: 0 .  omeprazole (PRILOSEC) 40 MG capsule, Take 1 capsule (40 mg total) daily by mouth., Disp: 90 capsule, Rfl: 1   Review of Systems  Constitutional: Negative for appetite change, chills, diaphoresis, fatigue, fever and unexpected weight change.  HENT: Positive for sneezing. Negative for congestion, drooling, ear pain, facial swelling, hearing loss, mouth sores, sore throat, trouble swallowing and voice change.   Eyes: Negative for pain, discharge, redness, itching and visual disturbance.  Respiratory: Positive for cough, shortness of breath and wheezing. Negative for choking.   Cardiovascular: Positive for chest pain and leg swelling. Negative for palpitations.  Gastrointestinal: Negative for abdominal pain, blood in stool, constipation, diarrhea and vomiting.  Endocrine: Negative for cold intolerance, heat intolerance and polydipsia.  Genitourinary: Negative for  decreased urine volume, dysuria and hematuria.  Musculoskeletal: Positive for back pain. Negative for arthralgias and gait problem.  Skin: Negative for rash.  Allergic/Immunologic: Negative for environmental allergies.  Neurological: Negative for seizures, syncope, light-headedness and headaches.  Hematological: Negative for adenopathy.  Psychiatric/Behavioral: Negative for agitation, dysphoric mood and suicidal ideas. The patient is not nervous/anxious.     Per HPI unless specifically indicated above     Objective:    BP 120/82 (BP Location: Left Arm, Patient Position: Sitting, Cuff Size: Normal)   Pulse 93   Temp  98.4 F (36.9 C)   Ht 4' 11.5" (1.511 m)   Wt 154 lb 8 oz (70.1 kg)   SpO2 98%   BMI 30.68 kg/m   Wt Readings from Last 3 Encounters:  05/02/17 154 lb 8 oz (70.1 kg)  03/21/17 152 lb 4 oz (69.1 kg)  12/20/16 147 lb (66.7 kg)    Physical Exam  Constitutional: She is oriented to person, place, and time. She appears well-developed and well-nourished.  HENT:  Head: Normocephalic and atraumatic.  Right Ear: Hearing, tympanic membrane, external ear and ear canal normal.  Left Ear: Hearing, tympanic membrane, external ear and ear canal normal.  Nose: Nose normal.  Mouth/Throat: Uvula is midline and oropharynx is clear and moist. No oropharyngeal exudate.  Neck: Neck supple.  Cardiovascular: Normal rate and regular rhythm.  Pulmonary/Chest: Effort normal. No accessory muscle usage. No tachypnea. She has wheezes. She has no rhonchi. She has no rales.  Abdominal: Soft. Bowel sounds are normal. She exhibits no mass. There is no hepatosplenomegaly. There is no tenderness.  Musculoskeletal: She exhibits no edema.  Lymphadenopathy:    She has no cervical adenopathy.  Neurological: She is alert and oriented to person, place, and time.  Skin: Skin is warm and dry.  Psychiatric: She has a normal mood and affect. Her behavior is normal.  Vitals reviewed.  EKG- NSR at 66 bpm. no changes compared with EKG done 11/22/2016     Assessment & Plan:   Encounter Diagnoses  Name Primary?  . Chronic obstructive pulmonary disease, unspecified COPD type ([place]) Yes  . Chest pain, unspecified type   . Hyperlipidemia, unspecified hyperlipidemia type   . COPD exacerbation ([place])   . Cigarette nicotine dependence with nicotine-induced disorder      -pt to get cxr done when leaves office today -she will get Labs tomorrow morning while fasting -rx zpack and prednisone taper -counseled to avoid smoking -pt to follow up 2 weeks for recheck.  RTO sooner prn

## 2017-05-21 ENCOUNTER — Ambulatory Visit: Payer: Self-pay | Admitting: Physician Assistant

## 2017-05-21 ENCOUNTER — Encounter: Payer: Self-pay | Admitting: Physician Assistant

## 2017-05-21 VITALS — BP 106/70 | HR 80 | Temp 98.1°F | Ht 59.5 in | Wt 157.2 lb

## 2017-05-21 DIAGNOSIS — F17219 Nicotine dependence, cigarettes, with unspecified nicotine-induced disorders: Secondary | ICD-10-CM

## 2017-05-21 DIAGNOSIS — R0689 Other abnormalities of breathing: Secondary | ICD-10-CM

## 2017-05-21 DIAGNOSIS — R079 Chest pain, unspecified: Secondary | ICD-10-CM

## 2017-05-21 DIAGNOSIS — J449 Chronic obstructive pulmonary disease, unspecified: Secondary | ICD-10-CM

## 2017-05-21 DIAGNOSIS — R0602 Shortness of breath: Secondary | ICD-10-CM

## 2017-05-21 DIAGNOSIS — R0609 Other forms of dyspnea: Secondary | ICD-10-CM

## 2017-05-21 NOTE — Progress Notes (Signed)
BP 106/70 (BP Location: Left Arm, Patient Position: Sitting, Cuff Size: Normal)   Pulse 80   Temp 98.1 F (36.7 C)   Ht 4' 11.5" (1.511 m)   Wt 157 lb 4 oz (71.3 kg)   SpO2 97%   BMI 31.23 kg/m    Subjective:    Patient ID: Pamela Livingston, female    DOB: 1965/12/15, 52 y.o.   MRN: [id number]  HPI: Pamela Livingston is a 52 y.o. female presenting on 05/21/2017 for Follow-up   HPI   Pt states breating improved from last OV.  Still with some congestion from the pollen.  She is still smoking.   She is still having chest pain.  The pain is more on the Left.  She says she gets more trouble with her breathing on exertion.     Pt says she is playing phone tag with the mammogram scheduler  She did not get her labs drawn after her last OV as counseled.   Relevant past medical, surgical, family and social history reviewed and updated as indicated. Interim medical history since our last visit reviewed. Allergies and medications reviewed and updated.   Current Outpatient Medications:  .  albuterol (PROVENTIL HFA;VENTOLIN HFA) 108 (90 Base) MCG/ACT inhaler, Inhale 2 puffs every 6 (six) hours as needed into the lungs for wheezing or shortness of breath., Disp: 3 Inhaler, Rfl: 1 .  diphenhydrAMINE (BENADRYL) 25 MG tablet, Take 50 mg by mouth at bedtime. , Disp: , Rfl:  .  mometasone-formoterol (DULERA) 100-5 MCG/ACT AERO, Inhale 2 puffs into the lungs 2 (two) times daily., Disp: 3 Inhaler, Rfl: 2 .  montelukast (SINGULAIR) 10 MG tablet, TAKE 1 Tablet BY MOUTH ONCE DAILY AS NEEDED (Patient taking differently: TAKE 1 Tablet BY MOUTH ONCE DAILY), Disp: 90 tablet, Rfl: 0 .  omeprazole (PRILOSEC) 40 MG capsule, Take 1 capsule (40 mg total) daily by mouth., Disp: 90 capsule, Rfl: 1   Review of Systems  Constitutional: Negative for appetite change, chills, diaphoresis, fatigue, fever and unexpected weight change.  HENT: Positive for congestion. Negative for dental problem, drooling, ear pain, facial swelling,  hearing loss, mouth sores, sneezing, sore throat, trouble swallowing and voice change.   Eyes: Negative for pain, discharge, redness, itching and visual disturbance.  Respiratory: Positive for cough, shortness of breath and wheezing. Negative for choking.   Cardiovascular: Positive for chest pain. Negative for palpitations and leg swelling.  Gastrointestinal: Negative for abdominal pain, blood in stool, constipation, diarrhea and vomiting.  Endocrine: Negative for cold intolerance, heat intolerance and polydipsia.  Genitourinary: Negative for decreased urine volume, dysuria and hematuria.  Musculoskeletal: Negative for arthralgias, back pain and gait problem.  Skin: Negative for rash.  Allergic/Immunologic: Negative for environmental allergies.  Neurological: Negative for seizures, syncope, light-headedness and headaches.  Hematological: Negative for adenopathy.  Psychiatric/Behavioral: Negative for agitation, dysphoric mood and suicidal ideas. The patient is not nervous/anxious.     Per HPI unless specifically indicated above     Objective:    BP 106/70 (BP Location: Left Arm, Patient Position: Sitting, Cuff Size: Normal)   Pulse 80   Temp 98.1 F (36.7 C)   Ht 4' 11.5" (1.511 m)   Wt 157 lb 4 oz (71.3 kg)   SpO2 97%   BMI 31.23 kg/m   Wt Readings from Last 3 Encounters:  05/21/17 157 lb 4 oz (71.3 kg)  05/02/17 154 lb 8 oz (70.1 kg)  03/21/17 152 lb 4 oz (69.1 kg)    Physical Exam  Constitutional: She is oriented to person, place, and time. She appears well-developed and well-nourished.  HENT:  Head: Normocephalic and atraumatic.  Neck: Neck supple.  Cardiovascular: Normal rate and regular rhythm.  Pulmonary/Chest: Effort normal. No respiratory distress. She has decreased breath sounds in the left lower field.  Abdominal: Soft. Bowel sounds are normal. She exhibits no mass. There is no hepatosplenomegaly. There is no tenderness.  Musculoskeletal: She exhibits no edema.   Lymphadenopathy:    She has no cervical adenopathy.  Neurological: She is alert and oriented to person, place, and time.  Skin: Skin is warm and dry.  Psychiatric: She has a normal mood and affect. Her behavior is normal.  Vitals reviewed.       Assessment & Plan:   Encounter Diagnoses  Name Primary?  . Chronic obstructive pulmonary disease, unspecified COPD type ([place]) Yes  . Chest pain, unspecified type   . Abnormal breath sounds   . Cigarette nicotine dependence with nicotine-induced disorder   . DOE (dyspnea on exertion)   . Shortness of breath      -Reviewed CXR results with pt -Pt counseled to call back mammogram scheduler -Pt to get fasting labs drawn -Counseled smoking cessation -ordered CT to evaluate chest pain with abnormal breath sounds in longterm heavy smoker with sob -pt to follow up after scan.  RTO sooner prn

## 2017-06-06 ENCOUNTER — Ambulatory Visit (HOSPITAL_COMMUNITY)
Admission: RE | Admit: 2017-06-06 | Discharge: 2017-06-06 | Disposition: A | Discharge status: Home / Self Care | Payer: Self-pay | Source: Ambulatory Visit | Attending: Physician Assistant | Admitting: Physician Assistant

## 2017-06-06 DIAGNOSIS — R0609 Other forms of dyspnea: Secondary | ICD-10-CM | POA: Insufficient documentation

## 2017-06-06 DIAGNOSIS — I700 Atherosclerosis of aorta: Secondary | ICD-10-CM | POA: Insufficient documentation

## 2017-06-06 DIAGNOSIS — I2510 Atherosclerotic heart disease of native coronary artery without angina pectoris: Secondary | ICD-10-CM | POA: Insufficient documentation

## 2017-06-06 DIAGNOSIS — R918 Other nonspecific abnormal finding of lung field: Secondary | ICD-10-CM | POA: Insufficient documentation

## 2017-06-06 DIAGNOSIS — R0689 Other abnormalities of breathing: Secondary | ICD-10-CM | POA: Insufficient documentation

## 2017-06-06 DIAGNOSIS — R079 Chest pain, unspecified: Secondary | ICD-10-CM | POA: Insufficient documentation

## 2017-06-06 DIAGNOSIS — F17219 Nicotine dependence, cigarettes, with unspecified nicotine-induced disorders: Secondary | ICD-10-CM | POA: Insufficient documentation

## 2017-06-06 DIAGNOSIS — R0602 Shortness of breath: Secondary | ICD-10-CM | POA: Insufficient documentation

## 2017-06-06 DIAGNOSIS — J9811 Atelectasis: Secondary | ICD-10-CM | POA: Insufficient documentation

## 2017-06-06 MED ORDER — IOPAMIDOL (ISOVUE-300) INJECTION 61%
75.0000 mL | Freq: Once | INTRAVENOUS | Status: AC | PRN
  Administered 2017-06-06: 75 mL via INTRAVENOUS

## 2017-06-07 ENCOUNTER — Other Ambulatory Visit: Payer: Self-pay | Admitting: Physician Assistant

## 2017-06-10 ENCOUNTER — Other Ambulatory Visit: Payer: Self-pay | Admitting: Physician Assistant

## 2017-06-11 ENCOUNTER — Ambulatory Visit: Payer: Self-pay | Admitting: Physician Assistant

## 2017-06-11 ENCOUNTER — Encounter: Payer: Self-pay | Admitting: Physician Assistant

## 2017-06-11 VITALS — BP 117/78 | HR 71 | Temp 98.1°F | Ht 59.5 in | Wt 159.2 lb

## 2017-06-11 DIAGNOSIS — F17219 Nicotine dependence, cigarettes, with unspecified nicotine-induced disorders: Secondary | ICD-10-CM

## 2017-06-11 DIAGNOSIS — R059 Cough, unspecified: Secondary | ICD-10-CM

## 2017-06-11 DIAGNOSIS — R05 Cough: Secondary | ICD-10-CM

## 2017-06-11 DIAGNOSIS — Z1239 Encounter for other screening for malignant neoplasm of breast: Secondary | ICD-10-CM

## 2017-06-11 DIAGNOSIS — Z9119 Patient's noncompliance with other medical treatment and regimen: Secondary | ICD-10-CM

## 2017-06-11 DIAGNOSIS — R9389 Abnormal findings on diagnostic imaging of other specified body structures: Secondary | ICD-10-CM

## 2017-06-11 DIAGNOSIS — Z91199 Patient's noncompliance with other medical treatment and regimen due to unspecified reason: Secondary | ICD-10-CM

## 2017-06-11 DIAGNOSIS — J449 Chronic obstructive pulmonary disease, unspecified: Secondary | ICD-10-CM

## 2017-06-11 MED ORDER — AZITHROMYCIN 250 MG PO TABS: ORAL_TABLET | ORAL | 0 refills | Status: AC

## 2017-06-11 NOTE — Progress Notes (Signed)
BP 117/78 (BP Location: Right Arm, Patient Position: Sitting, Cuff Size: Normal)   Pulse 71   Temp 98.1 F (36.7 C)   Ht 4' 11.5" (1.511 m)   Wt 159 lb 4 oz (72.2 kg)   SpO2 100%   BMI 31.63 kg/m    Subjective:    Patient ID: Pamela Livingston, female    DOB: [birth year], 52 y.o.   MRN: [id number]  HPI: Pamela Livingston is a 52 y.o. female presenting on 06/11/2017 for Follow-up   HPI Pt say s she has tried calling the mammogram place but hasn't been able to get them to answer.  Pt has still not gotten labs drawn  Pt says still coughing, worse in the moring and duiring the night.  It is better during the day.   Pt quit her part-time job because "he was stressing me out"  Relevant past medical, surgical, family and social history reviewed and updated as indicated. Interim medical history since our last visit reviewed. Allergies and medications reviewed and updated.   Current Outpatient Medications:  .  diphenhydrAMINE (BENADRYL) 25 MG tablet, Take 50 mg by mouth at bedtime. , Disp: , Rfl:  .  mometasone-formoterol (DULERA) 100-5 MCG/ACT AERO, Inhale 2 puffs into the lungs 2 (two) times daily., Disp: 3 Inhaler, Rfl: 2 .  montelukast (SINGULAIR) 10 MG tablet, TAKE 1 Tablet BY MOUTH ONCE DAILY AS NEEDED, Disp: 90 tablet, Rfl: 0 .  omeprazole (PRILOSEC) 40 MG capsule, TAKE 1 Capsule BY MOUTH ONCE DAILY, Disp: 90 capsule, Rfl: 1 .  PROVENTIL HFA 108 (90 Base) MCG/ACT inhaler, INHALE 2 PUFFS BY MOUTH EVERY 6 HOURS AS NEEDED FOR COUGHING, WHEEZING, OR SHORTNESS OF BREATH, Disp: 3 Inhaler, Rfl: 1   Review of Systems  Constitutional: Negative for appetite change, chills, diaphoresis, fatigue, fever and unexpected weight change.  HENT: Positive for congestion and sneezing. Negative for dental problem, drooling, ear pain, facial swelling, hearing loss, mouth sores, sore throat, trouble swallowing and voice change.   Eyes: Negative for pain, discharge, redness, itching and visual disturbance.   Respiratory: Positive for cough, shortness of breath and wheezing. Negative for choking.   Cardiovascular: Positive for chest pain. Negative for palpitations and leg swelling.  Gastrointestinal: Negative for abdominal pain, blood in stool, constipation, diarrhea and vomiting.  Endocrine: Negative for cold intolerance, heat intolerance and polydipsia.  Genitourinary: Negative for decreased urine volume, dysuria and hematuria.  Musculoskeletal: Negative for arthralgias, back pain and gait problem.  Skin: Negative for rash.  Allergic/Immunologic: Negative for environmental allergies.  Neurological: Negative for seizures, syncope, light-headedness and headaches.  Hematological: Negative for adenopathy.  Psychiatric/Behavioral: Negative for agitation, dysphoric mood and suicidal ideas. The patient is not nervous/anxious.     Per HPI unless specifically indicated above     Objective:    BP 117/78 (BP Location: Right Arm, Patient Position: Sitting, Cuff Size: Normal)   Pulse 71   Temp 98.1 F (36.7 C)   Ht 4' 11.5" (1.511 m)   Wt 159 lb 4 oz (72.2 kg)   SpO2 100%   BMI 31.63 kg/m   Wt Readings from Last 3 Encounters:  06/11/17 159 lb 4 oz (72.2 kg)  05/21/17 157 lb 4 oz (71.3 kg)  05/02/17 154 lb 8 oz (70.1 kg)    Physical Exam  Constitutional: She is oriented to person, place, and time. She appears well-developed and well-nourished.  HENT:  Head: Normocephalic and atraumatic.  Neck: Neck supple.  Cardiovascular: Normal rate and regular rhythm.  Pulmonary/Chest: Effort normal and breath sounds normal.  Abdominal: Soft. Bowel sounds are normal. She exhibits no mass. There is no hepatosplenomegaly. There is no tenderness.  Musculoskeletal: She exhibits no edema.  Lymphadenopathy:    She has no cervical adenopathy.  Neurological: She is alert and oriented to person, place, and time.  Skin: Skin is warm and dry.  Psychiatric: She has a normal mood and affect. Her behavior is  normal.  Vitals reviewed.       Assessment & Plan:   Encounter Diagnoses  Name Primary?  . Chronic obstructive pulmonary disease, unspecified COPD type ([place]) Yes  . Cough   . Abnormal CT of the chest   . Cigarette nicotine dependence with nicotine-induced disorder   . Personal history of noncompliance with medical treatment, presenting hazards to health   . Screening for breast cancer      -Reviewed CT results with pt- will repeat in 3 months -will treat with antibiotics in light of persistent cough and results CT -Reordered mammogram -Urged pt to get labs darwn -counseled smoking cessation -F/u 3 months (after repeat CT).  RTO sooner prn

## 2017-06-19 ENCOUNTER — Other Ambulatory Visit (HOSPITAL_COMMUNITY)
Admission: RE | Admit: 2017-06-19 | Discharge: 2017-06-19 | Disposition: A | Discharge status: Home / Self Care | Payer: Self-pay | Source: Ambulatory Visit | Attending: Physician Assistant | Admitting: Physician Assistant

## 2017-06-19 DIAGNOSIS — R079 Chest pain, unspecified: Secondary | ICD-10-CM | POA: Insufficient documentation

## 2017-06-19 DIAGNOSIS — E785 Hyperlipidemia, unspecified: Secondary | ICD-10-CM | POA: Insufficient documentation

## 2017-06-19 LAB — COMPREHENSIVE METABOLIC PANEL
ALBUMIN: 4.1 g/dL (ref 3.5–5.0)
ALT: 20 U/L (ref 14–54)
AST: 16 U/L (ref 15–41)
Alkaline Phosphatase: 81 U/L (ref 38–126)
Anion gap: 6 (ref 5–15)
BILIRUBIN TOTAL: 0.7 mg/dL (ref 0.3–1.2)
BUN: 19 mg/dL (ref 6–20)
CO2: 27 mmol/L (ref 22–32)
Calcium: 9.4 mg/dL (ref 8.9–10.3)
Chloride: 106 mmol/L (ref 101–111)
Creatinine, Ser: 0.77 mg/dL (ref 0.44–1.00)
GLUCOSE: 87 mg/dL (ref 65–99)
POTASSIUM: 4.6 mmol/L (ref 3.5–5.1)
Sodium: 139 mmol/L (ref 135–145)
TOTAL PROTEIN: 7.3 g/dL (ref 6.5–8.1)

## 2017-06-19 LAB — LIPID PANEL
CHOL/HDL RATIO: 4.4 ratio
CHOLESTEROL: 221 mg/dL — AB (ref 0–200)
HDL: 50 mg/dL (ref >40)
LDL Cholesterol: 154 mg/dL — ABNORMAL HIGH (ref 0–99)
Triglycerides: 87 mg/dL (ref <150)
VLDL: 17 mg/dL (ref 0–40)

## 2017-06-19 LAB — CBC WITH DIFFERENTIAL/PLATELET
Basophils Absolute: 0 10*3/uL (ref 0.0–0.1)
Basophils Relative: 1 %
EOS ABS: 0.5 10*3/uL (ref 0.0–0.7)
Eosinophils Relative: 7 %
HCT: 40.2 % (ref 36.0–46.0)
HEMOGLOBIN: 13.3 g/dL (ref 12.0–15.0)
LYMPHS ABS: 2.8 10*3/uL (ref 0.7–4.0)
LYMPHS PCT: 38 %
MCH: 31.9 pg (ref 26.0–34.0)
MCHC: 33.1 g/dL (ref 30.0–36.0)
MCV: 96.4 fL (ref 78.0–100.0)
Monocytes Absolute: 0.5 10*3/uL (ref 0.1–1.0)
Monocytes Relative: 7 %
NEUTROS PCT: 47 %
Neutro Abs: 3.5 10*3/uL (ref 1.7–7.7)
Platelets: 274 10*3/uL (ref 150–400)
RBC: 4.17 MIL/uL (ref 3.87–5.11)
RDW: 13.2 % (ref 11.5–15.5)
WBC: 7.3 10*3/uL (ref 4.0–10.5)

## 2017-06-20 ENCOUNTER — Other Ambulatory Visit: Payer: Self-pay | Admitting: Physician Assistant

## 2017-06-20 MED ORDER — ATORVASTATIN CALCIUM 20 MG PO TABS: 20.0000 mg | ORAL_TABLET | Freq: Every day | ORAL | 1 refills | Status: DC

## 2017-07-05 ENCOUNTER — Ambulatory Visit
Admission: RE | Admit: 2017-07-05 | Discharge: 2017-07-05 | Disposition: A | Discharge status: Home / Self Care | Payer: Self-pay | Source: Ambulatory Visit | Attending: Physician Assistant | Admitting: Physician Assistant

## 2017-07-05 DIAGNOSIS — Z1231 Encounter for screening mammogram for malignant neoplasm of breast: Secondary | ICD-10-CM

## 2017-07-10 ENCOUNTER — Ambulatory Visit: Payer: Self-pay | Admitting: Physician Assistant

## 2017-07-10 ENCOUNTER — Encounter: Payer: Self-pay | Admitting: Physician Assistant

## 2017-07-10 VITALS — BP 155/99 | HR 93 | Temp 97.4°F | Ht 59.5 in | Wt 164.5 lb

## 2017-07-10 DIAGNOSIS — R062 Wheezing: Secondary | ICD-10-CM

## 2017-07-10 DIAGNOSIS — G4700 Insomnia, unspecified: Secondary | ICD-10-CM

## 2017-07-10 DIAGNOSIS — F4321 Adjustment disorder with depressed mood: Secondary | ICD-10-CM

## 2017-07-10 DIAGNOSIS — J441 Chronic obstructive pulmonary disease with (acute) exacerbation: Secondary | ICD-10-CM

## 2017-07-10 MED ORDER — TEMAZEPAM 15 MG PO CAPS: 15.0000 mg | ORAL_CAPSULE | Freq: Every evening | ORAL | 0 refills | Status: DC | PRN

## 2017-07-10 MED ORDER — ALBUTEROL SULFATE (2.5 MG/3ML) 0.083% IN NEBU: 2.5000 mg | INHALATION_SOLUTION | Freq: Four times a day (QID) | RESPIRATORY_TRACT | 1 refills | Status: DC | PRN

## 2017-07-10 MED ORDER — ALBUTEROL SULFATE (2.5 MG/3ML) 0.083% IN NEBU
2.5000 mg | INHALATION_SOLUTION | Freq: Once | RESPIRATORY_TRACT | Status: AC
  Administered 2017-07-10: 2.5 mg via RESPIRATORY_TRACT

## 2017-07-10 MED ORDER — PREDNISONE 20 MG PO TABS: 40.0000 mg | ORAL_TABLET | Freq: Every day | ORAL | 0 refills | Status: DC

## 2017-07-10 NOTE — Progress Notes (Signed)
BP (!) 155/99 (BP Location: Right Arm, Patient Position: Sitting, Cuff Size: Normal)   Pulse 93   Temp (!) 97.4 F (36.3 C) (Oral)   Ht 4' 11.5" (1.511 m)   Wt 164 lb 8 oz (74.6 kg)   SpO2 97%   BMI 32.67 kg/m    Subjective:    Patient ID: Pamela Livingston, female    DOB: [birth year], 52 y.o.   MRN: [id number]  HPI: Pamela Livingston is a 52 y.o. female presenting on 07/10/2017 for Cough ("very sad due to mom passed away unexpectedly 07-16-2017"; c/o productive cough for about a month, unable to sleep, due to cough)   HPI   Chief Complaint  Patient presents with  . Cough    "very sad due to mom passed away unexpectedly 07/16/2017"; c/o productive cough for about a month, unable to sleep, due to cough     Pt never got better after seen here 5/21.  She was treated with azithromycin.   Pt had abnormal CT 5/16 with repeat scheduled for august.   She denies fevers.   Pt not sleeping.  She takes benedryl but says she is still only getting about 2 hours each night.   Relevant past medical, surgical, family and social history reviewed and updated as indicated. Interim medical history since our last visit reviewed. Allergies and medications reviewed and updated.   Current Outpatient Medications:  .  albuterol (PROVENTIL HFA) 108 (90 Base) MCG/ACT inhaler, Inhale 2 puffs into the lungs every 6 (six) hours as needed for wheezing or shortness of breath., Disp: , Rfl:  .  atorvastatin (LIPITOR) 20 MG tablet, Take 1 tablet (20 mg total) by mouth daily., Disp: 90 tablet, Rfl: 1 .  diphenhydrAMINE (BENADRYL) 25 MG tablet, Take 50 mg by mouth at bedtime. , Disp: , Rfl:  .  mometasone-formoterol (DULERA) 100-5 MCG/ACT AERO, Inhale 2 puffs into the lungs 2 (two) times daily., Disp: 3 Inhaler, Rfl: 2 .  montelukast (SINGULAIR) 10 MG tablet, TAKE 1 Tablet BY MOUTH ONCE DAILY AS NEEDED, Disp: 90 tablet, Rfl: 0 .  omeprazole (PRILOSEC) 40 MG capsule, TAKE 1 Capsule BY MOUTH ONCE DAILY, Disp: 90 capsule, Rfl:  1   Review of Systems  Constitutional: Negative for fever.  Respiratory: Positive for cough, shortness of breath and wheezing.   Psychiatric/Behavioral: Positive for dysphoric mood.    Per HPI unless specifically indicated above     Objective:    BP (!) 155/99 (BP Location: Right Arm, Patient Position: Sitting, Cuff Size: Normal)   Pulse 93   Temp (!) 97.4 F (36.3 C) (Oral)   Ht 4' 11.5" (1.511 m)   Wt 164 lb 8 oz (74.6 kg)   SpO2 97%   BMI 32.67 kg/m   Wt Readings from Last 3 Encounters:  07/10/17 164 lb 8 oz (74.6 kg)  06/11/17 159 lb 4 oz (72.2 kg)  05/21/17 157 lb 4 oz (71.3 kg)    Physical Exam  Constitutional: She is oriented to person, place, and time. She appears well-developed and well-nourished.  HENT:  Head: Normocephalic and atraumatic.  Neck: Neck supple.  Cardiovascular: Normal rate and regular rhythm.  Pulmonary/Chest: No respiratory distress. She has wheezes.  Abdominal: Soft. Bowel sounds are normal. There is no tenderness.  Neurological: She is alert and oriented to person, place, and time.  Skin: Skin is warm and dry.  Psychiatric: Her speech is normal and behavior is normal. She exhibits a depressed mood.  Tearful at times  Nursing  note and vitals reviewed.       Assessment & Plan:   Encounter Diagnoses  Name Primary?  Marland Kitchen COPD exacerbation ([place]) Yes  . Wheezing   . Grief   . Insomnia, unspecified type      -encouraged pt to get Grief counseling.  Gave her contact number for [facility]  -rx Temazepam for short term help with sleep.  No refills. Pt counseled to avoid etoh with this rx  -rx Prednisone for copd exacerbation.  Encouraged pt to avoid smoking  -encouraged pt to get nebulizer machine.  Gave rx for albuterol for nebulizer  -pt to follow up in August as scheduled.  RTO sooner for new symptoms or worsening

## 2017-08-21 ENCOUNTER — Other Ambulatory Visit: Payer: Self-pay | Admitting: Physician Assistant

## 2017-08-21 MED ORDER — NYSTATIN 100000 UNIT/ML MT SUSP: 5.0000 mL | Freq: Four times a day (QID) | OROMUCOSAL | 0 refills | Status: DC

## 2017-09-04 ENCOUNTER — Ambulatory Visit (HOSPITAL_COMMUNITY)
Admission: RE | Admit: 2017-09-04 | Discharge: 2017-09-04 | Disposition: A | Discharge status: Home / Self Care | Payer: Self-pay | Source: Ambulatory Visit | Attending: Physician Assistant | Admitting: Physician Assistant

## 2017-09-04 DIAGNOSIS — R918 Other nonspecific abnormal finding of lung field: Secondary | ICD-10-CM | POA: Insufficient documentation

## 2017-09-04 DIAGNOSIS — R05 Cough: Secondary | ICD-10-CM | POA: Insufficient documentation

## 2017-09-04 DIAGNOSIS — R059 Cough, unspecified: Secondary | ICD-10-CM

## 2017-09-11 ENCOUNTER — Ambulatory Visit: Payer: Self-pay | Admitting: Physician Assistant

## 2017-09-11 ENCOUNTER — Encounter: Payer: Self-pay | Admitting: Physician Assistant

## 2017-09-11 VITALS — BP 133/88 | HR 68 | Temp 98.2°F | Wt 175.0 lb

## 2017-09-11 DIAGNOSIS — R062 Wheezing: Secondary | ICD-10-CM

## 2017-09-11 DIAGNOSIS — F17219 Nicotine dependence, cigarettes, with unspecified nicotine-induced disorders: Secondary | ICD-10-CM

## 2017-09-11 DIAGNOSIS — F329 Major depressive disorder, single episode, unspecified: Secondary | ICD-10-CM

## 2017-09-11 DIAGNOSIS — F4321 Adjustment disorder with depressed mood: Secondary | ICD-10-CM

## 2017-09-11 DIAGNOSIS — E785 Hyperlipidemia, unspecified: Secondary | ICD-10-CM

## 2017-09-11 DIAGNOSIS — J449 Chronic obstructive pulmonary disease, unspecified: Secondary | ICD-10-CM

## 2017-09-11 DIAGNOSIS — F32A Depression, unspecified: Secondary | ICD-10-CM

## 2017-09-11 DIAGNOSIS — F419 Anxiety disorder, unspecified: Secondary | ICD-10-CM

## 2017-09-11 MED ORDER — MONTELUKAST SODIUM 10 MG PO TABS: 10.0000 mg | ORAL_TABLET | Freq: Every day | ORAL | 0 refills | Status: DC | PRN

## 2017-09-11 MED ORDER — OMEPRAZOLE 40 MG PO CPDR: 40.0000 mg | DELAYED_RELEASE_CAPSULE | Freq: Every day | ORAL | 1 refills | Status: DC

## 2017-09-11 MED ORDER — ALBUTEROL SULFATE (2.5 MG/3ML) 0.083% IN NEBU: 2.5000 mg | INHALATION_SOLUTION | Freq: Four times a day (QID) | RESPIRATORY_TRACT | 1 refills | Status: DC | PRN

## 2017-09-11 MED ORDER — MOMETASONE FURO-FORMOTEROL FUM 200-5 MCG/ACT IN AERO: 2.0000 | INHALATION_SPRAY | Freq: Two times a day (BID) | RESPIRATORY_TRACT | 1 refills | Status: DC

## 2017-09-11 MED ORDER — ATORVASTATIN CALCIUM 20 MG PO TABS: 20.0000 mg | ORAL_TABLET | Freq: Every day | ORAL | 1 refills | Status: DC

## 2017-09-11 MED ORDER — ALBUTEROL SULFATE HFA 108 (90 BASE) MCG/ACT IN AERS: 2.0000 | INHALATION_SPRAY | Freq: Four times a day (QID) | RESPIRATORY_TRACT | 1 refills | Status: DC | PRN

## 2017-09-11 NOTE — Progress Notes (Signed)
BP 133/88   Pulse 68   Temp 98.2 F (36.8 C)   Wt 175 lb (79.4 kg)   SpO2 99%   BMI 34.75 kg/m    Subjective:    Patient ID: Pamela Livingston, female    DOB: [birth year], 52 y.o.   MRN: [id number]  HPI: Pamela Livingston is a 52 y.o. female presenting on 09/11/2017 for Follow-up   HPI   Pt didn't contact [facility] as recommended at last OV.  She is still having grief, anxiety and depression.  Also insomnia.  No SI or HI.  Pt is still having "congestion".  She is still smoking and still having lots of sob and wheezing.   Relevant past medical, surgical, family and social history reviewed and updated as indicated. Interim medical history since our last visit reviewed. Allergies and medications reviewed and updated.    Current Outpatient Medications:  .  albuterol (PROVENTIL HFA) 108 (90 Base) MCG/ACT inhaler, Inhale 2 puffs into the lungs every 6 (six) hours as needed for wheezing or shortness of breath., Disp: , Rfl:  .  albuterol (PROVENTIL) (2.5 MG/3ML) 0.083% nebulizer solution, Take 3 mLs (2.5 mg total) by nebulization every 6 (six) hours as needed for wheezing or shortness of breath., Disp: 25 vial, Rfl: 1 .  atorvastatin (LIPITOR) 20 MG tablet, Take 1 tablet (20 mg total) by mouth daily., Disp: 90 tablet, Rfl: 1 .  diphenhydrAMINE (BENADRYL) 25 MG tablet, Take 50 mg by mouth at bedtime. , Disp: , Rfl:  .  mometasone-formoterol (DULERA) 100-5 MCG/ACT AERO, Inhale 2 puffs into the lungs 2 (two) times daily., Disp: 3 Inhaler, Rfl: 2 .  nystatin (MYCOSTATIN) 100000 UNIT/ML suspension, Take 5 mLs (500,000 Units total) by mouth 4 (four) times daily., Disp: 120 mL, Rfl: 0 .  omeprazole (PRILOSEC) 40 MG capsule, TAKE 1 Capsule BY MOUTH ONCE DAILY, Disp: 90 capsule, Rfl: 1 .  temazepam (RESTORIL) 15 MG capsule, Take 1 capsule (15 mg total) by mouth at bedtime as needed for sleep., Disp: 10 capsule, Rfl: 0 .  montelukast (SINGULAIR) 10 MG tablet, TAKE 1 Tablet BY MOUTH ONCE DAILY AS NEEDED, Disp:  90 tablet, Rfl: 0   Review of Systems  Constitutional: Positive for diaphoresis. Negative for appetite change, chills, fatigue, fever and unexpected weight change.  HENT: Positive for congestion, mouth sores, sneezing and trouble swallowing. Negative for dental problem, drooling, ear pain, facial swelling, hearing loss, sore throat and voice change.   Eyes: Negative for pain, discharge, redness, itching and visual disturbance.  Respiratory: Positive for cough, chest tightness, shortness of breath and wheezing. Negative for choking.   Cardiovascular: Positive for leg swelling. Negative for chest pain and palpitations.  Gastrointestinal: Negative for abdominal pain, blood in stool, constipation, diarrhea and vomiting.  Endocrine: Negative for cold intolerance, heat intolerance and polydipsia.  Genitourinary: Negative for decreased urine volume, dysuria and hematuria.  Musculoskeletal: Positive for arthralgias and back pain. Negative for gait problem.  Skin: Negative for rash.  Allergic/Immunologic: Negative for environmental allergies.  Neurological: Negative for seizures, syncope, light-headedness and headaches.  Hematological: Negative for adenopathy.  Psychiatric/Behavioral: Negative for agitation, dysphoric mood and suicidal ideas. The patient is not nervous/anxious.     Per HPI unless specifically indicated above     Objective:    BP 133/88   Pulse 68   Temp 98.2 F (36.8 C)   Wt 175 lb (79.4 kg)   SpO2 99%   BMI 34.75 kg/m   Wt Readings from Last  3 Encounters:  09/11/17 175 lb (79.4 kg)  07/10/17 164 lb 8 oz (74.6 kg)  06/11/17 159 lb 4 oz (72.2 kg)    Physical Exam  Constitutional: She is oriented to person, place, and time. She appears well-developed and well-nourished.  HENT:  Head: Normocephalic and atraumatic.  Neck: Neck supple.  Cardiovascular: Normal rate and regular rhythm.  Pulmonary/Chest: Effort normal and breath sounds normal.  Abdominal: Soft. Bowel  sounds are normal. She exhibits no mass. There is no hepatosplenomegaly. There is no tenderness.  Musculoskeletal: She exhibits no edema.  Lymphadenopathy:    She has no cervical adenopathy.  Neurological: She is alert and oriented to person, place, and time.  Skin: Skin is warm and dry.  Psychiatric: She has a normal mood and affect. Her behavior is normal.  Vitals reviewed.       Assessment & Plan:    Encounter Diagnoses  Name Primary?  . Chronic obstructive pulmonary disease, unspecified COPD type ([place]) Yes  . Wheezing   . Cigarette nicotine dependence with nicotine-induced disorder   . Grief   . Anxiety   . Depression, unspecified depression type   . Hyperlipidemia, unspecified hyperlipidemia type   \ -pt counseled to Go to [facility] for counseling and treatment -counseled Smoking cessaiton.  Discussed with pt that medications can only do so much to help her breathing if she continues to smoke.  Will increase dulera. She has nebulizers and an inhaler -reviewed results of CT scan with pt -Labs before follow up appointment in 3 weeks.  RTO sooner prn

## 2017-09-26 ENCOUNTER — Other Ambulatory Visit (HOSPITAL_COMMUNITY)
Admission: RE | Admit: 2017-09-26 | Discharge: 2017-09-26 | Disposition: A | Discharge status: Home / Self Care | Payer: PRIVATE HEALTH INSURANCE | Source: Ambulatory Visit | Attending: Physician Assistant | Admitting: Physician Assistant

## 2017-09-26 DIAGNOSIS — E785 Hyperlipidemia, unspecified: Secondary | ICD-10-CM | POA: Insufficient documentation

## 2017-09-26 LAB — COMPREHENSIVE METABOLIC PANEL
ALT: 23 U/L (ref 0–44)
AST: 24 U/L (ref 15–41)
Albumin: 4 g/dL (ref 3.5–5.0)
Alkaline Phosphatase: 80 U/L (ref 38–126)
Anion gap: 7 (ref 5–15)
BUN: 16 mg/dL (ref 6–20)
CHLORIDE: 108 mmol/L (ref 98–111)
CO2: 27 mmol/L (ref 22–32)
Calcium: 9.4 mg/dL (ref 8.9–10.3)
Creatinine, Ser: 0.72 mg/dL (ref 0.44–1.00)
GFR calc non Af Amer: >60 mL/min (ref >60)
Glucose, Bld: 97 mg/dL (ref 70–99)
Potassium: 4.2 mmol/L (ref 3.5–5.1)
Sodium: 142 mmol/L (ref 135–145)
Total Bilirubin: 0.7 mg/dL (ref 0.3–1.2)
Total Protein: 7.1 g/dL (ref 6.5–8.1)

## 2017-09-26 LAB — LIPID PANEL
CHOL/HDL RATIO: 3 ratio
CHOLESTEROL: 146 mg/dL (ref 0–200)
HDL: 49 mg/dL (ref >40)
LDL Cholesterol: 77 mg/dL (ref 0–99)
TRIGLYCERIDES: 102 mg/dL (ref <150)
VLDL: 20 mg/dL (ref 0–40)

## 2017-10-03 ENCOUNTER — Encounter: Payer: Self-pay | Admitting: Physician Assistant

## 2017-10-03 ENCOUNTER — Ambulatory Visit: Payer: Self-pay | Admitting: Physician Assistant

## 2017-10-03 VITALS — BP 116/78 | HR 92 | Temp 97.9°F | Ht 59.5 in | Wt 179.5 lb

## 2017-10-03 DIAGNOSIS — F17219 Nicotine dependence, cigarettes, with unspecified nicotine-induced disorders: Secondary | ICD-10-CM

## 2017-10-03 DIAGNOSIS — F32A Depression, unspecified: Secondary | ICD-10-CM

## 2017-10-03 DIAGNOSIS — K219 Gastro-esophageal reflux disease without esophagitis: Secondary | ICD-10-CM

## 2017-10-03 DIAGNOSIS — F329 Major depressive disorder, single episode, unspecified: Secondary | ICD-10-CM

## 2017-10-03 DIAGNOSIS — Z1211 Encounter for screening for malignant neoplasm of colon: Secondary | ICD-10-CM

## 2017-10-03 DIAGNOSIS — E785 Hyperlipidemia, unspecified: Secondary | ICD-10-CM

## 2017-10-03 DIAGNOSIS — F4321 Adjustment disorder with depressed mood: Secondary | ICD-10-CM

## 2017-10-03 DIAGNOSIS — J449 Chronic obstructive pulmonary disease, unspecified: Secondary | ICD-10-CM

## 2017-10-03 DIAGNOSIS — F419 Anxiety disorder, unspecified: Secondary | ICD-10-CM

## 2017-10-03 NOTE — Progress Notes (Signed)
BP 116/78 (BP Location: Left Arm, Patient Position: Sitting, Cuff Size: Normal)   Pulse 92   Temp 97.9 F (36.6 C)   Ht 4' 11.5" (1.511 m)   Wt 179 lb 8 oz (81.4 kg)   SpO2 96%   BMI 35.65 kg/m    Subjective:    Patient ID: Pamela Livingston, female    DOB: 03-Oct-1965, 52 y.o.   MRN: [id number]  HPI: Pamela Livingston is a 52 y.o. female presenting on 10/03/2017 for Hyperlipidemia   HPI   Pt is doing well.  She says she is still smoking but her breathing "isn't too bad".  She is still having depression and anxiety worsened by recent grief.  No SI, HI.   Relevant past medical, surgical, family and social history reviewed and updated as indicated. Interim medical history since our last visit reviewed. Allergies and medications reviewed and updated.   Current Outpatient Medications:  .  albuterol (PROVENTIL HFA) 108 (90 Base) MCG/ACT inhaler, Inhale 2 puffs into the lungs every 6 (six) hours as needed for wheezing or shortness of breath., Disp: 3 Inhaler, Rfl: 1 .  albuterol (PROVENTIL) (2.5 MG/3ML) 0.083% nebulizer solution, Take 3 mLs (2.5 mg total) by nebulization every 6 (six) hours as needed for wheezing or shortness of breath., Disp: 75 vial, Rfl: 1 .  atorvastatin (LIPITOR) 20 MG tablet, Take 1 tablet (20 mg total) by mouth daily., Disp: 90 tablet, Rfl: 1 .  diphenhydrAMINE (BENADRYL) 25 MG tablet, Take 50 mg by mouth at bedtime. , Disp: , Rfl:  .  mometasone-formoterol (DULERA) 200-5 MCG/ACT AERO, Inhale 2 puffs into the lungs 2 (two) times daily., Disp: 3 Inhaler, Rfl: 1 .  montelukast (SINGULAIR) 10 MG tablet, Take 1 tablet (10 mg total) by mouth daily as needed., Disp: 90 tablet, Rfl: 0 .  omeprazole (PRILOSEC) 40 MG capsule, Take 1 capsule (40 mg total) by mouth daily., Disp: 90 capsule, Rfl: 1   Review of Systems  Constitutional: Negative for appetite change, chills, diaphoresis, fatigue, fever and unexpected weight change.  HENT: Negative for congestion, dental problem,  drooling, ear pain, facial swelling, hearing loss, mouth sores, sneezing, sore throat, trouble swallowing and voice change.   Eyes: Negative for pain, discharge, redness, itching and visual disturbance.  Respiratory: Negative for cough, choking, shortness of breath and wheezing.   Cardiovascular: Negative for chest pain, palpitations and leg swelling.  Gastrointestinal: Negative for abdominal pain, blood in stool, constipation, diarrhea and vomiting.  Endocrine: Negative for cold intolerance, heat intolerance and polydipsia.  Genitourinary: Negative for decreased urine volume, dysuria and hematuria.  Musculoskeletal: Negative for arthralgias, back pain and gait problem.  Skin: Negative for rash.  Allergic/Immunologic: Negative for environmental allergies.  Neurological: Negative for seizures, syncope, light-headedness and headaches.  Hematological: Negative for adenopathy.  Psychiatric/Behavioral: Negative for agitation, dysphoric mood and suicidal ideas. The patient is not nervous/anxious.     Per HPI unless specifically indicated above     Objective:    BP 116/78 (BP Location: Left Arm, Patient Position: Sitting, Cuff Size: Normal)   Pulse 92   Temp 97.9 F (36.6 C)   Ht 4' 11.5" (1.511 m)   Wt 179 lb 8 oz (81.4 kg)   SpO2 96%   BMI 35.65 kg/m   Wt Readings from Last 3 Encounters:  10/03/17 179 lb 8 oz (81.4 kg)  09/11/17 175 lb (79.4 kg)  07/10/17 164 lb 8 oz (74.6 kg)    Physical Exam  Constitutional: She is  oriented to person, place, and time. She appears well-developed and well-nourished.  HENT:  Head: Normocephalic and atraumatic.  Neck: Neck supple.  Cardiovascular: Normal rate and regular rhythm.  Pulmonary/Chest: Effort normal. No respiratory distress. She has wheezes (scattered expiratory).  Abdominal: Soft. Bowel sounds are normal. She exhibits no mass. There is no hepatosplenomegaly. There is no tenderness.  Musculoskeletal: She exhibits no edema.   Lymphadenopathy:    She has no cervical adenopathy.  Neurological: She is alert and oriented to person, place, and time.  Skin: Skin is warm and dry.  Psychiatric: She has a normal mood and affect. Her behavior is normal.  Vitals reviewed.   Results for orders placed or performed during the hospital encounter of 09/26/17  Lipid panel  Result Value Ref Range   Cholesterol 146 0 - 200 mg/dL   Triglycerides 102 <150 mg/dL   HDL 49 >40 mg/dL   Total CHOL/HDL Ratio 3.0 RATIO   VLDL 20 0 - 40 mg/dL   LDL Cholesterol 77 0 - 99 mg/dL  Comprehensive metabolic panel  Result Value Ref Range   Sodium 142 135 - 145 mmol/L   Potassium 4.2 3.5 - 5.1 mmol/L   Chloride 108 98 - 111 mmol/L   CO2 27 22 - 32 mmol/L   Glucose, Bld 97 70 - 99 mg/dL   BUN 16 6 - 20 mg/dL   Creatinine, Ser 0.72 0.44 - 1.00 mg/dL   Calcium 9.4 8.9 - 10.3 mg/dL   Total Protein 7.1 6.5 - 8.1 g/dL   Albumin 4.0 3.5 - 5.0 g/dL   AST 24 15 - 41 U/L   ALT 23 0 - 44 U/L   Alkaline Phosphatase 80 38 - 126 U/L   Total Bilirubin 0.7 0.3 - 1.2 mg/dL   GFR calc non Af Amer >60 >60 mL/min   GFR calc Af Amer >60 >60 mL/min   Anion gap 7 5 - 15      Assessment & Plan:   Encounter Diagnoses  Name Primary?  . Chronic obstructive pulmonary disease, unspecified COPD type ([place]) Yes  . Hyperlipidemia, unspecified hyperlipidemia type   . Cigarette nicotine dependence with nicotine-induced disorder   . Grief   . Depression, unspecified depression type   . Anxiety   . Screening for colon cancer   . Gastroesophageal reflux disease, esophagitis presence not specified     -reviewed labs with pt -Recommended pt contact [facility] for counseling -pt was given ifobt for colon cancer screening -counseled Smoking cessation -pt to Follow up 4 month.  RTO sooner prn

## 2017-10-06 ENCOUNTER — Other Ambulatory Visit: Payer: Self-pay | Admitting: Physician Assistant

## 2017-10-06 DIAGNOSIS — Z1211 Encounter for screening for malignant neoplasm of colon: Secondary | ICD-10-CM

## 2017-10-24 LAB — IFOBT (OCCULT BLOOD): IMMUNOLOGICAL FECAL OCCULT BLOOD TEST: NEGATIVE

## 2017-11-14 ENCOUNTER — Other Ambulatory Visit: Payer: Self-pay | Admitting: Physician Assistant

## 2017-12-06 ENCOUNTER — Other Ambulatory Visit: Payer: Self-pay | Admitting: Physician Assistant

## 2018-01-29 ENCOUNTER — Other Ambulatory Visit (HOSPITAL_COMMUNITY)
Admission: RE | Admit: 2018-01-29 | Discharge: 2018-01-29 | Disposition: A | Discharge status: Home / Self Care | Payer: PRIVATE HEALTH INSURANCE | Source: Ambulatory Visit | Attending: Physician Assistant | Admitting: Physician Assistant

## 2018-01-29 DIAGNOSIS — E785 Hyperlipidemia, unspecified: Secondary | ICD-10-CM

## 2018-01-29 LAB — LIPID PANEL
Cholesterol: 204 mg/dL — ABNORMAL HIGH (ref 0–200)
HDL: 44 mg/dL (ref >40)
LDL Cholesterol: 134 mg/dL — ABNORMAL HIGH (ref 0–99)
Total CHOL/HDL Ratio: 4.6 RATIO
Triglycerides: 129 mg/dL (ref <150)
VLDL: 26 mg/dL (ref 0–40)

## 2018-01-29 LAB — COMPREHENSIVE METABOLIC PANEL
ALT: 15 U/L (ref 0–44)
AST: 16 U/L (ref 15–41)
Albumin: 3.9 g/dL (ref 3.5–5.0)
Alkaline Phosphatase: 79 U/L (ref 38–126)
Anion gap: 8 (ref 5–15)
BUN: 17 mg/dL (ref 6–20)
CO2: 25 mmol/L (ref 22–32)
CREATININE: 0.72 mg/dL (ref 0.44–1.00)
Calcium: 9.3 mg/dL (ref 8.9–10.3)
Chloride: 108 mmol/L (ref 98–111)
GFR calc Af Amer: >60 mL/min (ref >60)
GFR calc non Af Amer: >60 mL/min (ref >60)
Glucose, Bld: 100 mg/dL — ABNORMAL HIGH (ref 70–99)
Potassium: 4 mmol/L (ref 3.5–5.1)
Sodium: 141 mmol/L (ref 135–145)
Total Bilirubin: 0.6 mg/dL (ref 0.3–1.2)
Total Protein: 7.1 g/dL (ref 6.5–8.1)

## 2018-02-03 ENCOUNTER — Encounter: Payer: Self-pay | Admitting: Physician Assistant

## 2018-02-03 ENCOUNTER — Ambulatory Visit: Payer: Self-pay | Admitting: Physician Assistant

## 2018-02-03 VITALS — BP 127/90 | HR 90 | Temp 98.1°F

## 2018-02-03 DIAGNOSIS — F17200 Nicotine dependence, unspecified, uncomplicated: Secondary | ICD-10-CM

## 2018-02-03 DIAGNOSIS — E785 Hyperlipidemia, unspecified: Secondary | ICD-10-CM

## 2018-02-03 DIAGNOSIS — F4321 Adjustment disorder with depressed mood: Secondary | ICD-10-CM

## 2018-02-03 DIAGNOSIS — J449 Chronic obstructive pulmonary disease, unspecified: Secondary | ICD-10-CM

## 2018-02-03 DIAGNOSIS — J441 Chronic obstructive pulmonary disease with (acute) exacerbation: Secondary | ICD-10-CM

## 2018-02-03 DIAGNOSIS — K219 Gastro-esophageal reflux disease without esophagitis: Secondary | ICD-10-CM

## 2018-02-03 DIAGNOSIS — F32A Depression, unspecified: Secondary | ICD-10-CM

## 2018-02-03 DIAGNOSIS — F329 Major depressive disorder, single episode, unspecified: Secondary | ICD-10-CM

## 2018-02-03 MED ORDER — AZITHROMYCIN 250 MG PO TABS: ORAL_TABLET | ORAL | 0 refills | Status: AC

## 2018-02-03 MED ORDER — PREDNISONE 20 MG PO TABS: 20.0000 mg | ORAL_TABLET | Freq: Two times a day (BID) | ORAL | 0 refills | Status: AC

## 2018-02-03 NOTE — Progress Notes (Signed)
BP 127/90   Pulse 90   Temp 98.1 F (36.7 C)   SpO2 99%    Subjective:    Patient ID: Pamela Livingston, female    DOB: 25-May-1965, 53 y.o.   MRN: [id number]  HPI: Pamela Livingston is a 53 y.o. female presenting on 02/03/2018 for Hyperlipidemia and COPD   HPI   Pt is still smoking.  She has been coughing but says it never goes away.  She says she has some blood tinged in her sputum for a couple of months.   She had a CT chest in August that showed scarring.  Pt denies fevers.   Pt is still having some depression from the death of her mother.   No SI or HI   Relevant past medical, surgical, family and social history reviewed and updated as indicated. Interim medical history since our last visit reviewed. Allergies and medications reviewed and updated.   Current Outpatient Medications:  .  albuterol (PROVENTIL HFA) 108 (90 Base) MCG/ACT inhaler, Inhale 2 puffs into the lungs every 6 (six) hours as needed for wheezing or shortness of breath., Disp: 3 Inhaler, Rfl: 1 .  albuterol (PROVENTIL) (2.5 MG/3ML) 0.083% nebulizer solution, INHALE 1 VIAL VIA NEBULIZER EVERY 6 HOURS AS NEEDED FOR WHEEZING OR SHORTNESS OF BREATH, Disp: 225 mL, Rfl: 2 .  atorvastatin (LIPITOR) 20 MG tablet, Take 1 tablet (20 mg total) by mouth daily., Disp: 90 tablet, Rfl: 1 .  diphenhydrAMINE (BENADRYL) 25 MG tablet, Take 50 mg by mouth at bedtime. , Disp: , Rfl:  .  mometasone-formoterol (DULERA) 200-5 MCG/ACT AERO, Inhale 2 puffs into the lungs 2 (two) times daily., Disp: 3 Inhaler, Rfl: 1 .  montelukast (SINGULAIR) 10 MG tablet, TAKE 1 Tablet BY MOUTH ONCE DAILY AS NEEDED, Disp: 90 tablet, Rfl: 0 .  omeprazole (PRILOSEC) 40 MG capsule, Take 1 capsule (40 mg total) by mouth daily., Disp: 90 capsule, Rfl: 1    Review of Systems  Constitutional: Negative for appetite change, chills, diaphoresis, fatigue, fever and unexpected weight change.  HENT: Positive for congestion, dental problem, drooling, sneezing, sore throat  and trouble swallowing. Negative for ear pain, facial swelling, hearing loss, mouth sores and voice change.   Eyes: Negative for pain, discharge, redness, itching and visual disturbance.  Respiratory: Positive for cough, shortness of breath and wheezing. Negative for choking.   Cardiovascular: Positive for chest pain and leg swelling. Negative for palpitations.  Gastrointestinal: Negative for abdominal pain, blood in stool, constipation, diarrhea and vomiting.  Endocrine: Negative for cold intolerance, heat intolerance and polydipsia.  Genitourinary: Negative for decreased urine volume, dysuria and hematuria.  Musculoskeletal: Positive for arthralgias and back pain. Negative for gait problem.  Skin: Negative for rash.  Allergic/Immunologic: Negative for environmental allergies.  Neurological: Negative for seizures, syncope, light-headedness and headaches.  Hematological: Negative for adenopathy.  Psychiatric/Behavioral: Negative for agitation, dysphoric mood and suicidal ideas. The patient is not nervous/anxious.     Per HPI unless specifically indicated above     Objective:    BP 127/90   Pulse 90   Temp 98.1 F (36.7 C)   SpO2 99%   Wt Readings from Last 3 Encounters:  10/03/17 179 lb 8 oz (81.4 kg)  09/11/17 175 lb (79.4 kg)  07/10/17 164 lb 8 oz (74.6 kg)    Physical Exam Vitals signs reviewed.  Constitutional:      Appearance: She is well-developed.  HENT:     Head: Normocephalic and atraumatic.  Neck:  Musculoskeletal: Neck supple.  Cardiovascular:     Rate and Rhythm: Normal rate and regular rhythm.  Pulmonary:     Effort: Pulmonary effort is normal. No respiratory distress.     Breath sounds: Wheezing present.  Abdominal:     General: Bowel sounds are normal.     Palpations: Abdomen is soft. There is no mass.     Tenderness: There is no abdominal tenderness.  Lymphadenopathy:     Cervical: No cervical adenopathy.  Skin:    General: Skin is warm and dry.   Neurological:     Mental Status: She is alert and oriented to person, place, and time.  Psychiatric:        Behavior: Behavior normal.     Results for orders placed or performed during the hospital encounter of 01/29/18  Lipid panel  Result Value Ref Range   Cholesterol 204 (H) 0 - 200 mg/dL   Triglycerides 129 <150 mg/dL   HDL 44 >40 mg/dL   Total CHOL/HDL Ratio 4.6 RATIO   VLDL 26 0 - 40 mg/dL   LDL Cholesterol 134 (H) 0 - 99 mg/dL  Comprehensive metabolic panel  Result Value Ref Range   Sodium 141 135 - 145 mmol/L   Potassium 4.0 3.5 - 5.1 mmol/L   Chloride 108 98 - 111 mmol/L   CO2 25 22 - 32 mmol/L   Glucose, Bld 100 (H) 70 - 99 mg/dL   BUN 17 6 - 20 mg/dL   Creatinine, Ser 0.72 0.44 - 1.00 mg/dL   Calcium 9.3 8.9 - 10.3 mg/dL   Total Protein 7.1 6.5 - 8.1 g/dL   Albumin 3.9 3.5 - 5.0 g/dL   AST 16 15 - 41 U/L   ALT 15 0 - 44 U/L   Alkaline Phosphatase 79 38 - 126 U/L   Total Bilirubin 0.6 0.3 - 1.2 mg/dL   GFR calc non Af Amer >60 >60 mL/min   GFR calc Af Amer >60 >60 mL/min   Anion gap 8 5 - 15      Assessment & Plan:   Encounter Diagnoses  Name Primary?  . Chronic obstructive pulmonary disease, unspecified COPD type ([place]) Yes  . COPD exacerbation ([place])   . Hyperlipidemia, unspecified hyperlipidemia type   . Tobacco use disorder   . Grief   . Depression, unspecified depression type   . Gastroesophageal reflux disease, esophagitis presence not specified     -Reviewed labs with pt -rx zpack and prednisone -coounseled Smoking cessation -recommended [facility] for bereavement counseling -counseled pt to Watch lowfat diet -pt to follow up 3 months.  She is to RTO sooner if her breathing/cough doesn't improve with treatment

## 2018-03-11 ENCOUNTER — Other Ambulatory Visit: Payer: Self-pay | Admitting: Physician Assistant

## 2018-04-09 ENCOUNTER — Encounter: Payer: Self-pay | Admitting: Physician Assistant

## 2018-04-09 ENCOUNTER — Ambulatory Visit: Payer: Self-pay | Admitting: Physician Assistant

## 2018-04-09 DIAGNOSIS — F17200 Nicotine dependence, unspecified, uncomplicated: Secondary | ICD-10-CM

## 2018-04-09 DIAGNOSIS — J441 Chronic obstructive pulmonary disease with (acute) exacerbation: Secondary | ICD-10-CM

## 2018-04-09 MED ORDER — PREDNISONE 20 MG PO TABS: 20.0000 mg | ORAL_TABLET | Freq: Two times a day (BID) | ORAL | 0 refills | Status: DC

## 2018-04-09 MED ORDER — AZITHROMYCIN 250 MG PO TABS: ORAL_TABLET | ORAL | 0 refills | Status: DC

## 2018-04-09 NOTE — Progress Notes (Signed)
   There were no vitals taken for this visit.   Subjective:    Patient ID: Pamela Livingston, female    DOB: 02/18/1965, 53 y.o.   MRN: [id number]  HPI: Pamela Livingston is a 53 y.o. female presenting on 04/09/2018 for URI   HPI  This is a telemedicine visit conducted through [employer].  Pt complains of head congestion.  She Added some claritin-d without much improvement.   Not much sneezing.  Some runny nose.  Some cough.   Still smoking but less- about 1/3 ppd.  Pt denies fevers.    She is using her nebulizer 3 or 4 times/day.  She says it is Not helping as much as usual   Relevant past medical, surgical, family and social history reviewed and updated as indicated. Interim medical history since our last visit reviewed. Allergies and medications reviewed and updated.   Current Outpatient Medications:  .  albuterol (PROVENTIL) (2.5 MG/3ML) 0.083% nebulizer solution, INHALE 1 VIAL VIA NEBULIZER EVERY 6 HOURS AS NEEDED FOR WHEEZING OR SHORTNESS OF BREATH, Disp: 225 mL, Rfl: 0 .  atorvastatin (LIPITOR) 20 MG tablet, TAKE 1 Tablet BY MOUTH ONCE DAILY, Disp: 90 tablet, Rfl: 1 .  diphenhydrAMINE (BENADRYL) 25 MG tablet, Take 50 mg by mouth at bedtime. , Disp: , Rfl:  .  DULERA 200-5 MCG/ACT AERO, INHALE 2 PUFFS BY MOUTH TWICE DAILY. RINSE MOUTH AFTER EACH USE, Disp: 39 g, Rfl: 1 .  montelukast (SINGULAIR) 10 MG tablet, TAKE 1 Tablet BY MOUTH ONCE DAILY AS NEEDED, Disp: 90 tablet, Rfl: 0 .  omeprazole (PRILOSEC) 40 MG capsule, TAKE 1 Capsule BY MOUTH ONCE DAILY, Disp: 90 capsule, Rfl: 1 .  PROVENTIL HFA 108 (90 Base) MCG/ACT inhaler, INHALE 2 PUFFS BY MOUTH EVERY 6 HOURS AS NEEDED FOR COUGHING, WHEEZING, OR SHORTNESS OF BREATH, Disp: 20.1 g, Rfl: 1    Review of Systems  Per HPI unless specifically indicated above     Objective:    There were no vitals taken for this visit.  Wt Readings from Last 3 Encounters:  10/03/17 179 lb 8 oz (81.4 kg)  09/11/17 175 lb (79.4 kg)  07/10/17 164 lb 8 oz (74.6  kg)    Physical Exam HENT:     Head: Normocephalic and atraumatic.  Pulmonary:     Effort: Pulmonary effort is normal. No respiratory distress.  Neurological:     Mental Status: She is alert and oriented to person, place, and time.  Psychiatric:        Mood and Affect: Mood normal.        Behavior: Behavior normal.          Assessment & Plan:   Encounter Diagnoses  Name Primary?  Marland Kitchen COPD exacerbation ([place]) Yes  . Tobacco use disorder     -rx prednisone and zpack for copd exacerbation.  Pt has had this in the past and agrees with treatment.  She will use her nebs as needed.   -pt cautioned to stay home in light of current coronavirus and her copd flare -encouraged pt to avoid smoking -pt to contact office for worsening or new symptoms. Go to ER if needed -pt to follow up here as scheduled

## 2018-05-05 ENCOUNTER — Encounter: Payer: Self-pay | Admitting: Physician Assistant

## 2018-05-05 ENCOUNTER — Ambulatory Visit: Payer: Self-pay | Admitting: Physician Assistant

## 2018-05-05 DIAGNOSIS — J449 Chronic obstructive pulmonary disease, unspecified: Secondary | ICD-10-CM

## 2018-05-05 DIAGNOSIS — F17200 Nicotine dependence, unspecified, uncomplicated: Secondary | ICD-10-CM

## 2018-05-05 DIAGNOSIS — E785 Hyperlipidemia, unspecified: Secondary | ICD-10-CM

## 2018-05-05 DIAGNOSIS — K219 Gastro-esophageal reflux disease without esophagitis: Secondary | ICD-10-CM

## 2018-05-05 NOTE — Progress Notes (Signed)
There were no vitals taken for this visit.   Subjective:    Patient ID: Pamela Livingston, female    DOB: Mar 08, 1965, 53 y.o.   MRN: [id number]  HPI: Pamela Livingston is a 53 y.o. female presenting on 05/05/2018 for No chief complaint on file.   HPI   This is a telemedicine visit through [employer] due to the coronavirus pandemic.  I connected with  Pamela Livingston on 05/05/18 by a video enabled telemedicine application and verified that I am speaking with the correct person using two identifiers.   I discussed the limitations of evaluation and management by telemedicine. The patient expressed understanding and agreed to proceed.   Pt is using claritin-d for her allergies  She is still smoking.   Pt says mood is good.  She is feeling well and has no complaints.  She is says she is enjoying her new dog which is a Engineer, mining.  Relevant past medical, surgical, family and social history reviewed and updated as indicated. Interim medical history since our last visit reviewed. Allergies and medications reviewed and updated.    Current Outpatient Medications:  .  albuterol (PROVENTIL) (2.5 MG/3ML) 0.083% nebulizer solution, INHALE 1 VIAL VIA NEBULIZER EVERY 6 HOURS AS NEEDED FOR WHEEZING OR SHORTNESS OF BREATH, Disp: 225 mL, Rfl: 0 .  atorvastatin (LIPITOR) 20 MG tablet, TAKE 1 Tablet BY MOUTH ONCE DAILY, Disp: 90 tablet, Rfl: 1 .  diphenhydrAMINE (BENADRYL) 25 MG tablet, Take 50 mg by mouth at bedtime. , Disp: , Rfl:  .  DULERA 200-5 MCG/ACT AERO, INHALE 2 PUFFS BY MOUTH TWICE DAILY. RINSE MOUTH AFTER EACH USE, Disp: 39 g, Rfl: 1 .  loratadine-pseudoephedrine (CLARITIN-D 24-HOUR) 10-240 MG 24 hr tablet, Take 1 tablet by mouth daily., Disp: , Rfl:  .  montelukast (SINGULAIR) 10 MG tablet, TAKE 1 Tablet BY MOUTH ONCE DAILY AS NEEDED, Disp: 90 tablet, Rfl: 0 .  omeprazole (PRILOSEC) 40 MG capsule, TAKE 1 Capsule BY MOUTH ONCE DAILY, Disp: 90 capsule, Rfl: 1 .  PROVENTIL HFA 108 (90 Base) MCG/ACT inhaler,  INHALE 2 PUFFS BY MOUTH EVERY 6 HOURS AS NEEDED FOR COUGHING, WHEEZING, OR SHORTNESS OF BREATH, Disp: 20.1 g, Rfl: 1   Review of Systems  Per HPI unless specifically indicated above     Objective:    There were no vitals taken for this visit.  Wt Readings from Last 3 Encounters:  10/03/17 179 lb 8 oz (81.4 kg)  09/11/17 175 lb (79.4 kg)  07/10/17 164 lb 8 oz (74.6 kg)    Physical Exam Constitutional:      Appearance: Normal appearance.  HENT:     Head: Normocephalic and atraumatic.  Pulmonary:     Effort: Pulmonary effort is normal. No respiratory distress.  Neurological:     Mental Status: She is alert and oriented to person, place, and time.  Psychiatric:        Mood and Affect: Mood normal.        Behavior: Behavior normal.          Assessment & Plan:    Encounter Diagnoses  Name Primary?  . Chronic obstructive pulmonary disease, unspecified COPD type ([place]) Yes  . Hyperlipidemia, unspecified hyperlipidemia type   . Tobacco use disorder   . Gastroesophageal reflux disease, esophagitis presence not specified     -pt to continue current medications -she is encouraged to stop smoking -pt is encouraged to follow recommended guidelines including staying at home except for groceries and to  wear face covering when she goes out -pt to Follow up in the office in 3 months.  She is to call office sooner prn

## 2018-06-04 ENCOUNTER — Other Ambulatory Visit: Payer: Self-pay | Admitting: Physician Assistant

## 2018-07-14 ENCOUNTER — Encounter: Payer: Self-pay | Admitting: Physician Assistant

## 2018-07-14 ENCOUNTER — Ambulatory Visit: Payer: Self-pay | Admitting: Physician Assistant

## 2018-07-14 DIAGNOSIS — M7989 Other specified soft tissue disorders: Secondary | ICD-10-CM

## 2018-07-14 NOTE — Progress Notes (Signed)
There were no vitals taken for this visit.   Subjective:    Patient ID: Pamela Livingston, female    DOB: [birth year], 53 y.o.   MRN: [id number]  HPI: Pamela Livingston is a 53 y.o. female presenting on 07/14/2018 for No chief complaint on file.   HPI This is a telemedicine visit through [employer] due to coronavirus pandemic  I connected with  Pamela Livingston on 07/14/18 by a video enabled telemedicine application and verified that I am speaking with the correct person using two identifiers.   I discussed the limitations of evaluation and management by telemedicine. The patient expressed understanding and agreed to proceed.  Pt is at home.  Provider is at office  Pt says she Injured foot in February when she dropped piece of wood on it.  she says she has had Pain ever since.   She says it is now Swelling from the L foot to From knee all the way down.   She denies redness, fever, SOB.  She denies re-injury.  She is a smoker   Relevant past medical, surgical, family and social history reviewed and updated as indicated. Interim medical history since our last visit reviewed. Allergies and medications reviewed and updated.   .   Current Outpatient Medications:  .  albuterol (PROVENTIL) (2.5 MG/3ML) 0.083% nebulizer solution, INHALE 1 VIAL VIA NEBULIZER EVERY 6 HOURS AS NEEDED FOR WHEEZING OR SHORTNESS OF BREATH, Disp: 225 mL, Rfl: 0 .  atorvastatin (LIPITOR) 20 MG tablet, TAKE 1 Tablet BY MOUTH ONCE DAILY, Disp: 90 tablet, Rfl: 1 .  diphenhydrAMINE (BENADRYL) 25 MG tablet, Take 50 mg by mouth at bedtime. , Disp: , Rfl:  .  DULERA 200-5 MCG/ACT AERO, INHALE 2 PUFFS BY MOUTH TWICE DAILY. RINSE MOUTH AFTER EACH USE, Disp: 39 g, Rfl: 1 .  loratadine-pseudoephedrine (CLARITIN-D 24-HOUR) 10-240 MG 24 hr tablet, Take 1 tablet by mouth daily., Disp: , Rfl:  .  montelukast (SINGULAIR) 10 MG tablet, TAKE 1 Tablet BY MOUTH EVERY DAY AS NEEDED, Disp: 90 tablet, Rfl: 0 .  omeprazole (PRILOSEC) 40 MG capsule, TAKE 1  Capsule BY MOUTH ONCE DAILY, Disp: 90 capsule, Rfl: 1 .  PROVENTIL HFA 108 (90 Base) MCG/ACT inhaler, INHALE 2 PUFFS BY MOUTH EVERY 6 HOURS AS NEEDED FOR COUGHING, WHEEZING, OR SHORTNESS OF BREATH, Disp: 20.1 g, Rfl: 1 Review of Systems  Per HPI unless specifically indicated above     Objective:    There were no vitals taken for this visit.  Wt Readings from Last 3 Encounters:  10/03/17 179 lb 8 oz (81.4 kg)  09/11/17 175 lb (79.4 kg)  07/10/17 164 lb 8 oz (74.6 kg)    Physical Exam Constitutional:      General: She is not in acute distress.    Appearance: Normal appearance. She is obese. She is not ill-appearing.  Pulmonary:     Effort: Pulmonary effort is normal. No respiratory distress.  Musculoskeletal:        General: Swelling present.     Comments: There is some swelling of the LLE.  No redness seen.  goot ROM L knee and ankle.  Skin:    Findings: No erythema.  Neurological:     Mental Status: She is alert and oriented to person, place, and time.  Psychiatric:        Attention and Perception: Attention normal.        Speech: Speech normal.        Behavior: Behavior is  cooperative.        Cognition and Memory: Cognition normal.           Assessment & Plan:     Encounter Diagnosis  Name Primary?  . Left leg swelling Yes    -Will obtain doppler US for r/o DVT -pt will be mailled application for [facility] -pt counseled on moist heat and elevation.  Also encouraged smoking cessation -Pt counseled to go to ER if she develops SOB -pt has follow up appointment in July

## 2018-07-16 ENCOUNTER — Telehealth: Payer: Self-pay | Admitting: Student

## 2018-07-16 NOTE — Telephone Encounter (Signed)
Pt called requesting that her R leg also be included in her [place] that she has tomorrow, 07-17-18. Pt's [place] is for her L leg swelling to rule out DVT (pt discussed regarding this with PA on 07-14-18). Pt states her R leg is now also swollen as to reason why she wants it included in [place].  LPN explained to patient that [place] is ordered to rule out DVT and considering her R leg is swollen now pt is advised to go to the ER. Pt verbalized understanding.

## 2018-07-17 ENCOUNTER — Ambulatory Visit (HOSPITAL_COMMUNITY)
Admission: RE | Admit: 2018-07-17 | Discharge: 2018-07-17 | Disposition: A | Discharge status: Home / Self Care | Payer: Self-pay | Source: Ambulatory Visit | Attending: Physician Assistant | Admitting: Physician Assistant

## 2018-07-17 ENCOUNTER — Other Ambulatory Visit: Payer: Self-pay

## 2018-07-17 DIAGNOSIS — M7989 Other specified soft tissue disorders: Secondary | ICD-10-CM | POA: Insufficient documentation

## 2018-07-23 ENCOUNTER — Telehealth: Payer: Self-pay | Admitting: Student

## 2018-07-23 NOTE — Telephone Encounter (Signed)
PA denied request for fluid medication. PA advises for patient to continue walking as it seems to be helping with her legs swelling. Pt was also advised weight loss and smoking cessation which will also help with legs swelling. Pt verbalized understanding.

## 2018-07-29 ENCOUNTER — Other Ambulatory Visit: Payer: Self-pay

## 2018-07-29 ENCOUNTER — Other Ambulatory Visit (HOSPITAL_COMMUNITY)
Admission: RE | Admit: 2018-07-29 | Discharge: 2018-07-29 | Disposition: A | Discharge status: Home / Self Care | Payer: Self-pay | Source: Ambulatory Visit | Attending: Physician Assistant | Admitting: Physician Assistant

## 2018-07-29 DIAGNOSIS — E785 Hyperlipidemia, unspecified: Secondary | ICD-10-CM | POA: Insufficient documentation

## 2018-07-29 LAB — COMPREHENSIVE METABOLIC PANEL
ALT: 13 U/L (ref 0–44)
AST: 15 U/L (ref 15–41)
Albumin: 4.1 g/dL (ref 3.5–5.0)
Alkaline Phosphatase: 93 U/L (ref 38–126)
Anion gap: 9 (ref 5–15)
BUN: 14 mg/dL (ref 6–20)
CO2: 22 mmol/L (ref 22–32)
Calcium: 9 mg/dL (ref 8.9–10.3)
Chloride: 110 mmol/L (ref 98–111)
Creatinine, Ser: 0.8 mg/dL (ref 0.44–1.00)
GFR calc Af Amer: >60 mL/min (ref >60)
GFR calc non Af Amer: >60 mL/min (ref >60)
Glucose, Bld: 103 mg/dL — ABNORMAL HIGH (ref 70–99)
Potassium: 4 mmol/L (ref 3.5–5.1)
Sodium: 141 mmol/L (ref 135–145)
Total Bilirubin: 0.6 mg/dL (ref 0.3–1.2)
Total Protein: 7.1 g/dL (ref 6.5–8.1)

## 2018-07-29 LAB — LIPID PANEL
Cholesterol: 211 mg/dL — ABNORMAL HIGH (ref 0–200)
HDL: 39 mg/dL — ABNORMAL LOW (ref >40)
LDL Cholesterol: 154 mg/dL — ABNORMAL HIGH (ref 0–99)
Total CHOL/HDL Ratio: 5.4 RATIO
Triglycerides: 91 mg/dL (ref <150)
VLDL: 18 mg/dL (ref 0–40)

## 2018-08-04 ENCOUNTER — Ambulatory Visit: Payer: Self-pay | Admitting: Physician Assistant

## 2018-08-04 ENCOUNTER — Encounter: Payer: Self-pay | Admitting: Physician Assistant

## 2018-08-04 DIAGNOSIS — J449 Chronic obstructive pulmonary disease, unspecified: Secondary | ICD-10-CM

## 2018-08-04 DIAGNOSIS — K219 Gastro-esophageal reflux disease without esophagitis: Secondary | ICD-10-CM

## 2018-08-04 DIAGNOSIS — E785 Hyperlipidemia, unspecified: Secondary | ICD-10-CM

## 2018-08-04 DIAGNOSIS — F17200 Nicotine dependence, unspecified, uncomplicated: Secondary | ICD-10-CM

## 2018-08-04 DIAGNOSIS — Z1239 Encounter for other screening for malignant neoplasm of breast: Secondary | ICD-10-CM

## 2018-08-04 NOTE — Progress Notes (Signed)
There were no vitals taken for this visit.   Subjective:    Patient ID: Pamela Livingston, female    DOB: [birth year], 53 y.o.   MRN: [id number]  HPI: [name] is a 53 y.o. female presenting on 08/04/2018 for No chief complaint on file.   HPI    This is a telemedicine appointment through [employer] due to coronavirus pandemic  I connected with  Pamela Livingston on 08/04/18 by a video enabled telemedicine application and verified that I am speaking with the correct person using two identifiers.   I discussed the limitations of evaluation and management by telemedicine. The patient expressed understanding and agreed to proceed.  Pt is at home.  Provider is at office.   Pt says she is doing well.  She moved recently and says her breathing is doing better.  She thinks she had mold in her previous residence.  Pt is still smoking.  She has no complaints today.    Relevant past medical, surgical, family and social history reviewed and updated as indicated. Interim medical history since our last visit reviewed. Allergies and medications reviewed and updated.     Current Outpatient Medications:  .  albuterol (PROVENTIL) (2.5 MG/3ML) 0.083% nebulizer solution, INHALE 1 VIAL VIA NEBULIZER EVERY 6 HOURS AS NEEDED FOR WHEEZING OR SHORTNESS OF BREATH, Disp: 225 mL, Rfl: 0 .  atorvastatin (LIPITOR) 20 MG tablet, TAKE 1 Tablet BY MOUTH ONCE DAILY, Disp: 90 tablet, Rfl: 1 .  diphenhydrAMINE (BENADRYL) 25 MG tablet, Take 50 mg by mouth at bedtime. , Disp: , Rfl:  .  DULERA 200-5 MCG/ACT AERO, INHALE 2 PUFFS BY MOUTH TWICE DAILY. RINSE MOUTH AFTER EACH USE, Disp: 39 g, Rfl: 1 .  loratadine-pseudoephedrine (CLARITIN-D 24-HOUR) 10-240 MG 24 hr tablet, Take 1 tablet by mouth daily., Disp: , Rfl:  .  montelukast (SINGULAIR) 10 MG tablet, TAKE 1 Tablet BY MOUTH EVERY DAY AS NEEDED, Disp: 90 tablet, Rfl: 0 .  omeprazole (PRILOSEC) 40 MG capsule, TAKE 1 Capsule BY MOUTH ONCE DAILY, Disp: 90 capsule, Rfl: 1 .   PROVENTIL HFA 108 (90 Base) MCG/ACT inhaler, INHALE 2 PUFFS BY MOUTH EVERY 6 HOURS AS NEEDED FOR COUGHING, WHEEZING, OR SHORTNESS OF BREATH, Disp: 20.1 g, Rfl: 1    Review of Systems  Per HPI unless specifically indicated above     Objective:    There were no vitals taken for this visit.  Wt Readings from Last 3 Encounters:  10/03/17 179 lb 8 oz (81.4 kg)  09/11/17 175 lb (79.4 kg)  07/10/17 164 lb 8 oz (74.6 kg)    Physical Exam Constitutional:      General: She is not in acute distress.    Appearance: Normal appearance. She is not ill-appearing.  HENT:     Head: Normocephalic and atraumatic.  Pulmonary:     Effort: Pulmonary effort is normal. No respiratory distress.  Neurological:     Mental Status: She is alert and oriented to person, place, and time.  Psychiatric:        Attention and Perception: Attention normal.        Speech: Speech normal.        Behavior: Behavior is cooperative.     Results for orders placed or performed during the hospital encounter of 07/29/18  Lipid panel  Result Value Ref Range   Cholesterol 211 (H) 0 - 200 mg/dL   Triglycerides 91 <150 mg/dL   HDL 39 (L) >40 mg/dL   Total  CHOL/HDL Ratio 5.4 RATIO   VLDL 18 0 - 40 mg/dL   LDL Cholesterol 154 (H) 0 - 99 mg/dL  Comprehensive metabolic panel  Result Value Ref Range   Sodium 141 135 - 145 mmol/L   Potassium 4.0 3.5 - 5.1 mmol/L   Chloride 110 98 - 111 mmol/L   CO2 22 22 - 32 mmol/L   Glucose, Bld 103 (H) 70 - 99 mg/dL   BUN 14 6 - 20 mg/dL   Creatinine, Ser 0.80 0.44 - 1.00 mg/dL   Calcium 9.0 8.9 - 10.3 mg/dL   Total Protein 7.1 6.5 - 8.1 g/dL   Albumin 4.1 3.5 - 5.0 g/dL   AST 15 15 - 41 U/L   ALT 13 0 - 44 U/L   Alkaline Phosphatase 93 38 - 126 U/L   Total Bilirubin 0.6 0.3 - 1.2 mg/dL   GFR calc non Af Amer >60 >60 mL/min   GFR calc Af Amer >60 >60 mL/min   Anion gap 9 5 - 15      Assessment & Plan:   Encounter Diagnoses  Name Primary?  . Chronic obstructive pulmonary  disease, unspecified COPD type ([place]) Yes  . Hyperlipidemia, unspecified hyperlipidemia type   . Tobacco use disorder   . Gastroesophageal reflux disease, esophagitis presence not specified   . Screening for breast cancer      -reviewed labs with pt.  Counseled pt on lowfat diet -will Mail pt reading information on lowfat diet and GERD diet -pt to continue current medications -will refer for screening Mammogram -encouraged pt to Wear a mask when in public per CDC guidelines -encouraged smoking cessation -pt to follow up 3 months.  She is to contact office sooner prn

## 2018-08-04 NOTE — Patient Instructions (Signed)
High Cholesterol  High cholesterol is a condition in which the blood has high levels of a white, waxy, fat-like substance (cholesterol). The human body needs small amounts of cholesterol. The liver makes all the cholesterol that the body needs. Extra (excess) cholesterol comes from the food that we eat. Cholesterol is carried from the liver by the blood through the blood vessels. If you have high cholesterol, deposits (plaques) may build up on the walls of your blood vessels (arteries). Plaques make the arteries narrower and stiffer. Cholesterol plaques increase your risk for heart attack and stroke. Work with your health care provider to keep your cholesterol levels in a healthy range. What increases the risk? This condition is more likely to develop in people who:  Eat foods that are high in animal fat (saturated fat) or cholesterol.  Are overweight.  Are not getting enough exercise.  Have a family history of high cholesterol. What are the signs or symptoms? There are no symptoms of this condition. How is this diagnosed? This condition may be diagnosed from the results of a blood test.  If you are older than age 20, your health care provider may check your cholesterol every 4-6 years.  You may be checked more often if you already have high cholesterol or other risk factors for heart disease. The blood test for cholesterol measures:  "Bad" cholesterol (LDL cholesterol). This is the main type of cholesterol that causes heart disease. The desired level for LDL is less than 100.  "Good" cholesterol (HDL cholesterol). This type helps to protect against heart disease by cleaning the arteries and carrying the LDL away. The desired level for HDL is 60 or higher.  Triglycerides. These are fats that the body can store or burn for energy. The desired number for triglycerides is lower than 150.  Total cholesterol. This is a measure of the total amount of cholesterol in your blood, including LDL  cholesterol, HDL cholesterol, and triglycerides. A healthy number is less than 200. How is this treated? This condition is treated with diet changes, lifestyle changes, and medicines. Diet changes  This may include eating more whole grains, fruits, vegetables, nuts, and fish.  This may also include cutting back on red meat and foods that have a lot of added sugar. Lifestyle changes  Changes may include getting at least 40 minutes of aerobic exercise 3 times a week. Aerobic exercises include walking, biking, and swimming. Aerobic exercise along with a healthy diet can help you maintain a healthy weight.  Changes may also include quitting smoking. Medicines  Medicines are usually given if diet and lifestyle changes have failed to reduce your cholesterol to healthy levels.  Your health care provider may prescribe a statin medicine. Statin medicines have been shown to reduce cholesterol, which can reduce the risk of heart disease. Follow these instructions at home: Eating and drinking If told by your health care provider:  Eat chicken (without skin), fish, veal, shellfish, ground turkey breast, and round or loin cuts of red meat.  Do not eat fried foods or fatty meats, such as hot dogs and salami.  Eat plenty of fruits, such as apples.  Eat plenty of vegetables, such as broccoli, potatoes, and carrots.  Eat beans, peas, and lentils.  Eat grains such as barley, rice, couscous, and bulgur wheat.  Eat pasta without cream sauces.  Use skim or nonfat milk, and eat low-fat or nonfat yogurt and cheeses.  Do not eat or drink whole milk, cream, ice cream, egg yolks,   or hard cheeses.  Do not eat stick margarine or tub margarines that contain trans fats (also called partially hydrogenated oils).  Do not eat saturated tropical oils, such as coconut oil and palm oil.  Do not eat cakes, cookies, crackers, or other baked goods that contain trans fats.  General instructions  Exercise as  directed by your health care provider. Increase your activity level with activities such as gardening, walking, and taking the stairs.  Take over-the-counter and prescription medicines only as told by your health care provider.  Do not use any products that contain nicotine or tobacco, such as cigarettes and e-cigarettes. If you need help quitting, ask your health care provider.  Keep all follow-up visits as told by your health care provider. This is important. Contact a health care provider if:  You are struggling to maintain a healthy diet or weight.  You need help to start on an exercise program.  You need help to stop smoking. Get help right away if:  You have chest pain.  You have trouble breathing. This information is not intended to replace advice given to you by your health care provider. Make sure you discuss any questions you have with your health care provider. Document Released: 01/08/2005 Document Revised: 01/11/2017 Document Reviewed: 07/09/2015 Elsevier Patient Education  2020 [employer].  --------------------------------------------------   Gastroesophageal Reflux Disease, Adult Gastroesophageal reflux (GER) happens when acid from the stomach flows up into the tube that connects the mouth and the stomach (esophagus). Normally, food travels down the esophagus and stays in the stomach to be digested. However, when a person has GER, food and stomach acid sometimes move back up into the esophagus. If this becomes a more serious problem, the person may be diagnosed with a disease called gastroesophageal reflux disease (GERD). GERD occurs when the reflux:  Happens often.  Causes frequent or severe symptoms.  Causes problems such as damage to the esophagus. When stomach acid comes in contact with the esophagus, the acid may cause soreness (inflammation) in the esophagus. Over time, GERD may create small holes (ulcers) in the lining of the esophagus. What are the causes?  This condition is caused by a problem with the muscle between the esophagus and the stomach (lower esophageal sphincter, or LES). Normally, the LES muscle closes after food passes through the esophagus to the stomach. When the LES is weakened or abnormal, it does not close properly, and that allows food and stomach acid to go back up into the esophagus. The LES can be weakened by certain dietary substances, medicines, and medical conditions, including:  Tobacco use.  Pregnancy.  Having a hiatal hernia.  Alcohol use.  Certain foods and beverages, such as coffee, chocolate, onions, and peppermint. What increases the risk? You are more likely to develop this condition if you:  Have an increased body weight.  Have a connective tissue disorder.  Use NSAID medicines. What are the signs or symptoms? Symptoms of this condition include:  Heartburn.  Difficult or painful swallowing.  The feeling of having a lump in the throat.  Abitter taste in the mouth.  Bad breath.  Having a large amount of saliva.  Having an upset or bloated stomach.  Belching.  Chest pain. Different conditions can cause chest pain. Make sure you see your health care provider if you experience chest pain.  Shortness of breath or wheezing.  Ongoing (chronic) cough or a night-time cough.  Wearing away of tooth enamel.  Weight loss. How is this diagnosed? Your  health care provider will take a medical history and perform a physical exam. To determine if you have mild or severe GERD, your health care provider may also monitor how you respond to treatment. You may also have tests, including:  A test to examine your stomach and esophagus with a small camera (endoscopy).  A test thatmeasures the acidity level in your esophagus.  A test thatmeasures how much pressure is on your esophagus.  A barium swallow or modified barium swallow test to show the shape, size, and functioning of your esophagus. How is  this treated? The goal of treatment is to help relieve your symptoms and to prevent complications. Treatment for this condition may vary depending on how severe your symptoms are. Your health care provider may recommend:  Changes to your diet.  Medicine.  Surgery. Follow these instructions at home: Eating and drinking   Follow a diet as recommended by your health care provider. This may involve avoiding foods and drinks such as: ? Coffee and tea (with or without caffeine). ? Drinks that containalcohol. ? Energy drinks and sports drinks. ? Carbonated drinks or sodas. ? Chocolate and cocoa. ? Peppermint and mint flavorings. ? Garlic and onions. ? Horseradish. ? Spicy and acidic foods, including peppers, chili powder, curry powder, vinegar, hot sauces, and barbecue sauce. ? Citrus fruit juices and citrus fruits, such as oranges, lemons, and limes. ? Tomato-based foods, such as red sauce, chili, salsa, and pizza with red sauce. ? Fried and fatty foods, such as donuts, french fries, potato chips, and high-fat dressings. ? High-fat meats, such as hot dogs and fatty cuts of red and white meats, such as rib eye steak, sausage, ham, and bacon. ? High-fat dairy items, such as whole milk, butter, and cream cheese.  Eat small, frequent meals instead of large meals.  Avoid drinking large amounts of liquid with your meals.  Avoid eating meals during the 2-3 hours before bedtime.  Avoid lying down right after you eat.  Do not exercise right after you eat. Lifestyle   Do not use any products that contain nicotine or tobacco, such as cigarettes, e-cigarettes, and chewing tobacco. If you need help quitting, ask your health care provider.  Try to reduce your stress by using methods such as yoga or meditation. If you need help reducing stress, ask your health care provider.  If you are overweight, reduce your weight to an amount that is healthy for you. Ask your health care provider for  guidance about a safe weight loss goal. General instructions  Pay attention to any changes in your symptoms.  Take over-the-counter and prescription medicines only as told by your health care provider. Do not take aspirin, ibuprofen, or other NSAIDs unless your health care provider told you to do so.  Wear loose-fitting clothing. Do not wear anything tight around your waist that causes pressure on your abdomen.  Raise (elevate) the head of your bed about 6 inches (15 cm).  Avoid bending over if this makes your symptoms worse.  Keep all follow-up visits as told by your health care provider. This is important. Contact a health care provider if:  You have: ? New symptoms. ? Unexplained weight loss. ? Difficulty swallowing or it hurts to swallow. ? Wheezing or a persistent cough. ? A hoarse voice.  Your symptoms do not improve with treatment. Get help right away if you:  Have pain in your arms, neck, jaw, teeth, or back.  Feel sweaty, dizzy, or light-headed.  Have chest  pain or shortness of breath.  Vomit and your vomit looks like blood or coffee grounds.  Faint.  Have stool that is bloody or black.  Cannot swallow, drink, or eat. Summary  Gastroesophageal reflux happens when acid from the stomach flows up into the esophagus. GERD is a disease in which the reflux happens often, causes frequent or severe symptoms, or causes problems such as damage to the esophagus.  Treatment for this condition may vary depending on how severe your symptoms are. Your health care provider may recommend diet and lifestyle changes, medicine, or surgery.  Contact a health care provider if you have new or worsening symptoms.  Take over-the-counter and prescription medicines only as told by your health care provider. Do not take aspirin, ibuprofen, or other NSAIDs unless your health care provider told you to do so.  Keep all follow-up visits as told by your health care provider. This is  important. This information is not intended to replace advice given to you by your health care provider. Make sure you discuss any questions you have with your health care provider. Document Released: 10/18/2004 Document Revised: 07/17/2017 Document Reviewed: 07/17/2017 Elsevier Patient Education  2020 [employer].

## 2018-08-11 ENCOUNTER — Encounter: Payer: Self-pay | Admitting: Physician Assistant

## 2018-08-11 ENCOUNTER — Ambulatory Visit: Payer: Self-pay | Admitting: Physician Assistant

## 2018-08-11 DIAGNOSIS — R0602 Shortness of breath: Secondary | ICD-10-CM

## 2018-08-11 DIAGNOSIS — R42 Dizziness and giddiness: Secondary | ICD-10-CM

## 2018-08-11 DIAGNOSIS — R5383 Other fatigue: Secondary | ICD-10-CM

## 2018-08-11 DIAGNOSIS — Z20822 Contact with and (suspected) exposure to covid-19: Secondary | ICD-10-CM

## 2018-08-11 NOTE — Progress Notes (Signed)
There were no vitals taken for this visit.   Subjective:    Patient ID: Pamela Livingston, female    DOB: 05-18-1965, 53 y.o.   MRN: [id number]  HPI: Pamela Livingston is a 53 y.o. female presenting on 08/11/2018 for No chief complaint on file.   HPI    This is a telemedicine appointment through [employer] due to coronavirus pandemic.  I connected with  Pamela Livingston on 08/11/18 by a video enabled telemedicine application and verified that I am speaking with the correct person using two identifiers.   I discussed the limitations of evaluation and management by telemedicine. The patient expressed understanding and agreed to proceed.  Pt is at home.  Provider is at office    Pt says she [place] to the flea market and grocery yesterday.  She says she was Feeling faint last night.  Today she is feeling funny.  Also her heartburn is acting up and she had some acidic vomitus.     She is feeling jittery and nervous.  She says she was wearing a mask yesterday.  No known exposure.     She is also having some sob.  She is not aware of any fever.       Relevant past medical, surgical, family and social history reviewed and updated as indicated. Interim medical history since our last visit reviewed. Allergies and medications reviewed and updated.   Current Outpatient Medications:  .  albuterol (PROVENTIL) (2.5 MG/3ML) 0.083% nebulizer solution, INHALE 1 VIAL VIA NEBULIZER EVERY 6 HOURS AS NEEDED FOR WHEEZING OR SHORTNESS OF BREATH, Disp: 225 mL, Rfl: 0 .  atorvastatin (LIPITOR) 20 MG tablet, TAKE 1 Tablet BY MOUTH ONCE DAILY, Disp: 90 tablet, Rfl: 1 .  diphenhydrAMINE (BENADRYL) 25 MG tablet, Take 50 mg by mouth at bedtime. , Disp: , Rfl:  .  DULERA 200-5 MCG/ACT AERO, INHALE 2 PUFFS BY MOUTH TWICE DAILY. RINSE MOUTH AFTER EACH USE, Disp: 39 g, Rfl: 1 .  loratadine-pseudoephedrine (CLARITIN-D 24-HOUR) 10-240 MG 24 hr tablet, Take 1 tablet by mouth daily., Disp: , Rfl:  .  montelukast (SINGULAIR) 10 MG tablet,  TAKE 1 Tablet BY MOUTH EVERY DAY AS NEEDED, Disp: 90 tablet, Rfl: 0 .  omeprazole (PRILOSEC) 40 MG capsule, TAKE 1 Capsule BY MOUTH ONCE DAILY, Disp: 90 capsule, Rfl: 1 .  PROVENTIL HFA 108 (90 Base) MCG/ACT inhaler, INHALE 2 PUFFS BY MOUTH EVERY 6 HOURS AS NEEDED FOR COUGHING, WHEEZING, OR SHORTNESS OF BREATH, Disp: 20.1 g, Rfl: 1    Review of Systems  Per HPI unless specifically indicated above     Objective:    There were no vitals taken for this visit.  Wt Readings from Last 3 Encounters:  10/03/17 179 lb 8 oz (81.4 kg)  09/11/17 175 lb (79.4 kg)  07/10/17 164 lb 8 oz (74.6 kg)    Physical Exam Constitutional:      General: She is not in acute distress.    Appearance: Normal appearance. She is not ill-appearing.  HENT:     Head: Normocephalic and atraumatic.  Pulmonary:     Effort: Pulmonary effort is normal. No respiratory distress.  Neurological:     Mental Status: She is alert and oriented to person, place, and time.  Psychiatric:        Attention and Perception: Attention normal.        Speech: Speech normal.        Behavior: Behavior is cooperative.  Assessment & Plan:   Encounter Diagnoses  Name Primary?  . Feeling faint Yes  . Fatigue, unspecified type   . SOB (shortness of breath)   . Suspected 2019 Novel Coronavirus Infection      -will refer for covid 19 testing -discussed with pt self-isolation until results back -discussed that she should avoid crowded places in currrent environment to reduce risk from covid 19 -she is to rest, drink plenty of fluids, use her inhaler as needed.  -she is to follow up as scheduled.  She is to contact office sooner for any new symptoms or worsening

## 2018-08-12 ENCOUNTER — Other Ambulatory Visit: Payer: Self-pay | Admitting: Physician Assistant

## 2018-08-12 DIAGNOSIS — Z1239 Encounter for other screening for malignant neoplasm of breast: Secondary | ICD-10-CM

## 2018-08-28 ENCOUNTER — Ambulatory Visit (HOSPITAL_COMMUNITY)
Admission: RE | Admit: 2018-08-28 | Discharge: 2018-08-28 | Disposition: A | Discharge status: Home / Self Care | Payer: Self-pay | Source: Ambulatory Visit | Attending: Physician Assistant | Admitting: Physician Assistant

## 2018-08-28 ENCOUNTER — Other Ambulatory Visit: Payer: Self-pay

## 2018-08-28 DIAGNOSIS — Z1239 Encounter for other screening for malignant neoplasm of breast: Secondary | ICD-10-CM

## 2018-09-08 ENCOUNTER — Ambulatory Visit: Payer: Self-pay | Admitting: Physician Assistant

## 2018-09-08 ENCOUNTER — Encounter: Payer: Self-pay | Admitting: Physician Assistant

## 2018-09-08 DIAGNOSIS — R0989 Other specified symptoms and signs involving the circulatory and respiratory systems: Secondary | ICD-10-CM

## 2018-09-08 DIAGNOSIS — J449 Chronic obstructive pulmonary disease, unspecified: Secondary | ICD-10-CM

## 2018-09-08 DIAGNOSIS — K219 Gastro-esophageal reflux disease without esophagitis: Secondary | ICD-10-CM

## 2018-09-08 DIAGNOSIS — F17200 Nicotine dependence, unspecified, uncomplicated: Secondary | ICD-10-CM

## 2018-09-08 DIAGNOSIS — E785 Hyperlipidemia, unspecified: Secondary | ICD-10-CM

## 2018-09-08 DIAGNOSIS — I10 Essential (primary) hypertension: Secondary | ICD-10-CM

## 2018-09-08 MED ORDER — LISINOPRIL 10 MG PO TABS: 10.0000 mg | ORAL_TABLET | Freq: Every day | ORAL | 1 refills | Status: DC

## 2018-09-08 NOTE — Progress Notes (Signed)
There were no vitals taken for this visit.   Subjective:    Patient ID: Pamela Livingston, female    DOB: Nov 10, 1965, 53 y.o.   MRN: [id number]  HPI: Pamela Livingston is a 53 y.o. female presenting on 09/08/2018 for No chief complaint on file.   HPI    This is a telemedicine appointment due to coronavirus pandemic.  It is via telephone as pt does not have a video enabled device or wifi.   I connected with  TEIGHLOR KORSON on 09/08/18 by a video enabled telemedicine application and verified that I am speaking with the correct person using two identifiers.   I discussed the limitations of evaluation and management by telemedicine. The patient expressed understanding and agreed to proceed.  Pt is at home.  Provider is at office   Pt monitors her BP at home.   Yesterday 151 /85   She checked her bp last Tuesday because she "felt bad" and it was 222/126.   She says she had a HA at the time and her fingers were tingling.  She says she took a tylenol but that didn't help and she had a HA for about 3 days.  She says the HA has gone away now.    She says the bp did not stay up but was "creeping down" and was "180 something" on Wednesday.   She denies hemiparesis.  She says her cousin told her she was having slurred speech at the time when her BP was 222/126.  She says it has gone back to normal.    Pt says she knows she should have gone to the ER for possible CVA but she was afraid she would get the coronavirus if she went to the ER.    Her breathing has been good  She is taking tylenol because her left side has been hurting due to rain.  This has been going on for years.    Pt says she has stress- her daughter has been gone for 1 month (the daughter is on heroin)  Pt did not get coronavirus test that was ordered 08/11/18.  She says she didn't have a ride to get the test done but did have a ride to go to the flea market.    She is still smoking.    Relevant past medical, surgical, family and  social history reviewed and updated as indicated. Interim medical history since our last visit reviewed. Allergies and medications reviewed and updated.   Current Outpatient Medications:  .  acetaminophen (TYLENOL) 500 MG tablet, Take 500 mg by mouth every 6 (six) hours as needed., Disp: , Rfl:  .  atorvastatin (LIPITOR) 20 MG tablet, TAKE 1 Tablet BY MOUTH ONCE DAILY, Disp: 90 tablet, Rfl: 1 .  diphenhydrAMINE (BENADRYL) 25 MG tablet, Take 50 mg by mouth at bedtime. , Disp: , Rfl:  .  DULERA 200-5 MCG/ACT AERO, INHALE 2 PUFFS BY MOUTH TWICE DAILY. RINSE MOUTH AFTER EACH USE, Disp: 39 g, Rfl: 1 .  loratadine-pseudoephedrine (CLARITIN-D 24-HOUR) 10-240 MG 24 hr tablet, Take 1 tablet by mouth daily., Disp: , Rfl:  .  montelukast (SINGULAIR) 10 MG tablet, TAKE 1 Tablet BY MOUTH EVERY DAY AS NEEDED, Disp: 90 tablet, Rfl: 0 .  omeprazole (PRILOSEC) 40 MG capsule, TAKE 1 Capsule BY MOUTH ONCE DAILY, Disp: 90 capsule, Rfl: 1 .  albuterol (PROVENTIL) (2.5 MG/3ML) 0.083% nebulizer solution, INHALE 1 VIAL VIA NEBULIZER EVERY 6 HOURS AS NEEDED FOR WHEEZING OR SHORTNESS  OF BREATH (Patient not taking: Reported on 09/08/2018), Disp: 225 mL, Rfl: 0 .  PROVENTIL HFA 108 (90 Base) MCG/ACT inhaler, INHALE 2 PUFFS BY MOUTH EVERY 6 HOURS AS NEEDED FOR COUGHING, WHEEZING, OR SHORTNESS OF BREATH (Patient not taking: Reported on 09/08/2018), Disp: 20.1 g, Rfl: 1    Review of Systems  Per HPI unless specifically indicated above     Objective:    There were no vitals taken for this visit.  Wt Readings from Last 3 Encounters:  10/03/17 179 lb 8 oz (81.4 kg)  09/11/17 175 lb (79.4 kg)  07/10/17 164 lb 8 oz (74.6 kg)    Physical Exam Pulmonary:     Effort: No respiratory distress.  Neurological:     Mental Status: She is alert and oriented to person, place, and time.  Psychiatric:        Attention and Perception: Attention normal.        Speech: Speech normal.        Behavior: Behavior is cooperative.         Thought Content: Thought content normal.     Results for orders placed or performed during the hospital encounter of 07/29/18  Lipid panel  Result Value Ref Range   Cholesterol 211 (H) 0 - 200 mg/dL   Triglycerides 91 <150 mg/dL   HDL 39 (L) >40 mg/dL   Total CHOL/HDL Ratio 5.4 RATIO   VLDL 18 0 - 40 mg/dL   LDL Cholesterol 154 (H) 0 - 99 mg/dL  Comprehensive metabolic panel  Result Value Ref Range   Sodium 141 135 - 145 mmol/L   Potassium 4.0 3.5 - 5.1 mmol/L   Chloride 110 98 - 111 mmol/L   CO2 22 22 - 32 mmol/L   Glucose, Bld 103 (H) 70 - 99 mg/dL   BUN 14 6 - 20 mg/dL   Creatinine, Ser 0.80 0.44 - 1.00 mg/dL   Calcium 9.0 8.9 - 10.3 mg/dL   Total Protein 7.1 6.5 - 8.1 g/dL   Albumin 4.1 3.5 - 5.0 g/dL   AST 15 15 - 41 U/L   ALT 13 0 - 44 U/L   Alkaline Phosphatase 93 38 - 126 U/L   Total Bilirubin 0.6 0.3 - 1.2 mg/dL   GFR calc non Af Amer >60 >60 mL/min   GFR calc Af Amer >60 >60 mL/min   Anion gap 9 5 - 15      Assessment & Plan:    Encounter Diagnoses  Name Primary?  . Essential hypertension Yes  . Suspected cerebrovascular accident (CVA)   . Hyperlipidemia, unspecified hyperlipidemia type   . Chronic obstructive pulmonary disease, unspecified COPD type ([place])   . Tobacco use disorder   . Gastroesophageal reflux disease, esophagitis presence not specified      She says she knew she should go to the ER due to possible CVA but she was afraid of the CoVid 19.  Discussed with pt that if these symptoms return, she should go to the ER.  Discussed with pt that risks from untreated CVA are much greater than the possibility of contracting covid 19 while at the hospital.  Pt is allergic to aspirin so will not start that.  Will Start bp medication- lisinopril   Will get MRI - head  Will Mail application for [facility] to the pt  Pt to continue to monitor her bp daily.  She is to notify office if it goes higher or lower than 431 systolic.  Encouraged  smoking cessation  She is to follow up in the office in one month.  She is to contact office sooner prn new symptoms or if she starts feeling bad again.

## 2018-09-12 ENCOUNTER — Other Ambulatory Visit: Payer: Self-pay | Admitting: Physician Assistant

## 2018-09-12 ENCOUNTER — Ambulatory Visit (HOSPITAL_COMMUNITY)
Admission: RE | Admit: 2018-09-12 | Discharge: 2018-09-12 | Disposition: A | Discharge status: Home / Self Care | Payer: Self-pay | Source: Ambulatory Visit | Attending: Physician Assistant | Admitting: Physician Assistant

## 2018-09-12 ENCOUNTER — Other Ambulatory Visit: Payer: Self-pay

## 2018-09-12 DIAGNOSIS — R0989 Other specified symptoms and signs involving the circulatory and respiratory systems: Secondary | ICD-10-CM

## 2018-09-12 DIAGNOSIS — I10 Essential (primary) hypertension: Secondary | ICD-10-CM

## 2018-09-15 ENCOUNTER — Other Ambulatory Visit: Payer: Self-pay | Admitting: Physician Assistant

## 2018-09-23 ENCOUNTER — Ambulatory Visit (HOSPITAL_COMMUNITY)
Admission: RE | Admit: 2018-09-23 | Discharge: 2018-09-23 | Disposition: A | Discharge status: Home / Self Care | Payer: Self-pay | Source: Ambulatory Visit | Attending: Physician Assistant | Admitting: Physician Assistant

## 2018-09-23 ENCOUNTER — Other Ambulatory Visit: Payer: Self-pay

## 2018-09-23 DIAGNOSIS — I10 Essential (primary) hypertension: Secondary | ICD-10-CM | POA: Insufficient documentation

## 2018-09-23 DIAGNOSIS — R0989 Other specified symptoms and signs involving the circulatory and respiratory systems: Secondary | ICD-10-CM | POA: Insufficient documentation

## 2018-10-01 ENCOUNTER — Other Ambulatory Visit: Payer: Self-pay

## 2018-10-01 ENCOUNTER — Encounter: Payer: Self-pay | Admitting: Physician Assistant

## 2018-10-01 ENCOUNTER — Ambulatory Visit: Payer: Self-pay | Admitting: Physician Assistant

## 2018-10-01 ENCOUNTER — Ambulatory Visit (HOSPITAL_COMMUNITY)
Admission: RE | Admit: 2018-10-01 | Discharge: 2018-10-01 | Disposition: A | Discharge status: Home / Self Care | Payer: Self-pay | Source: Ambulatory Visit | Attending: Physician Assistant | Admitting: Physician Assistant

## 2018-10-01 VITALS — BP 116/80 | HR 84 | Temp 98.1°F | Wt 179.8 lb

## 2018-10-01 DIAGNOSIS — J449 Chronic obstructive pulmonary disease, unspecified: Secondary | ICD-10-CM | POA: Insufficient documentation

## 2018-10-01 DIAGNOSIS — R079 Chest pain, unspecified: Secondary | ICD-10-CM

## 2018-10-01 DIAGNOSIS — I10 Essential (primary) hypertension: Secondary | ICD-10-CM

## 2018-10-01 DIAGNOSIS — F17200 Nicotine dependence, unspecified, uncomplicated: Secondary | ICD-10-CM

## 2018-10-01 DIAGNOSIS — E785 Hyperlipidemia, unspecified: Secondary | ICD-10-CM

## 2018-10-01 NOTE — Patient Instructions (Signed)
Coping with Quitting Smoking  Quitting smoking is a physical and mental challenge. You will face cravings, withdrawal symptoms, and temptation. Before quitting, work with your health care provider to make a plan that can help you cope. Preparation can help you quit and keep you from giving in. How can I cope with cravings? Cravings usually last for 5-10 minutes. If you get through it, the craving will pass. Consider taking the following actions to help you cope with cravings:  Keep your mouth busy: ? Chew sugar-free gum. ? Suck on hard candies or a straw. ? Brush your teeth.  Keep your hands and body busy: ? Immediately change to a different activity when you feel a craving. ? Squeeze or play with a ball. ? Do an activity or a hobby, like making bead jewelry, practicing needlepoint, or working with wood. ? Mix up your normal routine. ? Take a short exercise break. Go for a quick walk or run up and down stairs. ? Spend time in public places where smoking is not allowed.  Focus on doing something kind or helpful for someone else.  Call a friend or family member to talk during a craving.  Join a support group.  Call a quit line, such as 1-800-QUIT-NOW.  Talk with your health care provider about medicines that might help you cope with cravings and make quitting easier for you. How can I deal with withdrawal symptoms? Your body may experience negative effects as it tries to get used to not having nicotine in the system. These effects are called withdrawal symptoms. They may include:  Feeling hungrier than normal.  Trouble concentrating.  Irritability.  Trouble sleeping.  Feeling depressed.  Restlessness and agitation.  Craving a cigarette. To manage withdrawal symptoms:  Avoid places, people, and activities that trigger your cravings.  Remember why you want to quit.  Get plenty of sleep.  Avoid coffee and other caffeinated drinks. These may worsen some of your symptoms.  How can I handle social situations? Social situations can be difficult when you are quitting smoking, especially in the first few weeks. To manage this, you can:  Avoid parties, bars, and other social situations where people might be smoking.  Avoid alcohol.  Leave right away if you have the urge to smoke.  Explain to your family and friends that you are quitting smoking. Ask for understanding and support.  Plan activities with friends or family where smoking is not an option. What are some ways I can cope with stress? Wanting to smoke may cause stress, and stress can make you want to smoke. Find ways to manage your stress. Relaxation techniques can help. For example:  Breathe slowly and deeply, in through your nose and out through your mouth.  Listen to soothing, relaxing music.  Talk with a family member or friend about your stress.  Light a candle.  Soak in a bath or take a shower.  Think about a peaceful place. What are some ways I can prevent weight gain? Be aware that many people gain weight after they quit smoking. However, not everyone does. To keep from gaining weight, have a plan in place before you quit and stick to the plan after you quit. Your plan should include:  Having healthy snacks. When you have a craving, it may help to: ? Eat plain popcorn, crunchy carrots, celery, or other cut vegetables. ? Chew sugar-free gum.  Changing how you eat: ? Eat small portion sizes at meals. ? Eat 4-6 small meals   throughout the day instead of 1-2 large meals a day. ? Be mindful when you eat. Do not watch television or do other things that might distract you as you eat.  Exercising regularly: ? Make time to exercise each day. If you do not have time for a long workout, do short bouts of exercise for 5-10 minutes several times a day. ? Do some form of strengthening exercise, like weight lifting, and some form of aerobic exercise, like running or swimming.  Drinking plenty of  water or other low-calorie or no-calorie drinks. Drink 6-8 glasses of water daily, or as much as instructed by your health care provider. Summary  Quitting smoking is a physical and mental challenge. You will face cravings, withdrawal symptoms, and temptation to smoke again. Preparation can help you as you go through these challenges.  You can cope with cravings by keeping your mouth busy (such as by chewing gum), keeping your body and hands busy, and making calls to family, friends, or a helpline for people who want to quit smoking.  You can cope with withdrawal symptoms by avoiding places where people smoke, avoiding drinks with caffeine, and getting plenty of rest.  Ask your health care provider about the different ways to prevent weight gain, avoid stress, and handle social situations. This information is not intended to replace advice given to you by your health care provider. Make sure you discuss any questions you have with your health care provider. Document Released: 01/06/2016 Document Revised: 12/21/2016 Document Reviewed: 01/06/2016 Elsevier Patient Education  2020 [employer]  

## 2018-10-01 NOTE — Progress Notes (Signed)
BP 116/80   Pulse 84   Temp 98.1 F (36.7 C)   Wt 179 lb 12.8 oz (81.6 kg)   SpO2 95%   BMI 35.71 kg/m    Subjective:    Patient ID: Pamela Livingston, female    DOB: 1965/12/23, 53 y.o.   MRN: [id number]  HPI: Pamela Livingston is a 53 y.o. female presenting on 10/01/2018 for Hypertension (pt states she has a bp monitor at home. pt last checked her bp at home yesterday and it was 125/85) and Follow-up (review MRI)   HPI   Pt had negative covid 19 screening questionnaire   At previous appointment on 09/08/18 pt related episode that sounded like TIA versus CVA.  Pt had MRI done since that appointment.  She is still smoking.   She says she has sob at times but mostly it is doing okay except when she gets pains in her left chest.   She says she is sometimes having pains on left chest when she walks.  She also gets it sometimes when she isn't walking.  When the pain comes it lasts 5-10 minutes and sometimes longer.  She gets SOB when she gets the CP.    She also gets light headed but does not get nauseous.    Pt says her stress level improved.  She heard from her daughter (who is heroin addict and "disappears" for extended amounts of time)  Pt says her L leg pain is bothering her and interferes with her walking.   This is not a new thing for her but she says it seems to be bothering her a lot, especially her walking.   Relevant past medical, surgical, family and social history reviewed and updated as indicated. Interim medical history since our last visit reviewed. Allergies and medications reviewed and updated.    Current Outpatient Medications:  .  acetaminophen (TYLENOL) 500 MG tablet, Take 500 mg by mouth every 6 (six) hours as needed., Disp: , Rfl:  .  albuterol (PROVENTIL) (2.5 MG/3ML) 0.083% nebulizer solution, INHALE 1 VIAL VIA NEBULIZER EVERY 6 HOURS AS NEEDED FOR WHEEZING OR SHORTNESS OF BREATH, Disp: 225 mL, Rfl: 0 .  atorvastatin (LIPITOR) 20 MG tablet, TAKE 1 Tablet BY MOUTH  EVERY DAY, Disp: 90 tablet, Rfl: 1 .  diphenhydrAMINE (BENADRYL) 25 MG tablet, Take 50 mg by mouth at bedtime. , Disp: , Rfl:  .  DULERA 200-5 MCG/ACT AERO, INHALE 2 PUFFS BY MOUTH TWICE DAILY. RINSE MOUTH AFTER EACH USE, Disp: 39 g, Rfl: 1 .  lisinopril (ZESTRIL) 10 MG tablet, Take 1 tablet (10 mg total) by mouth daily., Disp: 30 tablet, Rfl: 1 .  loratadine-pseudoephedrine (CLARITIN-D 24-HOUR) 10-240 MG 24 hr tablet, Take 1 tablet by mouth as needed. , Disp: , Rfl:  .  montelukast (SINGULAIR) 10 MG tablet, TAKE 1 Tablet BY MOUTH EVERY DAY AS NEEDED (Patient taking differently: Take 10 mg by mouth daily. ), Disp: 90 tablet, Rfl: 0 .  omeprazole (PRILOSEC) 40 MG capsule, TAKE 1 Capsule BY MOUTH EVERY DAY, Disp: 90 capsule, Rfl: 1 .  PROVENTIL HFA 108 (90 Base) MCG/ACT inhaler, INHALE 2 PUFFS BY MOUTH EVERY 6 HOURS AS NEEDED FOR COUGHING, WHEEZING, OR SHORTNESS OF BREATH, Disp: 20.1 g, Rfl: 1    Review of Systems  Per HPI unless specifically indicated above     Objective:    BP 116/80   Pulse 84   Temp 98.1 F (36.7 C)   Wt 179 lb 12.8 oz (  81.6 kg)   SpO2 95%   BMI 35.71 kg/m   Wt Readings from Last 3 Encounters:  10/01/18 179 lb 12.8 oz (81.6 kg)  10/03/17 179 lb 8 oz (81.4 kg)  09/11/17 175 lb (79.4 kg)    Physical Exam Vitals signs reviewed.  Constitutional:      General: She is not in acute distress.    Appearance: Normal appearance. She is well-developed. She is obese. She is not ill-appearing.  HENT:     Head: Normocephalic and atraumatic.  Neck:     Musculoskeletal: Neck supple.  Cardiovascular:     Rate and Rhythm: Normal rate and regular rhythm.     Pulses: Normal pulses.  Pulmonary:     Effort: Pulmonary effort is normal.     Breath sounds: Normal breath sounds.  Abdominal:     General: Bowel sounds are normal.     Palpations: Abdomen is soft. There is no mass.     Tenderness: There is no abdominal tenderness.  Musculoskeletal:     Right lower leg: No  edema.     Left lower leg: No edema.  Lymphadenopathy:     Cervical: No cervical adenopathy.  Skin:    General: Skin is warm and dry.  Neurological:     Mental Status: She is alert and oriented to person, place, and time.     Gait: Gait abnormal.  Psychiatric:        Attention and Perception: Attention normal.        Speech: Speech normal.        Behavior: Behavior normal. Behavior is cooperative.       EKG- sinus rhythm at 70bpm.  No changes compared with EKG done 05/02/2017     Assessment & Plan:   Encounter Diagnoses  Name Primary?  . Essential hypertension Yes  . Chest pain, unspecified type   . Tobacco use disorder   . Hyperlipidemia, unspecified hyperlipidemia type   . Chronic obstructive pulmonary disease, unspecified COPD type ([place])       -reviewed MRI results with pt  "IMPRESSION: No acute infarct  Moderate for age white matter changes most compatible with chronic microvascular ischemia."    Discussed likely TIA - episode (as discussed at appointment on 09/08/18).  Pt unable to take ASA.   Discussed with pt best ways to reduce risk of stroke are with smoking cessation and better control of lipids.  Also need to control BP   -No medication changes today except NTG  -will Get cxr and refer to cardiology for evaluation of chest pain and possible stress test.    -had long conversation about risk and lifestyle.  She is suspected to have had TIA and is possibly having angina.  Discussed that her risk for having a CVA or MI are significant if she doesn't change her lifestyle.  Discussed smoking cessation, lowfat diet and lipid lowering (will increase statin if still elevated when checked next month), increasing exercise (discussed that she is not to start this until after seen by cardiology) and weight reduction.  Pt states understanding  -pt to Follow up next month as scheduled.  She is to contact office sooner prn worsening or changes.  Discussed that she should  go to ER for CP that lasts more than a few minutes  -discussed with pt will address back and LE pain after getting CP addressed and she agrees

## 2018-10-02 MED ORDER — NITROGLYCERIN 0.4 MG SL SUBL: 0.4000 mg | SUBLINGUAL_TABLET | SUBLINGUAL | 99 refills | Status: AC | PRN

## 2018-10-20 ENCOUNTER — Ambulatory Visit: Payer: Self-pay | Admitting: Physician Assistant

## 2018-10-30 NOTE — Progress Notes (Signed)
CARDIOLOGY CONSULT NOTE       Patient ID: Pamela Livingston MRN: [id number] DOB/AGE: 1965/04/23 53 y.o.  Admit date: (Not on file) Referring Physician: Soyla Dryer PA-C Primary Physician: Soyla Dryer, PA-C Primary Cardiologist: New Reason for Consultation: Chest Pain   Active Problems:   * No active hospital problems. *   HPI:  53 y.o. smoker with COPD, HTN, HLD referred for chest pain She has poor lifestyle including her smoking Has had steroid taper in April for her COPD and multiple somatic complaints including dizziness ? TIA MRI only  With moderate white matter changes advanced for age with chronic microvascular ischemia. CXR 10/01/18 NAD LDL elevated at 154 prescribed lipitor 20 mg 09/14/18  She is allergic to ASA Last ECG in Epic 05/02/17 is normal SR rate 66  Sometimes complains of pains in left side of chest when she walks Lasts 5-10 minutes Associated with dyspnea Some stress Daughter is heroin addict and does not keep in touch much August had bad headache for 3 days with malaise and cousin thought her speech was slurred but MRI non acute   Pain is atypical not always exertional  BP up a bit in office   ROS All other systems reviewed and negative except as noted above  Past Medical History:  Diagnosis Date  . Asthma   . Chronic pelvic pain in female   . COPD (chronic obstructive pulmonary disease) ([place])   . Endometriosis   . Polysubstance abuse ([facility])    marijuana, cocaine, benzos  . Stomach ulcer   . Suicide attempt ([place]) 2008   by phenergan overdose  . Syncope     Family History  Problem Relation Age of Onset  . Heart disease Mother   . Hypertension Mother   . Diabetes Mother   . Hypertension Father   . Heart attack Maternal Uncle   . COPD Paternal Grandfather   . Heart attack Maternal Uncle     Social History   Socioeconomic History  . Marital status: Legally Separated    Spouse name: Not on file  . Number of children: Not on file  . Years of  education: Not on file  . Highest education level: Not on file  Occupational History  . Not on file  Social Needs  . Financial resource strain: Not on file  . Food insecurity    Worry: Not on file    Inability: Not on file  . Transportation needs    Medical: Not on file    Non-medical: Not on file  Tobacco Use  . Smoking status: Current Every Day Smoker    Packs/day: 0.50    Years: 27.00    Pack years: 13.50    Types: Cigarettes  . Smokeless tobacco: Never Used  Substance and Sexual Activity  . Alcohol use: Not Currently    Comment: occasional  . Drug use: No    Types: Marijuana, Cocaine, "Crack" cocaine    Comment: past use QUIT DATE 04-2008 (UDS positive 02/2015)  . Sexual activity: Yes    Birth control/protection: Surgical  Lifestyle  . Physical activity    Days per week: Not on file    Minutes per session: Not on file  . Stress: Not on file  Relationships  . Social Herbalist on phone: Not on file    Gets together: Not on file    Attends religious service: Not on file    Active member of club or organization: Not on file  Attends meetings of clubs or organizations: Not on file    Relationship status: Not on file  . Intimate partner violence    Fear of current or ex partner: Not on file    Emotionally abused: Not on file    Physically abused: Not on file    Forced sexual activity: Not on file  Other Topics Concern  . Not on file  Social History Narrative  . Not on file    Past Surgical History:  Procedure Laterality Date  . ABDOMINAL HYSTERECTOMY    . CERVICAL CONE BIOPSY    . CESAREAN SECTION  1993  . CHOLECYSTECTOMY    . RADICAL HYSTERECTOMY WITH TRANSPOSITION OF OVARIES    . TUBAL LIGATION  1993        Physical Exam: Blood pressure (!) 199/82, pulse 80, temperature (!) 97.3 F (36.3 C), temperature source Temporal, height 4\' 11"  (1.499 m), weight 182 lb (82.6 kg), SpO2 100 %.    Affect appropriate Healthy:  appears stated age  53: normal Neck supple with no adenopathy JVP normal no bruits no thyromegaly Lungs clear with no wheezing and good diaphragmatic motion Heart:  S1/S2 no murmur, no rub, gallop or click PMI normal Abdomen: benighn, BS positve, no tenderness, no AAA no bruit.  No HSM or HJR Distal pulses intact with no bruits No edema Neuro non-focal Skin warm and dry No muscular weakness   Labs:   Lab Results  Component Value Date   WBC 7.3 06/19/2017   HGB 13.3 06/19/2017   HCT 40.2 06/19/2017   MCV 96.4 06/19/2017   PLT 274 06/19/2017   No results for input(s): NA, K, CL, CO2, BUN, CREATININE, CALCIUM, PROT, BILITOT, ALKPHOS, ALT, AST, GLUCOSE in the last 168 hours.  Invalid input(s): LABALBU Lab Results  Component Value Date   CKTOTAL 56 11/10/2008   CKMB 0.6 11/10/2008   TROPONINI <0.03 11/21/2016    Lab Results  Component Value Date   CHOL 211 (H) 07/29/2018   CHOL 204 (H) 01/29/2018   CHOL 146 09/26/2017   Lab Results  Component Value Date   HDL 39 (L) 07/29/2018   HDL 44 01/29/2018   HDL 49 09/26/2017   Lab Results  Component Value Date   LDLCALC 154 (H) 07/29/2018   LDLCALC 134 (H) 01/29/2018   LDLCALC 77 09/26/2017   Lab Results  Component Value Date   TRIG 91 07/29/2018   TRIG 129 01/29/2018   TRIG 102 09/26/2017   Lab Results  Component Value Date   CHOLHDL 5.4 07/29/2018   CHOLHDL 4.6 01/29/2018   CHOLHDL 3.0 09/26/2017   No results found for: LDLDIRECT    Radiology: No results found.  EKG: SR rate 66 normal    ASSESSMENT AND PLAN:   1. Chest Pain atypical CRF;s smoking , HlD and HTN f/u Lexiscan myovue  2. Dyspnea: related to smoking and COPD/Asthma continue inhalers estimate EF on myovue  3. HTN:  Increase lisinopril to 20 mg daily f/u primary at [facility]  4. HLD needs repeat lipids on lipitor per primary   Signed: Jenkins Rouge 11/03/2018, 1:13 PM

## 2018-11-03 ENCOUNTER — Other Ambulatory Visit: Payer: Self-pay

## 2018-11-03 ENCOUNTER — Ambulatory Visit (INDEPENDENT_AMBULATORY_CARE_PROVIDER_SITE_OTHER): Payer: Self-pay | Admitting: Cardiovascular Disease

## 2018-11-03 ENCOUNTER — Encounter: Payer: Self-pay | Admitting: Cardiovascular Disease

## 2018-11-03 VITALS — BP 199/82 | HR 80 | Temp 97.3°F | Ht 59.0 in | Wt 182.0 lb

## 2018-11-03 DIAGNOSIS — R079 Chest pain, unspecified: Secondary | ICD-10-CM

## 2018-11-03 MED ORDER — LISINOPRIL 20 MG PO TABS: 20.0000 mg | ORAL_TABLET | Freq: Every day | ORAL | 3 refills | Status: DC

## 2018-11-03 NOTE — Patient Instructions (Signed)
Medication Instructions:  Take lisinopril 20 mg daily   Labwork: none  Testing/Procedures: Your physician has requested that you have a lexiscan myoview. For further information please visit [email or web address]. Please follow instruction sheet, as given.    Follow-Up: Your physician recommends that you schedule a follow-up appointment in: as needed   Any Other Special Instructions Will Be Listed Below (If Applicable).     If you need a refill on your cardiac medications before your next appointment, please call your pharmacy.

## 2018-11-03 NOTE — Addendum Note (Signed)
Addended by: Debbora Lacrosse R on: 11/03/2018 01:19 PM   Modules accepted: Orders

## 2018-11-11 ENCOUNTER — Other Ambulatory Visit (HOSPITAL_COMMUNITY)
Admission: RE | Admit: 2018-11-11 | Discharge: 2018-11-11 | Disposition: A | Discharge status: Home / Self Care | Payer: Self-pay | Source: Ambulatory Visit | Attending: Physician Assistant | Admitting: Physician Assistant

## 2018-11-11 ENCOUNTER — Encounter (HOSPITAL_BASED_OUTPATIENT_CLINIC_OR_DEPARTMENT_OTHER)
Admission: RE | Admit: 2018-11-11 | Discharge: 2018-11-11 | Disposition: A | Discharge status: Home / Self Care | Payer: Self-pay | Source: Ambulatory Visit | Attending: Cardiovascular Disease | Admitting: Cardiovascular Disease

## 2018-11-11 ENCOUNTER — Other Ambulatory Visit: Payer: Self-pay

## 2018-11-11 ENCOUNTER — Encounter (HOSPITAL_COMMUNITY)
Admission: RE | Admit: 2018-11-11 | Discharge: 2018-11-11 | Disposition: A | Discharge status: Home / Self Care | Payer: Self-pay | Source: Ambulatory Visit | Attending: Cardiovascular Disease | Admitting: Cardiovascular Disease

## 2018-11-11 DIAGNOSIS — R69 Illness, unspecified: Secondary | ICD-10-CM | POA: Insufficient documentation

## 2018-11-11 DIAGNOSIS — R079 Chest pain, unspecified: Secondary | ICD-10-CM | POA: Insufficient documentation

## 2018-11-11 LAB — NM MYOCAR MULTI W/SPECT W/WALL MOTION / EF
LV dias vol: 40 mL (ref 46–106)
LV sys vol: 13 mL
Peak HR: 114 {beats}/min
RATE: 0.33
Rest HR: 70 {beats}/min
SDS: 4
SRS: 4
SSS: 8
TID: 1.11

## 2018-11-11 MED ORDER — TECHNETIUM TC 99M TETROFOSMIN IV KIT
10.0000 | PACK | Freq: Once | INTRAVENOUS | Status: AC | PRN
  Administered 2018-11-11: 11 via INTRAVENOUS

## 2018-11-11 MED ORDER — SODIUM CHLORIDE 0.9% FLUSH
INTRAVENOUS | Status: AC
  Administered 2018-11-11: 10 mL via INTRAVENOUS
  Filled 2018-11-11: qty 10

## 2018-11-11 MED ORDER — TECHNETIUM TC 99M TETROFOSMIN IV KIT
30.0000 | PACK | Freq: Once | INTRAVENOUS | Status: AC | PRN
  Administered 2018-11-11: 33 via INTRAVENOUS

## 2018-11-11 MED ORDER — REGADENOSON 0.4 MG/5ML IV SOLN
INTRAVENOUS | Status: AC
  Administered 2018-11-11: 0.4 mg via INTRAVENOUS
  Filled 2018-11-11: qty 5

## 2018-11-17 ENCOUNTER — Ambulatory Visit: Payer: Self-pay | Admitting: Physician Assistant

## 2018-11-18 ENCOUNTER — Encounter: Payer: Self-pay | Admitting: Physician Assistant

## 2018-11-18 ENCOUNTER — Ambulatory Visit: Payer: Self-pay | Admitting: Physician Assistant

## 2018-11-18 ENCOUNTER — Other Ambulatory Visit: Payer: Self-pay

## 2018-11-18 VITALS — BP 104/74 | HR 78 | Temp 98.1°F

## 2018-11-18 DIAGNOSIS — Z1211 Encounter for screening for malignant neoplasm of colon: Secondary | ICD-10-CM

## 2018-11-18 DIAGNOSIS — J449 Chronic obstructive pulmonary disease, unspecified: Secondary | ICD-10-CM

## 2018-11-18 DIAGNOSIS — E785 Hyperlipidemia, unspecified: Secondary | ICD-10-CM

## 2018-11-18 DIAGNOSIS — F17200 Nicotine dependence, unspecified, uncomplicated: Secondary | ICD-10-CM

## 2018-11-18 DIAGNOSIS — I10 Essential (primary) hypertension: Secondary | ICD-10-CM

## 2018-11-18 NOTE — Progress Notes (Signed)
BP 104/74   Pulse 78   Temp 98.1 F (36.7 C)   SpO2 97%    Subjective:    Patient ID: Pamela Livingston, female    DOB: 1965/02/01, 53 y.o.   MRN: [id number]  HPI: [name] is a 53 y.o. female presenting on 11/18/2018 for COPD, Hyperlipidemia, and Gastroesophageal Reflux   HPI  Patient had a negative COVID-19 screening questionnaire  Patient is a 53 year old female with history of hypertension and dyslipidemia and COPD who continues to smoke.  Patient did not get her labs drawn because when she went to the lab told her that orders needed to be reentered.  This is questionable as labs were just entered in July with expected to be drawn 11/12/2018.  However end result is patient does not have her labs to discuss today.  Pt did not get covid test in July as ordered.  Patient says she just did not want to be tested.  Discussed with patient that this was not a very good choice as she could potentially put other people at risk if she were to have been positive.   Patient says she is doing fine and she is avoiding crowded places to reduce risk of getting sick.  She says she is wearing a mask when she goes out to the grocery.   Relevant past medical, surgical, family and social history reviewed and updated as indicated. Interim medical history since our last visit reviewed. Allergies and medications reviewed and updated.    Current Outpatient Medications:  .  acetaminophen (TYLENOL) 500 MG tablet, Take 500 mg by mouth every 6 (six) hours as needed., Disp: , Rfl:  .  albuterol (PROVENTIL) (2.5 MG/3ML) 0.083% nebulizer solution, INHALE 1 VIAL VIA NEBULIZER EVERY 6 HOURS AS NEEDED FOR WHEEZING OR SHORTNESS OF BREATH, Disp: 225 mL, Rfl: 0 .  atorvastatin (LIPITOR) 20 MG tablet, TAKE 1 Tablet BY MOUTH EVERY DAY, Disp: 90 tablet, Rfl: 1 .  diphenhydrAMINE (BENADRYL) 25 MG tablet, Take 50 mg by mouth at bedtime. , Disp: , Rfl:  .  DULERA 200-5 MCG/ACT AERO, INHALE 2 PUFFS BY MOUTH TWICE DAILY.  RINSE MOUTH AFTER EACH USE, Disp: 39 g, Rfl: 1 .  lisinopril (ZESTRIL) 20 MG tablet, Take 1 tablet (20 mg total) by mouth daily., Disp: 90 tablet, Rfl: 3 .  loratadine-pseudoephedrine (CLARITIN-D 24-HOUR) 10-240 MG 24 hr tablet, Take 1 tablet by mouth as needed. , Disp: , Rfl:  .  montelukast (SINGULAIR) 10 MG tablet, TAKE 1 Tablet BY MOUTH EVERY DAY AS NEEDED (Patient taking differently: Take 10 mg by mouth daily. ), Disp: 90 tablet, Rfl: 0 .  nitroGLYCERIN (NITROSTAT) 0.4 MG SL tablet, Place 1 tablet (0.4 mg total) under the tongue every 5 (five) minutes as needed for chest pain., Disp: 25 tablet, Rfl: prn .  omeprazole (PRILOSEC) 40 MG capsule, TAKE 1 Capsule BY MOUTH EVERY DAY, Disp: 90 capsule, Rfl: 1 .  PROVENTIL HFA 108 (90 Base) MCG/ACT inhaler, INHALE 2 PUFFS BY MOUTH EVERY 6 HOURS AS NEEDED FOR COUGHING, WHEEZING, OR SHORTNESS OF BREATH, Disp: 20.1 g, Rfl: 1   Review of Systems  Per HPI unless specifically indicated above     Objective:    BP 104/74   Pulse 78   Temp 98.1 F (36.7 C)   SpO2 97%   Wt Readings from Last 3 Encounters:  11/03/18 182 lb (82.6 kg)  10/01/18 179 lb 12.8 oz (81.6 kg)  10/03/17 179 lb 8 oz (81.4  kg)    Physical Exam Vitals signs reviewed.  Constitutional:      General: She is not in acute distress.    Appearance: She is well-developed. She is obese. She is not ill-appearing.  HENT:     Head: Normocephalic and atraumatic.  Neck:     Musculoskeletal: Neck supple.  Cardiovascular:     Rate and Rhythm: Normal rate and regular rhythm.  Pulmonary:     Effort: Pulmonary effort is normal. No respiratory distress.     Breath sounds: Wheezing present.  Abdominal:     General: Bowel sounds are normal.     Palpations: Abdomen is soft. There is no mass.     Tenderness: There is no abdominal tenderness.  Musculoskeletal:     Right lower leg: No edema.     Left lower leg: No edema.  Lymphadenopathy:     Cervical: No cervical adenopathy.  Skin:     General: Skin is warm and dry.  Neurological:     Mental Status: She is alert and oriented to person, place, and time.  Psychiatric:        Attention and Perception: Attention normal.        Speech: Speech normal.        Behavior: Behavior is cooperative.         Assessment & Plan:    1.  Hypertension  Patient is to get fasting labs drawn.  She will be called with results.  Patient is to continue current lisinopril.  2.  Dyslipidemia  Patient is to continue atorvastatin.  Patient is reminded to continue low-fat diet.   3.  COPD/tobacco use disorder  Patient is encouraged to stop smoking.  Discussed with patient that her lungs are not clear and her breathing would improve if she were to quit smoking.  Patient is currently using Dulera 200 in addition to albuterol inhaler and nebulizer treatments.  Discussed with patient that we will be glad to continue to treat her for this condition but her condition will continue to worsen if she continues to smoke.  Patient states understanding  4.  Healthcare maintenance  Patient is given iFOBT for colon cancer screening.  Patient mammogram is up-to-date.  Patient is status post hysterectomy and does not need continuing Pap smears.  Patient has already received influenza immunization for this season.  Patient is encouraged to continue to wear a mask when she goes out in public like to the grocery store.  She is encouraged to avoid large crowds.    Patient will follow up in 4 months with a telemedicine appointment.  Patient is to contact the office sooner as needed for any problems.

## 2018-11-19 ENCOUNTER — Other Ambulatory Visit: Payer: Self-pay | Admitting: Physician Assistant

## 2018-11-19 DIAGNOSIS — Z1211 Encounter for screening for malignant neoplasm of colon: Secondary | ICD-10-CM

## 2018-12-01 ENCOUNTER — Encounter: Payer: Self-pay | Admitting: Physician Assistant

## 2019-01-19 ENCOUNTER — Other Ambulatory Visit: Payer: Self-pay | Admitting: Physician Assistant

## 2019-01-20 ENCOUNTER — Other Ambulatory Visit: Payer: Self-pay | Admitting: Physician Assistant

## 2019-03-09 ENCOUNTER — Other Ambulatory Visit: Payer: Self-pay | Admitting: Physician Assistant

## 2019-03-09 DIAGNOSIS — I10 Essential (primary) hypertension: Secondary | ICD-10-CM

## 2019-03-09 DIAGNOSIS — E785 Hyperlipidemia, unspecified: Secondary | ICD-10-CM

## 2019-03-17 ENCOUNTER — Other Ambulatory Visit (HOSPITAL_COMMUNITY)
Admission: RE | Admit: 2019-03-17 | Discharge: 2019-03-17 | Disposition: A | Discharge status: Home / Self Care | Payer: Self-pay | Source: Ambulatory Visit | Attending: Physician Assistant | Admitting: Physician Assistant

## 2019-03-17 ENCOUNTER — Other Ambulatory Visit: Payer: Self-pay

## 2019-03-17 DIAGNOSIS — E785 Hyperlipidemia, unspecified: Secondary | ICD-10-CM | POA: Insufficient documentation

## 2019-03-17 DIAGNOSIS — I10 Essential (primary) hypertension: Secondary | ICD-10-CM | POA: Insufficient documentation

## 2019-03-17 LAB — COMPREHENSIVE METABOLIC PANEL
ALT: 15 U/L (ref 0–44)
AST: 15 U/L (ref 15–41)
Albumin: 4.1 g/dL (ref 3.5–5.0)
Alkaline Phosphatase: 93 U/L (ref 38–126)
Anion gap: 9 (ref 5–15)
BUN: 17 mg/dL (ref 6–20)
CO2: 24 mmol/L (ref 22–32)
Calcium: 9.3 mg/dL (ref 8.9–10.3)
Chloride: 107 mmol/L (ref 98–111)
Creatinine, Ser: 0.73 mg/dL (ref 0.44–1.00)
GFR calc Af Amer: >60 mL/min (ref >60)
GFR calc non Af Amer: >60 mL/min (ref >60)
Glucose, Bld: 103 mg/dL — ABNORMAL HIGH (ref 70–99)
Potassium: 4.2 mmol/L (ref 3.5–5.1)
Sodium: 140 mmol/L (ref 135–145)
Total Bilirubin: 0.4 mg/dL (ref 0.3–1.2)
Total Protein: 7.2 g/dL (ref 6.5–8.1)

## 2019-03-17 LAB — LIPID PANEL
Cholesterol: 147 mg/dL (ref 0–200)
HDL: 43 mg/dL (ref >40)
LDL Cholesterol: 87 mg/dL (ref 0–99)
Total CHOL/HDL Ratio: 3.4 RATIO
Triglycerides: 87 mg/dL (ref <150)
VLDL: 17 mg/dL (ref 0–40)

## 2019-03-18 ENCOUNTER — Ambulatory Visit: Payer: Self-pay | Admitting: Physician Assistant

## 2019-03-18 ENCOUNTER — Encounter: Payer: Self-pay | Admitting: Physician Assistant

## 2019-03-18 VITALS — BP 115/80 | HR 96

## 2019-03-18 DIAGNOSIS — I10 Essential (primary) hypertension: Secondary | ICD-10-CM

## 2019-03-18 DIAGNOSIS — E785 Hyperlipidemia, unspecified: Secondary | ICD-10-CM

## 2019-03-18 DIAGNOSIS — J449 Chronic obstructive pulmonary disease, unspecified: Secondary | ICD-10-CM

## 2019-03-18 DIAGNOSIS — F17200 Nicotine dependence, unspecified, uncomplicated: Secondary | ICD-10-CM

## 2019-03-18 DIAGNOSIS — K219 Gastro-esophageal reflux disease without esophagitis: Secondary | ICD-10-CM

## 2019-03-18 MED ORDER — OMEPRAZOLE 40 MG PO CPDR: 40.0000 mg | DELAYED_RELEASE_CAPSULE | Freq: Two times a day (BID) | ORAL | 1 refills | Status: DC

## 2019-03-18 NOTE — Progress Notes (Signed)
BP 115/80   Pulse 96    Subjective:    Patient ID: Pamela Livingston, female    DOB: 1965/10/30, 54 y.o.   MRN: [id number]  HPI: Pamela Livingston is a 54 y.o. female presenting on 03/18/2019 for No chief complaint on file.   HPI  This is a telemedicine appointment due to coronavirus pandemic.  It started with connection through [employer] but had to be changed to Telephone due to  Bad signal.  I connected with  Pamela Livingston on 03/21/19 by a video enabled telemedicine application and verified that I am speaking with the correct person using two identifiers.   I discussed the limitations of evaluation and management by telemedicine. The patient expressed understanding and agreed to proceed.  Pt is at home.  Provider is working from home office.     Pt is 5yoF with htn, copd, dyslipidemia.  She continues to smoke.  Pt says her Breathing is okay.  She does report some congestion at times.  She says her gerd is better.   But she does still have Some epigastric pain  Pt says her dog ate her ifobt given in October.        Relevant past medical, surgical, family and social history reviewed and updated as indicated. Interim medical history since our last visit reviewed. Allergies and medications reviewed and updated.   Current Outpatient Medications:  .  acetaminophen (TYLENOL) 500 MG tablet, Take 500 mg by mouth every 6 (six) hours as needed., Disp: , Rfl:  .  albuterol (PROVENTIL) (2.5 MG/3ML) 0.083% nebulizer solution, INHALE 1 VIAL VIA NEBULIZER EVERY 6 HOURS AS NEEDED FOR WHEEZING OR SHORTNESS OF BREATH, Disp: 225 mL, Rfl: 0 .  atorvastatin (LIPITOR) 20 MG tablet, TAKE 1 Tablet BY MOUTH EVERY DAY, Disp: 90 tablet, Rfl: 1 .  diphenhydrAMINE (BENADRYL) 25 MG tablet, Take 50 mg by mouth at bedtime. , Disp: , Rfl:  .  DULERA 200-5 MCG/ACT AERO, INHALE 2 PUFFS BY MOUTH TWICE DAILY. RINSE MOUTH AFTER EACH USE, Disp: 39 g, Rfl: 1 .  lisinopril (ZESTRIL) 20 MG tablet, Take 1 tablet (20 mg total) by  mouth daily., Disp: 90 tablet, Rfl: 3 .  loratadine-pseudoephedrine (CLARITIN-D 24-HOUR) 10-240 MG 24 hr tablet, Take 1 tablet by mouth as needed. , Disp: , Rfl:  .  montelukast (SINGULAIR) 10 MG tablet, Take 1 tablet (10 mg total) by mouth daily as needed., Disp: 90 tablet, Rfl: 1 .  nitroGLYCERIN (NITROSTAT) 0.4 MG SL tablet, Place 1 tablet (0.4 mg total) under the tongue every 5 (five) minutes as needed for chest pain., Disp: 25 tablet, Rfl: prn .  omeprazole (PRILOSEC) 40 MG capsule, TAKE 1 Capsule BY MOUTH EVERY DAY, Disp: 90 capsule, Rfl: 1 .  PROVENTIL HFA 108 (90 Base) MCG/ACT inhaler, INHALE 2 PUFFS BY MOUTH EVERY 6 HOURS AS NEEDED FOR COUGHING, WHEEZING, OR SHORTNESS OF BREATH, Disp: 20.1 g, Rfl: 1   Review of Systems  Per HPI unless specifically indicated above     Objective:    BP 115/80   Pulse 96   Wt Readings from Last 3 Encounters:  11/03/18 182 lb (82.6 kg)  10/01/18 179 lb 12.8 oz (81.6 kg)  10/03/17 179 lb 8 oz (81.4 kg)    Physical Exam Constitutional:      General: She is not in acute distress.    Appearance: Normal appearance. She is not ill-appearing.  HENT:     Head: Normocephalic and atraumatic.  Pulmonary:  Effort: No respiratory distress.  Neurological:     Mental Status: She is alert and oriented to person, place, and time.  Psychiatric:        Attention and Perception: Attention normal.        Mood and Affect: Mood normal.        Speech: Speech normal.        Behavior: Behavior normal. Behavior is cooperative.     Results for orders placed or performed during the hospital encounter of 03/17/19  Lipid panel  Result Value Ref Range   Cholesterol 147 0 - 200 mg/dL   Triglycerides 87 <150 mg/dL   HDL 43 >40 mg/dL   Total CHOL/HDL Ratio 3.4 RATIO   VLDL 17 0 - 40 mg/dL   LDL Cholesterol 87 0 - 99 mg/dL  Comprehensive metabolic panel  Result Value Ref Range   Sodium 140 135 - 145 mmol/L   Potassium 4.2 3.5 - 5.1 mmol/L   Chloride 107 98 -  111 mmol/L   CO2 24 22 - 32 mmol/L   Glucose, Bld 103 (H) 70 - 99 mg/dL   BUN 17 6 - 20 mg/dL   Creatinine, Ser 0.73 0.44 - 1.00 mg/dL   Calcium 9.3 8.9 - 10.3 mg/dL   Total Protein 7.2 6.5 - 8.1 g/dL   Albumin 4.1 3.5 - 5.0 g/dL   AST 15 15 - 41 U/L   ALT 15 0 - 44 U/L   Alkaline Phosphatase 93 38 - 126 U/L   Total Bilirubin 0.4 0.3 - 1.2 mg/dL   GFR calc non Af Amer >60 >60 mL/min   GFR calc Af Amer >60 >60 mL/min   Anion gap 9 5 - 15      Assessment & Plan:   Encounter Diagnoses  Name Primary?  . Essential hypertension Yes  . Gastroesophageal reflux disease, unspecified whether esophagitis present   . Hyperlipidemia, unspecified hyperlipidemia type   . Tobacco use disorder   . Chronic obstructive pulmonary disease, unspecified COPD type ([place])      -reviewed labs with pt -Increase omeprazole to bid -continue other meds -encouraged smoking cessation -pt to follow up 6 week to reckeck epigastic pain.  She is to contact office sooner for any worsening or new symptoms

## 2019-04-28 ENCOUNTER — Other Ambulatory Visit: Payer: Self-pay | Admitting: Physician Assistant

## 2019-04-29 ENCOUNTER — Other Ambulatory Visit: Payer: Self-pay | Admitting: Physician Assistant

## 2019-04-30 ENCOUNTER — Telehealth: Payer: Self-pay | Admitting: Physician Assistant

## 2019-04-30 NOTE — Telephone Encounter (Signed)
Pt called requesting antibiotics and steroids for her allergies.  Discussed with pt that this not appropriate.  She was scheduled for covid test tomorrow.  Recommended OTC antihistamines, saline nasal spray and smoking avoidance to help symptoms.  Counseled pt on self-isolation until test results available.  Pt states understanding

## 2019-05-01 ENCOUNTER — Ambulatory Visit: Payer: HRSA Program | Attending: Internal Medicine

## 2019-05-01 ENCOUNTER — Other Ambulatory Visit: Payer: Self-pay

## 2019-05-01 DIAGNOSIS — Z20822 Contact with and (suspected) exposure to covid-19: Secondary | ICD-10-CM | POA: Diagnosis present

## 2019-05-02 LAB — NOVEL CORONAVIRUS, NAA: SARS-CoV-2, NAA: NOT DETECTED

## 2019-05-02 LAB — SARS-COV-2, NAA 2 DAY TAT

## 2019-05-04 ENCOUNTER — Telehealth: Payer: Self-pay | Admitting: Physician Assistant

## 2019-05-04 NOTE — Telephone Encounter (Signed)
Pt was called and discussed negative covid 19 test results.  She has appointment to be seen in office 05/06/19

## 2019-05-06 ENCOUNTER — Encounter: Payer: Self-pay | Admitting: Physician Assistant

## 2019-05-06 ENCOUNTER — Other Ambulatory Visit: Payer: Self-pay

## 2019-05-06 ENCOUNTER — Ambulatory Visit: Payer: Self-pay | Admitting: Physician Assistant

## 2019-05-06 VITALS — BP 112/81 | HR 64 | Temp 98.1°F | Ht 59.0 in | Wt 184.0 lb

## 2019-05-06 DIAGNOSIS — R1084 Generalized abdominal pain: Secondary | ICD-10-CM

## 2019-05-06 DIAGNOSIS — J449 Chronic obstructive pulmonary disease, unspecified: Secondary | ICD-10-CM

## 2019-05-06 DIAGNOSIS — I10 Essential (primary) hypertension: Secondary | ICD-10-CM

## 2019-05-06 DIAGNOSIS — F17200 Nicotine dependence, unspecified, uncomplicated: Secondary | ICD-10-CM

## 2019-05-06 DIAGNOSIS — E785 Hyperlipidemia, unspecified: Secondary | ICD-10-CM

## 2019-05-06 DIAGNOSIS — K219 Gastro-esophageal reflux disease without esophagitis: Secondary | ICD-10-CM

## 2019-05-06 NOTE — Progress Notes (Signed)
BP 112/81   Pulse 64   Temp 98.1 F (36.7 C)   Ht 4\' 11"  (1.499 m)   Wt 184 lb (83.5 kg)   SpO2 100%   BMI 37.16 kg/m    Subjective:    Patient ID: Pamela Livingston, female    DOB: 1965-04-26, 54 y.o.   MRN: [id number]  HPI: Pamela Livingston is a 54 y.o. female presenting on 05/06/2019 for No chief complaint on file.   HPI  Pt had a negative covid 19 screening questionnaire.   Pt is 59yoF with copd and tobacco use disorder.  She says that [facility] is no longer carrying dulera  She says her Congestion is much better than last week.  she Had negative covid test last week.  Pt with continuing abdominal pain and she thinks it is still bothersome.    Relevant past medical, surgical, family and social history reviewed and updated as indicated. Interim medical history since our last visit reviewed. Allergies and medications reviewed and updated.    Current Outpatient Medications:  .  acetaminophen (TYLENOL) 500 MG tablet, Take 500 mg by mouth every 6 (six) hours as needed., Disp: , Rfl:  .  albuterol (PROVENTIL) (2.5 MG/3ML) 0.083% nebulizer solution, INHALE 1 VIAL VIA NEBULIZER EVERY 6 HOURS AS NEEDED FOR WHEEZING OR SHORTNESS OF BREATH, Disp: 225 mL, Rfl: 0 .  atorvastatin (LIPITOR) 20 MG tablet, TAKE 1 Tablet BY MOUTH ONCE EVERY DAY, Disp: 90 tablet, Rfl: 1 .  diphenhydrAMINE (BENADRYL) 25 MG tablet, Take 50 mg by mouth at bedtime. , Disp: , Rfl:  .  DULERA 200-5 MCG/ACT AERO, INHALE 2 PUFFS BY MOUTH TWICE DAILY. RINSE MOUTH AFTER EACH USE, Disp: 39 g, Rfl: 1 .  lisinopril (ZESTRIL) 20 MG tablet, Take 1 tablet (20 mg total) by mouth daily., Disp: 90 tablet, Rfl: 3 .  loratadine-pseudoephedrine (CLARITIN-D 24-HOUR) 10-240 MG 24 hr tablet, Take 1 tablet by mouth as needed. , Disp: , Rfl:  .  montelukast (SINGULAIR) 10 MG tablet, Take 1 tablet (10 mg total) by mouth daily as needed., Disp: 90 tablet, Rfl: 1 .  omeprazole (PRILOSEC) 40 MG capsule, Take 1 capsule (40 mg total) by mouth 2  (two) times daily., Disp: 180 capsule, Rfl: 1 .  PROVENTIL HFA 108 (90 Base) MCG/ACT inhaler, INHALE 2 PUFFS BY MOUTH EVERY 6 HOURS AS NEEDED FOR COUGHING, WHEEZING, OR SHORTNESS OF BREATH, Disp: 20.1 g, Rfl: 1 .  nitroGLYCERIN (NITROSTAT) 0.4 MG SL tablet, Place 1 tablet (0.4 mg total) under the tongue every 5 (five) minutes as needed for chest pain. (Patient not taking: Reported on 05/06/2019), Disp: 25 tablet, Rfl: prn   Review of Systems  Per HPI unless specifically indicated above     Objective:    BP 112/81   Pulse 64   Temp 98.1 F (36.7 C)   Ht 4\' 11"  (1.499 m)   Wt 184 lb (83.5 kg)   SpO2 100%   BMI 37.16 kg/m   Wt Readings from Last 3 Encounters:  05/06/19 184 lb (83.5 kg)  11/03/18 182 lb (82.6 kg)  10/01/18 179 lb 12.8 oz (81.6 kg)    Physical Exam Vitals reviewed.  Constitutional:      General: She is not in acute distress.    Appearance: Normal appearance. She is well-developed. She is obese. She is not ill-appearing.  HENT:     Head: Normocephalic and atraumatic.  Cardiovascular:     Rate and Rhythm: Normal rate and regular rhythm.  Pulmonary:     Effort: Pulmonary effort is normal.     Breath sounds: Normal breath sounds.  Abdominal:     General: Bowel sounds are normal.     Palpations: Abdomen is soft. There is no mass.     Tenderness: There is no abdominal tenderness.  Musculoskeletal:     Cervical back: Neck supple.     Right lower leg: No edema.     Left lower leg: No edema.  Lymphadenopathy:     Cervical: No cervical adenopathy.  Skin:    General: Skin is warm and dry.  Neurological:     Mental Status: She is alert and oriented to person, place, and time.  Psychiatric:        Attention and Perception: Attention normal.        Mood and Affect: Mood normal.        Speech: Speech normal.        Behavior: Behavior normal. Behavior is cooperative.     Results for orders placed or performed in visit on 05/01/19  Novel Coronavirus, NAA  ([facility])   Specimen: Nasopharyngeal(NP) swabs in vial transport medium   NASOPHARYNGE  TESTING  Result Value Ref Range   SARS-CoV-2, NAA Not Detected Not Detected  SARS-COV-2, NAA 2 DAY TAT   NASOPHARYNGE  TESTING  Result Value Ref Range   SARS-CoV-2, NAA 2 DAY TAT Performed       Assessment & Plan:   Encounter Diagnoses  Name Primary?  . Essential hypertension Yes  . Chronic obstructive pulmonary disease, unspecified COPD type ([place])   . Tobacco use disorder   . Generalized abdominal pain   . Gastroesophageal reflux disease, unspecified whether esophagitis present   . Hyperlipidemia, unspecified hyperlipidemia type     -Pt was scheduled for covid vaccination  -She is counseled to Continue masking after she gets her vaccine -encouraged Smoking cessation -Gave sample breztri to use until replacement for dulera is found.  -will Refer to GI as omeprazole is not helping- still with abd pain.   -pt is given application for [facility] financial assistance.   -pt to follow up 3 months.  She is to contact office sooner prn

## 2019-05-08 ENCOUNTER — Encounter: Payer: Self-pay | Admitting: Internal Medicine

## 2019-05-19 ENCOUNTER — Ambulatory Visit: Payer: Self-pay | Admitting: Physician Assistant

## 2019-05-19 ENCOUNTER — Other Ambulatory Visit: Payer: Self-pay

## 2019-05-19 ENCOUNTER — Encounter: Payer: Self-pay | Admitting: Physician Assistant

## 2019-05-19 VITALS — BP 122/80 | HR 90 | Temp 98.1°F | Wt 180.3 lb

## 2019-05-19 DIAGNOSIS — M79604 Pain in right leg: Secondary | ICD-10-CM

## 2019-05-19 MED ORDER — CYCLOBENZAPRINE HCL 10 MG PO TABS: 10.0000 mg | ORAL_TABLET | Freq: Three times a day (TID) | ORAL | 0 refills | Status: DC | PRN

## 2019-05-19 NOTE — Progress Notes (Signed)
   BP 122/80   Pulse 90   Temp 98.1 F (36.7 C)   Wt 180 lb 4.8 oz (81.8 kg)   SpO2 96%   BMI 36.42 kg/m    Subjective:    Patient ID: Pamela Livingston, female    DOB: 10/01/1965, 54 y.o.   MRN: [id number]  HPI: Pamela Livingston is a 54 y.o. female presenting on 05/19/2019 for Leg Problem (on R leg started Thursday on and off. pt states pain is worse when she stands, walks. pt denies any recent injuries. pt rates pain at 10.)   HPI    Pt had a negative covid 19 screening questionnaire.   Pt complains of RLE pain. started Thursday suddenly.  No swelling.  No injury.   Has tried some topicals without much relief.      Relevant past medical, surgical, family and social history reviewed and updated as indicated. Interim medical history since our last visit reviewed. Allergies and medications reviewed and updated.  Review of Systems  Per HPI unless specifically indicated above     Objective:    BP 122/80   Pulse 90   Temp 98.1 F (36.7 C)   Wt 180 lb 4.8 oz (81.8 kg)   SpO2 96%   BMI 36.42 kg/m   Wt Readings from Last 3 Encounters:  05/19/19 180 lb 4.8 oz (81.8 kg)  05/06/19 184 lb (83.5 kg)  11/03/18 182 lb (82.6 kg)    Physical Exam Vitals reviewed.  Constitutional:      General: She is not in acute distress.    Appearance: Normal appearance. She is well-developed. She is not ill-appearing or toxic-appearing.  HENT:     Head: Normocephalic and atraumatic.  Cardiovascular:     Rate and Rhythm: Normal rate and regular rhythm.  Pulmonary:     Effort: Pulmonary effort is normal. No respiratory distress.     Comments: Coarse/wheezey bilaterally Abdominal:     General: Bowel sounds are normal.     Palpations: Abdomen is soft. There is no mass.     Tenderness: There is no abdominal tenderness.  Musculoskeletal:        General: No swelling.     Cervical back: Neck supple.     Right lower leg: Tenderness present. No swelling, deformity, lacerations or bony tenderness. No  edema.     Left lower leg: No swelling. No edema.       Legs:     Comments: Both calves 38.5 cm diameter.  No redness.  Negative homan's.  There is soft tissue tenderness anteriorly.  No masses.  No rashes. No bony tenderness.   No calf tenderness or swelling.  Lymphadenopathy:     Cervical: No cervical adenopathy.  Skin:    General: Skin is warm and dry.  Neurological:     Mental Status: She is alert and oriented to person, place, and time.  Psychiatric:        Behavior: Behavior normal.           Assessment & Plan:    Encounter Diagnosis  Name Primary?  . Pain of right lower extremity Yes      -Heat and muslce relaxers.  Counseled to avoid driving on rx due to sedation.  Can take APAP prn pain -to ER if SOB -follow up as scheduled.  Contact office sooner prn

## 2019-05-26 ENCOUNTER — Other Ambulatory Visit: Payer: Self-pay | Admitting: Physician Assistant

## 2019-05-26 MED ORDER — NYSTATIN 100000 UNIT/ML MT SUSP: 5.0000 mL | Freq: Four times a day (QID) | OROMUCOSAL | 0 refills | Status: DC

## 2019-05-27 ENCOUNTER — Encounter: Payer: Self-pay | Admitting: Gastroenterology

## 2019-05-27 ENCOUNTER — Ambulatory Visit (INDEPENDENT_AMBULATORY_CARE_PROVIDER_SITE_OTHER): Payer: Self-pay | Admitting: Gastroenterology

## 2019-05-27 ENCOUNTER — Other Ambulatory Visit: Payer: Self-pay

## 2019-05-27 ENCOUNTER — Telehealth: Payer: Self-pay | Admitting: Student

## 2019-05-27 ENCOUNTER — Encounter: Payer: Self-pay | Admitting: *Deleted

## 2019-05-27 VITALS — BP 107/78 | HR 84 | Temp 97.5°F | Ht 59.0 in | Wt 182.0 lb

## 2019-05-27 DIAGNOSIS — K219 Gastro-esophageal reflux disease without esophagitis: Secondary | ICD-10-CM

## 2019-05-27 DIAGNOSIS — R1013 Epigastric pain: Secondary | ICD-10-CM

## 2019-05-27 DIAGNOSIS — R1011 Right upper quadrant pain: Secondary | ICD-10-CM

## 2019-05-27 DIAGNOSIS — K5900 Constipation, unspecified: Secondary | ICD-10-CM

## 2019-05-27 DIAGNOSIS — K625 Hemorrhage of anus and rectum: Secondary | ICD-10-CM

## 2019-05-27 MED ORDER — LINACLOTIDE 290 MCG PO CAPS: 290.0000 ug | ORAL_CAPSULE | Freq: Every day | ORAL | 0 refills | Status: DC

## 2019-05-27 NOTE — Progress Notes (Signed)
Primary Care Physician:  Soyla Dryer, PA-C  Primary Gastroenterologist:  Garfield Cornea, MD   Chief Complaint  Patient presents with  . Abdominal Pain    ruq and right side  . Gastroesophageal Reflux  . Constipation    HPI:  Pamela Livingston is a 54 y.o. female here at the request of Soyla Dryer, PA-C for further evaluation of GERD and abdominal pain.  She reports several months of right sided abdominal pain. She gives history of remote PUD (treated empirically). Has been on increased omeprazole to 40mg  BID for about a month. Has not helped. Right upper quadrant pain radiates to right flank and back, intermittent. Not necessarily related food. Wakes up nauseated. Goes away after couple of hours. No vomiting. No significant weight loss. Some heartburn even on omeprazole BID. Some solid food dysphagia. BM infrequent, [place] 1. May have [place] 1 for few weeks and then may have a good BM. No melena. Some blood in stool for years. No ASA or NSAIDs.   Current Outpatient Medications  Medication Sig Dispense Refill  . acetaminophen (TYLENOL) 500 MG tablet Take 500 mg by mouth every 6 (six) hours as needed.    Marland Kitchen albuterol (PROVENTIL) (2.5 MG/3ML) 0.083% nebulizer solution INHALE 1 VIAL VIA NEBULIZER EVERY 6 HOURS AS NEEDED FOR WHEEZING OR SHORTNESS OF BREATH 225 mL 0  . atorvastatin (LIPITOR) 20 MG tablet TAKE 1 Tablet BY MOUTH ONCE EVERY DAY 90 tablet 1  . cyclobenzaprine (FLEXERIL) 10 MG tablet Take 1 tablet (10 mg total) by mouth 3 (three) times daily as needed for muscle spasms. 30 tablet 0  . diphenhydrAMINE (BENADRYL) 25 MG tablet Take 50 mg by mouth at bedtime.     . DULERA 200-5 MCG/ACT AERO INHALE 2 PUFFS BY MOUTH TWICE DAILY. RINSE MOUTH AFTER EACH USE 39 g 1  . lisinopril (ZESTRIL) 20 MG tablet Take 1 tablet (20 mg total) by mouth daily. 90 tablet 3  . loratadine-pseudoephedrine (CLARITIN-D 24-HOUR) 10-240 MG 24 hr tablet Take 1 tablet by mouth as needed.     . montelukast  (SINGULAIR) 10 MG tablet Take 1 tablet (10 mg total) by mouth daily as needed. 90 tablet 1  . nitroGLYCERIN (NITROSTAT) 0.4 MG SL tablet Place 1 tablet (0.4 mg total) under the tongue every 5 (five) minutes as needed for chest pain. 25 tablet prn  . nystatin (MYCOSTATIN) 100000 UNIT/ML suspension Take 5 mLs (500,000 Units total) by mouth 4 (four) times daily. - swish & swallow 60 mL 0  . omeprazole (PRILOSEC) 40 MG capsule Take 1 capsule (40 mg total) by mouth 2 (two) times daily. 180 capsule 1  . PROVENTIL HFA 108 (90 Base) MCG/ACT inhaler INHALE 2 PUFFS BY MOUTH EVERY 6 HOURS AS NEEDED FOR COUGHING, WHEEZING, OR SHORTNESS OF BREATH 20.1 g 1   No current facility-administered medications for this visit.    Allergies as of 05/27/2019 - Review Complete 05/27/2019  Allergen Reaction Noted  . Asa [aspirin] Anaphylaxis, Hives, and Swelling 08/16/2011    Past Medical History:  Diagnosis Date  . Asthma   . Chronic pelvic pain in female   . COPD (chronic obstructive pulmonary disease) ([place])   . Endometriosis   . Polysubstance abuse ([facility])    marijuana, cocaine, benzos  . Stomach ulcer   . Suicide attempt ([place]) 2008   by phenergan overdose  . Syncope     Past Surgical History:  Procedure Laterality Date  . ABDOMINAL HYSTERECTOMY    . CERVICAL CONE BIOPSY  cervical cancer  . CESAREAN SECTION  1993  . CHOLECYSTECTOMY    . RADICAL HYSTERECTOMY WITH TRANSPOSITION OF OVARIES    . TUBAL LIGATION  1993    Family History  Problem Relation Age of Onset  . Heart disease Mother   . Hypertension Mother   . Diabetes Mother   . Hypertension Father   . Heart attack Maternal Uncle   . COPD Paternal Grandfather   . Heart attack Maternal Uncle   . Colon cancer Neg Hx     Social History   Socioeconomic History  . Marital status: Legally Separated    Spouse name: Not on file  . Number of children: Not on file  . Years of education: Not on file  . Highest education level: Not on file   Occupational History  . Not on file  Tobacco Use  . Smoking status: Current Every Day Smoker    Packs/day: 0.25    Years: 27.00    Pack years: 6.75    Types: Cigarettes  . Smokeless tobacco: Never Used  Substance and Sexual Activity  . Alcohol use: Not Currently    Comment:  never  . Drug use: No    Types: Marijuana, Cocaine, "Crack" cocaine    Comment: past use QUIT DATE 04-2008 (UDS positive 02/2015)  . Sexual activity: Yes    Birth control/protection: Surgical  Other Topics Concern  . Not on file  Social History Narrative  . Not on file   Social Determinants of Health   Financial Resource Strain:   . Difficulty of Paying Living Expenses:   Food Insecurity:   . Worried About Charity fundraiser in the Last Year:   . Arboriculturist in the Last Year:   Transportation Needs:   . Film/video editor (Medical):   Marland Kitchen Lack of Transportation (Non-Medical):   Physical Activity:   . Days of Exercise per Week:   . Minutes of Exercise per Session:   Stress:   . Feeling of Stress :   Social Connections:   . Frequency of Communication with Friends and Family:   . Frequency of Social Gatherings with Friends and Family:   . Attends Religious Services:   . Active Member of Clubs or Organizations:   . Attends Archivist Meetings:   Marland Kitchen Marital Status:   Intimate Partner Violence:   . Fear of Current or Ex-Partner:   . Emotionally Abused:   Marland Kitchen Physically Abused:   . Sexually Abused:       ROS:  General: Negative for anorexia, weight loss, fever, chills, fatigue, weakness. Eyes: Negative for vision changes.  ENT: Negative for hoarseness, difficulty swallowing , nasal congestion. CV: Negative for chest pain, angina, palpitations, dyspnea on exertion, peripheral edema.  Respiratory: Negative for dyspnea at rest, dyspnea on exertion, cough, sputum, +wheezing.  GI: See history of present illness. GU:  Negative for dysuria, hematuria, urinary incontinence, urinary  frequency, nocturnal urination.  MS: Negative for joint pain, low back pain.  Derm: Negative for rash or itching.  Neuro: Negative for weakness, abnormal sensation, seizure, frequent headaches, memory loss, confusion.  Psych: Negative for anxiety, depression, suicidal ideation, hallucinations.  Endo: Negative for unusual weight change.  Heme: Negative for bruising or bleeding. Allergy: Negative for rash or hives.    Physical Examination:  BP 107/78   Pulse 84   Temp (!) 97.5 F (36.4 C) (Temporal)   Ht 4\' 11"  (1.499 m)   Wt 182 lb (82.6 kg)  BMI 36.76 kg/m    General: Well-nourished, well-developed in no acute distress.  Head: Normocephalic, atraumatic.   Eyes: Conjunctiva pink, no icterus. Mouth: masked Neck: Supple without thyromegaly, masses, or lymphadenopathy.  Lungs: bilateral wheezing Heart: Regular rate and rhythm, no murmurs rubs or gallops.  Abdomen: Bowel sounds are normal, moderate epigastric tenderness/ruq tenderness, nondistended, no hepatosplenomegaly or masses, no abdominal bruits or hernia , no rebound or guarding.   Rectal: not performed Extremities: No lower extremity edema. No clubbing or deformities.  Neuro: Alert and oriented x 4 , grossly normal neurologically.  Skin: Warm and dry, no rash or jaundice.   Psych: Alert and cooperative, normal mood and affect.  Labs: Lab Results  Component Value Date   CREATININE 0.73 03/17/2019   BUN 17 03/17/2019   NA 140 03/17/2019   K 4.2 03/17/2019   CL 107 03/17/2019   CO2 24 03/17/2019   Lab Results  Component Value Date   ALT 15 03/17/2019   AST 15 03/17/2019   ALKPHOS 93 03/17/2019   BILITOT 0.4 03/17/2019      Imaging Studies: No results found.

## 2019-05-27 NOTE — Telephone Encounter (Signed)
LPN called [phone] c/o thrush after using her inhalers and neb solutions. Pt's pharmacy is [facility].  PA sent rx to pt's pharmacy and recommends for pt to rinse mouth / brush her teeth after each use of her inhaler and nebulizer treatment. LPN notified pt and pt verbalized understanding.

## 2019-05-27 NOTE — Patient Instructions (Addendum)
1. Start Linzess 290 mcg daily before breakfast for constipation. CALL AND LET ME KNOW HOW YOU ARE DOING ON MEDICATION. WE CAN ADJUST DOSE IF NEEDED. ONCE WE FIND A MEDICINE THAT WORKS WE WILL WORK ON GETTING YOU FREE MEDICATION.  2. Continue omeprazole 40mg  twice daily before a meal.  3. Please go to [facility] for labs. Go at least few days before your CT scan. 4. CT scan of your abdomen as scheduled. We will contact you with results as available.  5. Once CT is done, based on findings, we will work on scheduling an upper endoscopy and colonoscopy.  6. Call with any questions or concerns.

## 2019-06-01 NOTE — Assessment & Plan Note (Signed)
55 y/o female with epigastric/ruq pain for several months, no improvement with PPI BID. Denies ASA, NSAIDs. Less likely gastritis or PUD given no improvement on PPI but remains in the differential. Also with obstipation/constipation, rectal bleeding, heartburn, dysphagia. Recommend labs for evaluation. Would advise CT A/P with contrast for evaluation. She has multiple symptoms which may not all be related. But given significant constipation/obstipation with nausea, consider partial bowel obstruction/stricture. Evaluated abdominal pain via CT. Ultimately she will likely need a colonoscopy and upper endoscopy to complete her evaluation.   Will continue omeprazole 40mg  BID. Start Linzess 238mcg daily. Samples provided. Once appropriate dose is determined we can start process for patient assistance.

## 2019-06-08 ENCOUNTER — Other Ambulatory Visit: Payer: Self-pay

## 2019-06-08 ENCOUNTER — Other Ambulatory Visit (HOSPITAL_COMMUNITY)
Admission: RE | Admit: 2019-06-08 | Discharge: 2019-06-08 | Disposition: A | Discharge status: Home / Self Care | Payer: Self-pay | Source: Ambulatory Visit | Attending: Gastroenterology | Admitting: Gastroenterology

## 2019-06-08 DIAGNOSIS — R1011 Right upper quadrant pain: Secondary | ICD-10-CM | POA: Insufficient documentation

## 2019-06-08 DIAGNOSIS — K5900 Constipation, unspecified: Secondary | ICD-10-CM | POA: Insufficient documentation

## 2019-06-08 DIAGNOSIS — K625 Hemorrhage of anus and rectum: Secondary | ICD-10-CM | POA: Insufficient documentation

## 2019-06-08 DIAGNOSIS — R1013 Epigastric pain: Secondary | ICD-10-CM | POA: Insufficient documentation

## 2019-06-08 DIAGNOSIS — K219 Gastro-esophageal reflux disease without esophagitis: Secondary | ICD-10-CM | POA: Insufficient documentation

## 2019-06-08 LAB — CBC WITH DIFFERENTIAL/PLATELET
Abs Immature Granulocytes: 0.03 10*3/uL (ref 0.00–0.07)
Basophils Absolute: 0 10*3/uL (ref 0.0–0.1)
Basophils Relative: 0 %
Eosinophils Absolute: 0.5 10*3/uL (ref 0.0–0.5)
Eosinophils Relative: 6 %
HCT: 40.2 % (ref 36.0–46.0)
Hemoglobin: 13.1 g/dL (ref 12.0–15.0)
Immature Granulocytes: 0 %
Lymphocytes Relative: 27 %
Lymphs Abs: 2.2 10*3/uL (ref 0.7–4.0)
MCH: 31.5 pg (ref 26.0–34.0)
MCHC: 32.6 g/dL (ref 30.0–36.0)
MCV: 96.6 fL (ref 80.0–100.0)
Monocytes Absolute: 0.6 10*3/uL (ref 0.1–1.0)
Monocytes Relative: 7 %
Neutro Abs: 4.8 10*3/uL (ref 1.7–7.7)
Neutrophils Relative %: 60 %
Platelets: 345 10*3/uL (ref 150–400)
RBC: 4.16 MIL/uL (ref 3.87–5.11)
RDW: 13.2 % (ref 11.5–15.5)
WBC: 8.1 10*3/uL (ref 4.0–10.5)
nRBC: 0 % (ref 0.0–0.2)

## 2019-06-08 LAB — BASIC METABOLIC PANEL
Anion gap: 9 (ref 5–15)
BUN: 16 mg/dL (ref 6–20)
CO2: 22 mmol/L (ref 22–32)
Calcium: 9.4 mg/dL (ref 8.9–10.3)
Chloride: 105 mmol/L (ref 98–111)
Creatinine, Ser: 0.78 mg/dL (ref 0.44–1.00)
GFR calc Af Amer: >60 mL/min (ref >60)
GFR calc non Af Amer: >60 mL/min (ref >60)
Glucose, Bld: 103 mg/dL — ABNORMAL HIGH (ref 70–99)
Potassium: 3.9 mmol/L (ref 3.5–5.1)
Sodium: 136 mmol/L (ref 135–145)

## 2019-06-08 LAB — LIPASE, BLOOD: Lipase: 21 U/L (ref 11–51)

## 2019-06-09 ENCOUNTER — Ambulatory Visit (HOSPITAL_COMMUNITY): Admission: RE | Admit: 2019-06-09 | Payer: Self-pay | Source: Ambulatory Visit

## 2019-06-23 ENCOUNTER — Telehealth: Payer: Self-pay | Admitting: Internal Medicine

## 2019-06-23 NOTE — Telephone Encounter (Signed)
Returning call. [phone]

## 2019-06-23 NOTE — Telephone Encounter (Signed)
Called pt and made her aware of results.  Pt said she missed CT scan because she had side effects from her covid vaccination.  Advised pt to reschedule her CT. Pt wanted LSL to know Linzess 290 mcg samples worked great and she would like a Artist to [employer].  She said she is out of samples.  Sent note to LSL from result note.

## 2019-07-29 ENCOUNTER — Other Ambulatory Visit: Payer: Self-pay | Admitting: Gastroenterology

## 2019-07-30 ENCOUNTER — Other Ambulatory Visit: Payer: Self-pay | Admitting: Physician Assistant

## 2019-07-30 MED ORDER — FLUTICASONE-SALMETEROL 100-50 MCG/DOSE IN AEPB: 1.0000 | INHALATION_SPRAY | Freq: Two times a day (BID) | RESPIRATORY_TRACT | 1 refills | Status: DC

## 2019-08-03 ENCOUNTER — Other Ambulatory Visit (HOSPITAL_COMMUNITY)
Admission: RE | Admit: 2019-08-03 | Discharge: 2019-08-03 | Disposition: A | Discharge status: Home / Self Care | Payer: Self-pay | Source: Ambulatory Visit | Attending: Physician Assistant | Admitting: Physician Assistant

## 2019-08-03 DIAGNOSIS — E785 Hyperlipidemia, unspecified: Secondary | ICD-10-CM | POA: Insufficient documentation

## 2019-08-03 LAB — LIPID PANEL
Cholesterol: 237 mg/dL — ABNORMAL HIGH (ref 0–200)
HDL: 38 mg/dL — ABNORMAL LOW (ref >40)
LDL Cholesterol: 168 mg/dL — ABNORMAL HIGH (ref 0–99)
Total CHOL/HDL Ratio: 6.2 RATIO
Triglycerides: 156 mg/dL — ABNORMAL HIGH (ref <150)
VLDL: 31 mg/dL (ref 0–40)

## 2019-08-03 LAB — COMPREHENSIVE METABOLIC PANEL
ALT: 20 U/L (ref 0–44)
AST: 19 U/L (ref 15–41)
Albumin: 4 g/dL (ref 3.5–5.0)
Alkaline Phosphatase: 93 U/L (ref 38–126)
Anion gap: 8 (ref 5–15)
BUN: 14 mg/dL (ref 6–20)
CO2: 22 mmol/L (ref 22–32)
Calcium: 9 mg/dL (ref 8.9–10.3)
Chloride: 107 mmol/L (ref 98–111)
Creatinine, Ser: 0.7 mg/dL (ref 0.44–1.00)
GFR calc Af Amer: >60 mL/min (ref >60)
GFR calc non Af Amer: >60 mL/min (ref >60)
Glucose, Bld: 102 mg/dL — ABNORMAL HIGH (ref 70–99)
Potassium: 4 mmol/L (ref 3.5–5.1)
Sodium: 137 mmol/L (ref 135–145)
Total Bilirubin: 0.6 mg/dL (ref 0.3–1.2)
Total Protein: 7.1 g/dL (ref 6.5–8.1)

## 2019-08-05 ENCOUNTER — Ambulatory Visit: Payer: Self-pay | Admitting: Physician Assistant

## 2019-08-05 ENCOUNTER — Encounter: Payer: Self-pay | Admitting: Physician Assistant

## 2019-08-05 ENCOUNTER — Other Ambulatory Visit: Payer: Self-pay

## 2019-08-05 VITALS — BP 110/80 | HR 96 | Temp 98.1°F | Ht 59.0 in | Wt 176.4 lb

## 2019-08-05 DIAGNOSIS — F17200 Nicotine dependence, unspecified, uncomplicated: Secondary | ICD-10-CM

## 2019-08-05 DIAGNOSIS — E785 Hyperlipidemia, unspecified: Secondary | ICD-10-CM

## 2019-08-05 DIAGNOSIS — Z1239 Encounter for other screening for malignant neoplasm of breast: Secondary | ICD-10-CM

## 2019-08-05 DIAGNOSIS — K219 Gastro-esophageal reflux disease without esophagitis: Secondary | ICD-10-CM

## 2019-08-05 DIAGNOSIS — J449 Chronic obstructive pulmonary disease, unspecified: Secondary | ICD-10-CM

## 2019-08-05 DIAGNOSIS — I10 Essential (primary) hypertension: Secondary | ICD-10-CM

## 2019-08-05 DIAGNOSIS — M79605 Pain in left leg: Secondary | ICD-10-CM

## 2019-08-05 MED ORDER — PREDNISONE 10 MG PO TABS: ORAL_TABLET | ORAL | 0 refills | Status: DC

## 2019-08-05 NOTE — Patient Instructions (Signed)

## 2019-08-05 NOTE — Progress Notes (Signed)
BP 110/80   Pulse 96   Temp 98.1 F (36.7 C)   Ht 4\' 11"  (1.499 m)   Wt 176 lb 6.4 oz (80 kg)   SpO2 98%   BMI 35.63 kg/m    Subjective:    Patient ID: Pamela Livingston, female    DOB: 1965/09/17, 54 y.o.   MRN: [id number]  HPI: Pamela Livingston is a 54 y.o. female presenting on 08/05/2019 for Hypertension, Hyperlipidemia, COPD, and Leg Pain (L leg. pt states she has been taking 9 tylenol tablets daily for this but only helps for about 30 minutes)   HPI   Pt had a negative covid 19 screening questionnaire.    Pt is 68yoF wich appt today to follow up HTN, COPD and dyslipidemia.    Pt says her breathing is pretty good.  She continues to smoke.  Pt got her covid vaccinations  Pt has been having Left leg pain x 2 months.  She says it started when she pulled a muscle medial thigh and the pain progressed to up into hip and down entire left leg.  She originnally felt the muscle pul when she stepped up one step into her laundry room.   Pt has not been seen for this previously for this.   In April, she was seen for pain in the Right leg which she says is now resolved.  Pt reports no numbness or tingling LLE.     Relevant past medical, surgical, family and social history reviewed and updated as indicated. Interim medical history since our last visit reviewed. Allergies and medications reviewed and updated.   Current Outpatient Medications:  .  acetaminophen (TYLENOL) 500 MG tablet, Take 500 mg by mouth every 6 (six) hours as needed., Disp: , Rfl:  .  albuterol (PROVENTIL) (2.5 MG/3ML) 0.083% nebulizer solution, INHALE 1 VIAL VIA NEBULIZER EVERY 6 HOURS AS NEEDED FOR WHEEZING OR SHORTNESS OF BREATH, Disp: 225 mL, Rfl: 0 .  atorvastatin (LIPITOR) 20 MG tablet, TAKE 1 Tablet BY MOUTH ONCE EVERY DAY, Disp: 90 tablet, Rfl: 1 .  cyclobenzaprine (FLEXERIL) 10 MG tablet, Take 1 tablet (10 mg total) by mouth 3 (three) times daily as needed for muscle spasms., Disp: 30 tablet, Rfl: 0 .  diphenhydrAMINE  (BENADRYL) 25 MG tablet, Take 50 mg by mouth at bedtime. , Disp: , Rfl:  .  Fluticasone-Salmeterol (ADVAIR DISKUS) 100-50 MCG/DOSE AEPB, Inhale 1 puff into the lungs in the morning and at bedtime., Disp: 60 each, Rfl: 1 .  linaclotide (LINZESS) 290 MCG CAPS capsule, Take 1 capsule (290 mcg total) by mouth daily before breakfast., Disp: 15 capsule, Rfl: 0 .  lisinopril (ZESTRIL) 20 MG tablet, Take 1 tablet (20 mg total) by mouth daily., Disp: 90 tablet, Rfl: 3 .  loratadine-pseudoephedrine (CLARITIN-D 24-HOUR) 10-240 MG 24 hr tablet, Take 1 tablet by mouth as needed. , Disp: , Rfl:  .  montelukast (SINGULAIR) 10 MG tablet, TAKE 1 Tablet BY MOUTH ONCE DAILY AS NEEDED, Disp: 90 tablet, Rfl: 1 .  nitroGLYCERIN (NITROSTAT) 0.4 MG SL tablet, Place 1 tablet (0.4 mg total) under the tongue every 5 (five) minutes as needed for chest pain., Disp: 25 tablet, Rfl: prn .  nystatin (MYCOSTATIN) 100000 UNIT/ML suspension, Take 5 mLs (500,000 Units total) by mouth 4 (four) times daily. - swish & swallow, Disp: 60 mL, Rfl: 0 .  omeprazole (PRILOSEC) 40 MG capsule, Take 1 capsule (40 mg total) by mouth 2 (two) times daily., Disp: 180 capsule, Rfl: 1 .  PROVENTIL HFA 108 (90 Base) MCG/ACT inhaler, INHALE 2 PUFFS BY MOUTH EVERY 6 HOURS AS NEEDED FOR COUGHING, WHEEZING, OR SHORTNESS OF BREATH, Disp: 20.1 g, Rfl: 1     Review of Systems  Per HPI unless specifically indicated above     Objective:    BP 110/80   Pulse 96   Temp 98.1 F (36.7 C)   Ht 4\' 11"  (1.499 m)   Wt 176 lb 6.4 oz (80 kg)   SpO2 98%   BMI 35.63 kg/m   Wt Readings from Last 3 Encounters:  08/05/19 176 lb 6.4 oz (80 kg)  05/27/19 182 lb (82.6 kg)  05/19/19 180 lb 4.8 oz (81.8 kg)    Physical Exam Vitals reviewed.  Constitutional:      Appearance: She is well-developed.  HENT:     Head: Normocephalic and atraumatic.  Cardiovascular:     Rate and Rhythm: Normal rate and regular rhythm.  Pulmonary:     Effort: Pulmonary effort is  normal.     Breath sounds: Normal breath sounds.  Abdominal:     General: Bowel sounds are normal.     Palpations: Abdomen is soft. There is no mass.     Tenderness: There is no abdominal tenderness.  Musculoskeletal:     Cervical back: Neck supple.     Lumbar back: Tenderness present.       Back:     Left hip: Tenderness present. No deformity or bony tenderness. Normal range of motion.     Left upper leg: Tenderness present. No swelling, deformity or bony tenderness.     Left knee: No swelling, deformity or erythema. Normal range of motion. Tenderness present.     Right lower leg: No swelling. No edema.     Left lower leg: No swelling. No edema.     Comments: Pt reports tenderness entirety of RLE and pain with any and all ROM testing Left hip and knee.  Lymphadenopathy:     Cervical: No cervical adenopathy.  Skin:    General: Skin is warm and dry.  Neurological:     Mental Status: She is alert and oriented to person, place, and time.     Gait: Gait abnormal.  Psychiatric:        Behavior: Behavior normal.     Results for orders placed or performed during the hospital encounter of 08/03/19  Comprehensive metabolic panel  Result Value Ref Range   Sodium 137 135 - 145 mmol/L   Potassium 4.0 3.5 - 5.1 mmol/L   Chloride 107 98 - 111 mmol/L   CO2 22 22 - 32 mmol/L   Glucose, Bld 102 (H) 70 - 99 mg/dL   BUN 14 6 - 20 mg/dL   Creatinine, Ser 0.70 0.44 - 1.00 mg/dL   Calcium 9.0 8.9 - 10.3 mg/dL   Total Protein 7.1 6.5 - 8.1 g/dL   Albumin 4.0 3.5 - 5.0 g/dL   AST 19 15 - 41 U/L   ALT 20 0 - 44 U/L   Alkaline Phosphatase 93 38 - 126 U/L   Total Bilirubin 0.6 0.3 - 1.2 mg/dL   GFR calc non Af Amer >60 >60 mL/min   GFR calc Af Amer >60 >60 mL/min   Anion gap 8 5 - 15  Lipid panel  Result Value Ref Range   Cholesterol 237 (H) 0 - 200 mg/dL   Triglycerides 156 (H) <150 mg/dL   HDL 38 (L) >40 mg/dL   Total CHOL/HDL Ratio 6.2 RATIO  VLDL 31 0 - 40 mg/dL   LDL Cholesterol  168 (H) 0 - 99 mg/dL      Assessment & Plan:    Encounter Diagnoses  Name Primary?  . Essential hypertension Yes  . Chronic obstructive pulmonary disease, unspecified COPD type ([place])   . Hyperlipidemia, unspecified hyperlipidemia type   . Tobacco use disorder   . Gastroesophageal reflux disease, unspecified whether esophagitis present   . Encounter for screening for malignant neoplasm of breast, unspecified screening modality   . Pain of left lower extremity      -reviewed labs with pt  -pt counseled to [place] and she is given reading information -pt to continue current meds -refer for screening mammogram (in august) -discussed with pt that fracture is unlikely.  Discussed that she does have risk factors for osteoporosis which could cause fracture but that is less likely.  Pt will be treated with taper of prednisone.  She can use heat/ice on it.  Avoid overexertion.  Discussed with pt that if it doesn't resolve, she needs to return to office for recheck.  She states understanding.   -pt to follow up 3 months.  She is to RTO sooner prn

## 2019-09-01 ENCOUNTER — Other Ambulatory Visit: Payer: Self-pay | Admitting: Student

## 2019-09-01 DIAGNOSIS — Z1239 Encounter for other screening for malignant neoplasm of breast: Secondary | ICD-10-CM

## 2019-09-16 ENCOUNTER — Encounter: Payer: Self-pay | Admitting: Physician Assistant

## 2019-09-16 ENCOUNTER — Other Ambulatory Visit: Payer: Self-pay

## 2019-09-16 ENCOUNTER — Ambulatory Visit: Payer: Self-pay | Admitting: Physician Assistant

## 2019-09-16 VITALS — BP 94/70 | HR 108 | Temp 98.6°F | Ht 59.0 in | Wt 173.0 lb

## 2019-09-16 DIAGNOSIS — M79605 Pain in left leg: Secondary | ICD-10-CM

## 2019-09-16 NOTE — Progress Notes (Signed)
BP 94/70   Pulse (!) 108   Temp 98.6 F (37 C)   Ht 4\' 11"  (1.499 m)   Wt 173 lb (78.5 kg)   SpO2 97%   BMI 34.94 kg/m    Subjective:    Patient ID: Pamela Livingston, female    DOB: 06-May-1965, 54 y.o.   MRN: [id number]  HPI: Pamela Livingston is a 54 y.o. female presenting on 09/16/2019 for Leg Pain   HPI    Pt had a negative covid 19 screening questionnaire.    Pt c/o 3 months LLE pain - pain is constant.  The prednisone helped "maybe 2 days".   She tried ice and heat.   She was evaluated for this at her 7/14 appointment as well.      Relevant past medical, surgical, family and social history reviewed and updated as indicated. Interim medical history since our last visit reviewed. Allergies and medications reviewed and updated.   Current Outpatient Medications:  .  acetaminophen (TYLENOL) 500 MG tablet, Take 500 mg by mouth every 6 (six) hours as needed., Disp: , Rfl:  .  albuterol (PROVENTIL) (2.5 MG/3ML) 0.083% nebulizer solution, INHALE 1 VIAL VIA NEBULIZER EVERY 6 HOURS AS NEEDED FOR WHEEZING OR SHORTNESS OF BREATH, Disp: 225 mL, Rfl: 0 .  atorvastatin (LIPITOR) 20 MG tablet, TAKE 1 Tablet BY MOUTH ONCE EVERY DAY, Disp: 90 tablet, Rfl: 1 .  cyclobenzaprine (FLEXERIL) 10 MG tablet, Take 1 tablet (10 mg total) by mouth 3 (three) times daily as needed for muscle spasms., Disp: 30 tablet, Rfl: 0 .  diphenhydrAMINE (BENADRYL) 25 MG tablet, Take 50 mg by mouth at bedtime. , Disp: , Rfl:  .  Fluticasone-Salmeterol (ADVAIR DISKUS) 100-50 MCG/DOSE AEPB, Inhale 1 puff into the lungs in the morning and at bedtime., Disp: 60 each, Rfl: 1 .  linaclotide (LINZESS) 290 MCG CAPS capsule, Take 1 capsule (290 mcg total) by mouth daily before breakfast., Disp: 15 capsule, Rfl: 0 .  lisinopril (ZESTRIL) 20 MG tablet, Take 1 tablet (20 mg total) by mouth daily., Disp: 90 tablet, Rfl: 3 .  loratadine-pseudoephedrine (CLARITIN-D 24-HOUR) 10-240 MG 24 hr tablet, Take 1 tablet by mouth as needed. ,  Disp: , Rfl:  .  montelukast (SINGULAIR) 10 MG tablet, TAKE 1 Tablet BY MOUTH ONCE DAILY AS NEEDED, Disp: 90 tablet, Rfl: 1 .  nitroGLYCERIN (NITROSTAT) 0.4 MG SL tablet, Place 1 tablet (0.4 mg total) under the tongue every 5 (five) minutes as needed for chest pain., Disp: 25 tablet, Rfl: prn .  nystatin (MYCOSTATIN) 100000 UNIT/ML suspension, Take 5 mLs (500,000 Units total) by mouth 4 (four) times daily. - swish & swallow, Disp: 60 mL, Rfl: 0 .  omeprazole (PRILOSEC) 40 MG capsule, Take 1 capsule (40 mg total) by mouth 2 (two) times daily., Disp: 180 capsule, Rfl: 1 .  PROVENTIL HFA 108 (90 Base) MCG/ACT inhaler, INHALE 2 PUFFS BY MOUTH EVERY 6 HOURS AS NEEDED FOR COUGHING, WHEEZING, OR SHORTNESS OF BREATH, Disp: 20.1 g, Rfl: 1     Review of Systems  Per HPI unless specifically indicated above     Objective:    BP 94/70   Pulse (!) 108   Temp 98.6 F (37 C)   Ht 4\' 11"  (1.499 m)   Wt 173 lb (78.5 kg)   SpO2 97%   BMI 34.94 kg/m   Wt Readings from Last 3 Encounters:  09/16/19 173 lb (78.5 kg)  08/05/19 176 lb 6.4 oz (80 kg)  05/27/19 182 lb (82.6 kg)    Physical Exam Constitutional:      General: She is not in acute distress. HENT:     Head: Normocephalic and atraumatic.  Pulmonary:     Effort: Pulmonary effort is normal. No respiratory distress.  Musculoskeletal:     Left knee: Normal.     Right lower leg: Normal.     Left lower leg: Tenderness present. No swelling or bony tenderness. No edema.     Left ankle: Normal.     Left foot: Normal pulse.  Neurological:     Mental Status: She is alert and oriented to person, place, and time.  Psychiatric:        Attention and Perception: Attention normal.        Behavior: Behavior is cooperative.            Assessment & Plan:    Encounter Diagnosis  Name Primary?  . Pain of left lower extremity Yes      -will order xray so she can get approved for [employer] financial assistance.  She is given  application for the CAFA -Refer to orthopedics due to the persistent nature of the problem and failed conservative management -she is to follow up as scheduled

## 2019-09-23 ENCOUNTER — Other Ambulatory Visit: Payer: Self-pay

## 2019-09-23 ENCOUNTER — Ambulatory Visit (HOSPITAL_COMMUNITY)
Admission: RE | Admit: 2019-09-23 | Discharge: 2019-09-23 | Disposition: A | Discharge status: Home / Self Care | Payer: Self-pay | Source: Ambulatory Visit | Attending: Physician Assistant | Admitting: Physician Assistant

## 2019-09-23 DIAGNOSIS — M79605 Pain in left leg: Secondary | ICD-10-CM | POA: Insufficient documentation

## 2019-09-30 ENCOUNTER — Other Ambulatory Visit: Payer: Self-pay | Admitting: *Deleted

## 2019-09-30 ENCOUNTER — Ambulatory Visit (INDEPENDENT_AMBULATORY_CARE_PROVIDER_SITE_OTHER): Payer: Self-pay | Admitting: Gastroenterology

## 2019-09-30 ENCOUNTER — Other Ambulatory Visit: Payer: Self-pay

## 2019-09-30 ENCOUNTER — Telehealth: Payer: Self-pay | Admitting: *Deleted

## 2019-09-30 ENCOUNTER — Encounter: Payer: Self-pay | Admitting: Gastroenterology

## 2019-09-30 VITALS — BP 120/78 | HR 90 | Temp 96.6°F | Ht 59.0 in | Wt 177.8 lb

## 2019-09-30 DIAGNOSIS — R1319 Other dysphagia: Secondary | ICD-10-CM

## 2019-09-30 DIAGNOSIS — K219 Gastro-esophageal reflux disease without esophagitis: Secondary | ICD-10-CM

## 2019-09-30 DIAGNOSIS — R1013 Epigastric pain: Secondary | ICD-10-CM

## 2019-09-30 DIAGNOSIS — R1011 Right upper quadrant pain: Secondary | ICD-10-CM

## 2019-09-30 DIAGNOSIS — K625 Hemorrhage of anus and rectum: Secondary | ICD-10-CM

## 2019-09-30 DIAGNOSIS — K5900 Constipation, unspecified: Secondary | ICD-10-CM

## 2019-09-30 DIAGNOSIS — R1310 Dysphagia, unspecified: Secondary | ICD-10-CM

## 2019-09-30 NOTE — Progress Notes (Signed)
Primary Care Physician: Soyla Dryer, PA-C  Primary Gastroenterologist:  Garfield Cornea, MD   Chief Complaint  Patient presents with  . Abdominal Pain    pain has improved some. Able to have BM's now taking linzess daily    HPI: Pamela Livingston is a 54 y.o. female here for follow-up.  Last seen in May for abdominal pain, GERD, constipation, rectal bleeding and dysphagia.   Recommended CT A/P to evaluate abdominal pain. Patient did not complete study stating she had side effects to covid vaccine. Then she tried to have done, but could not tolerate the contrast due to taste. We had plans for EGD/colonoscopy once CT completed.   Now with daily BMs on Linzess. No recent brbpr, but h/o intermittent rectal bleeding for years. Still having epigastric pain/RUQ pain as before. Feels like she has a knot in the epigastric region. Pain is worse if she bends over. Not necessarily meal related. No vomiting. Heartburn some better but still feels nausea. Still with dysphagia to solids and large pills. No weight loss. No ASA/NSAIDs. No prior EGD or colonoscopy.   Right now she is not interested in a colonoscopy, "that area is for exit only".  Current Outpatient Medications  Medication Sig Dispense Refill  . acetaminophen (TYLENOL) 500 MG tablet Take 500 mg by mouth every 6 (six) hours as needed.    Marland Kitchen albuterol (PROVENTIL) (2.5 MG/3ML) 0.083% nebulizer solution INHALE 1 VIAL VIA NEBULIZER EVERY 6 HOURS AS NEEDED FOR WHEEZING OR SHORTNESS OF BREATH 225 mL 0  . atorvastatin (LIPITOR) 20 MG tablet TAKE 1 Tablet BY MOUTH ONCE EVERY DAY 90 tablet 1  . cyclobenzaprine (FLEXERIL) 10 MG tablet Take 1 tablet (10 mg total) by mouth 3 (three) times daily as needed for muscle spasms. 30 tablet 0  . diphenhydrAMINE (BENADRYL) 25 MG tablet Take 50 mg by mouth at bedtime.     . Fluticasone-Salmeterol (ADVAIR DISKUS) 100-50 MCG/DOSE AEPB Inhale 1 puff into the lungs in the morning and at bedtime. 60 each 1  .  linaclotide (LINZESS) 290 MCG CAPS capsule Take 1 capsule (290 mcg total) by mouth daily before breakfast. 15 capsule 0  . loratadine-pseudoephedrine (CLARITIN-D 24-HOUR) 10-240 MG 24 hr tablet Take 1 tablet by mouth as needed.     . montelukast (SINGULAIR) 10 MG tablet TAKE 1 Tablet BY MOUTH ONCE DAILY AS NEEDED 90 tablet 1  . nitroGLYCERIN (NITROSTAT) 0.4 MG SL tablet Place 1 tablet (0.4 mg total) under the tongue every 5 (five) minutes as needed for chest pain. 25 tablet prn  . nystatin (MYCOSTATIN) 100000 UNIT/ML suspension Take 5 mLs (500,000 Units total) by mouth 4 (four) times daily. - swish & swallow 60 mL 0  . omeprazole (PRILOSEC) 40 MG capsule Take 1 capsule (40 mg total) by mouth 2 (two) times daily. 180 capsule 1  . PROVENTIL HFA 108 (90 Base) MCG/ACT inhaler INHALE 2 PUFFS BY MOUTH EVERY 6 HOURS AS NEEDED FOR COUGHING, WHEEZING, OR SHORTNESS OF BREATH 20.1 g 1  . lisinopril (ZESTRIL) 20 MG tablet Take 1 tablet (20 mg total) by mouth daily. 90 tablet 3   No current facility-administered medications for this visit.    Allergies as of 09/30/2019 - Review Complete 09/30/2019  Allergen Reaction Noted  . Asa [aspirin] Anaphylaxis, Hives, and Swelling 08/16/2011   Past Medical History:  Diagnosis Date  . Asthma   . Chronic pelvic pain in female   . COPD (chronic obstructive pulmonary disease) ([place])   .  Endometriosis   . Polysubstance abuse ([facility])    marijuana, cocaine, benzos  . Stomach ulcer   . Suicide attempt ([place]) 2008   by phenergan overdose  . Syncope    Past Surgical History:  Procedure Laterality Date  . ABDOMINAL HYSTERECTOMY    . CERVICAL CONE BIOPSY     cervical cancer  . CESAREAN SECTION  1993  . CHOLECYSTECTOMY    . RADICAL HYSTERECTOMY WITH TRANSPOSITION OF OVARIES    . TUBAL LIGATION  1993   Family History  Problem Relation Age of Onset  . Heart disease Mother   . Hypertension Mother   . Diabetes Mother   . Hypertension Father   . Heart attack  Maternal Uncle   . COPD Paternal Grandfather   . Heart attack Maternal Uncle   . Colon cancer Neg Hx    Social History   Tobacco Use  . Smoking status: Current Every Day Smoker    Packs/day: 0.25    Years: 27.00    Pack years: 6.75    Types: Cigarettes  . Smokeless tobacco: Never Used  Substance Use Topics  . Alcohol use: Not Currently    Comment:  never  . Drug use: No    Types: Marijuana, Cocaine, "Crack" cocaine    Comment: past use QUIT DATE 04-2008 (UDS positive 02/2015)    ROS:  General: Negative for anorexia, weight loss, fever, chills, fatigue, weakness. ENT: Negative for hoarseness, difficulty swallowing , nasal congestion. CV: Negative for chest pain, angina, palpitations, dyspnea on exertion, peripheral edema.  Respiratory: Negative for dyspnea at rest, dyspnea on exertion, cough, sputum, wheezing.  GI: See history of present illness. GU:  Negative for dysuria, hematuria, urinary incontinence, urinary frequency, nocturnal urination.  Endo: Negative for unusual weight change.    Physical Examination:   BP 124/89   Pulse 90   Temp (!) 96.6 F (35.9 C)   Ht 4\' 11"  (1.499 m)   Wt 177 lb 12.8 oz (80.6 kg)   BMI 35.91 kg/m   General: Well-nourished, well-developed in no acute distress.  Eyes: No icterus. Mouth: Oropharyngeal mucosa moist and pink , no lesions erythema or exudate. Lungs: Clear to auscultation bilaterally.  Heart: Regular rate and rhythm, no murmurs rubs or gallops.  Abdomen: Bowel sounds are normal, nondistended, no hepatosplenomegaly or masses, no abdominal bruits or hernia , no rebound or guarding.  Moderate epigastric/ruq tenderness. Some mild tenderness in lower abdomen.  Extremities: No lower extremity edema. No clubbing or deformities. Neuro: Alert and oriented x 4   Skin: Warm and dry, no jaundice.   Psych: Alert and cooperative, normal mood and affect.  Labs:  Lab Results  Component Value Date   CREATININE 0.70 08/03/2019   BUN 14  08/03/2019   NA 137 08/03/2019   K 4.0 08/03/2019   CL 107 08/03/2019   CO2 22 08/03/2019   Lab Results  Component Value Date   ALT 20 08/03/2019   AST 19 08/03/2019   ALKPHOS 93 08/03/2019   BILITOT 0.6 08/03/2019   Lab Results  Component Value Date   WBC 8.1 06/08/2019   HGB 13.1 06/08/2019   HCT 40.2 06/08/2019   MCV 96.6 06/08/2019   PLT 345 06/08/2019   Lab Results  Component Value Date   LIPASE 21 06/08/2019     Imaging Studies: DG Tibia/Fibula Left  Result Date: 09/23/2019 CLINICAL DATA:  Left lower leg pain with no known injury x 3 months EXAM: LEFT TIBIA AND FIBULA - 2  VIEW COMPARISON:  None. FINDINGS: There is no evidence of fracture or other focal bone lesions. Soft tissues are unremarkable. IMPRESSION: Negative. Electronically Signed   By: Audie Pinto M.D.   On: 09/23/2019 16:51   Impression/Plan:  Pleasant 54 y/o female with several month history of ongoing epigastric/RUQ pain with no improvement in PPI BID. She denies ASA/NSAIDS. She has already had gallbladder removed. She also has solid food dysphagia and chronic GERD now on double dose PPI to control symptoms. Recommend she complete CT A/P with contrast as previously ordered. Will ask radiology to give her water based contrast as she may be able to tolerate better. Plan for upper endoscopy with esophageal dilation with propofol, ASA III.  I have discussed the risks, alternatives, benefits with regards to but not limited to the risk of reaction to medication, bleeding, infection, perforation and the patient is agreeable to proceed. Written consent to be obtained.  She has better control of constipation on Linzess. No recent brbpr. Advised colonoscopy still recommended, right now she declines but may be open to idea in future once current work up complete.

## 2019-09-30 NOTE — Addendum Note (Signed)
Addended by: Cheron Every on: 09/30/2019 04:29 PM   Modules accepted: Orders

## 2019-09-30 NOTE — Patient Instructions (Signed)
1. CT scan to be rescheduled.  2. Upper endoscopy to be scheduled. See separate instructions.  3. Please consider colonoscopy for further evaluation of chronic rectal bleeding. We can discuss once you complete your current work up.

## 2019-09-30 NOTE — Progress Notes (Signed)
Cc'ed to pcp °

## 2019-09-30 NOTE — Telephone Encounter (Signed)
Called Radiology. She can have water based contrast but will need to arrive 2 hours prior to appt to drink at radiology. CT has been rescheduled to 9/27 at 12:00pm, arrival 9:45am, npo 4 hrs prior.  Called pt and she is aware of appt details and why she needs to arrive 2 hrs prior. She also has been scheduled for EGD/ED with Dr. Gala Romney, propofol, on 11/8 at 12:00pm. Patient aware will mail instructions with pre-op/covid test appt. Confirmed mailing address.

## 2019-10-01 ENCOUNTER — Encounter: Payer: Self-pay | Admitting: *Deleted

## 2019-10-05 ENCOUNTER — Ambulatory Visit (HOSPITAL_COMMUNITY)
Admission: RE | Admit: 2019-10-05 | Discharge: 2019-10-05 | Disposition: A | Discharge status: Home / Self Care | Payer: Self-pay | Source: Ambulatory Visit | Attending: Physician Assistant | Admitting: Physician Assistant

## 2019-10-05 ENCOUNTER — Other Ambulatory Visit: Payer: Self-pay

## 2019-10-05 DIAGNOSIS — Z1239 Encounter for other screening for malignant neoplasm of breast: Secondary | ICD-10-CM

## 2019-10-19 ENCOUNTER — Ambulatory Visit (HOSPITAL_COMMUNITY)
Admission: RE | Admit: 2019-10-19 | Discharge: 2019-10-19 | Disposition: A | Discharge status: Home / Self Care | Payer: Self-pay | Source: Ambulatory Visit | Attending: Gastroenterology | Admitting: Gastroenterology

## 2019-10-19 ENCOUNTER — Other Ambulatory Visit: Payer: Self-pay

## 2019-10-19 DIAGNOSIS — R1011 Right upper quadrant pain: Secondary | ICD-10-CM | POA: Insufficient documentation

## 2019-10-19 LAB — POCT I-STAT CREATININE: Creatinine, Ser: 0.9 mg/dL (ref 0.44–1.00)

## 2019-10-19 MED ORDER — IOHEXOL 9 MG/ML PO SOLN
ORAL | Status: AC
  Filled 2019-10-19: qty 1000

## 2019-10-19 MED ORDER — IOHEXOL 300 MG/ML  SOLN
100.0000 mL | Freq: Once | INTRAMUSCULAR | Status: AC | PRN
  Administered 2019-10-19: 100 mL via INTRAVENOUS

## 2019-10-28 ENCOUNTER — Other Ambulatory Visit: Payer: Self-pay | Admitting: Physician Assistant

## 2019-10-29 ENCOUNTER — Other Ambulatory Visit: Payer: Self-pay

## 2019-11-03 ENCOUNTER — Other Ambulatory Visit (HOSPITAL_COMMUNITY)
Admission: RE | Admit: 2019-11-03 | Discharge: 2019-11-03 | Disposition: A | Discharge status: Home / Self Care | Payer: Self-pay | Source: Ambulatory Visit | Attending: Physician Assistant | Admitting: Physician Assistant

## 2019-11-03 ENCOUNTER — Other Ambulatory Visit: Payer: Self-pay

## 2019-11-03 DIAGNOSIS — I10 Essential (primary) hypertension: Secondary | ICD-10-CM | POA: Insufficient documentation

## 2019-11-03 DIAGNOSIS — E785 Hyperlipidemia, unspecified: Secondary | ICD-10-CM | POA: Insufficient documentation

## 2019-11-03 LAB — COMPREHENSIVE METABOLIC PANEL
ALT: 13 U/L (ref 0–44)
AST: 13 U/L — ABNORMAL LOW (ref 15–41)
Albumin: 3.9 g/dL (ref 3.5–5.0)
Alkaline Phosphatase: 80 U/L (ref 38–126)
Anion gap: 10 (ref 5–15)
BUN: 19 mg/dL (ref 6–20)
CO2: 22 mmol/L (ref 22–32)
Calcium: 9.3 mg/dL (ref 8.9–10.3)
Chloride: 106 mmol/L (ref 98–111)
Creatinine, Ser: 0.87 mg/dL (ref 0.44–1.00)
GFR, Estimated: >60 mL/min (ref >60)
Glucose, Bld: 104 mg/dL — ABNORMAL HIGH (ref 70–99)
Potassium: 3.9 mmol/L (ref 3.5–5.1)
Sodium: 138 mmol/L (ref 135–145)
Total Bilirubin: 0.7 mg/dL (ref 0.3–1.2)
Total Protein: 7 g/dL (ref 6.5–8.1)

## 2019-11-03 LAB — LIPID PANEL
Cholesterol: 149 mg/dL (ref 0–200)
HDL: 39 mg/dL — ABNORMAL LOW (ref >40)
LDL Cholesterol: 89 mg/dL (ref 0–99)
Total CHOL/HDL Ratio: 3.8 RATIO
Triglycerides: 107 mg/dL (ref <150)
VLDL: 21 mg/dL (ref 0–40)

## 2019-11-05 ENCOUNTER — Ambulatory Visit: Payer: Self-pay | Admitting: Physician Assistant

## 2019-11-05 ENCOUNTER — Encounter: Payer: Self-pay | Admitting: Physician Assistant

## 2019-11-05 DIAGNOSIS — F17200 Nicotine dependence, unspecified, uncomplicated: Secondary | ICD-10-CM

## 2019-11-05 DIAGNOSIS — E785 Hyperlipidemia, unspecified: Secondary | ICD-10-CM

## 2019-11-05 DIAGNOSIS — K219 Gastro-esophageal reflux disease without esophagitis: Secondary | ICD-10-CM

## 2019-11-05 DIAGNOSIS — J449 Chronic obstructive pulmonary disease, unspecified: Secondary | ICD-10-CM

## 2019-11-05 DIAGNOSIS — I10 Essential (primary) hypertension: Secondary | ICD-10-CM

## 2019-11-05 NOTE — Progress Notes (Signed)
There were no vitals taken for this visit.   Subjective:    Patient ID: Pamela Livingston, female    DOB: 06-07-1965, 54 y.o.   MRN: [id number]  HPI: Pamela Livingston is a 54 y.o. female presenting on 11/05/2019 for No chief complaint on file.   HPI    This is a telemedicine appointment due to coronavirus pandemic.  It is via Telephone as pt was unable to get connected with video today.  I connected with  Pamela Livingston on 11/05/19 by a video enabled telemedicine application and verified that I am speaking with the correct person using two identifiers.   I discussed the limitations of evaluation and management by telemedicine. The patient expressed understanding and agreed to proceed.  Pt is at home.  Provider is at office.    Pt appointment for routine follow up HTN, dyslipidemia and COPD.   Pt was scheduled for in office appointment but she declined to get tested for covid after an exposure last week so her appointment was changed to virtual.    She says her Breathing is a bit bad due to weather changing.  She is Still smoking.  She says overall she is doing well and feeling good.     Relevant past medical, surgical, family and social history reviewed and updated as indicated. Interim medical history since our last visit reviewed. Allergies and medications reviewed and updated.   Current Outpatient Medications:  .  acetaminophen (TYLENOL) 500 MG tablet, Take 500 mg by mouth every 6 (six) hours as needed., Disp: , Rfl:  .  albuterol (PROVENTIL) (2.5 MG/3ML) 0.083% nebulizer solution, INHALE 1 VIAL VIA NEBULIZER EVERY 6 HOURS AS NEEDED FOR WHEEZING OR SHORTNESS OF BREATH, Disp: 225 mL, Rfl: 0 .  atorvastatin (LIPITOR) 20 MG tablet, TAKE 1 Tablet BY MOUTH ONCE EVERY DAY, Disp: 90 tablet, Rfl: 1 .  cyclobenzaprine (FLEXERIL) 10 MG tablet, Take 1 tablet (10 mg total) by mouth 3 (three) times daily as needed for muscle spasms., Disp: 30 tablet, Rfl: 0 .  diphenhydrAMINE (BENADRYL) 25 MG  tablet, Take 50 mg by mouth at bedtime. , Disp: , Rfl:  .  Fluticasone-Salmeterol (ADVAIR DISKUS) 100-50 MCG/DOSE AEPB, Inhale 1 puff into the lungs in the morning and at bedtime., Disp: 60 each, Rfl: 1 .  linaclotide (LINZESS) 290 MCG CAPS capsule, Take 1 capsule (290 mcg total) by mouth daily before breakfast., Disp: 15 capsule, Rfl: 0 .  lisinopril (ZESTRIL) 20 MG tablet, Take 1 tablet (20 mg total) by mouth daily., Disp: 90 tablet, Rfl: 3 .  loratadine-pseudoephedrine (CLARITIN-D 24-HOUR) 10-240 MG 24 hr tablet, Take 1 tablet by mouth as needed. , Disp: , Rfl:  .  montelukast (SINGULAIR) 10 MG tablet, TAKE 1 Tablet BY MOUTH ONCE DAILY AS NEEDED, Disp: 90 tablet, Rfl: 1 .  omeprazole (PRILOSEC) 40 MG capsule, TAKE 1 Capsule  BY MOUTH TWICE DAILY, Disp: 180 capsule, Rfl: 1 .  PROVENTIL HFA 108 (90 Base) MCG/ACT inhaler, INHALE 2 PUFFS BY MOUTH EVERY 6 HOURS AS NEEDED FOR COUGHING, WHEEZING, OR SHORTNESS OF BREATH, Disp: 20.1 g, Rfl: 1 .  nitroGLYCERIN (NITROSTAT) 0.4 MG SL tablet, Place 1 tablet (0.4 mg total) under the tongue every 5 (five) minutes as needed for chest pain. (Patient not taking: Reported on 11/05/2019), Disp: 25 tablet, Rfl: prn .  nystatin (MYCOSTATIN) 100000 UNIT/ML suspension, Take 5 mLs (500,000 Units total) by mouth 4 (four) times daily. - swish & swallow (Patient not taking: Reported on  11/05/2019), Disp: 60 mL, Rfl: 0   Review of Systems  Per HPI unless specifically indicated above     Objective:    There were no vitals taken for this visit.  Wt Readings from Last 3 Encounters:  09/30/19 177 lb 12.8 oz (80.6 kg)  09/16/19 173 lb (78.5 kg)  08/05/19 176 lb 6.4 oz (80 kg)    Physical Exam Pulmonary:     Effort: No respiratory distress.     Comments: Pt is talking in complete sentences without dyspnea.  Neurological:     Mental Status: She is alert and oriented to person, place, and time.  Psychiatric:        Attention and Perception: Attention normal.         Speech: Speech normal.        Behavior: Behavior is cooperative.     Results for orders placed or performed during the hospital encounter of 11/03/19  Lipid panel  Result Value Ref Range   Cholesterol 149 0 - 200 mg/dL   Triglycerides 107 <150 mg/dL   HDL 39 (L) >40 mg/dL   Total CHOL/HDL Ratio 3.8 RATIO   VLDL 21 0 - 40 mg/dL   LDL Cholesterol 89 0 - 99 mg/dL  Comprehensive metabolic panel  Result Value Ref Range   Sodium 138 135 - 145 mmol/L   Potassium 3.9 3.5 - 5.1 mmol/L   Chloride 106 98 - 111 mmol/L   CO2 22 22 - 32 mmol/L   Glucose, Bld 104 (H) 70 - 99 mg/dL   BUN 19 6 - 20 mg/dL   Creatinine, Ser 0.87 0.44 - 1.00 mg/dL   Calcium 9.3 8.9 - 10.3 mg/dL   Total Protein 7.0 6.5 - 8.1 g/dL   Albumin 3.9 3.5 - 5.0 g/dL   AST 13 (L) 15 - 41 U/L   ALT 13 0 - 44 U/L   Alkaline Phosphatase 80 38 - 126 U/L   Total Bilirubin 0.7 0.3 - 1.2 mg/dL   GFR, Estimated >60 >60 mL/min   Anion gap 10 5 - 15      Assessment & Plan:   Encounter Diagnoses  Name Primary?  . Essential hypertension Yes  . Chronic obstructive pulmonary disease, unspecified COPD type ([place])   . Hyperlipidemia, unspecified hyperlipidemia type   . Tobacco use disorder   . Gastroesophageal reflux disease, unspecified whether esophagitis present      -reviewed labs with pt -pt to Continue current meds -pt to continue with GI per their recomendations -encouraged Smoking cessation -pt to follow up 3-4 months.  She is to contact office sooner prn

## 2019-11-26 NOTE — Patient Instructions (Signed)
ELVIN BANKER  11/26/2019     @PREFPERIOPPHARMACY @   Your procedure is scheduled on  11/30/2019.  Report to [facility] at  1030  A.M.  Call this number if you have problems the morning of surgery:  [phone]   Remember:  Follow the diet instructions given to you by the office.                     Take these medicines the morning of surgery with A SIP OF WATER  Claritin, prilosec.    Do not wear jewelry, make-up or nail polish.  Do not wear lotions, powders, or perfumes. Please wear deodorant and brush your teeth.  Do not shave 48 hours prior to surgery.  Men may shave face and neck.  Do not bring valuables to the hospital.  [facility] is not responsible for any belongings or valuables.  Contacts, dentures or bridgework may not be worn into surgery.  Leave your suitcase in the car.  After surgery it may be brought to your room.  For patients admitted to the hospital, discharge time will be determined by your treatment team.  Patients discharged the day of surgery will not be allowed to drive home.   Name and phone number of your driver:   family Special instructions:  DO NOT smoke the morning before your procedure.  Please read over the following fact sheets that you were given. Anesthesia Post-op Instructions and Care and Recovery After Surgery       Upper Endoscopy, Adult, Care After This sheet gives you information about how to care for yourself after your procedure. Your health care provider may also give you more specific instructions. If you have problems or questions, contact your health care provider. What can I expect after the procedure? After the procedure, it is common to have:  A sore throat.  Mild stomach pain or discomfort.  Bloating.  Nausea. Follow these instructions at home:   Follow instructions from your health care provider about what to eat or drink after your procedure.  Return to your normal activities as told by your health  care provider. Ask your health care provider what activities are safe for you.  Take over-the-counter and prescription medicines only as told by your health care provider.  Do not drive for 24 hours if you were given a sedative during your procedure.  Keep all follow-up visits as told by your health care provider. This is important. Contact a health care provider if you have:  A sore throat that lasts longer than one day.  Trouble swallowing. Get help right away if:  You vomit blood or your vomit looks like coffee grounds.  You have: ? A fever. ? Bloody, black, or tarry stools. ? A severe sore throat or you cannot swallow. ? Difficulty breathing. ? Severe pain in your chest or abdomen. Summary  After the procedure, it is common to have a sore throat, mild stomach discomfort, bloating, and nausea.  Do not drive for 24 hours if you were given a sedative during the procedure.  Follow instructions from your health care provider about what to eat or drink after your procedure.  Return to your normal activities as told by your health care provider. This information is not intended to replace advice given to you by your health care provider. Make sure you discuss any questions you have with your health care provider. Document Revised: 07/02/2017 Document Reviewed:  06/10/2017 Elsevier Patient Education  [place].  Esophageal Dilatation Esophageal dilatation, also called esophageal dilation, is a procedure to widen or open (dilate) a blocked or narrowed part of the esophagus. The esophagus is the part of the body that moves food and liquid from the mouth to the stomach. You may need this procedure if:  You have a buildup of scar tissue in your esophagus that makes it difficult, painful, or impossible to swallow. This can be caused by gastroesophageal reflux disease (GERD).  You have cancer of the esophagus.  There is a problem with how food moves through your esophagus. In  some cases, you may need this procedure repeated at a later time to dilate the esophagus gradually. Tell a health care provider about:  Any allergies you have.  All medicines you are taking, including vitamins, herbs, eye drops, creams, and over-the-counter medicines.  Any problems you or family members have had with anesthetic medicines.  Any blood disorders you have.  Any surgeries you have had.  Any medical conditions you have.  Any antibiotic medicines you are required to take before dental procedures.  Whether you are pregnant or may be pregnant. What are the risks? Generally, this is a safe procedure. However, problems may occur, including:  Bleeding due to a tear in the lining of the esophagus.  A hole (perforation) in the esophagus. What happens before the procedure?  Follow instructions from your health care provider about eating or drinking restrictions.  Ask your health care provider about changing or stopping your regular medicines. This is especially important if you are taking diabetes medicines or blood thinners.  Plan to have someone take you home from the hospital or clinic.  Plan to have a responsible adult care for you for at least 24 hours after you leave the hospital or clinic. This is important. What happens during the procedure?  You may be given a medicine to help you relax (sedative).  A numbing medicine may be sprayed into the back of your throat, or you may gargle the medicine.  Your health care provider may perform the dilatation using various surgical instruments, such as: ? Simple dilators. This instrument is carefully placed in the esophagus to stretch it. ? Guided wire bougies. This involves using an endoscope to insert a wire into the esophagus. A dilator is passed over this wire to enlarge the esophagus. Then the wire is removed. ? Balloon dilators. An endoscope with a small balloon at the end is inserted into the esophagus. The balloon is  inflated to stretch the esophagus and open it up. The procedure may vary among health care providers and hospitals. What happens after the procedure?  Your blood pressure, heart rate, breathing rate, and blood oxygen level will be monitored until the medicines you were given have worn off.  Your throat may feel slightly sore and numb. This will improve slowly over time.  You will not be allowed to eat or drink until your throat is no longer numb.  When you are able to drink, urinate, and sit on the edge of the bed without nausea or dizziness, you may be able to return home. Follow these instructions at home:  Take over-the-counter and prescription medicines only as told by your health care provider.  Do not drive for 24 hours if you were given a sedative during your procedure.  You should have a responsible adult with you for 24 hours after the procedure.  Follow instructions from your health care  provider about any eating or drinking restrictions.  Do not use any products that contain nicotine or tobacco, such as cigarettes and e-cigarettes. If you need help quitting, ask your health care provider.  Keep all follow-up visits as told by your health care provider. This is important. Get help right away if you:  Have a fever.  Have chest pain.  Have pain that is not relieved by medication.  Have trouble breathing.  Have trouble swallowing.  Vomit blood. Summary  Esophageal dilatation, also called esophageal dilation, is a procedure to widen or open (dilate) a blocked or narrowed part of the esophagus.  Plan to have someone take you home from the hospital or clinic.  For this procedure, a numbing medicine may be sprayed into the back of your throat, or you may gargle the medicine.  Do not drive for 24 hours if you were given a sedative during your procedure. This information is not intended to replace advice given to you by your health care provider. Make sure you discuss  any questions you have with your health care provider. Document Revised: 11/05/2018 Document Reviewed: 11/13/2016 Elsevier Patient Education  2020 [place] After These instructions provide you with information about caring for yourself after your procedure. Your health care provider may also give you more specific instructions. Your treatment has been planned according to current medical practices, but problems sometimes occur. Call your health care provider if you have any problems or questions after your procedure. What can I expect after the procedure? After your procedure, you may:  Feel sleepy for several hours.  Feel clumsy and have poor balance for several hours.  Feel forgetful about what happened after the procedure.  Have poor judgment for several hours.  Feel nauseous or vomit.  Have a sore throat if you had a breathing tube during the procedure. Follow these instructions at home: For at least 24 hours after the procedure:      Have a responsible adult stay with you. It is important to have someone help care for you until you are awake and alert.  Rest as needed.  Do not: ? Participate in activities in which you could fall or become injured. ? Drive. ? Use heavy machinery. ? Drink alcohol. ? Take sleeping pills or medicines that cause drowsiness. ? Make important decisions or sign legal documents. ? Take care of children on your own. Eating and drinking  Follow the diet that is recommended by your health care provider.  If you vomit, drink water, juice, or soup when you can drink without vomiting.  Make sure you have little or no nausea before eating solid foods. General instructions  Take over-the-counter and prescription medicines only as told by your health care provider.  If you have sleep apnea, surgery and certain medicines can increase your risk for breathing problems. Follow instructions from your health care  provider about wearing your sleep device: ? Anytime you are sleeping, including during daytime naps. ? While taking prescription pain medicines, sleeping medicines, or medicines that make you drowsy.  If you smoke, do not smoke without supervision.  Keep all follow-up visits as told by your health care provider. This is important. Contact a health care provider if:  You keep feeling nauseous or you keep vomiting.  You feel light-headed.  You develop a rash.  You have a fever. Get help right away if:  You have trouble breathing. Summary  For several hours after your procedure, you may  feel sleepy and have poor judgment.  Have a responsible adult stay with you for at least 24 hours or until you are awake and alert. This information is not intended to replace advice given to you by your health care provider. Make sure you discuss any questions you have with your health care provider. Document Revised: 04/08/2017 Document Reviewed: 05/01/2015 Elsevier Patient Education  [place].

## 2019-11-27 ENCOUNTER — Other Ambulatory Visit: Payer: Self-pay

## 2019-11-27 ENCOUNTER — Encounter (HOSPITAL_COMMUNITY): Payer: Self-pay

## 2019-11-27 ENCOUNTER — Other Ambulatory Visit (HOSPITAL_COMMUNITY)
Admission: RE | Admit: 2019-11-27 | Discharge: 2019-11-27 | Disposition: A | Discharge status: Home / Self Care | Payer: Self-pay | Source: Ambulatory Visit | Attending: Internal Medicine | Admitting: Internal Medicine

## 2019-11-27 ENCOUNTER — Encounter (HOSPITAL_COMMUNITY)
Admission: RE | Admit: 2019-11-27 | Discharge: 2019-11-27 | Disposition: A | Discharge status: Home / Self Care | Payer: HRSA Program | Source: Ambulatory Visit | Attending: Internal Medicine | Admitting: Internal Medicine

## 2019-11-27 DIAGNOSIS — Z20822 Contact with and (suspected) exposure to covid-19: Secondary | ICD-10-CM | POA: Diagnosis not present

## 2019-11-27 DIAGNOSIS — Z01812 Encounter for preprocedural laboratory examination: Secondary | ICD-10-CM | POA: Insufficient documentation

## 2019-11-27 LAB — SARS CORONAVIRUS 2 (TAT 6-24 HRS): SARS Coronavirus 2: NEGATIVE

## 2019-11-29 ENCOUNTER — Encounter (HOSPITAL_COMMUNITY): Payer: Self-pay | Admitting: Anesthesiology

## 2019-11-30 ENCOUNTER — Ambulatory Visit (HOSPITAL_COMMUNITY): Admission: RE | Admit: 2019-11-30 | Payer: Self-pay | Source: Home / Self Care | Admitting: Internal Medicine

## 2019-11-30 ENCOUNTER — Encounter (HOSPITAL_COMMUNITY): Admission: RE | Payer: Self-pay | Source: Home / Self Care

## 2019-11-30 ENCOUNTER — Telehealth: Payer: Self-pay | Admitting: *Deleted

## 2019-11-30 SURGERY — ESOPHAGOGASTRODUODENOSCOPY (EGD) WITH PROPOFOL
Anesthesia: Monitor Anesthesia Care

## 2019-11-30 NOTE — Telephone Encounter (Signed)
Received call from endo patient cancelled procedure for today and needs to r/s with Dr. Gala Romney. Egd/ed with propofol, ASA 3   Called pt and LMOVM

## 2019-12-01 NOTE — Telephone Encounter (Signed)
LMOVM

## 2019-12-01 NOTE — Telephone Encounter (Signed)
Letter mailed

## 2019-12-02 ENCOUNTER — Encounter: Payer: Self-pay | Admitting: Orthopaedic Surgery

## 2019-12-07 ENCOUNTER — Other Ambulatory Visit: Payer: Self-pay | Admitting: Cardiovascular Disease

## 2019-12-20 ENCOUNTER — Emergency Department (HOSPITAL_COMMUNITY): Payer: Self-pay

## 2019-12-20 ENCOUNTER — Encounter (HOSPITAL_COMMUNITY): Payer: Self-pay

## 2019-12-20 ENCOUNTER — Other Ambulatory Visit: Payer: Self-pay

## 2019-12-20 ENCOUNTER — Emergency Department (HOSPITAL_COMMUNITY)
Admission: EM | Admit: 2019-12-20 | Discharge: 2019-12-20 | Disposition: A | Discharge status: Home / Self Care | Payer: Self-pay | Attending: Emergency Medicine | Admitting: Emergency Medicine

## 2019-12-20 DIAGNOSIS — I10 Essential (primary) hypertension: Secondary | ICD-10-CM | POA: Insufficient documentation

## 2019-12-20 DIAGNOSIS — Z20822 Contact with and (suspected) exposure to covid-19: Secondary | ICD-10-CM | POA: Insufficient documentation

## 2019-12-20 DIAGNOSIS — Z79899 Other long term (current) drug therapy: Secondary | ICD-10-CM | POA: Insufficient documentation

## 2019-12-20 DIAGNOSIS — J189 Pneumonia, unspecified organism: Secondary | ICD-10-CM

## 2019-12-20 DIAGNOSIS — J181 Lobar pneumonia, unspecified organism: Secondary | ICD-10-CM | POA: Insufficient documentation

## 2019-12-20 DIAGNOSIS — R000 Tachycardia, unspecified: Secondary | ICD-10-CM | POA: Insufficient documentation

## 2019-12-20 DIAGNOSIS — J441 Chronic obstructive pulmonary disease with (acute) exacerbation: Secondary | ICD-10-CM

## 2019-12-20 DIAGNOSIS — J45909 Unspecified asthma, uncomplicated: Secondary | ICD-10-CM | POA: Insufficient documentation

## 2019-12-20 DIAGNOSIS — F17210 Nicotine dependence, cigarettes, uncomplicated: Secondary | ICD-10-CM | POA: Insufficient documentation

## 2019-12-20 LAB — COMPREHENSIVE METABOLIC PANEL
ALT: 17 U/L (ref 0–44)
AST: 15 U/L (ref 15–41)
Albumin: 4.1 g/dL (ref 3.5–5.0)
Alkaline Phosphatase: 84 U/L (ref 38–126)
Anion gap: 10 (ref 5–15)
BUN: 15 mg/dL (ref 6–20)
CO2: 21 mmol/L — ABNORMAL LOW (ref 22–32)
Calcium: 9.4 mg/dL (ref 8.9–10.3)
Chloride: 104 mmol/L (ref 98–111)
Creatinine, Ser: 0.75 mg/dL (ref 0.44–1.00)
GFR, Estimated: >60 mL/min (ref >60)
Glucose, Bld: 120 mg/dL — ABNORMAL HIGH (ref 70–99)
Potassium: 3.5 mmol/L (ref 3.5–5.1)
Sodium: 135 mmol/L (ref 135–145)
Total Bilirubin: 0.5 mg/dL (ref 0.3–1.2)
Total Protein: 7.8 g/dL (ref 6.5–8.1)

## 2019-12-20 LAB — RESP PANEL BY RT-PCR (FLU A&B, COVID) ARPGX2
Influenza A by PCR: NEGATIVE
Influenza B by PCR: NEGATIVE
SARS Coronavirus 2 by RT PCR: NEGATIVE

## 2019-12-20 LAB — CBC WITH DIFFERENTIAL/PLATELET
Abs Immature Granulocytes: 0.04 10*3/uL (ref 0.00–0.07)
Basophils Absolute: 0.1 10*3/uL (ref 0.0–0.1)
Basophils Relative: 0 %
Eosinophils Absolute: 0.1 10*3/uL (ref 0.0–0.5)
Eosinophils Relative: 1 %
HCT: 38.8 % (ref 36.0–46.0)
Hemoglobin: 12.9 g/dL (ref 12.0–15.0)
Immature Granulocytes: 0 %
Lymphocytes Relative: 8 %
Lymphs Abs: 1.3 10*3/uL (ref 0.7–4.0)
MCH: 32.3 pg (ref 26.0–34.0)
MCHC: 33.2 g/dL (ref 30.0–36.0)
MCV: 97 fL (ref 80.0–100.0)
Monocytes Absolute: 1.2 10*3/uL — ABNORMAL HIGH (ref 0.1–1.0)
Monocytes Relative: 8 %
Neutro Abs: 13 10*3/uL — ABNORMAL HIGH (ref 1.7–7.7)
Neutrophils Relative %: 83 %
Platelets: 317 10*3/uL (ref 150–400)
RBC: 4 MIL/uL (ref 3.87–5.11)
RDW: 13.2 % (ref 11.5–15.5)
WBC: 15.7 10*3/uL — ABNORMAL HIGH (ref 4.0–10.5)
nRBC: 0 % (ref 0.0–0.2)

## 2019-12-20 LAB — C-REACTIVE PROTEIN: CRP: 7.2 mg/dL — ABNORMAL HIGH (ref <1.0)

## 2019-12-20 LAB — FERRITIN: Ferritin: 82 ng/mL (ref 11–307)

## 2019-12-20 LAB — FIBRINOGEN: Fibrinogen: 732 mg/dL — ABNORMAL HIGH (ref 210–475)

## 2019-12-20 LAB — TRIGLYCERIDES: Triglycerides: 119 mg/dL (ref <150)

## 2019-12-20 LAB — LACTATE DEHYDROGENASE: LDH: 142 U/L (ref 98–192)

## 2019-12-20 LAB — D-DIMER, QUANTITATIVE: D-Dimer, Quant: 1.06 ug/mL-FEU — ABNORMAL HIGH (ref 0.00–0.50)

## 2019-12-20 LAB — LACTIC ACID, PLASMA: Lactic Acid, Venous: 1.4 mmol/L (ref 0.5–1.9)

## 2019-12-20 LAB — POC URINE PREG, ED: Preg Test, Ur: NEGATIVE

## 2019-12-20 LAB — PROCALCITONIN: Procalcitonin: <0.1 ng/mL

## 2019-12-20 MED ORDER — SODIUM CHLORIDE 0.9 % IV SOLN: INTRAVENOUS | Status: DC

## 2019-12-20 MED ORDER — PREDNISONE 10 MG PO TABS: 20.0000 mg | ORAL_TABLET | Freq: Every day | ORAL | 0 refills | Status: DC

## 2019-12-20 MED ORDER — FENTANYL CITRATE (PF) 100 MCG/2ML IJ SOLN
50.0000 ug | Freq: Once | INTRAMUSCULAR | Status: AC
  Administered 2019-12-20: 50 ug via INTRAVENOUS
  Filled 2019-12-20: qty 2

## 2019-12-20 MED ORDER — DOXYCYCLINE HYCLATE 100 MG PO CAPS: 100.0000 mg | ORAL_CAPSULE | Freq: Two times a day (BID) | ORAL | 0 refills | Status: DC

## 2019-12-20 MED ORDER — ACETAMINOPHEN 325 MG PO TABS
650.0000 mg | ORAL_TABLET | Freq: Once | ORAL | Status: AC
  Administered 2019-12-20: 650 mg via ORAL
  Filled 2019-12-20: qty 2

## 2019-12-20 MED ORDER — SODIUM CHLORIDE 0.9 % IV SOLN
1.0000 g | Freq: Once | INTRAVENOUS | Status: AC
  Administered 2019-12-20: 1 g via INTRAVENOUS
  Filled 2019-12-20: qty 10

## 2019-12-20 MED ORDER — IOHEXOL 300 MG/ML  SOLN: 100.0000 mL | Freq: Once | INTRAMUSCULAR | Status: DC | PRN

## 2019-12-20 MED ORDER — IOHEXOL 350 MG/ML SOLN
75.0000 mL | Freq: Once | INTRAVENOUS | Status: AC | PRN
  Administered 2019-12-20: 75 mL via INTRAVENOUS

## 2019-12-20 MED ORDER — METHYLPREDNISOLONE SODIUM SUCC 125 MG IJ SOLR
125.0000 mg | Freq: Once | INTRAMUSCULAR | Status: AC
  Administered 2019-12-20: 125 mg via INTRAVENOUS
  Filled 2019-12-20: qty 2

## 2019-12-20 MED ORDER — ALBUTEROL (5 MG/ML) CONTINUOUS INHALATION SOLN: 10.0000 mg | INHALATION_SOLUTION | RESPIRATORY_TRACT | Status: AC

## 2019-12-20 MED ORDER — AZITHROMYCIN 250 MG PO TABS
500.0000 mg | ORAL_TABLET | Freq: Once | ORAL | Status: AC
  Administered 2019-12-20: 500 mg via ORAL
  Filled 2019-12-20: qty 2

## 2019-12-20 NOTE — ED Notes (Signed)
Patient insisted on leaving AMA before speaking to physician or waiting for discharge papers.  Patient instructed to follow up with PCP.  Ambulatory out of ED.

## 2019-12-20 NOTE — ED Triage Notes (Signed)
Pt to er, pt states that she has some back pain radiating around, states that she thinks that she has pneumonia, states  "I can taste the infection and smell it"

## 2019-12-20 NOTE — ED Notes (Signed)
Patient resting in bed.  States "do you know how much longer until the doctor comes in?"

## 2019-12-20 NOTE — Discharge Instructions (Signed)
Your oxygen is still a little bit low, you do have a pneumonia that was seen on the CAT scan that was not seen on the x-ray.  Please take doxycycline twice a day for 10 days.  If you get worse you may return to the emergency department immediately, if you have any other clinical concerns please return.  Otherwise see your doctor in 3 days for recheck

## 2019-12-20 NOTE — ED Provider Notes (Signed)
[facility] EMERGENCY DEPARTMENT Provider Note   CSN: [id number] Arrival date & time: 12/20/19  8676     History Chief Complaint  Patient presents with  . Shortness of Breath    Pamela Livingston is a 54 y.o. female.  HPI   This patient is a 54 year old female, she has a history of COPD from chronic tobacco use, she still smokes half a pack of cigarettes per day.  She has been diagnosed with hypertension but states she is run out of her medications over a month ago and has not had them refilled.  Approximately 48 hours ago she developed increasing shortness of breath, increasing coughing and phlegm and now has a pain under her right shoulder which radiates around to the right front of the chest.  Temperature of 101 at home yesterday and now presents here tachycardic short of breath wheezing and speaking in short sentences.  She denies any significant new swelling of the legs, no dysuria or diarrhea, no loss of taste or smell, she is fully vaccinated against COVID-19 and has never had the infection.  Symptoms are persistent gradually worsening and have now become severe prompting her visit.  Past Medical History:  Diagnosis Date  . Asthma   . Chronic pelvic pain in female   . COPD (chronic obstructive pulmonary disease) ([place])   . Endometriosis   . Polysubstance abuse (HCC)    marijuana, cocaine, benzos  . Stomach ulcer   . Suicide attempt ([place]) 2008   by phenergan overdose  . Syncope     Patient Active Problem List   Diagnosis Date Noted  . Esophageal dysphagia 09/30/2019  . RUQ pain 05/27/2019  . GERD (gastroesophageal reflux disease) 05/27/2019  . Constipation 05/27/2019  . Rectal bleeding 05/27/2019  . Cigarette nicotine dependence with nicotine-induced disorder 04/15/2015  . Chronic obstructive pulmonary disease ([place]) 04/15/2015  . Hyperlipidemia 04/15/2015  . Abnormal mammogram 04/15/2015  . Hematuria 03/07/2015  . Abdominal pain, epigastric 03/07/2015  . Cigarette  nicotine dependence, uncomplicated 72/09/4707  . History of substance abuse ([place]) 03/07/2015  . Esophageal reflux 03/07/2015    Past Surgical History:  Procedure Laterality Date  . ABDOMINAL HYSTERECTOMY    . CERVICAL CONE BIOPSY     cervical cancer  . CESAREAN SECTION  1993  . CHOLECYSTECTOMY    . RADICAL HYSTERECTOMY WITH TRANSPOSITION OF OVARIES    . TUBAL LIGATION  1993     OB History   No obstetric history on file.     Family History  Problem Relation Age of Onset  . Heart disease Mother   . Hypertension Mother   . Diabetes Mother   . Hypertension Father   . Heart attack Maternal Uncle   . COPD Paternal Grandfather   . Heart attack Maternal Uncle   . Colon cancer Neg Hx     Social History   Tobacco Use  . Smoking status: Current Every Day Smoker    Packs/day: 0.50    Years: 27.00    Pack years: 13.50    Types: Cigarettes  . Smokeless tobacco: Never Used  Substance Use Topics  . Alcohol use: Not Currently  . Drug use: Yes    Types: Marijuana    Home Medications Prior to Admission medications   Medication Sig Start Date End Date Taking? Authorizing Provider  albuterol (PROVENTIL) (2.5 MG/3ML) 0.083% nebulizer solution INHALE 1 VIAL VIA NEBULIZER EVERY 6 HOURS AS NEEDED FOR WHEEZING OR SHORTNESS OF BREATH Patient taking differently: Take 2.5 mg  by nebulization every 6 (six) hours as needed for wheezing or shortness of breath.  10/28/19  Yes Soyla Dryer, PA-C  atorvastatin (LIPITOR) 20 MG tablet TAKE 1 Tablet BY MOUTH ONCE EVERY DAY Patient taking differently: Take 20 mg by mouth daily.  10/28/19  Yes Soyla Dryer, PA-C  diphenhydrAMINE (BENADRYL) 25 MG tablet Take 75 mg by mouth at bedtime.    Yes [provider]  Fluticasone-Salmeterol (ADVAIR DISKUS) 100-50 MCG/DOSE AEPB Inhale 1 puff into the lungs in the morning and at bedtime. 07/30/19  Yes Soyla Dryer, PA-C  linaclotide (LINZESS) 290 MCG CAPS capsule Take 1 capsule (290 mcg total) by  mouth daily before breakfast. Patient taking differently: Take 290 mcg by mouth daily as needed (constipation).  05/27/19  Yes Mahala Menghini, PA-C  lisinopril (ZESTRIL) 20 MG tablet Take 1 tablet (20 mg total) by mouth daily. 11/03/18 12/08/20 Yes Josue Hector, MD  loratadine-pseudoephedrine (CLARITIN-D 24-HOUR) 10-240 MG 24 hr tablet Take 1 tablet by mouth in the morning.    Yes [provider]  montelukast (SINGULAIR) 10 MG tablet TAKE 1 Tablet BY MOUTH ONCE DAILY AS NEEDED Patient taking differently: Take 10 mg by mouth at bedtime.  07/30/19  Yes Soyla Dryer, PA-C  nitroGLYCERIN (NITROSTAT) 0.4 MG SL tablet Place 1 tablet (0.4 mg total) under the tongue every 5 (five) minutes as needed for chest pain. 10/02/18  Yes Soyla Dryer, PA-C  omeprazole (PRILOSEC) 40 MG capsule TAKE 1 Capsule  BY MOUTH TWICE DAILY Patient taking differently: Take 40 mg by mouth in the morning and at bedtime.  10/28/19  Yes McElroy, Larene Beach, PA-C  PROVENTIL HFA 108 (90 Base) MCG/ACT inhaler INHALE 2 PUFFS BY MOUTH EVERY 6 HOURS AS NEEDED FOR COUGHING, WHEEZING, OR SHORTNESS OF BREATH Patient taking differently: Inhale 2 puffs into the lungs every 6 (six) hours as needed for wheezing or shortness of breath.  10/28/19  Yes Soyla Dryer, PA-C  acetaminophen (TYLENOL) 500 MG tablet Take 500 mg by mouth every 6 (six) hours as needed. Patient not taking: Reported on 11/25/2019    [provider]  cyclobenzaprine (FLEXERIL) 10 MG tablet Take 1 tablet (10 mg total) by mouth 3 (three) times daily as needed for muscle spasms. Patient not taking: Reported on 11/25/2019 05/19/19   Soyla Dryer, PA-C  doxycycline (VIBRAMYCIN) 100 MG capsule Take 1 capsule (100 mg total) by mouth 2 (two) times daily. 12/20/19   Noemi Chapel, MD  nystatin (MYCOSTATIN) 100000 UNIT/ML suspension Take 5 mLs (500,000 Units total) by mouth 4 (four) times daily. - swish & swallow Patient not taking: Reported on 11/05/2019 05/26/19    Soyla Dryer, PA-C  predniSONE (DELTASONE) 10 MG tablet Take 2 tablets (20 mg total) by mouth daily. 12/20/19   Noemi Chapel, MD    Allergies    Diona Fanti [aspirin]  Review of Systems   Review of Systems  Constitutional: Positive for chills and fever.  HENT: Positive for sinus pain. Negative for sore throat.   Eyes: Negative for visual disturbance.  Respiratory: Positive for cough and shortness of breath.   Cardiovascular: Positive for chest pain. Negative for palpitations and leg swelling.  Gastrointestinal: Negative for abdominal pain, diarrhea, nausea and vomiting.  Endocrine: Negative for polyuria.  Genitourinary: Negative for difficulty urinating, dysuria and frequency.  Musculoskeletal: Negative for back pain and neck pain.  Skin: Negative for rash.  Neurological: Positive for headaches. Negative for weakness and numbness.  Hematological: Negative for adenopathy.  Psychiatric/Behavioral: Negative for behavioral problems.  Physical Exam Updated Vital Signs BP 119/65   Pulse 78   Temp 99.8 F (37.7 C) (Oral)   Resp (!) 24   Ht 1.499 m (4\' 11" )   Wt 80.7 kg   SpO2 93%   BMI 35.95 kg/m   Physical Exam Vitals and nursing note reviewed.  Constitutional:      General: She is in acute distress.     Appearance: She is well-developed.  HENT:     Head: Normocephalic and atraumatic.     Mouth/Throat:     Pharynx: No oropharyngeal exudate.  Eyes:     General: No scleral icterus.       Right eye: No discharge.        Left eye: No discharge.     Conjunctiva/sclera: Conjunctivae normal.     Pupils: Pupils are equal, round, and reactive to light.  Neck:     Thyroid: No thyromegaly.     Vascular: No JVD.  Cardiovascular:     Rate and Rhythm: Regular rhythm. Tachycardia present.     Heart sounds: Normal heart sounds. No murmur heard.  No friction rub. No gallop.   Pulmonary:     Effort: Respiratory distress present.     Breath sounds: Wheezing and rhonchi present.  No rales.     Comments: Diffuse rhonchi in all 4 lung fields anterior and posterior, diffuse wheezing on expiration, speaks in shortened sentences, tachypneic Abdominal:     General: Bowel sounds are normal. There is no distension.     Palpations: Abdomen is soft. There is no mass.     Tenderness: There is no abdominal tenderness.  Musculoskeletal:        General: No tenderness. Normal range of motion.     Cervical back: Normal range of motion and neck supple.  Lymphadenopathy:     Cervical: No cervical adenopathy.  Skin:    General: Skin is warm and dry.     Findings: No erythema or rash.  Neurological:     Mental Status: She is alert.     Coordination: Coordination normal.  Psychiatric:        Behavior: Behavior normal.     ED Results / Procedures / Treatments   Labs (all labs ordered are listed, but only abnormal results are displayed) Labs Reviewed  CBC WITH DIFFERENTIAL/PLATELET - Abnormal; Notable for the following components:      Result Value   WBC 15.7 (*)    Neutro Abs 13.0 (*)    Monocytes Absolute 1.2 (*)    All other components within normal limits  COMPREHENSIVE METABOLIC PANEL - Abnormal; Notable for the following components:   CO2 21 (*)    Glucose, Bld 120 (*)    All other components within normal limits  D-DIMER, QUANTITATIVE (NOT AT [facility]) - Abnormal; Notable for the following components:   D-Dimer, Quant 1.06 (*)    All other components within normal limits  FIBRINOGEN - Abnormal; Notable for the following components:   Fibrinogen 732 (*)    All other components within normal limits  C-REACTIVE PROTEIN - Abnormal; Notable for the following components:   CRP 7.2 (*)    All other components within normal limits  RESP PANEL BY RT-PCR (FLU A&B, COVID) ARPGX2  CULTURE, BLOOD (ROUTINE X 2)  CULTURE, BLOOD (ROUTINE X 2)  LACTIC ACID, PLASMA  PROCALCITONIN  LACTATE DEHYDROGENASE  FERRITIN  TRIGLYCERIDES  LACTIC ACID, PLASMA  POC URINE PREG, ED     EKG EKG Interpretation  Date/Time:  Sunday December 20 2019 17:29:40 EST Ventricular Rate:  115 PR Interval:    QRS Duration: 86 QT Interval:  308 QTC Calculation: 426 R Axis:   39 Text Interpretation: Age not entered, assumed to be  54 years old for purpose of ECG interpretation Sinus tachycardia Borderline T wave abnormalities Since last tracing rate faster Confirmed by Noemi Chapel [phone]) on 12/20/2019 6:14:41 PM   Radiology DG Chest 2 View  Result Date: 12/20/2019 CLINICAL DATA:  Shortness of breath. EXAM: CHEST - 2 VIEW COMPARISON:  None. FINDINGS: Mildly increased bronchovascular lung markings are seen within the bilateral lung bases. There is no evidence of acute infiltrate, pleural effusion or pneumothorax. The heart size and mediastinal contours are within normal limits. Radiopaque surgical clips are seen overlying the right upper quadrant. The visualized skeletal structures are unremarkable. IMPRESSION: Mildly increased bibasilar bronchovascular lung markings without active cardiopulmonary disease. Electronically Signed   By: Virgina Norfolk M.D.   On: 12/20/2019 17:12   CT Angio Chest PE W and/or Wo Contrast  Result Date: 12/20/2019 CLINICAL DATA:  Shortness of breath and back pain. EXAM: CT ANGIOGRAPHY CHEST WITH CONTRAST TECHNIQUE: Multidetector CT imaging of the chest was performed using the standard protocol during bolus administration of intravenous contrast. Multiplanar CT image reconstructions and MIPs were obtained to evaluate the vascular anatomy. CONTRAST:  51mL OMNIPAQUE IOHEXOL 350 MG/ML SOLN COMPARISON:  September 04, 2017 FINDINGS: Cardiovascular: Satisfactory opacification of the pulmonary arteries to the segmental level. No evidence of pulmonary embolism. Normal heart size. No pericardial effusion. Mediastinum/Nodes: No enlarged mediastinal, hilar, or axillary lymph nodes. Thyroid gland, trachea, and esophagus demonstrate no significant findings. Lungs/Pleura:  There is a stable 2 mm calcified lung nodule seen within the right upper lobe. Mild to moderate severity atelectasis and/or infiltrate is seen within the posterior aspect of the left lung base. There is no evidence of a pleural effusion or pneumothorax. Upper Abdomen: Diffuse fatty infiltration of the liver parenchyma is noted. A 7 mm mildly hyperdense focus is seen in the posterior aspect of the right lobe of the liver. Metallic density surgical clips are seen within the gallbladder fossa. Musculoskeletal: No chest wall abnormality. No acute or significant osseous findings. Review of the MIP images confirms the above findings. IMPRESSION: 1. No CT evidence of pulmonary embolism. 2. Mild to moderate severity left basilar atelectasis and/or infiltrate. 3. Diffuse fatty infiltration of the liver parenchyma. 4. Findings suggestive of a small hemangioma within the posterior aspect of the right lobe of the liver. MRI correlation is recommended. 5. Evidence of prior cholecystectomy. Electronically Signed   By: Virgina Norfolk M.D.   On: 12/20/2019 21:34    Procedures Procedures (including critical care time)  Medications Ordered in ED Medications  0.9 %  sodium chloride infusion ( Intravenous New Bag/Given 12/20/19 1803)  albuterol (PROVENTIL,VENTOLIN) solution continuous neb (has no administration in time range)  iohexol (OMNIPAQUE) 300 MG/ML solution 100 mL (has no administration in time range)  acetaminophen (TYLENOL) tablet 650 mg (650 mg Oral Given 12/20/19 1800)  cefTRIAXone (ROCEPHIN) 1 g in sodium chloride 0.9 % 100 mL IVPB (0 g Intravenous Stopped 12/20/19 1842)  azithromycin (ZITHROMAX) tablet 500 mg (500 mg Oral Given 12/20/19 1801)  methylPREDNISolone sodium succinate (SOLU-MEDROL) 125 mg/2 mL injection 125 mg (125 mg Intravenous Given 12/20/19 1801)  fentaNYL (SUBLIMAZE) injection 50 mcg (50 mcg Intravenous Given 12/20/19 2025)  iohexol (OMNIPAQUE) 350 MG/ML injection 75 mL (75 mLs  Intravenous Contrast Given 12/20/19 2058)    ED  Course  I have reviewed the triage vital signs and the nursing notes.  Pertinent labs & imaging results that were available during my care of the patient were reviewed by me and considered in my medical decision making (see chart for details).    MDM Rules/Calculators/A&P                          I have personally viewed the patient's x-ray, there is no signs of acute infiltrates to suggest an obvious pneumonia.  She will need to be checked for Covid but may also need to be checked with a D-dimer for pulmonary embolism.  She most certainly has a COPD exacerbation which could explain the fever tachycardia and wheezing.  She will be given albuterol nebs, Tylenol, some IV fluids.  The patient is agreeable to the plan.  Due to the abnormal tachypnea, the borderline hypoxia and the borderline fever and a normal x-ray I decided to do a CT scan of the chest to make sure there is no signs of pulmonary embolism.  Thankfully there was not however there did appear to be some atelectasis or infiltrate at the left base, this was worse than a prior CT scan, it fit with more of a picture of pneumonia thus I prescribed the patient antibiotics.  She had already received antibiotics in the ER, when I was in another patient's room the patient decided that she wanted to leave, she would not wait for me to come out, I tried to type her papers up and give it to her before she left but she went down the hallway leaving [place] without receiving these papers.      Final Clinical Impression(s) / ED Diagnoses Final diagnoses:  COPD exacerbation ([place])  Community acquired pneumonia of left lower lobe of lung    Rx / DC Orders ED Discharge Orders         Ordered    doxycycline (VIBRAMYCIN) 100 MG capsule  2 times daily        12/20/19 2239    predniSONE (DELTASONE) 10 MG tablet  Daily        12/20/19 2239           Noemi Chapel, MD 12/20/19  2242

## 2019-12-25 LAB — CULTURE, BLOOD (ROUTINE X 2)
Culture: NO GROWTH
Culture: NO GROWTH
Special Requests: ADEQUATE
Special Requests: ADEQUATE

## 2019-12-28 ENCOUNTER — Ambulatory Visit: Payer: Self-pay | Admitting: Physician Assistant

## 2019-12-28 ENCOUNTER — Encounter: Payer: Self-pay | Admitting: Physician Assistant

## 2019-12-28 VITALS — BP 135/94 | HR 74 | Temp 98.6°F

## 2019-12-28 DIAGNOSIS — R109 Unspecified abdominal pain: Secondary | ICD-10-CM

## 2019-12-28 DIAGNOSIS — N3090 Cystitis, unspecified without hematuria: Secondary | ICD-10-CM

## 2019-12-28 LAB — POCT URINALYSIS DIPSTICK
Bilirubin, UA: NEGATIVE
Glucose, UA: NEGATIVE
Ketones, UA: NEGATIVE
Nitrite, UA: NEGATIVE
Protein, UA: NEGATIVE
Spec Grav, UA: <=1.005 — AB (ref 1.010–1.025)
Urobilinogen, UA: 0.2 E.U./dL
pH, UA: 6 (ref 5.0–8.0)

## 2019-12-28 MED ORDER — SULFAMETHOXAZOLE-TRIMETHOPRIM 800-160 MG PO TABS: 1.0000 | ORAL_TABLET | Freq: Two times a day (BID) | ORAL | 0 refills | Status: DC

## 2019-12-28 NOTE — Progress Notes (Signed)
BP (!) 135/94   Pulse 74   Temp 98.6 F (37 C)   SpO2 96%    Subjective:    Patient ID: Pamela Livingston, female    DOB: 09/20/1965, 54 y.o.   MRN: [id number]  HPI: Pamela Livingston is a 54 y.o. female presenting on 12/28/2019 for No chief complaint on file.   HPI     Pt had a negative covid 19 screening questionnaire but then it is noted that she has appointment for covid test this week due to being sick.  She was seen in ER last week and says she was told to get another test this week.     Pt complains of urinary symptoms.  She says Urinary symptoms started over the weekend with flank pain and dysuria.   She is no longer having menses  She says she Cut back smoking to 4-5 /day     Relevant past medical, surgical, family and social history reviewed and updated as indicated. Interim medical history since our last visit reviewed. Allergies and medications reviewed and updated.   Current Outpatient Medications:  .  acetaminophen (TYLENOL) 500 MG tablet, Take 500 mg by mouth every 6 (six) hours as needed. (Patient not taking: Reported on 11/25/2019), Disp: , Rfl:  .  albuterol (PROVENTIL) (2.5 MG/3ML) 0.083% nebulizer solution, INHALE 1 VIAL VIA NEBULIZER EVERY 6 HOURS AS NEEDED FOR WHEEZING OR SHORTNESS OF BREATH (Patient taking differently: Take 2.5 mg by nebulization every 6 (six) hours as needed for wheezing or shortness of breath. ), Disp: 225 mL, Rfl: 0 .  atorvastatin (LIPITOR) 20 MG tablet, TAKE 1 Tablet BY MOUTH ONCE EVERY DAY (Patient taking differently: Take 20 mg by mouth daily. ), Disp: 90 tablet, Rfl: 1 .  cyclobenzaprine (FLEXERIL) 10 MG tablet, Take 1 tablet (10 mg total) by mouth 3 (three) times daily as needed for muscle spasms. (Patient not taking: Reported on 11/25/2019), Disp: 30 tablet, Rfl: 0 .  diphenhydrAMINE (BENADRYL) 25 MG tablet, Take 75 mg by mouth at bedtime. , Disp: , Rfl:  .  doxycycline (VIBRAMYCIN) 100 MG capsule, Take 1 capsule (100 mg total) by mouth 2  (two) times daily., Disp: 20 capsule, Rfl: 0 .  Fluticasone-Salmeterol (ADVAIR DISKUS) 100-50 MCG/DOSE AEPB, Inhale 1 puff into the lungs in the morning and at bedtime., Disp: 60 each, Rfl: 1 .  linaclotide (LINZESS) 290 MCG CAPS capsule, Take 1 capsule (290 mcg total) by mouth daily before breakfast. (Patient taking differently: Take 290 mcg by mouth daily as needed (constipation). ), Disp: 15 capsule, Rfl: 0 .  lisinopril (ZESTRIL) 20 MG tablet, Take 1 tablet (20 mg total) by mouth daily., Disp: 90 tablet, Rfl: 3 .  loratadine-pseudoephedrine (CLARITIN-D 24-HOUR) 10-240 MG 24 hr tablet, Take 1 tablet by mouth in the morning. , Disp: , Rfl:  .  montelukast (SINGULAIR) 10 MG tablet, TAKE 1 Tablet BY MOUTH ONCE DAILY AS NEEDED (Patient taking differently: Take 10 mg by mouth at bedtime. ), Disp: 90 tablet, Rfl: 1 .  nitroGLYCERIN (NITROSTAT) 0.4 MG SL tablet, Place 1 tablet (0.4 mg total) under the tongue every 5 (five) minutes as needed for chest pain., Disp: 25 tablet, Rfl: prn .  nystatin (MYCOSTATIN) 100000 UNIT/ML suspension, Take 5 mLs (500,000 Units total) by mouth 4 (four) times daily. - swish & swallow (Patient not taking: Reported on 11/05/2019), Disp: 60 mL, Rfl: 0 .  omeprazole (PRILOSEC) 40 MG capsule, TAKE 1 Capsule  BY MOUTH TWICE DAILY (Patient taking  differently: Take 40 mg by mouth in the morning and at bedtime. ), Disp: 180 capsule, Rfl: 1 .  predniSONE (DELTASONE) 10 MG tablet, Take 2 tablets (20 mg total) by mouth daily., Disp: 15 tablet, Rfl: 0 .  PROVENTIL HFA 108 (90 Base) MCG/ACT inhaler, INHALE 2 PUFFS BY MOUTH EVERY 6 HOURS AS NEEDED FOR COUGHING, WHEEZING, OR SHORTNESS OF BREATH (Patient taking differently: Inhale 2 puffs into the lungs every 6 (six) hours as needed for wheezing or shortness of breath. ), Disp: 20.1 g, Rfl: 1 .  sulfamethoxazole-trimethoprim (BACTRIM DS) 800-160 MG tablet, Take 1 tablet by mouth 2 (two) times daily., Disp: 14 tablet, Rfl: 0    Review of  Systems  Per HPI unless specifically indicated above     Objective:    BP (!) 135/94   Pulse 74   Temp 98.6 F (37 C)   SpO2 96%   Wt Readings from Last 3 Encounters:  12/20/19 178 lb (80.7 kg)  11/27/19 174 lb (78.9 kg)  09/30/19 177 lb 12.8 oz (80.6 kg)    Physical Exam Constitutional:      General: She is not in acute distress.    Appearance: She is not toxic-appearing.  HENT:     Head: Normocephalic and atraumatic.  Pulmonary:     Effort: Pulmonary effort is normal. No respiratory distress.  Neurological:     Mental Status: She is alert and oriented to person, place, and time.  Psychiatric:        Speech: Speech normal.        Behavior: Behavior is cooperative.        Urinalysis    Component Value Date/Time   COLORURINE YELLOW 03/02/2015 0225   APPEARANCEUR TURBID (A) 03/02/2015 0225   LABSPEC 1.007 03/02/2015 0225   PHURINE 5.5 03/02/2015 0225   GLUCOSEU NEGATIVE 03/02/2015 0225   HGBUR MODERATE (A) 03/02/2015 0225   BILIRUBINUR negative 12/28/2019 1054   KETONESUR NEGATIVE 03/02/2015 0225   PROTEINUR Negative 12/28/2019 1054   PROTEINUR NEGATIVE 03/02/2015 0225   UROBILINOGEN 0.2 12/28/2019 1054   UROBILINOGEN 0.2 06/03/2014 1000   NITRITE negative 12/28/2019 1054   NITRITE NEGATIVE 03/02/2015 0225   LEUKOCYTESUR Small (1+) (A) 12/28/2019 1054         Assessment & Plan:    Encounter Diagnoses  Name Primary?  . Cystitis Yes  . Flank pain       rx septra Pt encouraged to drink plenty water

## 2019-12-31 ENCOUNTER — Other Ambulatory Visit: Payer: Self-pay

## 2020-01-28 ENCOUNTER — Other Ambulatory Visit: Payer: Self-pay | Admitting: Physician Assistant

## 2020-01-28 DIAGNOSIS — Z131 Encounter for screening for diabetes mellitus: Secondary | ICD-10-CM

## 2020-01-28 DIAGNOSIS — E785 Hyperlipidemia, unspecified: Secondary | ICD-10-CM

## 2020-01-28 DIAGNOSIS — I10 Essential (primary) hypertension: Secondary | ICD-10-CM

## 2020-01-28 DIAGNOSIS — R7309 Other abnormal glucose: Secondary | ICD-10-CM

## 2020-02-15 ENCOUNTER — Ambulatory Visit: Payer: Self-pay | Admitting: Physician Assistant

## 2020-02-15 ENCOUNTER — Encounter: Payer: Self-pay | Admitting: Physician Assistant

## 2020-02-15 DIAGNOSIS — J449 Chronic obstructive pulmonary disease, unspecified: Secondary | ICD-10-CM

## 2020-02-15 DIAGNOSIS — E785 Hyperlipidemia, unspecified: Secondary | ICD-10-CM

## 2020-02-15 DIAGNOSIS — F17200 Nicotine dependence, unspecified, uncomplicated: Secondary | ICD-10-CM

## 2020-02-15 DIAGNOSIS — Z20822 Contact with and (suspected) exposure to covid-19: Secondary | ICD-10-CM

## 2020-02-15 DIAGNOSIS — I10 Essential (primary) hypertension: Secondary | ICD-10-CM

## 2020-02-15 MED ORDER — LISINOPRIL 20 MG PO TABS
20.0000 mg | ORAL_TABLET | Freq: Every day | ORAL | 1 refills | Status: DC
Start: 2020-02-15 — End: 2020-04-19

## 2020-02-15 NOTE — Progress Notes (Signed)
There were no vitals taken for this visit.   Subjective:    Patient ID: Pamela Livingston, female    DOB: Feb 15, 1965, 55 y.o.   MRN: [id number]  HPI: Pamela Livingston is a 55 y.o. female presenting on 02/15/2020 for No chief complaint on file.   HPI  This is a telemedicine appointment due to coronavirus pandemic.  It is through Telephone as pt doesn't have ability to connect with video.  I connected with  ELEXIUS MINAR on 02/15/20 by a video enabled telemedicine application and verified that I am speaking with the correct person using two identifiers.   I discussed the limitations of evaluation and management by telemedicine. The patient expressed understanding and agreed to proceed.  Pt is at home.  Provider is at office.     Pt is Checking her bp at home.  120/86 at last check a few days ago.  She Didn't get labs done.  She says she Hasn't been out since it snowed.  She is worried about the covid.   She has not gotten covid bosster yet  She is Out of lisinopril.  She has requested it from her cardiologist..  She hasn't been there >>  A year.   Stress test in media- normal.    She reports some Sob.  She is using nebs tid.  She is also reporting Lots of congestion and HA.  This has been Going about a week.    She is still smoking      Relevant past medical, surgical, family and social history reviewed and updated as indicated. Interim medical history since our last visit reviewed. Allergies and medications reviewed and updated.   Current Outpatient Medications:  .  acetaminophen (TYLENOL) 500 MG tablet, Take 500 mg by mouth every 6 (six) hours as needed., Disp: , Rfl:  .  albuterol (PROVENTIL) (2.5 MG/3ML) 0.083% nebulizer solution, INHALE 1 VIAL VIA NEBULIZER EVERY 6 HOURS AS NEEDED FOR WHEEZING OR SHORTNESS OF BREATH (Patient taking differently: Take 2.5 mg by nebulization every 6 (six) hours as needed for wheezing or shortness of breath.), Disp: 225 mL, Rfl: 0 .  atorvastatin  (LIPITOR) 20 MG tablet, TAKE 1 Tablet BY MOUTH ONCE EVERY DAY (Patient taking differently: Take 20 mg by mouth daily.), Disp: 90 tablet, Rfl: 1 .  cyclobenzaprine (FLEXERIL) 10 MG tablet, Take 1 tablet (10 mg total) by mouth 3 (three) times daily as needed for muscle spasms., Disp: 30 tablet, Rfl: 0 .  diphenhydrAMINE (BENADRYL) 25 MG tablet, Take 75 mg by mouth at bedtime. , Disp: , Rfl:  .  Fluticasone-Salmeterol (ADVAIR DISKUS) 100-50 MCG/DOSE AEPB, Inhale 1 puff into the lungs in the morning and at bedtime., Disp: 60 each, Rfl: 1 .  linaclotide (LINZESS) 290 MCG CAPS capsule, Take 1 capsule (290 mcg total) by mouth daily before breakfast. (Patient taking differently: Take 290 mcg by mouth daily as needed (constipation).), Disp: 15 capsule, Rfl: 0 .  loratadine-pseudoephedrine (CLARITIN-D 24-HOUR) 10-240 MG 24 hr tablet, Take 1 tablet by mouth in the morning. , Disp: , Rfl:  .  montelukast (SINGULAIR) 10 MG tablet, TAKE 1 Tablet BY MOUTH ONCE DAILY AS NEEDED (Patient taking differently: Take 10 mg by mouth at bedtime.), Disp: 90 tablet, Rfl: 1 .  omeprazole (PRILOSEC) 40 MG capsule, TAKE 1 Capsule  BY MOUTH TWICE DAILY (Patient taking differently: Take 40 mg by mouth in the morning and at bedtime.), Disp: 180 capsule, Rfl: 1 .  PROVENTIL HFA 108 (90  Base) MCG/ACT inhaler, INHALE 2 PUFFS BY MOUTH EVERY 6 HOURS AS NEEDED FOR COUGHING, WHEEZING, OR SHORTNESS OF BREATH (Patient taking differently: Inhale 2 puffs into the lungs every 6 (six) hours as needed for wheezing or shortness of breath.), Disp: 20.1 g, Rfl: 1 .  lisinopril (ZESTRIL) 20 MG tablet, Take 1 tablet (20 mg total) by mouth daily. (Patient not taking: Reported on 02/15/2020), Disp: 90 tablet, Rfl: 3 .  nitroGLYCERIN (NITROSTAT) 0.4 MG SL tablet, Place 1 tablet (0.4 mg total) under the tongue every 5 (five) minutes as needed for chest pain. (Patient not taking: Reported on 02/15/2020), Disp: 25 tablet, Rfl: prn     Review of Systems  Per  HPI unless specifically indicated above     Objective:    There were no vitals taken for this visit.  Wt Readings from Last 3 Encounters:  12/20/19 178 lb (80.7 kg)  11/27/19 174 lb (78.9 kg)  09/30/19 177 lb 12.8 oz (80.6 kg)    Physical Exam Constitutional:      General: She is not in acute distress. Pulmonary:     Effort: No respiratory distress.     Comments: Pt is speaking in complete sentences without stopping due to dyspnea Neurological:     Mental Status: She is alert and oriented to person, place, and time.  Psychiatric:        Attention and Perception: Attention normal.        Speech: Speech normal.        Behavior: Behavior is cooperative.            Assessment & Plan:    Encounter Diagnoses  Name Primary?  . Person under investigation for COVID-19 Yes  . Essential hypertension   . Hyperlipidemia, unspecified hyperlipidemia type   . Tobacco use disorder   . Chronic obstructive pulmonary disease, unspecified COPD type ([place])       -Recommended pt get covid test.  She does not want to be scheduled through  and says she would be prefer to go to [facility] where appointments aren't required.  Asked pt that she Notify office of result -rx for lisinopril [facility] to [facility] -encouraged pt to get covid booster after her current illness is resolved -pt to follow up in  3 months.  She is to contact office sooner prn

## 2020-04-19 ENCOUNTER — Other Ambulatory Visit: Payer: Self-pay | Admitting: Physician Assistant

## 2020-04-19 MED ORDER — ALBUTEROL SULFATE (2.5 MG/3ML) 0.083% IN NEBU
INHALATION_SOLUTION | RESPIRATORY_TRACT | 1 refills | Status: DC
Start: 2020-04-19 — End: 2020-05-04

## 2020-04-19 MED ORDER — LISINOPRIL 20 MG PO TABS
20.0000 mg | ORAL_TABLET | Freq: Every day | ORAL | 1 refills | Status: DC
Start: 2020-04-19 — End: 2020-10-31

## 2020-04-23 ENCOUNTER — Emergency Department (HOSPITAL_COMMUNITY)
Admission: EM | Admit: 2020-04-23 | Discharge: 2020-04-23 | Disposition: A | Discharge status: Home / Self Care | Payer: Self-pay | Attending: Emergency Medicine | Admitting: Emergency Medicine

## 2020-04-23 ENCOUNTER — Emergency Department (HOSPITAL_COMMUNITY): Payer: Self-pay

## 2020-04-23 ENCOUNTER — Encounter (HOSPITAL_COMMUNITY): Payer: Self-pay | Admitting: Emergency Medicine

## 2020-04-23 ENCOUNTER — Other Ambulatory Visit: Payer: Self-pay

## 2020-04-23 DIAGNOSIS — J45909 Unspecified asthma, uncomplicated: Secondary | ICD-10-CM | POA: Insufficient documentation

## 2020-04-23 DIAGNOSIS — F17210 Nicotine dependence, cigarettes, uncomplicated: Secondary | ICD-10-CM | POA: Insufficient documentation

## 2020-04-23 DIAGNOSIS — M79662 Pain in left lower leg: Secondary | ICD-10-CM | POA: Insufficient documentation

## 2020-04-23 DIAGNOSIS — J449 Chronic obstructive pulmonary disease, unspecified: Secondary | ICD-10-CM | POA: Insufficient documentation

## 2020-04-23 DIAGNOSIS — Z7952 Long term (current) use of systemic steroids: Secondary | ICD-10-CM | POA: Insufficient documentation

## 2020-04-23 MED ORDER — ENOXAPARIN SODIUM 80 MG/0.8ML ~~LOC~~ SOLN
80.0000 mg | Freq: Once | SUBCUTANEOUS | Status: AC
  Administered 2020-04-23: 80 mg via SUBCUTANEOUS
  Filled 2020-04-23: qty 0.8

## 2020-04-23 NOTE — ED Triage Notes (Signed)
Patient c/o left leg pain x3 days without any known injury. Patient also reports discoloration to leg. Capillary refill WNL. Foot warm. Will check pulses with doppler.

## 2020-04-23 NOTE — Discharge Instructions (Addendum)
Please read and follow all provided instructions.  Your diagnoses today include:  1. Pain of left calf     Tests performed today include:  Vital signs. See below for your results today.   Medications prescribed:   None  Take any prescribed medications only as directed.  Additional Information:  Follow any educational materials contained in this packet.  Although you appear stable for discharge, you may still have a blood clot in your leg(s) called a DVT (deep venous thrombosis). You may have received initial treatment with an injection of a blood thinner to treat DVTs, but low risk patients do not always get treated before an ultrasound is performed to diagnose or rule out a DVT, due to the risks of bleeding from the medication used. It is important you follow up for an ultrasound within one day as directed.  Follow-up instructions: Please return to the hospital tomorrow morning for your ultrasound as directed.  Please follow-up with your primary care provider in the next 1-2 days for further evaluation of your symptoms.   Return instructions:   Please return to the Emergency Department if you experience worsening symptoms.  Return immediately if you develop chest pain, shortness of breath, dizziness or fainting.  Return with new color change or weakness/numbness to your affected leg(s).  Return with bleeding, severe headache, confusion or altered mental status.  Please return if you have any other emergent concerns.  Additional Information:  Your vital signs today were: BP (!) 125/94 (BP Location: Right Arm)   Pulse 92   Temp 97.7 F (36.5 C) (Oral)   Resp 18   Ht 4\' 11"  (1.499 m)   Wt 77.1 kg   SpO2 98%   BMI 34.34 kg/m  If your blood pressure (BP) was elevated above 135/85 this visit, please have this repeated by your doctor within one month. --------------

## 2020-04-23 NOTE — ED Notes (Signed)
DC instructions reviewed with pt. She is aware to return tomorrow at 0900 for testing, knows to show up 15 minutes early before appointment. Explained s/s to watch for in case she needs to return. No additional questions at discharge.

## 2020-04-23 NOTE — ED Provider Notes (Signed)
[facility] EMERGENCY DEPARTMENT Provider Note   CSN: [id number] Arrival date & time: 04/23/20  1555     History Chief Complaint  Patient presents with  . Leg Pain    Pamela Livingston is a 55 y.o. female.  Patient presents to the emergency department today for evaluation of left lower extremity swelling and discoloration.  Patient states that over the past 3 days she has developed worse than baseline pain in her left calf and behind her knee.  She has noted some dark purplish discoloration of the lateral and proximal calf.  She denies injuries.  No chest pain or shortness of breath.  No history of blood clots.  Patient had a CT scan in November/2021 to evaluate for the possibility of PE, which was negative.  In epic, there are 2 negative ultrasounds in the past.  No treatments prior to arrival.        Past Medical History:  Diagnosis Date  . Asthma   . Chronic pelvic pain in female   . COPD (chronic obstructive pulmonary disease) ([place])   . Endometriosis   . Polysubstance abuse ([facility])    marijuana, cocaine, benzos  . Stomach ulcer   . Suicide attempt ([place]) 2008   by phenergan overdose  . Syncope     Patient Active Problem List   Diagnosis Date Noted  . Esophageal dysphagia 09/30/2019  . RUQ pain 05/27/2019  . GERD (gastroesophageal reflux disease) 05/27/2019  . Constipation 05/27/2019  . Rectal bleeding 05/27/2019  . Cigarette nicotine dependence with nicotine-induced disorder 04/15/2015  . Chronic obstructive pulmonary disease ([place]) 04/15/2015  . Hyperlipidemia 04/15/2015  . Abnormal mammogram 04/15/2015  . Hematuria 03/07/2015  . Abdominal pain, epigastric 03/07/2015  . Cigarette nicotine dependence, uncomplicated 62/69/4854  . History of substance abuse ([place]) 03/07/2015  . Esophageal reflux 03/07/2015    Past Surgical History:  Procedure Laterality Date  . ABDOMINAL HYSTERECTOMY    . CERVICAL CONE BIOPSY     cervical cancer  . CESAREAN SECTION  1993  .  CHOLECYSTECTOMY    . RADICAL HYSTERECTOMY WITH TRANSPOSITION OF OVARIES    . TOOTH EXTRACTION    . TUBAL LIGATION  1993     OB History    Gravida  2   Para  2   Term  2   Preterm      AB      Living  2     SAB      IAB      Ectopic      Multiple      Live Births              Family History  Problem Relation Age of Onset  . Heart disease Mother   . Hypertension Mother   . Diabetes Mother   . Hypertension Father   . Heart attack Maternal Uncle   . COPD Paternal Grandfather   . Heart attack Maternal Uncle   . Colon cancer Neg Hx     Social History   Tobacco Use  . Smoking status: Current Every Day Smoker    Packs/day: 0.50    Years: 27.00    Pack years: 13.50    Types: Cigarettes  . Smokeless tobacco: Never Used  Vaping Use  . Vaping Use: Never used  Substance Use Topics  . Alcohol use: Not Currently  . Drug use: Yes    Types: Marijuana    Home Medications Prior to Admission medications   Medication Sig Start  Date End Date Taking? Authorizing Provider  acetaminophen (TYLENOL) 500 MG tablet Take 500 mg by mouth every 6 (six) hours as needed.    [provider]  albuterol (PROVENTIL) (2.5 MG/3ML) 0.083% nebulizer solution INHALE 1 VIAL VIA NEBULIZER EVERY 6 HOURS AS NEEDED FOR WHEEZING OR SHORTNESS OF BREATH 04/19/20   Soyla Dryer, PA-C  atorvastatin (LIPITOR) 20 MG tablet TAKE 1 Tablet BY MOUTH ONCE EVERY DAY Patient taking differently: Take 20 mg by mouth daily. 10/28/19   Soyla Dryer, PA-C  cyclobenzaprine (FLEXERIL) 10 MG tablet Take 1 tablet (10 mg total) by mouth 3 (three) times daily as needed for muscle spasms. 05/19/19   Soyla Dryer, PA-C  diphenhydrAMINE (BENADRYL) 25 MG tablet Take 75 mg by mouth at bedtime.     [provider]  Fluticasone-Salmeterol (ADVAIR DISKUS) 100-50 MCG/DOSE AEPB Inhale 1 puff into the lungs in the morning and at bedtime. 07/30/19   Soyla Dryer, PA-C  linaclotide (LINZESS) 290 MCG  CAPS capsule Take 1 capsule (290 mcg total) by mouth daily before breakfast. Patient taking differently: Take 290 mcg by mouth daily as needed (constipation). 05/27/19   Mahala Menghini, PA-C  lisinopril (ZESTRIL) 20 MG tablet Take 1 tablet (20 mg total) by mouth daily. 04/19/20   Soyla Dryer, PA-C  loratadine-pseudoephedrine (CLARITIN-D 24-HOUR) 10-240 MG 24 hr tablet Take 1 tablet by mouth in the morning.     [provider]  montelukast (SINGULAIR) 10 MG tablet TAKE 1 Tablet BY MOUTH ONCE DAILY AS NEEDED Patient taking differently: Take 10 mg by mouth at bedtime. 07/30/19   Soyla Dryer, PA-C  nitroGLYCERIN (NITROSTAT) 0.4 MG SL tablet Place 1 tablet (0.4 mg total) under the tongue every 5 (five) minutes as needed for chest pain. Patient not taking: Reported on 02/15/2020 10/02/18   Soyla Dryer, PA-C  omeprazole (PRILOSEC) 40 MG capsule TAKE 1 Capsule  BY MOUTH TWICE DAILY Patient taking differently: Take 40 mg by mouth in the morning and at bedtime. 10/28/19   Soyla Dryer, PA-C  PROVENTIL HFA 108 (90 Base) MCG/ACT inhaler INHALE 2 PUFFS BY MOUTH EVERY 6 HOURS AS NEEDED FOR COUGHING, WHEEZING, OR SHORTNESS OF BREATH Patient taking differently: Inhale 2 puffs into the lungs every 6 (six) hours as needed for wheezing or shortness of breath. 10/28/19   Soyla Dryer, PA-C    Allergies    Asa [aspirin]  Review of Systems   Review of Systems  Constitutional: Negative for activity change.  Respiratory: Negative for shortness of breath.   Cardiovascular: Positive for leg swelling. Negative for chest pain.  Musculoskeletal: Positive for myalgias. Negative for arthralgias, back pain, joint swelling and neck pain.  Skin: Positive for color change. Negative for wound.  Neurological: Negative for weakness and numbness.    Physical Exam Updated Vital Signs BP (!) 125/94 (BP Location: Right Arm)   Pulse 92   Temp 97.7 F (36.5 C) (Oral)   Resp 18   Ht 4\' 11"  (1.499 m)    Wt 77.1 kg   SpO2 98%   BMI 34.34 kg/m   Physical Exam Vitals and nursing note reviewed.  Constitutional:      Appearance: She is well-developed.  HENT:     Head: Normocephalic and atraumatic.  Eyes:     Pupils: Pupils are equal, round, and reactive to light.  Cardiovascular:     Rate and Rhythm: Normal rate.     Pulses: Normal pulses. No decreased pulses.  Pulmonary:     Breath sounds: No  wheezing, rhonchi or rales.  Musculoskeletal:        General: Tenderness present.     Cervical back: Normal range of motion and neck supple.     Comments: Left calf: There is mild swelling and slight discoloration of the proximal and lateral calf.  Patient has tenderness in this area.  Right calf: Normal in appearance without edema or swelling.  No tenderness.  Patient has strong 2+ DP pulses in the feet bilaterally.  Skin:    General: Skin is warm and dry.  Neurological:     Mental Status: She is alert.     Sensory: No sensory deficit.     Comments: Motor, sensation, and vascular distal to the injury is fully intact.      ED Results / Procedures / Treatments   Labs (all labs ordered are listed, but only abnormal results are displayed) Labs Reviewed - No data to display  EKG None  Radiology No results found.  Procedures Procedures   Medications Ordered in ED Medications  enoxaparin (LOVENOX) injection 80 mg (has no administration in time range)    ED Course  I have reviewed the triage vital signs and the nursing notes.  Pertinent labs & imaging results that were available during my care of the patient were reviewed by me and considered in my medical decision making (see chart for details).  Patient seen and examined. Work-up initiated.   Vital signs reviewed and are as follows: BP (!) 125/94 (BP Location: Right Arm)   Pulse 92   Temp 97.7 F (36.5 C) (Oral)   Resp 18   Ht 4\' 11"  (1.499 m)   Wt 77.1 kg   SpO2 98%   BMI 34.34 kg/m   Unable to obtain ultrasound  here today as they are only available 8 AM to noon.  Patient updated.  She will be given a dose of Lovenox prior to discharge.  Encouraged elevation, Tylenol for pain.  Patient will come back at 9 AM tomorrow for her study.    MDM Rules/Calculators/A&P                          Calf tenderness and swelling.  Normal pulses and do not suspect arterial occlusion.  Concern for musculoskeletal injury and pain versus DVT.  Treatment plan as above.  No clinical signs and symptoms of PE.  No tachycardia or hypoxia.  Final Clinical Impression(s) / ED Diagnoses Final diagnoses:  Pain of left calf    Rx / DC Orders ED Discharge Orders         Ordered    US Venous Img Lower Unilateral Left        04/23/20 1656           Carlisle Cater, PA-C 04/23/20 1657    Lajean Saver, MD 04/23/20 [phone]

## 2020-04-24 ENCOUNTER — Ambulatory Visit (HOSPITAL_COMMUNITY)
Admission: RE | Admit: 2020-04-24 | Discharge: 2020-04-24 | Disposition: A | Discharge status: Home / Self Care | Payer: Self-pay | Source: Ambulatory Visit | Attending: Emergency Medicine | Admitting: Emergency Medicine

## 2020-04-24 ENCOUNTER — Encounter (HOSPITAL_COMMUNITY): Payer: Self-pay

## 2020-04-24 ENCOUNTER — Ambulatory Visit (HOSPITAL_COMMUNITY): Admit: 2020-04-24 | Payer: Self-pay

## 2020-04-24 DIAGNOSIS — R609 Edema, unspecified: Secondary | ICD-10-CM | POA: Insufficient documentation

## 2020-04-24 NOTE — ED Provider Notes (Signed)
This patient was informed of her results, no signs of DVT   Pamela Chapel, MD 04/24/20 1023

## 2020-04-28 ENCOUNTER — Ambulatory Visit: Payer: Self-pay | Admitting: Physician Assistant

## 2020-04-28 ENCOUNTER — Encounter: Payer: Self-pay | Admitting: Physician Assistant

## 2020-04-28 ENCOUNTER — Other Ambulatory Visit (HOSPITAL_COMMUNITY)
Admission: RE | Admit: 2020-04-28 | Discharge: 2020-04-28 | Disposition: A | Discharge status: Home / Self Care | Payer: Self-pay | Source: Ambulatory Visit | Attending: Physician Assistant | Admitting: Physician Assistant

## 2020-04-28 ENCOUNTER — Other Ambulatory Visit: Payer: Self-pay

## 2020-04-28 VITALS — BP 122/84 | HR 91 | Temp 96.6°F

## 2020-04-28 DIAGNOSIS — Z131 Encounter for screening for diabetes mellitus: Secondary | ICD-10-CM

## 2020-04-28 DIAGNOSIS — J441 Chronic obstructive pulmonary disease with (acute) exacerbation: Secondary | ICD-10-CM

## 2020-04-28 DIAGNOSIS — R7309 Other abnormal glucose: Secondary | ICD-10-CM

## 2020-04-28 DIAGNOSIS — E785 Hyperlipidemia, unspecified: Secondary | ICD-10-CM | POA: Insufficient documentation

## 2020-04-28 DIAGNOSIS — M79605 Pain in left leg: Secondary | ICD-10-CM

## 2020-04-28 DIAGNOSIS — I10 Essential (primary) hypertension: Secondary | ICD-10-CM | POA: Insufficient documentation

## 2020-04-28 DIAGNOSIS — F17200 Nicotine dependence, unspecified, uncomplicated: Secondary | ICD-10-CM

## 2020-04-28 LAB — CBC WITH DIFFERENTIAL/PLATELET
Abs Immature Granulocytes: 0.01 10*3/uL (ref 0.00–0.07)
Basophils Absolute: 0.1 10*3/uL (ref 0.0–0.1)
Basophils Relative: 1 %
Eosinophils Absolute: 0.4 10*3/uL (ref 0.0–0.5)
Eosinophils Relative: 7 %
HCT: 41.1 % (ref 36.0–46.0)
Hemoglobin: 13.5 g/dL (ref 12.0–15.0)
Immature Granulocytes: 0 %
Lymphocytes Relative: 40 %
Lymphs Abs: 2.6 10*3/uL (ref 0.7–4.0)
MCH: 32.1 pg (ref 26.0–34.0)
MCHC: 32.8 g/dL (ref 30.0–36.0)
MCV: 97.6 fL (ref 80.0–100.0)
Monocytes Absolute: 0.4 10*3/uL (ref 0.1–1.0)
Monocytes Relative: 6 %
Neutro Abs: 3 10*3/uL (ref 1.7–7.7)
Neutrophils Relative %: 46 %
Platelets: 361 10*3/uL (ref 150–400)
RBC: 4.21 MIL/uL (ref 3.87–5.11)
RDW: 13.1 % (ref 11.5–15.5)
WBC: 6.5 10*3/uL (ref 4.0–10.5)
nRBC: 0 % (ref 0.0–0.2)

## 2020-04-28 LAB — COMPREHENSIVE METABOLIC PANEL
ALT: 23 U/L (ref 0–44)
AST: 15 U/L (ref 15–41)
Albumin: 4.1 g/dL (ref 3.5–5.0)
Alkaline Phosphatase: 97 U/L (ref 38–126)
Anion gap: 9 (ref 5–15)
BUN: 14 mg/dL (ref 6–20)
CO2: 24 mmol/L (ref 22–32)
Calcium: 9.4 mg/dL (ref 8.9–10.3)
Chloride: 105 mmol/L (ref 98–111)
Creatinine, Ser: 0.78 mg/dL (ref 0.44–1.00)
GFR, Estimated: >60 mL/min (ref >60)
Glucose, Bld: 106 mg/dL — ABNORMAL HIGH (ref 70–99)
Potassium: 4 mmol/L (ref 3.5–5.1)
Sodium: 138 mmol/L (ref 135–145)
Total Bilirubin: 0.5 mg/dL (ref 0.3–1.2)
Total Protein: 7.2 g/dL (ref 6.5–8.1)

## 2020-04-28 LAB — LIPID PANEL
Cholesterol: 176 mg/dL (ref 0–200)
HDL: 44 mg/dL (ref >40)
LDL Cholesterol: 104 mg/dL — ABNORMAL HIGH (ref 0–99)
Total CHOL/HDL Ratio: 4 RATIO
Triglycerides: 138 mg/dL (ref <150)
VLDL: 28 mg/dL (ref 0–40)

## 2020-04-28 LAB — SEDIMENTATION RATE: Sed Rate: 30 mm/hr — ABNORMAL HIGH (ref 0–22)

## 2020-04-28 LAB — HEMOGLOBIN A1C
Hgb A1c MFr Bld: 5.7 % — ABNORMAL HIGH (ref 4.8–5.6)
Mean Plasma Glucose: 116.89 mg/dL

## 2020-04-28 MED ORDER — ATORVASTATIN CALCIUM 20 MG PO TABS
ORAL_TABLET | ORAL | 1 refills | Status: DC
Start: 2020-04-28 — End: 2020-10-31

## 2020-04-28 MED ORDER — PREDNISONE 10 MG (21) PO TBPK: ORAL_TABLET | ORAL | 0 refills | Status: DC

## 2020-04-28 NOTE — Progress Notes (Signed)
BP 122/84   Pulse 91   Temp (!) 96.6 F (35.9 C)   SpO2 97%    Subjective:    Patient ID: Pamela Livingston, female    DOB: [birth year], 55 y.o.   MRN: [id number]  HPI: Pamela Livingston is a 55 y.o. female presenting on 04/28/2020 for No chief complaint on file.   HPI  Pt had a negative covid 19 screening questionnaire.   Pt is here for LLE pain.    Pt says the Leg has been "red and splotchy" since Wednesday last week.  She went to ER on 04/23/20.  She had another [place] that was negative for DVT.  Pt had xray done September due to complaints of the LLE pain.   She is still smoking but says she has cut back to only 2 or 3 cigarettes daily.    She is excited that she will become a grandmother later this year.     Relevant past medical, surgical, family and social history reviewed and updated as indicated. Interim medical history since our last visit reviewed. Allergies and medications reviewed and updated.   Current Outpatient Medications:  .  acetaminophen (TYLENOL) 500 MG tablet, Take 500 mg by mouth every 6 (six) hours as needed., Disp: , Rfl:  .  albuterol (PROVENTIL) (2.5 MG/3ML) 0.083% nebulizer solution, INHALE 1 VIAL VIA NEBULIZER EVERY 6 HOURS AS NEEDED FOR WHEEZING OR SHORTNESS OF BREATH, Disp: 75 mL, Rfl: 1 .  cyclobenzaprine (FLEXERIL) 10 MG tablet, Take 1 tablet (10 mg total) by mouth 3 (three) times daily as needed for muscle spasms., Disp: 30 tablet, Rfl: 0 .  diphenhydrAMINE (BENADRYL) 25 MG tablet, Take 75 mg by mouth at bedtime. , Disp: , Rfl:  .  Fluticasone-Salmeterol (ADVAIR DISKUS) 100-50 MCG/DOSE AEPB, Inhale 1 puff into the lungs in the morning and at bedtime., Disp: 60 each, Rfl: 1 .  linaclotide (LINZESS) 290 MCG CAPS capsule, Take 1 capsule (290 mcg total) by mouth daily before breakfast. (Patient taking differently: Take 290 mcg by mouth daily as needed (constipation).), Disp: 15 capsule, Rfl: 0 .  lisinopril (ZESTRIL) 20 MG tablet, Take 1 tablet (20 mg total) by  mouth daily., Disp: 30 tablet, Rfl: 1 .  loratadine-pseudoephedrine (CLARITIN-D 24-HOUR) 10-240 MG 24 hr tablet, Take 1 tablet by mouth in the morning. , Disp: , Rfl:  .  PROVENTIL HFA 108 (90 Base) MCG/ACT inhaler, INHALE 2 PUFFS BY MOUTH EVERY 6 HOURS AS NEEDED FOR COUGHING, WHEEZING, OR SHORTNESS OF BREATH (Patient taking differently: Inhale 2 puffs into the lungs every 6 (six) hours as needed for wheezing or shortness of breath.), Disp: 20.1 g, Rfl: 1 .  atorvastatin (LIPITOR) 20 MG tablet, TAKE 1 Tablet BY MOUTH ONCE EVERY DAY (Patient not taking: Reported on 04/28/2020), Disp: 90 tablet, Rfl: 1 .  montelukast (SINGULAIR) 10 MG tablet, TAKE 1 Tablet BY MOUTH ONCE DAILY AS NEEDED (Patient not taking: Reported on 04/28/2020), Disp: 90 tablet, Rfl: 1 .  nitroGLYCERIN (NITROSTAT) 0.4 MG SL tablet, Place 1 tablet (0.4 mg total) under the tongue every 5 (five) minutes as needed for chest pain. (Patient not taking: No sig reported), Disp: 25 tablet, Rfl: prn .  omeprazole (PRILOSEC) 40 MG capsule, TAKE 1 Capsule  BY MOUTH TWICE DAILY (Patient not taking: Reported on 04/28/2020), Disp: 180 capsule, Rfl: 1   Review of Systems  Per HPI unless specifically indicated above     Objective:    BP 122/84   Pulse 91  Temp (!) 96.6 F (35.9 C)   SpO2 97%   Wt Readings from Last 3 Encounters:  04/23/20 170 lb (77.1 kg)  12/20/19 178 lb (80.7 kg)  11/27/19 174 lb (78.9 kg)    Physical Exam Constitutional:      General: She is not in acute distress.    Appearance: She is not toxic-appearing.  HENT:     Head: Normocephalic and atraumatic.  Cardiovascular:     Rate and Rhythm: Normal rate and regular rhythm.     Pulses:          Femoral pulses are 2+ on the left side.      Dorsalis pedis pulses are 2+ on the right side and 2+ on the left side.  Pulmonary:     Effort: Pulmonary effort is normal. No respiratory distress.     Breath sounds: No stridor. Wheezing present. No rhonchi or rales.      Comments: Scattered expiratory wheezes Abdominal:     General: Bowel sounds are normal.     Palpations: Abdomen is soft.     Tenderness: There is no abdominal tenderness.  Musculoskeletal:     Left upper leg: Tenderness present. No bony tenderness.     Right lower leg: No edema.     Left lower leg: Tenderness present. No swelling.     Comments: There is tenderness of the LLE, lower leg and thigh.  There is no swelling or edema.  There is mottling, reddish weblike pattern on the LLE extending from mid-thigh down to the calf.  It is on the lateral side and does not extend to the medial side.    Pt is walking very slowly and with limping motion.  She says she uses a walker when at home.   Skin:    General: Skin is warm and dry.  Neurological:     Mental Status: She is alert and oriented to person, place, and time.  Psychiatric:        Attention and Perception: Attention normal.        Speech: Speech normal.        Behavior: Behavior normal. Behavior is cooperative.               Assessment & Plan:    Encounter Diagnoses  Name Primary?  . Pain of left lower extremity Yes  . COPD exacerbation ([place])   . Tobacco use disorder   . Essential hypertension   . Screening for diabetes mellitus   . Elevated glucose   . Hyperlipidemia, unspecified hyperlipidemia type     -Obtain ABI.  (MA entered ordered and scheduled appt for Monday) -Get labs.  Pt is to go get blood drawn when she leaves office today -Urged smoking cessation -pt is given sample Prednisone pack for COPD exacacerbation.  She has inhalers and nebs to use  needed -pt was given cafa/application for [employer] financial assistance -discussed with pt that pain LLE appears to be vascular in nature.  After ABI results reviewed, will  Consider Further testing (angiogram, CTA or MR angiogram) Or refer to vascular surgeon.  Pt is in agreement with plan -pt signed papers to complete her [facility] renewal -she needs to get  back on her atorvastatiin.  Refill sent -pt has appt for follow up here later this month.  She is to contact office for changes or worsening

## 2020-05-02 ENCOUNTER — Ambulatory Visit (HOSPITAL_COMMUNITY)
Admission: RE | Admit: 2020-05-02 | Discharge: 2020-05-02 | Disposition: A | Discharge status: Home / Self Care | Payer: Self-pay | Source: Ambulatory Visit | Attending: Physician Assistant | Admitting: Physician Assistant

## 2020-05-02 ENCOUNTER — Other Ambulatory Visit: Payer: Self-pay

## 2020-05-02 DIAGNOSIS — M79605 Pain in left leg: Secondary | ICD-10-CM | POA: Insufficient documentation

## 2020-05-03 ENCOUNTER — Telehealth: Payer: Self-pay | Admitting: Physician Assistant

## 2020-05-03 DIAGNOSIS — M79605 Pain in left leg: Secondary | ICD-10-CM

## 2020-05-03 NOTE — Telephone Encounter (Signed)
Pt was called and reviewed results blood labs and ABI.  These tests were unrevealing.  Still suspect vascular cause of her leg pain.  Will refer to surgeon for input.  Pt is submitting her application for [employer] financial assistance tomorrow.  She says that today her leg pain is a bit worse and she has used a heating pad on it.  She still has the same splotchy rash.

## 2020-05-04 ENCOUNTER — Other Ambulatory Visit: Payer: Self-pay | Admitting: Physician Assistant

## 2020-05-04 ENCOUNTER — Telehealth: Payer: Self-pay

## 2020-05-04 NOTE — Telephone Encounter (Signed)
Returned client's call back, she will be bringing all her documents to complete her enrollment for [facility] as well as [employer] on 05/05/20   [place]/[facility]

## 2020-05-16 ENCOUNTER — Ambulatory Visit: Payer: Self-pay | Admitting: Physician Assistant

## 2020-05-16 ENCOUNTER — Other Ambulatory Visit: Payer: Self-pay | Admitting: Physician Assistant

## 2020-05-16 ENCOUNTER — Encounter: Payer: Self-pay | Admitting: Physician Assistant

## 2020-05-16 VITALS — BP 114/78 | HR 78 | Temp 96.9°F | Wt 171.0 lb

## 2020-05-16 DIAGNOSIS — E785 Hyperlipidemia, unspecified: Secondary | ICD-10-CM

## 2020-05-16 DIAGNOSIS — I10 Essential (primary) hypertension: Secondary | ICD-10-CM

## 2020-05-16 DIAGNOSIS — R8290 Unspecified abnormal findings in urine: Secondary | ICD-10-CM

## 2020-05-16 DIAGNOSIS — J449 Chronic obstructive pulmonary disease, unspecified: Secondary | ICD-10-CM

## 2020-05-16 DIAGNOSIS — F17200 Nicotine dependence, unspecified, uncomplicated: Secondary | ICD-10-CM

## 2020-05-16 DIAGNOSIS — M79605 Pain in left leg: Secondary | ICD-10-CM

## 2020-05-16 DIAGNOSIS — R319 Hematuria, unspecified: Secondary | ICD-10-CM

## 2020-05-16 DIAGNOSIS — Z1211 Encounter for screening for malignant neoplasm of colon: Secondary | ICD-10-CM

## 2020-05-16 LAB — POCT URINALYSIS DIPSTICK
Bilirubin, UA: NEGATIVE
Glucose, UA: NEGATIVE
Ketones, UA: NEGATIVE
Leukocytes, UA: NEGATIVE
Nitrite, UA: NEGATIVE
Protein, UA: NEGATIVE
Spec Grav, UA: 1.01 (ref 1.010–1.025)
Urobilinogen, UA: 0.2 E.U./dL
pH, UA: 5.5 (ref 5.0–8.0)

## 2020-05-16 MED ORDER — CEPHALEXIN 500 MG PO CAPS: 500.0000 mg | ORAL_CAPSULE | Freq: Four times a day (QID) | ORAL | 0 refills | Status: AC

## 2020-05-16 NOTE — Progress Notes (Signed)
BP 114/78   Pulse 78   Temp (!) 96.9 F (36.1 C)   Wt 171 lb (77.6 kg)   SpO2 96%   BMI 34.54 kg/m    Subjective:    Patient ID: Pamela Livingston, female    DOB: August 28, 1965, 55 y.o.   MRN: [id number]  HPI: Pamela Livingston is a 55 y.o. female presenting on 05/16/2020 for Follow-up   HPI    Pt had a negative covid 19 screening questionnaire.   Pt is 63yoF who presents for follow up LLE pain.  She also complains of Strong odor in urine started Thursday.  She says her Breathing improved after prednisone.  She is still smoking but has cut back to about 4/day   She says the Pain in her left leg may be a little bit worse.  It is keeping her awake at night.  ABI- 05/02/20 -  normal Doppler 04/24/20 - negative cmp & cbc - 04/28/20 - unremarkable Xray sept 2021- normal Pain in the LLE started in the fall and the mottling started the last week of march and the pain has increased substantially since the mottling began.    Relevant past medical, surgical, family and social history reviewed and updated as indicated. Interim medical history since our last visit reviewed. Allergies and medications reviewed and updated.    Current Outpatient Medications:  .  acetaminophen (TYLENOL) 500 MG tablet, Take 500 mg by mouth every 6 (six) hours as needed., Disp: , Rfl:  .  albuterol (PROVENTIL) (2.5 MG/3ML) 0.083% nebulizer solution, INHALE 1 VIAL VIA NEBULIZER EVERY 6 HOURS AS NEEDED FOR WHEEZING OR SHORTNESS OF BREATH, Disp: 225 mL, Rfl: 0 .  atorvastatin (LIPITOR) 20 MG tablet, TAKE 1 Tablet BY MOUTH ONCE EVERY DAY, Disp: 90 tablet, Rfl: 1 .  cyclobenzaprine (FLEXERIL) 10 MG tablet, Take 1 tablet (10 mg total) by mouth 3 (three) times daily as needed for muscle spasms., Disp: 30 tablet, Rfl: 0 .  diphenhydrAMINE (BENADRYL) 25 MG tablet, Take 75 mg by mouth at bedtime. , Disp: , Rfl:  .  Fluticasone-Salmeterol (ADVAIR DISKUS) 100-50 MCG/DOSE AEPB, Inhale 1 puff into the lungs in the morning and at  bedtime., Disp: 60 each, Rfl: 1 .  linaclotide (LINZESS) 290 MCG CAPS capsule, Take 1 capsule (290 mcg total) by mouth daily before breakfast. (Patient taking differently: Take 290 mcg by mouth daily as needed (constipation).), Disp: 15 capsule, Rfl: 0 .  lisinopril (ZESTRIL) 20 MG tablet, Take 1 tablet (20 mg total) by mouth daily., Disp: 30 tablet, Rfl: 1 .  loratadine-pseudoephedrine (CLARITIN-D 24-HOUR) 10-240 MG 24 hr tablet, Take 1 tablet by mouth in the morning. , Disp: , Rfl:  .  montelukast (SINGULAIR) 10 MG tablet, TAKE 1 Tablet BY MOUTH ONCE DAILY AS NEEDED, Disp: 90 tablet, Rfl: 1 .  nitroGLYCERIN (NITROSTAT) 0.4 MG SL tablet, Place 1 tablet (0.4 mg total) under the tongue every 5 (five) minutes as needed for chest pain., Disp: 25 tablet, Rfl: prn .  omeprazole (PRILOSEC) 40 MG capsule, TAKE 1 Capsule  BY MOUTH TWICE DAILY, Disp: 180 capsule, Rfl: 1 .  PROVENTIL HFA 108 (90 Base) MCG/ACT inhaler, INHALE 2 PUFFS BY MOUTH EVERY 6 HOURS AS NEEDED FOR COUGHING, WHEEZING, OR SHORTNESS OF BREATH (Patient taking differently: Inhale 2 puffs into the lungs every 6 (six) hours as needed for wheezing or shortness of breath.), Disp: 20.1 g, Rfl: 1    Review of Systems  Per HPI unless specifically indicated above  Objective:    BP 114/78   Pulse 78   Temp (!) 96.9 F (36.1 C)   Wt 171 lb (77.6 kg)   SpO2 96%   BMI 34.54 kg/m   Wt Readings from Last 3 Encounters:  05/16/20 171 lb (77.6 kg)  04/23/20 170 lb (77.1 kg)  12/20/19 178 lb (80.7 kg)    Physical Exam Vitals reviewed.  Constitutional:      General: She is not in acute distress.    Appearance: She is well-developed. She is not toxic-appearing.  HENT:     Head: Normocephalic and atraumatic.  Cardiovascular:     Rate and Rhythm: Normal rate and regular rhythm.     Pulses:          Dorsalis pedis pulses are 2+ on the right side and 2+ on the left side.  Pulmonary:     Effort: Pulmonary effort is normal. No respiratory  distress.     Breath sounds: Wheezing present. No rhonchi.  Abdominal:     General: Bowel sounds are normal.     Palpations: Abdomen is soft. There is no mass.     Tenderness: There is abdominal tenderness in the suprapubic area. There is no guarding or rebound.  Musculoskeletal:     Cervical back: Neck supple.     Comments: Mottling LLE.  Tenderness LLE.  Pain of the leg with ankle ROM.  No pitting edema.    Lymphadenopathy:     Cervical: No cervical adenopathy.  Skin:    General: Skin is warm and dry.  Neurological:     Mental Status: She is alert and oriented to person, place, and time.  Psychiatric:        Behavior: Behavior normal.               Assessment & Plan:     Encounter Diagnoses  Name Primary?  . Pain of left lower extremity Yes  . Bad odor of urine   . Chronic obstructive pulmonary disease, unspecified COPD type ([place])   . Hematuria, unspecified type   . Screening for colon cancer   . Tobacco use disorder   . Essential hypertension   . Hyperlipidemia, unspecified hyperlipidemia type       -rx keflex for urine.  Recheck the urine at next appt -pt to keep appointment with vascular surgeon this week as scheduled for evaluation of the LLE -Encouraged smoking cessation -pt is given FIT test for colon cancer screening -Will sceduled f/u for 3 month for routine.  May need sooner f/u depending on results of appt with surgeon.  Pt states understanding and is in agreement

## 2020-05-19 ENCOUNTER — Encounter: Payer: Self-pay | Admitting: Vascular Surgery

## 2020-05-19 ENCOUNTER — Other Ambulatory Visit: Payer: Self-pay | Admitting: *Deleted

## 2020-05-19 ENCOUNTER — Other Ambulatory Visit: Payer: Self-pay

## 2020-05-19 ENCOUNTER — Ambulatory Visit (INDEPENDENT_AMBULATORY_CARE_PROVIDER_SITE_OTHER): Payer: Self-pay | Admitting: Vascular Surgery

## 2020-05-19 VITALS — BP 111/79 | HR 89 | Temp 98.2°F | Resp 20 | Ht 59.0 in | Wt 170.0 lb

## 2020-05-19 DIAGNOSIS — R231 Pallor: Secondary | ICD-10-CM

## 2020-05-19 NOTE — Progress Notes (Signed)
Referring Physician: Soyla Dryer PA  Patient name: Pamela Livingston MRN: [id number] DOB: 21-Mar-1965 Sex: female  REASON FOR CONSULT: Livedo reticularis left leg  HPI: Pamela Livingston is a 55 y.o. female, with a 2-year history of left hip radiating to ankle pain.  She denies any prior trauma.  She states that over the last month the pain in her left leg became worse.  It has gotten to the point where she now uses a walker to get around her house.  She does not really describe thigh or hip claudication.  It is fairly constant pain.  She also states that the skin is very tender to touch from the knee down to the foot.  She started noticing some skin changes about 2 weeks ago with a lacy type pattern throughout the thigh and calf.  This has not improved.  She has no symptoms in the right leg.  She does occasionally have some numbness and tingling in the toes.  Does not really complain of back pain.  She currently smokes daily and is down to 4 cigarettes and is trying to quit.  She is on a statin.  Past Medical History:  Diagnosis Date  . Asthma   . Chronic pelvic pain in female   . COPD (chronic obstructive pulmonary disease) ([place])   . Endometriosis   . Polysubstance abuse ([facility])    marijuana, cocaine, benzos  . Stomach ulcer   . Suicide attempt ([place]) 2008   by phenergan overdose  . Syncope    Past Surgical History:  Procedure Laterality Date  . ABDOMINAL HYSTERECTOMY    . CERVICAL CONE BIOPSY     cervical cancer  . CESAREAN SECTION  1993  . CHOLECYSTECTOMY    . RADICAL HYSTERECTOMY WITH TRANSPOSITION OF OVARIES    . TOOTH EXTRACTION    . TUBAL LIGATION  1993    Family History  Problem Relation Age of Onset  . Heart disease Mother   . Hypertension Mother   . Diabetes Mother   . Hypertension Father   . Heart attack Maternal Uncle   . COPD Paternal Grandfather   . Heart attack Maternal Uncle   . Colon cancer Neg Hx     SOCIAL HISTORY: Social History   Socioeconomic History   . Marital status: Divorced    Spouse name: Not on file  . Number of children: Not on file  . Years of education: Not on file  . Highest education level: Not on file  Occupational History  . Not on file  Tobacco Use  . Smoking status: Current Every Day Smoker    Packs/day: 0.50    Years: 27.00    Pack years: 13.50    Types: Cigarettes  . Smokeless tobacco: Never Used  Vaping Use  . Vaping Use: Never used  Substance and Sexual Activity  . Alcohol use: Not Currently  . Drug use: Yes    Types: Marijuana  . Sexual activity: Yes    Birth control/protection: Surgical  Other Topics Concern  . Not on file  Social History Narrative  . Not on file   Social Determinants of Health   Financial Resource Strain: Not on file  Food Insecurity: Not on file  Transportation Needs: Not on file  Physical Activity: Not on file  Stress: Not on file  Social Connections: Not on file  Intimate Partner Violence: Not on file    Allergies  Allergen Reactions  . Asa [Aspirin] Anaphylaxis, Hives and Swelling  Current Outpatient Medications  Medication Sig Dispense Refill  . acetaminophen (TYLENOL) 500 MG tablet Take 500 mg by mouth every 6 (six) hours as needed.    Marland Kitchen albuterol (PROVENTIL) (2.5 MG/3ML) 0.083% nebulizer solution INHALE 1 VIAL VIA NEBULIZER EVERY 6 HOURS AS NEEDED FOR WHEEZING OR SHORTNESS OF BREATH 225 mL 0  . atorvastatin (LIPITOR) 20 MG tablet TAKE 1 Tablet BY MOUTH ONCE EVERY DAY 90 tablet 1  . cephALEXin (KEFLEX) 500 MG capsule Take 1 capsule (500 mg total) by mouth 4 (four) times daily for 7 days. 28 capsule 0  . cyclobenzaprine (FLEXERIL) 10 MG tablet Take 1 tablet (10 mg total) by mouth 3 (three) times daily as needed for muscle spasms. 30 tablet 0  . diphenhydrAMINE (BENADRYL) 25 MG tablet Take 75 mg by mouth at bedtime.     . Fluticasone-Salmeterol (ADVAIR DISKUS) 100-50 MCG/DOSE AEPB Inhale 1 puff into the lungs in the morning and at bedtime. 60 each 1  . linaclotide  (LINZESS) 290 MCG CAPS capsule Take 1 capsule (290 mcg total) by mouth daily before breakfast. (Patient taking differently: Take 290 mcg by mouth daily as needed (constipation).) 15 capsule 0  . lisinopril (ZESTRIL) 20 MG tablet Take 1 tablet (20 mg total) by mouth daily. 30 tablet 1  . loratadine-pseudoephedrine (CLARITIN-D 24-HOUR) 10-240 MG 24 hr tablet Take 1 tablet by mouth in the morning.     . montelukast (SINGULAIR) 10 MG tablet TAKE 1 Tablet BY MOUTH ONCE DAILY AS NEEDED 90 tablet 1  . nitroGLYCERIN (NITROSTAT) 0.4 MG SL tablet Place 1 tablet (0.4 mg total) under the tongue every 5 (five) minutes as needed for chest pain. 25 tablet prn  . omeprazole (PRILOSEC) 40 MG capsule TAKE 1 Capsule  BY MOUTH TWICE DAILY 180 capsule 1  . PROVENTIL HFA 108 (90 Base) MCG/ACT inhaler INHALE 2 PUFFS BY MOUTH EVERY 6 HOURS AS NEEDED FOR COUGHING, WHEEZING, OR SHORTNESS OF BREATH (Patient taking differently: Inhale 2 puffs into the lungs every 6 (six) hours as needed for wheezing or shortness of breath.) 20.1 g 1   No current facility-administered medications for this visit.    ROS:   General:  No weight loss, Fever, chills  HEENT: No recent headaches, no nasal bleeding, no visual changes, no sore throat  Neurologic: No dizziness, blackouts, seizures. No recent symptoms of stroke or mini- stroke. No recent episodes of slurred speech, or temporary blindness.  Cardiac: No recent episodes of chest pain/pressure, no shortness of breath at rest.  No shortness of breath with exertion.  Denies history of atrial fibrillation or irregular heartbeat  Vascular: No history of rest pain in feet.  No history of claudication.  No history of non-healing ulcer, No history of DVT   Pulmonary: No home oxygen, no productive cough, no hemoptysis,  No asthma or wheezing  Musculoskeletal:  [ ]  Arthritis, [ ]  Low back pain,  [ ]  Joint pain  Hematologic:No history of hypercoagulable state.  No history of easy bleeding.   No history of anemia  Gastrointestinal: No hematochezia or melena,  No gastroesophageal reflux, no trouble swallowing  Urinary: [ ]  chronic Kidney disease, [ ]  on HD - [ ]  MWF or [ ]  TTHS, [ ]  Burning with urination, [ ]  Frequent urination, [ ]  Difficulty urinating;   Skin: No rashes  Psychological: No history of anxiety,  No history of depression   Physical Examination   Vitals:   05/19/20 1050  BP: 111/79  Pulse: 89  Resp:  20  Temp: 98.2 F (36.8 C)  SpO2: 99%  Weight: 170 lb (77.1 kg)  Height: 4\' 11"  (1.499 m)    General:  Alert and oriented, no acute distress HEENT: Normal Neck: No JVD Cardiac: Regular Rate and Rhythm Abdomen: Soft, non-tender, non-distended, no mass Skin: No rash, livedo reticularis pattern involving primarily the left calf and lower portion of the left thigh circumferentially no skin changes really present on the right leg Extremity Pulses:  2+ radial, brachial, 2+ right 1+ left femoral, 2+ dorsalis pedis pulses bilaterally Musculoskeletal: No deformity or edema  Neurologic: Upper and lower extremity motor 5/5 and symmetric  DATA:  Patient had a venous duplex of the left leg April 24, 2020.  I reviewed this today.  This showed no evidence of DVT.  She also had bilateral ABIs performed at [facility] May 02, 2020.  Right side was 1.06 left side 1.06 normal arterial waveforms.  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  This may have represented an atheroembolic event with showering to the skin.  Potentially this could be autoimmune related as well.   PLAN: We will schedule patient for CT angio abdomen pelvis with lower extremity runoff to see if there is an area of possible arterial arterial embolic phenomenon.  She will follow up with me in 1 to 2 weeks after her CT scan.   Ruta Hinds, MD [facility] [place] Office: [phone]

## 2020-05-24 LAB — IFOBT (OCCULT BLOOD): IFOBT: POSITIVE

## 2020-05-26 ENCOUNTER — Other Ambulatory Visit: Payer: Self-pay | Admitting: Physician Assistant

## 2020-05-26 DIAGNOSIS — R195 Other fecal abnormalities: Secondary | ICD-10-CM

## 2020-05-31 ENCOUNTER — Ambulatory Visit (HOSPITAL_COMMUNITY)
Admission: RE | Admit: 2020-05-31 | Discharge: 2020-05-31 | Disposition: A | Discharge status: Home / Self Care | Payer: Self-pay | Source: Ambulatory Visit | Attending: Vascular Surgery | Admitting: Vascular Surgery

## 2020-05-31 ENCOUNTER — Other Ambulatory Visit: Payer: Self-pay

## 2020-05-31 DIAGNOSIS — R231 Pallor: Secondary | ICD-10-CM | POA: Insufficient documentation

## 2020-05-31 LAB — POCT I-STAT CREATININE: Creatinine, Ser: 0.9 mg/dL (ref 0.44–1.00)

## 2020-05-31 MED ORDER — IOHEXOL 350 MG/ML SOLN
100.0000 mL | Freq: Once | INTRAVENOUS | Status: AC | PRN
  Administered 2020-05-31: 100 mL via INTRAVENOUS

## 2020-06-02 ENCOUNTER — Ambulatory Visit (INDEPENDENT_AMBULATORY_CARE_PROVIDER_SITE_OTHER): Payer: No Typology Code available for payment source | Admitting: Vascular Surgery

## 2020-06-02 ENCOUNTER — Telehealth: Payer: Self-pay

## 2020-06-02 ENCOUNTER — Encounter: Payer: Self-pay | Admitting: Vascular Surgery

## 2020-06-02 ENCOUNTER — Other Ambulatory Visit: Payer: Self-pay

## 2020-06-02 VITALS — BP 104/74 | HR 88 | Temp 98.3°F | Resp 20 | Ht 59.0 in | Wt 169.0 lb

## 2020-06-02 DIAGNOSIS — R231 Pallor: Secondary | ICD-10-CM

## 2020-06-02 NOTE — Progress Notes (Signed)
Patient is a 55 year old female who returns for follow-up today.  He was last seen May 19, 2020 for evaluation of livedo reticularis left leg.  He has pain in her left hip radiating to the ankle.  This is unchanged from April 28.  She previously had a venous duplex which showed no evidence of DVT.  She also had bilateral ABIs performed which were normal.  She returns today after recent CT angiogram.  Patient also states that recently her colon screening test came out positive and she is scheduled for colonoscopy in the near future.  She is quite anxious about this.  Physical exam:  Vitals:   06/02/20 1030  BP: 104/74  Pulse: 88  Resp: 20  Temp: 98.3 F (36.8 C)  SpO2: 96%  Weight: 169 lb (76.7 kg)  Height: 4\' 11"  (1.499 m)    Extremities: Lacy dark-colored pattern lateral aspect of left thigh and left lateral calf essentially unchanged from her last office visit on April 28.  Data: I reviewed the images and interpreted the images of the patient's CT angiogram of the abdomen and pelvis.  This shows essentially normal arterial tree with no obstruction in the lower extremities and no significant atherosclerotic burden in the abdominal aorta or iliac arteries bilaterally.  Assessment: Livedo reticularis left lower extremity no obvious vascular etiology from an arterial standpoint for this.  Plan: Patient will follow up with me on an as-needed basis.  Will leave at the discretion of her primary provider whether or not to consider autoimmune work-up.  Ruta Hinds, MD [facility] [place] Office: [phone]

## 2020-06-02 NOTE — Telephone Encounter (Signed)
Received a call from [facility] staff Darcey Nora regarding Care Connect client that is currently in the specialist office for an appointment today.  Discussed Care Connect and services and the care connect card that was shown. Per notes in [facility] EMR. [employer] program manager assisted client with [facility] Financial assistance application and assisted in submitting that application on client's behalf. [employer] of Care connect was able to view acceptance letter of 100% [facility] financial assistance from 05/05/20 until 11/04/20.  Updated Care Coordination note. Updated Darcey Nora at [facility] specialist with approval information and approval letter sent securely to specialist office.   Debria Garret RN [employer]

## 2020-06-13 ENCOUNTER — Encounter: Payer: Self-pay | Admitting: *Deleted

## 2020-06-14 ENCOUNTER — Ambulatory Visit: Payer: Self-pay | Admitting: Physician Assistant

## 2020-06-14 ENCOUNTER — Encounter: Payer: Self-pay | Admitting: Physician Assistant

## 2020-06-14 VITALS — BP 118/84 | HR 68 | Temp 97.8°F | Wt 174.0 lb

## 2020-06-14 DIAGNOSIS — M25562 Pain in left knee: Secondary | ICD-10-CM

## 2020-06-14 DIAGNOSIS — R262 Difficulty in walking, not elsewhere classified: Secondary | ICD-10-CM

## 2020-06-14 DIAGNOSIS — F17200 Nicotine dependence, unspecified, uncomplicated: Secondary | ICD-10-CM

## 2020-06-14 DIAGNOSIS — M2550 Pain in unspecified joint: Secondary | ICD-10-CM

## 2020-06-14 DIAGNOSIS — R231 Pallor: Secondary | ICD-10-CM

## 2020-06-14 MED ORDER — CYCLOBENZAPRINE HCL 10 MG PO TABS: 10.0000 mg | ORAL_TABLET | Freq: Three times a day (TID) | ORAL | 0 refills | Status: DC | PRN

## 2020-06-14 NOTE — Patient Instructions (Signed)
How to Use a Kasandra Knudsen  Canes are used to help with walking. Using a cane makes you more stable, reduces pain, and eases strain on certain muscle groups. There are various kinds of canes. Most have either a single point, four points (quad cane), or three points at the bottom. The best kind of cane for you depends on what you need it for. People with arthritis generally do well with a single-point cane. People who have certain neurological conditions, such as people who have had a stroke, may do better with a quad cane because it allows them to put more weight on it (support more of their body weight). How to choose a cane that fits It is important to use a cane that fits properly. A cane fits properly if the top of the cane comes to your wrist joint when you are standing upright with your arm relaxed at your side. How to use your cane Hold your cane in the hand opposite the injured or weaker side. Always move the cane and the foot of the weaker side with each other (in unison). Walking  Put as much weight on the cane as necessary to make walking comfortable, stable, and smooth.  Stand tall with good posture and look ahead, not down at your feet.  Hold the cane about 2 inches (5 cm) in front or to the side of you.  Each time you take a step with your injured leg, move the cane at the same time to help balance you. Going up steps  Step first with your stronger foot.  Move the cane and the weaker foot up the step at the same time.  Always hold the railing with your free hand. Going down steps  Step down first with the cane and your weaker foot.  Then follow with your stronger foot.  Always hold the railing with your free hand. Safety tips for home Take these steps to make your home safer when you are walking with a cane:  Be familiar with your home environment.  Have sturdy handrails in your bathrooms and hallways.  Wear nonslip, comfortable, well-fitting shoes.  Use night-lights in  the dark.  Keep floor surfaces clean and dry.  Keep high-traffic areas uncluttered.  Remove any rugs, cords, or loose objects from the floor. Contact a health care provider if:  You still feel unsteady on your feet while using the cane.  You develop new pain, such as pain in your back, shoulder, wrist, or hip.  You develop any numbness or tingling. Get help right away if:  You fall. Summary  Using a cane makes you more stable, reduces pain, and eases strain on certain muscle groups.  A cane fits properly if the top of the cane comes to your wrist joint when you are standing upright with your arm relaxed at your side.  The best kind of cane for you depends on what you need it for.  Hold your cane in the hand opposite the injured or weaker side.  Always move the cane and the foot of the weaker side with each other (in unison). This information is not intended to replace advice given to you by your health care provider. Make sure you discuss any questions you have with your health care provider. Document Revised: 10/27/2019 Document Reviewed: 10/27/2019 Elsevier Patient Education  2021 [employer].

## 2020-06-14 NOTE — Progress Notes (Signed)
   There were no vitals taken for this visit.   Subjective:    Patient ID: Pamela Livingston, female    DOB: Oct 02, 1965, 55 y.o.   MRN: [id number]  HPI: Pamela Livingston is a 55 y.o. female presenting on 06/14/2020 for No chief complaint on file.   HPI    Pt had a negative covid 19 screening questionnaire.  Pt is here to day for left knee pain.  She says she [place] in tub on Friday and bumped her knee and she is worried it is broken.    Pt was seen recently by vascular surgery for her livedo reticularis and the CT that was ordered was unremarkable..     Relevant past medical, surgical, family and social history reviewed and updated as indicated. Interim medical history since our last visit reviewed. Allergies and medications reviewed and updated.  Review of Systems  Per HPI unless specifically indicated above     Objective:    There were no vitals taken for this visit.  Wt Readings from Last 3 Encounters:  06/02/20 169 lb (76.7 kg)  05/19/20 170 lb (77.1 kg)  05/16/20 171 lb (77.6 kg)    Vitals:   06/14/20 1100  BP: 118/84  Pulse: 68  Temp: 97.8 F (36.6 C)     Physical Exam Constitutional:      General: She is not in acute distress.    Appearance: She is not toxic-appearing.  HENT:     Head: Normocephalic and atraumatic.  Pulmonary:     Effort: Pulmonary effort is normal. No respiratory distress.  Musculoskeletal:     Left knee: No swelling, deformity, effusion, ecchymosis or bony tenderness. Normal range of motion. Tenderness present.     Right lower leg: No edema.     Left lower leg: No edema.     Comments: No instability  Skin:    Comments: Netlike rash BLE as seen at previous appointment  Neurological:     Mental Status: She is alert and oriented to person, place, and time.             Assessment & Plan:    Encounter Diagnoses  Name Primary?  . Left knee pain, unspecified chronicity Yes  . Livedo reticularis   . Tobacco use disorder   . Difficulty  walking   . Arthralgia, unspecified joint       -pt encouraged to apply heat to the area. -rx Flexeril and APAP prn.  Pt is reminded to avoid driving on flexeril due to sedation -Autoimmune w/u -encouraged Smoking cessation -pt is given a Quad cane and instructions on how to use it in efforts to reduce risk of a fall and further injury   Pt was seen by vascular surgery and had work-up including CT angio abd aorta with iliofemoral runoff and was found to have no obvious vascular etiology from arterial standpoint.   Recommended autoimmune work-up   Pt to Follow up July as scheduled

## 2020-07-01 ENCOUNTER — Other Ambulatory Visit (HOSPITAL_COMMUNITY)
Admission: RE | Admit: 2020-07-01 | Discharge: 2020-07-01 | Disposition: A | Discharge status: Home / Self Care | Payer: No Typology Code available for payment source | Source: Ambulatory Visit | Attending: Physician Assistant | Admitting: Physician Assistant

## 2020-07-01 ENCOUNTER — Other Ambulatory Visit: Payer: Self-pay

## 2020-07-01 DIAGNOSIS — F17200 Nicotine dependence, unspecified, uncomplicated: Secondary | ICD-10-CM

## 2020-07-01 DIAGNOSIS — R231 Pallor: Secondary | ICD-10-CM

## 2020-07-01 DIAGNOSIS — M25562 Pain in left knee: Secondary | ICD-10-CM | POA: Insufficient documentation

## 2020-07-01 DIAGNOSIS — M2550 Pain in unspecified joint: Secondary | ICD-10-CM | POA: Insufficient documentation

## 2020-07-01 DIAGNOSIS — R262 Difficulty in walking, not elsewhere classified: Secondary | ICD-10-CM | POA: Insufficient documentation

## 2020-07-01 LAB — COMPREHENSIVE METABOLIC PANEL
ALT: 13 U/L (ref 0–44)
AST: 16 U/L (ref 15–41)
Albumin: 4.3 g/dL (ref 3.5–5.0)
Alkaline Phosphatase: 88 U/L (ref 38–126)
Anion gap: 9 (ref 5–15)
BUN: 19 mg/dL (ref 6–20)
CO2: 19 mmol/L — ABNORMAL LOW (ref 22–32)
Calcium: 9.2 mg/dL (ref 8.9–10.3)
Chloride: 109 mmol/L (ref 98–111)
Creatinine, Ser: 0.87 mg/dL (ref 0.44–1.00)
GFR, Estimated: >60 mL/min (ref >60)
Glucose, Bld: 110 mg/dL — ABNORMAL HIGH (ref 70–99)
Potassium: 3.8 mmol/L (ref 3.5–5.1)
Sodium: 137 mmol/L (ref 135–145)
Total Bilirubin: 0.5 mg/dL (ref 0.3–1.2)
Total Protein: 7.8 g/dL (ref 6.5–8.1)

## 2020-07-01 LAB — CBC WITH DIFFERENTIAL/PLATELET
Abs Immature Granulocytes: 0.03 10*3/uL (ref 0.00–0.07)
Basophils Absolute: 0.1 10*3/uL (ref 0.0–0.1)
Basophils Relative: 1 %
Eosinophils Absolute: 0.5 10*3/uL (ref 0.0–0.5)
Eosinophils Relative: 5 %
HCT: 42.9 % (ref 36.0–46.0)
Hemoglobin: 14 g/dL (ref 12.0–15.0)
Immature Granulocytes: 0 %
Lymphocytes Relative: 39 %
Lymphs Abs: 3.8 10*3/uL (ref 0.7–4.0)
MCH: 32.1 pg (ref 26.0–34.0)
MCHC: 32.6 g/dL (ref 30.0–36.0)
MCV: 98.4 fL (ref 80.0–100.0)
Monocytes Absolute: 0.6 10*3/uL (ref 0.1–1.0)
Monocytes Relative: 6 %
Neutro Abs: 4.8 10*3/uL (ref 1.7–7.7)
Neutrophils Relative %: 49 %
Platelets: 372 10*3/uL (ref 150–400)
RBC: 4.36 MIL/uL (ref 3.87–5.11)
RDW: 13.4 % (ref 11.5–15.5)
WBC: 9.7 10*3/uL (ref 4.0–10.5)
nRBC: 0 % (ref 0.0–0.2)

## 2020-07-01 LAB — APTT: aPTT: 34 seconds (ref 24–36)

## 2020-07-01 LAB — C-REACTIVE PROTEIN: CRP: 0.7 mg/dL (ref <1.0)

## 2020-07-01 LAB — SEDIMENTATION RATE: Sed Rate: 55 mm/hr — ABNORMAL HIGH (ref 0–22)

## 2020-07-02 LAB — RHEUMATOID FACTOR: Rheumatoid fact SerPl-aCnc: <10 IU/mL (ref <14.0)

## 2020-07-02 LAB — ANA: Anti Nuclear Antibody (ANA): NEGATIVE

## 2020-07-06 ENCOUNTER — Other Ambulatory Visit: Payer: Self-pay

## 2020-07-06 ENCOUNTER — Other Ambulatory Visit: Payer: Self-pay | Admitting: *Deleted

## 2020-07-06 ENCOUNTER — Encounter: Payer: Self-pay | Admitting: Internal Medicine

## 2020-07-06 ENCOUNTER — Encounter: Payer: Self-pay | Admitting: *Deleted

## 2020-07-06 ENCOUNTER — Ambulatory Visit (INDEPENDENT_AMBULATORY_CARE_PROVIDER_SITE_OTHER): Payer: No Typology Code available for payment source | Admitting: Internal Medicine

## 2020-07-06 VITALS — BP 108/80 | HR 79 | Temp 97.5°F | Ht 59.0 in | Wt 167.4 lb

## 2020-07-06 DIAGNOSIS — R195 Other fecal abnormalities: Secondary | ICD-10-CM

## 2020-07-06 DIAGNOSIS — R1319 Other dysphagia: Secondary | ICD-10-CM

## 2020-07-06 DIAGNOSIS — K219 Gastro-esophageal reflux disease without esophagitis: Secondary | ICD-10-CM

## 2020-07-06 MED ORDER — SUTAB 1479-225-188 MG PO TABS: ORAL_TABLET | ORAL | 0 refills | Status: DC

## 2020-07-06 NOTE — Patient Instructions (Addendum)
We will schedule you today for upper endoscopy to further evaluate your heartburn and difficulty swallowing.  I may perform dilation of your esophagus at that time to help with your swallowing depending on findings.  At the same time we will perform colonoscopy to evaluate your positive FIT testing.  We will attempt to get Su tabs covered which is the pill colon preparation.  Continue on omeprazole.  Lifestyle and home remedies TO MANAGE REFLUX/HEARTBURN    You may eliminate or reduce the frequency of heartburn by making the following lifestyle changes:   Control your weight. Being overweight is a major risk factor for heartburn and GERD. Excess pounds put pressure on your abdomen, pushing up your stomach and causing acid to back up into your esophagus.    Eat smaller meals. 4 TO 6 MEALS A DAY. This reduces pressure on the lower esophageal sphincter, helping to prevent the valve from opening and acid from washing back into your esophagus.     Loosen your belt. Clothes that fit tightly around your waist put pressure on your abdomen and the lower esophageal sphincter.     Eliminate heartburn triggers. Everyone has specific triggers. Common triggers such as fatty or fried foods, spicy food, tomato sauce, carbonated beverages, alcohol, chocolate, mint, garlic, onion, caffeine and nicotine may make heartburn worse.    Avoid stooping or bending. Tying your shoes is OK. Bending over for longer periods to weed your garden isn't, especially soon after eating.    Don't lie down after a meal. Wait at least three to four hours after eating before going to bed, and don't lie down right after eating.    At [facility] we value your feedback. You may receive a survey about your visit today. Please share your experience as we strive to create trusting relationships with our patients to provide genuine, compassionate, quality care.  We appreciate your understanding and patience as we  review any laboratory studies, imaging, and other diagnostic tests that are ordered as we care for you. Our office policy is 5 business days for review of these results, and any emergent or urgent results are addressed in a timely manner for your best interest. If you do not hear from our office in 1 week, please contact us.   We also encourage the use of MyChart, which contains your medical information for your review as well. If you are not enrolled in this feature, an access code is on this after visit summary for your convenience. Thank you for allowing [place] to be involved in your care.  It was great to see you today!  I hope you have a great rest of your summer!!    Elon Alas. Abbey Chatters, D.O. Gastroenterology and Hepatology [facility]

## 2020-07-07 NOTE — Progress Notes (Signed)
Referring Provider: Soyla Dryer, PA-C Primary Care Physician:  Soyla Dryer, PA-C Primary GI:  Dr. Abbey Chatters  Chief Complaint  Patient presents with   Rectal Bleeding    Pamela Livingston she has a stomach ulcer. Has a lot of bleeding   Colonoscopy    +FIT test, never had tcs    HPI:   Pamela Livingston is a 55 y.o. female who presents to clinic today for follow-up visit.  Last seen 09/2019.  At that time she was scheduled for EGD due to worsening reflux as well as dysphagia but she canceled.  She continues to have reflux as well as difficulty swallowing.  Feels as though food and pills get stuck in her substernal region.  Currently taking omeprazole 40 mg twice daily which does help with her reflux though her swallowing issues continue.  Also had a recent positive fit test.  No previous colonoscopy.  No family history of colorectal malignancy.  No unintentional weight loss.  Denies any melena or hematochezia.  No chronic NSAID use.  Does note issues with hemorrhoids in the past.  Past Medical History:  Diagnosis Date   Asthma    Chronic pelvic pain in female    COPD (chronic obstructive pulmonary disease) ([place])    Endometriosis    Polysubstance abuse ([place])    marijuana, cocaine, benzos   Stomach ulcer    Suicide attempt ([place]) 2008   by phenergan overdose   Syncope     Past Surgical History:  Procedure Laterality Date   ABDOMINAL HYSTERECTOMY     CERVICAL CONE BIOPSY     cervical cancer   CESAREAN [place] WITH TRANSPOSITION OF OVARIES     TOOTH EXTRACTION     TUBAL LIGATION  1993    Current Outpatient Medications  Medication Sig Dispense Refill   acetaminophen (TYLENOL) 500 MG tablet Take 500 mg by mouth every 6 (six) hours as needed.     albuterol (PROVENTIL) (2.5 MG/3ML) 0.083% nebulizer solution INHALE 1 VIAL VIA NEBULIZER EVERY 6 HOURS AS NEEDED FOR WHEEZING OR SHORTNESS OF BREATH 225 mL 0   atorvastatin (LIPITOR) 20 MG tablet  TAKE 1 Tablet BY MOUTH ONCE EVERY DAY 90 tablet 1   cyclobenzaprine (FLEXERIL) 10 MG tablet Take 1 tablet (10 mg total) by mouth 3 (three) times daily as needed for muscle spasms. 30 tablet 0   diphenhydrAMINE (BENADRYL) 25 MG tablet Take 75 mg by mouth at bedtime.      Fluticasone-Salmeterol (ADVAIR DISKUS) 100-50 MCG/DOSE AEPB Inhale 1 puff into the lungs in the morning and at bedtime. 60 each 1   linaclotide (LINZESS) 290 MCG CAPS capsule Take 1 capsule (290 mcg total) by mouth daily before breakfast. (Patient taking differently: Take 290 mcg by mouth daily as needed (constipation).) 15 capsule 0   lisinopril (ZESTRIL) 20 MG tablet Take 1 tablet (20 mg total) by mouth daily. 30 tablet 1   loratadine-pseudoephedrine (CLARITIN-D 24-HOUR) 10-240 MG 24 hr tablet Take 1 tablet by mouth in the morning.      montelukast (SINGULAIR) 10 MG tablet TAKE 1 Tablet BY MOUTH ONCE DAILY AS NEEDED 90 tablet 1   nitroGLYCERIN (NITROSTAT) 0.4 MG SL tablet Place 1 tablet (0.4 mg total) under the tongue every 5 (five) minutes as needed for chest pain. 25 tablet prn   omeprazole (PRILOSEC) 40 MG capsule TAKE 1 Capsule  BY MOUTH TWICE DAILY 180 capsule 1   PROVENTIL HFA  108 (90 Base) MCG/ACT inhaler INHALE 2 PUFFS BY MOUTH EVERY 6 HOURS AS NEEDED FOR COUGHING, WHEEZING, OR SHORTNESS OF BREATH (Patient taking differently: Inhale 2 puffs into the lungs every 6 (six) hours as needed for wheezing or shortness of breath.) 20.1 g 1   Sodium Sulfate-Mag Sulfate-KCl (SUTAB) [phone] MG TABS Take as directed 24 tablet 0   No current facility-administered medications for this visit.    Allergies as of 07/06/2020 - Review Complete 07/06/2020  Allergen Reaction Noted   Asa [aspirin] Anaphylaxis, Hives, and Swelling 08/16/2011    Family History  Problem Relation Age of Onset   Heart disease Mother    Hypertension Mother    Diabetes Mother    Hypertension Father    Heart attack Maternal Uncle    COPD Paternal  Grandfather    Heart attack Maternal Uncle    Colon cancer Neg Hx     Social History   Socioeconomic History   Marital status: Divorced    Spouse name: Not on file   Number of children: Not on file   Years of education: Not on file   Highest education level: Not on file  Occupational History   Not on file  Tobacco Use   Smoking status: Every Day    Packs/day: 0.50    Years: 27.00    Pack years: 13.50    Types: Cigarettes   Smokeless tobacco: Never  Vaping Use   Vaping Use: Never used  Substance and Sexual Activity   Alcohol use: Not Currently   Drug use: Yes    Types: Marijuana   Sexual activity: Yes    Birth control/protection: Surgical  Other Topics Concern   Not on file  Social History Narrative   Not on file   Social Determinants of Health   Financial Resource Strain: Not on file  Food Insecurity: Not on file  Transportation Needs: Not on file  Physical Activity: Not on file  Stress: Not on file  Social Connections: Not on file    Subjective: Review of Systems  Constitutional:  Negative for chills and fever.  HENT:  Negative for congestion and hearing loss.   Eyes:  Negative for blurred vision and double vision.  Respiratory:  Negative for cough and shortness of breath.   Cardiovascular:  Negative for chest pain and palpitations.  Gastrointestinal:  Positive for heartburn. Negative for abdominal pain, blood in stool, constipation, diarrhea, melena and vomiting.       Dysphagia  Genitourinary:  Negative for dysuria and urgency.  Musculoskeletal:  Negative for joint pain and myalgias.  Skin:  Negative for itching and rash.  Neurological:  Negative for dizziness and headaches.  Psychiatric/Behavioral:  Negative for depression. The patient is not nervous/anxious.     Objective: BP 108/80   Pulse 79   Temp (!) 97.5 F (36.4 C) (Temporal)   Ht 4\' 11"  (1.499 m)   Wt 167 lb 6.4 oz (75.9 kg)   BMI 33.81 kg/m  Physical Exam Constitutional:       Appearance: Normal appearance.  HENT:     Head: Normocephalic and atraumatic.  Eyes:     Extraocular Movements: Extraocular movements intact.     Conjunctiva/sclera: Conjunctivae normal.  Cardiovascular:     Rate and Rhythm: Normal rate and regular rhythm.  Pulmonary:     Effort: Pulmonary effort is normal.     Comments: Inspiratory and expiratory wheezing bilaterally Abdominal:     General: Bowel sounds are normal.  Palpations: Abdomen is soft.  Musculoskeletal:        General: No swelling. Normal range of motion.     Cervical back: Normal range of motion and neck supple.  Skin:    General: Skin is warm and dry.     Coloration: Skin is not jaundiced.  Neurological:     General: No focal deficit present.     Mental Status: She is alert and oriented to person, place, and time.  Psychiatric:        Mood and Affect: Mood normal.        Behavior: Behavior normal.     Assessment: *GERD *Dysphagia *Positive fit test  Plan: Will schedule for EGD with possible dilation to evaluate for peptic ulcer disease, esophagitis, gastritis, H. Pylori, duodenitis, or other. Will also evaluate for esophageal stricture, Schatzki's ring, esophageal web or other.   At the same time we will perform colonoscopy due to positive FIT testing.  The risks including infection, bleed, or perforation as well as benefits, limitations, alternatives and imponderables have been reviewed with the patient. Potential for esophageal dilation, biopsy, etc. have also been reviewed.  Questions have been answered. All parties agreeable.  Continue on omeprazole 40 mg twice daily.  We will print off home practices to decrease reflux symptoms.  Further recommendations to follow.  07/07/2020 10:22 AM   Disclaimer: This note was dictated with voice recognition software. Similar sounding words can inadvertently be transcribed and may not be corrected upon review.

## 2020-07-07 NOTE — H&P (View-Only) (Signed)
Referring Provider: Soyla Dryer, PA-C Primary Care Physician:  Soyla Dryer, PA-C Primary GI:  Dr. Abbey Chatters  Chief Complaint  Patient presents with   Rectal Bleeding    [name] she has a stomach ulcer. Has a lot of bleeding   Colonoscopy    +FIT test, never had tcs    HPI:   Pamela Livingston is a 55 y.o. female who presents to clinic today for follow-up visit.  Last seen 09/2019.  At that time she was scheduled for EGD due to worsening reflux as well as dysphagia but she canceled.  She continues to have reflux as well as difficulty swallowing.  Feels as though food and pills get stuck in her substernal region.  Currently taking omeprazole 40 mg twice daily which does help with her reflux though her swallowing issues continue.  Also had a recent positive fit test.  No previous colonoscopy.  No family history of colorectal malignancy.  No unintentional weight loss.  Denies any melena or hematochezia.  No chronic NSAID use.  Does note issues with hemorrhoids in the past.  Past Medical History:  Diagnosis Date   Asthma    Chronic pelvic pain in female    COPD (chronic obstructive pulmonary disease) ([place])    Endometriosis    Polysubstance abuse ([place])    marijuana, cocaine, benzos   Stomach ulcer    Suicide attempt ([place]) 2008   by phenergan overdose   Syncope     Past Surgical History:  Procedure Laterality Date   ABDOMINAL HYSTERECTOMY     CERVICAL CONE BIOPSY     cervical cancer   CESAREAN [place] WITH TRANSPOSITION OF OVARIES     TOOTH EXTRACTION     TUBAL LIGATION  1993    Current Outpatient Medications  Medication Sig Dispense Refill   acetaminophen (TYLENOL) 500 MG tablet Take 500 mg by mouth every 6 (six) hours as needed.     albuterol (PROVENTIL) (2.5 MG/3ML) 0.083% nebulizer solution INHALE 1 VIAL VIA NEBULIZER EVERY 6 HOURS AS NEEDED FOR WHEEZING OR SHORTNESS OF BREATH 225 mL 0   atorvastatin (LIPITOR) 20 MG tablet  TAKE 1 Tablet BY MOUTH ONCE EVERY DAY 90 tablet 1   cyclobenzaprine (FLEXERIL) 10 MG tablet Take 1 tablet (10 mg total) by mouth 3 (three) times daily as needed for muscle spasms. 30 tablet 0   diphenhydrAMINE (BENADRYL) 25 MG tablet Take 75 mg by mouth at bedtime.      Fluticasone-Salmeterol (ADVAIR DISKUS) 100-50 MCG/DOSE AEPB Inhale 1 puff into the lungs in the morning and at bedtime. 60 each 1   linaclotide (LINZESS) 290 MCG CAPS capsule Take 1 capsule (290 mcg total) by mouth daily before breakfast. (Patient taking differently: Take 290 mcg by mouth daily as needed (constipation).) 15 capsule 0   lisinopril (ZESTRIL) 20 MG tablet Take 1 tablet (20 mg total) by mouth daily. 30 tablet 1   loratadine-pseudoephedrine (CLARITIN-D 24-HOUR) 10-240 MG 24 hr tablet Take 1 tablet by mouth in the morning.      montelukast (SINGULAIR) 10 MG tablet TAKE 1 Tablet BY MOUTH ONCE DAILY AS NEEDED 90 tablet 1   nitroGLYCERIN (NITROSTAT) 0.4 MG SL tablet Place 1 tablet (0.4 mg total) under the tongue every 5 (five) minutes as needed for chest pain. 25 tablet prn   omeprazole (PRILOSEC) 40 MG capsule TAKE 1 Capsule  BY MOUTH TWICE DAILY 180 capsule 1   PROVENTIL HFA  108 (90 Base) MCG/ACT inhaler INHALE 2 PUFFS BY MOUTH EVERY 6 HOURS AS NEEDED FOR COUGHING, WHEEZING, OR SHORTNESS OF BREATH (Patient taking differently: Inhale 2 puffs into the lungs every 6 (six) hours as needed for wheezing or shortness of breath.) 20.1 g 1   Sodium Sulfate-Mag Sulfate-KCl (SUTAB) [phone] MG TABS Take as directed 24 tablet 0   No current facility-administered medications for this visit.    Allergies as of 07/06/2020 - Review Complete 07/06/2020  Allergen Reaction Noted   Asa [aspirin] Anaphylaxis, Hives, and Swelling 08/16/2011    Family History  Problem Relation Age of Onset   Heart disease Mother    Hypertension Mother    Diabetes Mother    Hypertension Father    Heart attack Maternal Uncle    COPD Paternal  Grandfather    Heart attack Maternal Uncle    Colon cancer Neg Hx     Social History   Socioeconomic History   Marital status: Divorced    Spouse name: Not on file   Number of children: Not on file   Years of education: Not on file   Highest education level: Not on file  Occupational History   Not on file  Tobacco Use   Smoking status: Every Day    Packs/day: 0.50    Years: 27.00    Pack years: 13.50    Types: Cigarettes   Smokeless tobacco: Never  Vaping Use   Vaping Use: Never used  Substance and Sexual Activity   Alcohol use: Not Currently   Drug use: Yes    Types: Marijuana   Sexual activity: Yes    Birth control/protection: Surgical  Other Topics Concern   Not on file  Social History Narrative   Not on file   Social Determinants of Health   Financial Resource Strain: Not on file  Food Insecurity: Not on file  Transportation Needs: Not on file  Physical Activity: Not on file  Stress: Not on file  Social Connections: Not on file    Subjective: Review of Systems  Constitutional:  Negative for chills and fever.  HENT:  Negative for congestion and hearing loss.   Eyes:  Negative for blurred vision and double vision.  Respiratory:  Negative for cough and shortness of breath.   Cardiovascular:  Negative for chest pain and palpitations.  Gastrointestinal:  Positive for heartburn. Negative for abdominal pain, blood in stool, constipation, diarrhea, melena and vomiting.       Dysphagia  Genitourinary:  Negative for dysuria and urgency.  Musculoskeletal:  Negative for joint pain and myalgias.  Skin:  Negative for itching and rash.  Neurological:  Negative for dizziness and headaches.  Psychiatric/Behavioral:  Negative for depression. The patient is not nervous/anxious.     Objective: BP 108/80   Pulse 79   Temp (!) 97.5 F (36.4 C) (Temporal)   Ht 4\' 11"  (1.499 m)   Wt 167 lb 6.4 oz (75.9 kg)   BMI 33.81 kg/m  Physical Exam Constitutional:       Appearance: Normal appearance.  HENT:     Head: Normocephalic and atraumatic.  Eyes:     Extraocular Movements: Extraocular movements intact.     Conjunctiva/sclera: Conjunctivae normal.  Cardiovascular:     Rate and Rhythm: Normal rate and regular rhythm.  Pulmonary:     Effort: Pulmonary effort is normal.     Comments: Inspiratory and expiratory wheezing bilaterally Abdominal:     General: Bowel sounds are normal.  Palpations: Abdomen is soft.  Musculoskeletal:        General: No swelling. Normal range of motion.     Cervical back: Normal range of motion and neck supple.  Skin:    General: Skin is warm and dry.     Coloration: Skin is not jaundiced.  Neurological:     General: No focal deficit present.     Mental Status: She is alert and oriented to person, place, and time.  Psychiatric:        Mood and Affect: Mood normal.        Behavior: Behavior normal.     Assessment: *GERD *Dysphagia *Positive fit test  Plan: Will schedule for EGD with possible dilation to evaluate for peptic ulcer disease, esophagitis, gastritis, H. Pylori, duodenitis, or other. Will also evaluate for esophageal stricture, Schatzki's ring, esophageal web or other.   At the same time we will perform colonoscopy due to positive FIT testing.  The risks including infection, bleed, or perforation as well as benefits, limitations, alternatives and imponderables have been reviewed with the patient. Potential for esophageal dilation, biopsy, etc. have also been reviewed.  Questions have been answered. All parties agreeable.  Continue on omeprazole 40 mg twice daily.  We will print off home practices to decrease reflux symptoms.  Further recommendations to follow.  07/07/2020 10:22 AM   Disclaimer: This note was dictated with voice recognition software. Similar sounding words can inadvertently be transcribed and may not be corrected upon review.

## 2020-07-21 NOTE — Patient Instructions (Addendum)
Pamela Livingston  07/21/2020     @PREFPERIOPPHARMACY @   Your procedure is scheduled on 07/26/20.  Report to [facility] at 1000 A.M.  Call this number if you have problems the morning of surgery:  [phone]   Remember:  Follow your diet & prep instructions provided to you from office.   Do not eat or drink after midnight.    Clear liquids allowed are:                    Water, Juice (non-citric and without pulp - diabetics please choose diet or no sugar options), Carbonated beverages - (diabetics please choose diet or no sugar options), Clear Tea, Black Coffee only (no creamer, milk or cream including half and half), Plain Jell-O only (diabetics please choose diet or no sugar options), Gatorade (diabetics please choose diet or no sugar options), and Plain Popsicles only    Take these medicines the morning of surgery with A SIP OF WATER flexeril, linzess, claritin, prilosec. Use your albuterol inhlaer & advair.    Do not wear jewelry, make-up or nail polish.  Do not wear lotions, powders, or perfumes, or deodorant.  Do not shave 48 hours prior to surgery.  Men may shave face and neck.  Do not bring valuables to the hospital.  [facility] is not responsible for any belongings or valuables.  Contacts, dentures or bridgework may not be worn into surgery.  Leave your suitcase in the car.  After surgery it may be brought to your room.  For patients admitted to the hospital, discharge time will be determined by your treatment team.  Patients discharged the day of surgery will not be allowed to drive home.   Name and phone number of your driver:   family Special instructions: Follow your diet & prep instructions provided to you from office.  Please read over the following fact sheets that you were given. Anesthesia Post-op Instructions and Care and Recovery After Surgery      [email or web address]  Esophageal  Dilatation Esophageal dilatation, also called esophageal dilation, is a procedure to widen or open a blocked or narrowed part of the esophagus. The esophagus is the part of the body that moves food and liquid from the mouth to the stomach. You may need this procedure if: You have a buildup of scar tissue in your esophagus that makes it difficult, painful, or impossible to swallow. This can be caused by gastroesophageal reflux disease (GERD). You have cancer of the esophagus. There is a problem with how food moves through your esophagus. In some cases, you may need this procedure repeated at a later time to dilatethe esophagus gradually. Tell a health care provider about: Any allergies you have. All medicines you are taking, including vitamins, herbs, eye drops, creams, and over-the-counter medicines. Any problems you or family members have had with anesthetic medicines. Any blood disorders you have. Any surgeries you have had. Any medical conditions you have. Any antibiotic medicines you are required to take before dental procedures. Whether you are pregnant or may be pregnant. What are the risks? Generally, this is a safe procedure. However, problems may occur, including: Bleeding due to a tear in the lining of the esophagus. A hole, or perforation, in the esophagus. What happens before the procedure? Ask your health care provider about: Changing or stopping your regular medicines. This is especially important if you are taking diabetes medicines or blood thinners. Taking medicines such as aspirin and ibuprofen. These medicines  can thin your blood. Do not take these medicines unless your health care provider tells you to take them. Taking over-the-counter medicines, vitamins, herbs, and supplements. Follow instructions from your health care provider about eating or drinking restrictions. Plan to have a responsible adult take you home from the hospital or clinic. Plan to have a responsible  adult care for you for the time you are told after you leave the hospital or clinic. This is important. What happens during the procedure? You may be given a medicine to help you relax (sedative). A numbing medicine may be sprayed into the back of your throat, or you may gargle the medicine. Your health care provider may perform the dilatation using various surgical instruments, such as: Simple dilators. This instrument is carefully placed in the esophagus to stretch it. Guided wire bougies. This involves using an endoscope to insert a wire into the esophagus. A dilator is passed over this wire to enlarge the esophagus. Then the wire is removed. Balloon dilators. An endoscope with a small balloon is inserted into the esophagus. The balloon is inflated to stretch the esophagus and open it up. The procedure may vary among health care providers and hospitals. What can I expect after the procedure? Your blood pressure, heart rate, breathing rate, and blood oxygen level will be monitored until you leave the hospital or clinic. Your throat may feel slightly sore and numb. This will get better over time. You will not be allowed to eat or drink until your throat is no longer numb. When you are able to drink, urinate, and sit on the edge of the bed without nausea or dizziness, you may be able to return home. Follow these instructions at home: Take over-the-counter and prescription medicines only as told by your health care provider. If you were given a sedative during the procedure, it can affect you for several hours. Do not drive or operate machinery until your health care provider says that it is safe. Plan to have a responsible adult care for you for the time you are told. This is important. Follow instructions from your health care provider about any eating or drinking restrictions. Do not use any products that contain nicotine or tobacco, such as cigarettes, e-cigarettes, and chewing tobacco. If you  need help quitting, ask your health care provider. Keep all follow-up visits. This is important. Contact a health care provider if: You have a fever. You have pain that is not relieved by medicine. Get help right away if: You have chest pain. You have trouble breathing. You have trouble swallowing. You vomit blood. You have black, tarry, or bloody stools. These symptoms may represent a serious problem that is an emergency. Do not wait to see if the symptoms will go away. Get medical help right away. Call your local emergency services (911 in the [place] Do not drive yourself to the hospital. Summary Esophageal dilatation, also called esophageal dilation, is a procedure to widen or open a blocked or narrowed part of the esophagus. Plan to have a responsible adult take you home from the hospital or clinic. For this procedure, a numbing medicine may be sprayed into the back of your throat, or you may gargle the medicine. Do not drive or operate machinery until your health care provider says that it is safe. This information is not intended to replace advice given to you by your health care provider. Make sure you discuss any questions you have with your healthcare provider. Document Revised: 05/27/2019 Document Reviewed:  05/27/2019 Elsevier Patient Education  2022 [place] Endoscopy, Adult Upper endoscopy is a procedure to look inside the upper GI (gastrointestinal) tract. The upper GI tract is made up of: The part of the body that moves food from your mouth to your stomach (esophagus). The stomach. The first part of your small intestine (duodenum). This procedure is also called esophagogastroduodenoscopy (EGD) or gastroscopy. In this procedure, your health care provider passes a thin, flexible tube (endoscope) through your mouth and down your esophagus into your stomach. A small camera is attached to the end of the tube. Images from the camera appear on a monitor in the exam room.  During this procedure, your health care provider may also remove a small piece of tissue to be sent to a lab and examined under a microscope (biopsy). Your health care provider may do an upper endoscopy to diagnose cancers of the upper GI tract. You may also have this procedure to find the cause of other conditions, such as: Stomach pain. Heartburn. Pain or problems when swallowing. Nausea and vomiting. Stomach bleeding. Stomach ulcers. Tell a health care provider about: Any allergies you have. All medicines you are taking, including vitamins, herbs, eye drops, creams, and over-the-counter medicines. Any problems you or family members have had with anesthetic medicines. Any blood disorders you have. Any surgeries you have had. Any medical conditions you have. Whether you are pregnant or may be pregnant. What are the risks? Generally, this is a safe procedure. However, problems may occur, including: Infection. Bleeding. Allergic reactions to medicines. A tear or hole (perforation) in the esophagus, stomach, or duodenum. What happens before the procedure? Staying hydrated Follow instructions from your health care provider about hydration, which may include: Up to 2 hours before the procedure - you may continue to drink clear liquids, such as water, clear fruit juice, black coffee, and plain tea.  Eating and drinking restrictions Follow instructions from your health care provider about eating and drinking, which may include: 8 hours before the procedure - stop eating heavy meals or foods, such as meat, fried foods, or fatty foods. 6 hours before the procedure - stop eating light meals or foods, such as toast or cereal. 6 hours before the procedure - stop drinking milk or drinks that contain milk. 2 hours before the procedure - stop drinking clear liquids. Medicines Ask your health care provider about: Changing or stopping your regular medicines. This is especially important if you are  taking diabetes medicines or blood thinners. Taking medicines such as aspirin and ibuprofen. These medicines can thin your blood. Do not take these medicines unless your health care provider tells you to take them. Taking over-the-counter medicines, vitamins, herbs, and supplements. General instructions Plan to have someone take you home from the hospital or clinic. If you will be going home right after the procedure, plan to have someone with you for 24 hours. Ask your health care provider what steps will be taken to help prevent infection. What happens during the procedure?  An IV will be inserted into one of your veins. You may be given one or more of the following: A medicine to help you relax (sedative). A medicine to numb the throat (local anesthetic). You will lie on your left side on an exam table. Your health care provider will pass the endoscope through your mouth and down your esophagus. Your health care provider will use the scope to check the inside of your esophagus, stomach, and duodenum. Biopsies may be taken.  The endoscope will be removed. The procedure may vary among health care providers and hospitals. What happens after the procedure? Your blood pressure, heart rate, breathing rate, and blood oxygen level will be monitored until you leave the hospital or clinic. Do not drive for 24 hours if you were given a sedative during your procedure. When your throat is no longer numb, you may be given some fluids to drink. It is up to you to get the results of your procedure. Ask your health care provider, or the department that is doing the procedure, when your results will be ready. Summary Upper endoscopy is a procedure to look inside the upper GI tract. During the procedure, an IV will be inserted into one of your veins. You may be given a medicine to help you relax. A medicine will be used to numb your throat. The endoscope will be passed through your mouth and down your  esophagus. This information is not intended to replace advice given to you by your health care provider. Make sure you discuss any questions you have with your healthcare provider. Document Revised: 07/03/2017 Document Reviewed: 06/10/2017 Elsevier Patient Education  2022 [place]. Colonoscopy, Adult A colonoscopy is a procedure to look at the entire large intestine. This procedure is done using a long, thin, flexible tube that has a camera on theend. You may have a colonoscopy: As a part of normal colorectal screening. If you have certain symptoms, such as: A low number of red blood cells in your blood (anemia). Diarrhea that does not go away. Pain in your abdomen. Blood in your stool. A colonoscopy can help screen for and diagnose medical problems, including: Tumors. Extra tissue that grows where mucus forms (polyps). Inflammation. Areas of bleeding. Tell your health care provider about: Any allergies you have. All medicines you are taking, including vitamins, herbs, eye drops, creams, and over-the-counter medicines. Any problems you or family members have had with anesthetic medicines. Any blood disorders you have. Any surgeries you have had. Any medical conditions you have. Any problems you have had with having bowel movements. Whether you are pregnant or may be pregnant. What are the risks? Generally, this is a safe procedure. However, problems may occur, including: Bleeding. Damage to your intestine. Allergic reactions to medicines given during the procedure. Infection. This is rare. What happens before the procedure? Eating and drinking restrictions Follow instructions from your health care provider about eating or drinking restrictions, which may include: A few days before the procedure: Follow a low-fiber diet. Avoid nuts, seeds, dried fruit, raw fruits, and vegetables. 1-3 days before the procedure: Eat only gelatin dessert or ice pops. Drink only clear  liquids, such as water, clear juice, clear broth or bouillon, black coffee or tea, or clear soft drinks or sports drinks. Avoid liquids that contain red or purple dye. The day of the procedure: Do not eat solid foods. You may continue to drink clear liquids until up to 2 hours before the procedure. Do not eat or drink anything starting 2 hours before the procedure, or within the time period that your health care provider recommends. Bowel prep If you were prescribed a bowel prep to take by mouth (orally) to clean out your colon: Take it as told by your health care provider. Starting the day before your procedure, you will need to drink a large amount of liquid medicine. The liquid will cause you to have many bowel movements of loose stool until your stool becomes almost clear or light  green. If your skin or the opening between the buttocks (anus) gets irritated from diarrhea, you may relieve the irritation using: Wipes with medicine in them, such as adult wet wipes with aloe and vitamin E. A product to soothe skin, such as petroleum jelly. If you vomit while drinking the bowel prep: Take a break for up to 60 minutes. Begin the bowel prep again. Call your health care provider if you keep vomiting or you cannot take the bowel prep without vomiting. To clean out your colon, you may also be given: Laxative medicines. These help you have a bowel movement. Instructions for enema use. An enema is liquid medicine injected into your rectum. Medicines Ask your health care provider about: Changing or stopping your regular medicines or supplements. This is especially important if you are taking iron supplements, diabetes medicines, or blood thinners. Taking medicines such as aspirin and ibuprofen. These medicines can thin your blood. Do not take these medicines unless your health care provider tells you to take them. Taking over-the-counter medicines, vitamins, herbs, and supplements. General  instructions Ask your health care provider what steps will be taken to help prevent infection. These may include washing skin with a germ-killing soap. Plan to have someone take you home from the hospital or clinic. What happens during the procedure?  An IV will be inserted into one of your veins. You may be given one or more of the following: A medicine to help you relax (sedative). A medicine to numb the area (local anesthetic). A medicine to make you fall asleep (general anesthetic). This is rarely needed. You will lie on your side with your knees bent. The tube will: Have oil or gel put on it (be lubricated). Be inserted into your anus. Be gently eased through all parts of your large intestine. Air will be sent into your colon to keep it open. This may cause some pressure or cramping. Images will be taken with the camera and will appear on a screen. A small tissue sample may be removed to be looked at under a microscope (biopsy). The tissue may be sent to a lab for testing if any signs of problems are found. If small polyps are found, they may be removed and checked for cancer cells. When the procedure is finished, the tube will be removed. The procedure may vary among health care providers and hospitals. What happens after the procedure? Your blood pressure, heart rate, breathing rate, and blood oxygen level will be monitored until you leave the hospital or clinic. You may have a small amount of blood in your stool. You may pass gas and have mild cramping or bloating in your abdomen. This is caused by the air that was used to open your colon during the exam. Do not drive for 24 hours after the procedure. It is up to you to get the results of your procedure. Ask your health care provider, or the department that is doing the procedure, when your results will be ready. Summary A colonoscopy is a procedure to look at the entire large intestine. Follow instructions from your health care  provider about eating and drinking before the procedure. If you were prescribed an oral bowel prep to clean out your colon, take it as told by your health care provider. During the colonoscopy, a flexible tube with a camera on its end is inserted into the anus and then passed into the other parts of the large intestine. This information is not intended to replace advice given  to you by your health care provider. Make sure you discuss any questions you have with your healthcare provider. Document Revised: 08/01/2018 Document Reviewed: 08/01/2018 Elsevier Patient Education  [place] POST-ANESTHESIA  IMMEDIATELY FOLLOWING SURGERY:  Do not drive or operate machinery for the first twenty four hours after surgery.  Do not make any important decisions for twenty four hours after surgery or while taking narcotic pain medications or sedatives.  If you develop intractable nausea and vomiting or a severe headache please notify your doctor immediately.  FOLLOW-UP:  Please make an appointment with your surgeon as instructed. You do not need to follow up with anesthesia unless specifically instructed to do so.  WOUND CARE INSTRUCTIONS (if applicable):  Keep a dry clean dressing on the anesthesia/puncture wound site if there is drainage.  Once the wound has quit draining you may leave it open to air.  Generally you should leave the bandage intact for twenty four hours unless there is drainage.  If the epidural site drains for more than 36-48 hours please call the anesthesia department.  QUESTIONS?:  Please feel free to call your physician or the hospital operator if you have any questions, and they will be happy to assist you.        Monitored Anesthesia Care, Care After This sheet gives you information about how to care for yourself after your procedure. Your health care provider may also give you more specific instructions. If you have problems or questions, contact your  health careprovider. What can I expect after the procedure? After the procedure, it is common to have: Tiredness. Forgetfulness about what happened after the procedure. Impaired judgment for important decisions. Nausea or vomiting. Some difficulty with balance. Follow these instructions at home: For the time period you were told by your health care provider:     Rest as needed. Do not participate in activities where you could fall or become injured. Do not drive or use machinery. Do not drink alcohol. Do not take sleeping pills or medicines that cause drowsiness. Do not make important decisions or sign legal documents. Do not take care of children on your own. Eating and drinking Follow the diet that is recommended by your health care provider. Drink enough fluid to keep your urine pale yellow. If you vomit: Drink water, juice, or soup when you can drink without vomiting. Make sure you have little or no nausea before eating solid foods. General instructions Have a responsible adult stay with you for the time you are told. It is important to have someone help care for you until you are awake and alert. Take over-the-counter and prescription medicines only as told by your health care provider. If you have sleep apnea, surgery and certain medicines can increase your risk for breathing problems. Follow instructions from your health care provider about wearing your sleep device: Anytime you are sleeping, including during daytime naps. While taking prescription pain medicines, sleeping medicines, or medicines that make you drowsy. Avoid smoking. Keep all follow-up visits as told by your health care provider. This is important. Contact a health care provider if: You keep feeling nauseous or you keep vomiting. You feel light-headed. You are still sleepy or having trouble with balance after 24 hours. You develop a rash. You have a fever. You have redness or swelling around the IV  site. Get help right away if: You have trouble breathing. You have new-onset confusion at home. Summary For several hours after your procedure, you may feel  tired. You may also be forgetful and have poor judgment. Have a responsible adult stay with you for the time you are told. It is important to have someone help care for you until you are awake and alert. Rest as told. Do not drive or operate machinery. Do not drink alcohol or take sleeping pills. Get help right away if you have trouble breathing, or if you suddenly become confused. This information is not intended to replace advice given to you by your health care provider. Make sure you discuss any questions you have with your healthcare provider. Document Revised: 09/24/2019 Document Reviewed: 12/11/2018 Elsevier Patient Education  2022 [employer].

## 2020-07-22 ENCOUNTER — Other Ambulatory Visit: Payer: Self-pay

## 2020-07-22 ENCOUNTER — Encounter (HOSPITAL_COMMUNITY)
Admission: RE | Admit: 2020-07-22 | Discharge: 2020-07-22 | Disposition: A | Discharge status: Home / Self Care | Payer: No Typology Code available for payment source | Source: Ambulatory Visit | Attending: Internal Medicine | Admitting: Internal Medicine

## 2020-07-22 DIAGNOSIS — Z01810 Encounter for preprocedural cardiovascular examination: Secondary | ICD-10-CM | POA: Insufficient documentation

## 2020-07-26 ENCOUNTER — Encounter (HOSPITAL_COMMUNITY)
Admission: RE | Disposition: A | Discharge status: Home / Self Care | Payer: Self-pay | Source: Home / Self Care | Attending: Internal Medicine

## 2020-07-26 ENCOUNTER — Other Ambulatory Visit: Payer: Self-pay

## 2020-07-26 ENCOUNTER — Encounter (HOSPITAL_COMMUNITY): Payer: Self-pay

## 2020-07-26 ENCOUNTER — Ambulatory Visit (HOSPITAL_COMMUNITY): Payer: No Typology Code available for payment source | Admitting: Anesthesiology

## 2020-07-26 ENCOUNTER — Ambulatory Visit (HOSPITAL_COMMUNITY)
Admission: RE | Admit: 2020-07-26 | Discharge: 2020-07-26 | Disposition: A | Discharge status: Home / Self Care | Payer: No Typology Code available for payment source | Attending: Internal Medicine | Admitting: Internal Medicine

## 2020-07-26 DIAGNOSIS — D124 Benign neoplasm of descending colon: Secondary | ICD-10-CM | POA: Insufficient documentation

## 2020-07-26 DIAGNOSIS — K635 Polyp of colon: Secondary | ICD-10-CM

## 2020-07-26 DIAGNOSIS — K625 Hemorrhage of anus and rectum: Secondary | ICD-10-CM | POA: Insufficient documentation

## 2020-07-26 DIAGNOSIS — K449 Diaphragmatic hernia without obstruction or gangrene: Secondary | ICD-10-CM | POA: Insufficient documentation

## 2020-07-26 DIAGNOSIS — K648 Other hemorrhoids: Secondary | ICD-10-CM | POA: Insufficient documentation

## 2020-07-26 DIAGNOSIS — Z79899 Other long term (current) drug therapy: Secondary | ICD-10-CM | POA: Insufficient documentation

## 2020-07-26 DIAGNOSIS — K3189 Other diseases of stomach and duodenum: Secondary | ICD-10-CM | POA: Insufficient documentation

## 2020-07-26 DIAGNOSIS — K222 Esophageal obstruction: Secondary | ICD-10-CM | POA: Insufficient documentation

## 2020-07-26 DIAGNOSIS — K219 Gastro-esophageal reflux disease without esophagitis: Secondary | ICD-10-CM | POA: Insufficient documentation

## 2020-07-26 DIAGNOSIS — F17210 Nicotine dependence, cigarettes, uncomplicated: Secondary | ICD-10-CM | POA: Insufficient documentation

## 2020-07-26 DIAGNOSIS — R1310 Dysphagia, unspecified: Secondary | ICD-10-CM | POA: Insufficient documentation

## 2020-07-26 DIAGNOSIS — R12 Heartburn: Secondary | ICD-10-CM | POA: Insufficient documentation

## 2020-07-26 DIAGNOSIS — Z886 Allergy status to analgesic agent status: Secondary | ICD-10-CM | POA: Insufficient documentation

## 2020-07-26 DIAGNOSIS — Z7951 Long term (current) use of inhaled steroids: Secondary | ICD-10-CM | POA: Insufficient documentation

## 2020-07-26 DIAGNOSIS — R195 Other fecal abnormalities: Secondary | ICD-10-CM | POA: Insufficient documentation

## 2020-07-26 HISTORY — PX: ESOPHAGOGASTRODUODENOSCOPY (EGD) WITH PROPOFOL: SHX5813

## 2020-07-26 HISTORY — PX: BALLOON DILATION: SHX5330

## 2020-07-26 HISTORY — PX: COLONOSCOPY WITH PROPOFOL: SHX5780

## 2020-07-26 HISTORY — PX: BIOPSY: SHX5522

## 2020-07-26 SURGERY — COLONOSCOPY WITH PROPOFOL
Anesthesia: General

## 2020-07-26 MED ORDER — PROPOFOL 500 MG/50ML IV EMUL
INTRAVENOUS | Status: DC | PRN
  Administered 2020-07-26: 150 ug/kg/min via INTRAVENOUS

## 2020-07-26 MED ORDER — PROPOFOL 10 MG/ML IV BOLUS
INTRAVENOUS | Status: DC | PRN
  Administered 2020-07-26: 100 mg via INTRAVENOUS
  Administered 2020-07-26 (×2): 30 mg via INTRAVENOUS

## 2020-07-26 MED ORDER — STERILE WATER FOR IRRIGATION IR SOLN
Status: DC | PRN
  Administered 2020-07-26: 1.5 mL

## 2020-07-26 MED ORDER — LIDOCAINE HCL (CARDIAC) PF 100 MG/5ML IV SOSY
PREFILLED_SYRINGE | INTRAVENOUS | Status: DC | PRN
  Administered 2020-07-26: 50 mg via INTRAVENOUS

## 2020-07-26 MED ORDER — LACTATED RINGERS IV SOLN: INTRAVENOUS | Status: DC

## 2020-07-26 NOTE — Anesthesia Postprocedure Evaluation (Signed)
Anesthesia Post Note  Patient: Pamela Livingston  Procedure(s) Performed: COLONOSCOPY WITH PROPOFOL ESOPHAGOGASTRODUODENOSCOPY (EGD) WITH PROPOFOL BALLOON DILATION BIOPSY  Patient location during evaluation: Phase II Anesthesia Type: General Level of consciousness: awake and alert and oriented Pain management: pain level controlled Vital Signs Assessment: post-procedure vital signs reviewed and stable Respiratory status: spontaneous breathing and respiratory function stable Cardiovascular status: blood pressure returned to baseline and stable Postop Assessment: no apparent nausea or vomiting Anesthetic complications: no   No notable events documented.   Last Vitals:  Vitals:   07/26/20 1015 07/26/20 1128  BP: 111/79 100/67  Pulse: 88 80  Resp: 11 18  Temp: 36.6 C 36.5 C  SpO2: 100% 100%    Last Pain:  Vitals:   07/26/20 1128  TempSrc: Oral  PainSc: 0-No pain                 Kailand Seda C Allahna Husband

## 2020-07-26 NOTE — Op Note (Signed)
[facility] Patient Name: Pamela Livingston Procedure Date: 07/26/2020 11:07 AM MRN: [id number] Date of Birth: 04/25/1965 Attending MD: Elon Alas. Abbey Chatters DO CSN: [id number] Age: 55 Admit Type: Outpatient Procedure:                Colonoscopy Indications:              Positive fecal immunochemical test Providers:                Elon Alas. Abbey Chatters, DO, Charlsie Quest. Theda Sers RN, RN,                            Aram Candela Referring MD:              Medicines:                See the Anesthesia note for documentation of the                            administered medications Complications:            No immediate complications. Estimated Blood Loss:     Estimated blood loss was minimal. Procedure:                Pre-Anesthesia Assessment:                           - The anesthesia plan was to use monitored                            anesthesia care (MAC).                           After obtaining informed consent, the colonoscope                            was passed under direct vision. Throughout the                            procedure, the patient's blood pressure, pulse, and                            oxygen saturations were monitored continuously. The                            [id number]-HQ190L([id number]) was introduced through the anus                            and advanced to the the cecum, identified by                            appendiceal orifice and ileocecal valve. The                            colonoscopy was performed without difficulty. The                            patient tolerated the procedure well. The quality  of the bowel preparation was evaluated using the                            BBPS [facility] Bowel Preparation Scale) with scores                            of: Right Colon = 2 (minor amount of residual                            staining, small fragments of stool and/or opaque                            liquid, but mucosa seen well), Transverse  Colon = 3                            (entire mucosa seen well with no residual staining,                            small fragments of stool or opaque liquid) and Left                            Colon = 3 (entire mucosa seen well with no residual                            staining, small fragments of stool or opaque                            liquid). The total BBPS score equals 8. The quality                            of the bowel preparation was good. Scope In: 11:09:19 AM Scope Out: 11:18:26 AM Scope Withdrawal Time: 0 hours 6 minutes 44 seconds  Total Procedure Duration: 0 hours 9 minutes 7 seconds  Findings:      The perianal and digital rectal examinations were normal.      Non-bleeding internal hemorrhoids were found during endoscopy.      A 4 mm polyp was found in the descending colon. The polyp was sessile.       The polyp was removed with a cold snare. Resection and retrieval were       complete.      The exam was otherwise without abnormality. Impression:               - Non-bleeding internal hemorrhoids.                           - One 4 mm polyp in the descending colon, removed                            with a cold snare. Resected and retrieved.                           - The examination was otherwise normal. Moderate Sedation:      Per Anesthesia Care  Recommendation:           - Patient has a contact number available for                            emergencies. The signs and symptoms of potential                            delayed complications were discussed with the                            patient. Return to normal activities tomorrow.                            Written discharge instructions were provided to the                            patient.                           - Resume previous diet.                           - Continue present medications.                           - Await pathology results.                           - Repeat colonoscopy in 5  years for surveillance.                           - Return to GI clinic in 3 months. Procedure Code(s):        --- Professional ---                           [phone], Colonoscopy, flexible; with removal of                            tumor(s), polyp(s), or other lesion(s) by snare                            technique Diagnosis Code(s):        --- Professional ---                           K63.5, Polyp of colon                           K64.8, Other hemorrhoids                           R19.5, Other fecal abnormalities CPT copyright 2019 American Medical Association. All rights reserved. The codes documented in this report are preliminary and upon coder review may  be revised to meet current compliance requirements. Elon Alas. Abbey Chatters, DO [place] Abbey Chatters, DO 07/26/2020 11:21:06 AM This report has been signed electronically. Number of Addenda: 0

## 2020-07-26 NOTE — Discharge Instructions (Signed)
EGD Discharge instructions Please read the instructions outlined below and refer to this sheet in the next few weeks. These discharge instructions provide you with general information on caring for yourself after you leave the hospital. Your doctor may also give you specific instructions. While your treatment has been planned according to the most current medical practices available, unavoidable complications occasionally occur. If you have any problems or questions after discharge, please call your doctor. ACTIVITY You may resume your regular activity but move at a slower pace for the next 24 hours.  Take frequent rest periods for the next 24 hours.  Walking will help expel (get rid of) the air and reduce the bloated feeling in your abdomen.  No driving for 24 hours (because of the anesthesia (medicine) used during the test).  You may shower.  Do not sign any important legal documents or operate any machinery for 24 hours (because of the anesthesia used during the test).  NUTRITION Drink plenty of fluids.  You may resume your normal diet.  Begin with a light meal and progress to your normal diet.  Avoid alcoholic beverages for 24 hours or as instructed by your caregiver.  MEDICATIONS You may resume your normal medications unless your caregiver tells you otherwise.  WHAT YOU CAN EXPECT TODAY You may experience abdominal discomfort such as a feeling of fullness or "gas" pains.  FOLLOW-UP Your doctor will discuss the results of your test with you.  SEEK IMMEDIATE MEDICAL ATTENTION IF ANY OF THE FOLLOWING OCCUR: Excessive nausea (feeling sick to your stomach) and/or vomiting.  Severe abdominal pain and distention (swelling).  Trouble swallowing.  Temperature over 101 F (37.8 C).  Rectal bleeding or vomiting of blood.    Colonoscopy Discharge Instructions  Read the instructions outlined below and refer to this sheet in the next few weeks. These discharge instructions provide you with  general information on caring for yourself after you leave the hospital. Your doctor may also give you specific instructions. While your treatment has been planned according to the most current medical practices available, unavoidable complications occasionally occur.   ACTIVITY You may resume your regular activity, but move at a slower pace for the next 24 hours.  Take frequent rest periods for the next 24 hours.  Walking will help get rid of the air and reduce the bloated feeling in your belly (abdomen).  No driving for 24 hours (because of the medicine (anesthesia) used during the test).   Do not sign any important legal documents or operate any machinery for 24 hours (because of the anesthesia used during the test).  NUTRITION Drink plenty of fluids.  You may resume your normal diet as instructed by your doctor.  Begin with a light meal and progress to your normal diet. Heavy or fried foods are harder to digest and may make you feel sick to your stomach (nauseated).  Avoid alcoholic beverages for 24 hours or as instructed.  MEDICATIONS You may resume your normal medications unless your doctor tells you otherwise.  WHAT YOU CAN EXPECT TODAY Some feelings of bloating in the abdomen.  Passage of more gas than usual.  Spotting of blood in your stool or on the toilet paper.  IF YOU HAD POLYPS REMOVED DURING THE COLONOSCOPY: No aspirin products for 7 days or as instructed.  No alcohol for 7 days or as instructed.  Eat a soft diet for the next 24 hours.  FINDING OUT THE RESULTS OF YOUR TEST Not all test results are available  during your visit. If your test results are not back during the visit, make an appointment with your caregiver to find out the results. Do not assume everything is normal if you have not heard from your caregiver or the medical facility. It is important for you to follow up on all of your test results.  SEEK IMMEDIATE MEDICAL ATTENTION IF: You have more than a spotting of  blood in your stool.  Your belly is swollen (abdominal distention).  You are nauseated or vomiting.  You have a temperature over 101.  You have abdominal pain or discomfort that is severe or gets worse throughout the day.   Your EGD revealed a mild amount inflammation your stomach.  I took biopsies of this to rule infection a bacteria called H. pylori.  Also took biopsies of your esophagus to rule out underlying conditions that can cause difficulty swallowing.  You did have a slight narrowing of your esophagus so I dilated this with the balloon.  Hopefully this helps with your swallowing.  Continue on omeprazole.  Your colonoscopy revealed 1 polyp(s) which I removed successfully. Await pathology results, my office will contact you. I recommend repeating colonoscopy in 5 years for surveillance purposes.   Follow up with in 3 months.    I hope you have a great rest of your week!  Pamela Livingston. Pamela Livingston, D.O. Gastroenterology and Hepatology [facility]

## 2020-07-26 NOTE — Op Note (Signed)
[facility] Patient Name: Pamela Livingston Procedure Date: 07/26/2020 10:45 AM MRN: [id number] Date of Birth: [birth year] Attending MD: Elon Alas. Abbey Chatters DO CSN: [id number] Age: 55 Admit Type: Outpatient Procedure:                Upper GI endoscopy Indications:              Dysphagia, Heartburn Providers:                Elon Alas. Abbey Chatters, DO, Charlsie Quest. Theda Sers RN, Therapist, sports,                            Charlsie Quest. Theda Sers RN, RN, Aram Candela Referring MD:              Medicines:                See the Anesthesia note for documentation of the                            administered medications Complications:            No immediate complications. Estimated Blood Loss:     Estimated blood loss was minimal. Procedure:                Pre-Anesthesia Assessment:                           - The anesthesia plan was to use monitored                            anesthesia care (MAC).                           After obtaining informed consent, the endoscope was                            passed under direct vision. Throughout the                            procedure, the patient's blood pressure, pulse, and                            oxygen saturations were monitored continuously. The                            [id number]-H190 ([id number]) scope was introduced through the                            mouth, and advanced to the second part of duodenum.                            The upper GI endoscopy was accomplished without                            difficulty. The patient tolerated the procedure  well. Scope In: 10:59:19 AM Scope Out: 11:05:30 AM Total Procedure Duration: 0 hours 6 minutes 11 seconds  Findings:      A small hiatal hernia was present.      A mild Schatzki ring was found in the lower third of the esophagus. A       TTS dilator was passed through the scope. Dilation with an 18-19-20 mm       balloon dilator was performed to 20 mm. The dilation site was examined       and  showed moderate improvement in luminal narrowing.      Patchy mildly erythematous mucosa without bleeding was found in the       gastric body. Biopsies were taken with a cold forceps for Helicobacter       pylori testing.      The duodenal bulb, first portion of the duodenum and second portion of       the duodenum were normal.      Biopsies were taken with a cold forceps in the middle third of the       esophagus for histology. Impression:               - Small hiatal hernia.                           - Mild Schatzki ring. Dilated.                           - Erythematous mucosa in the gastric body. Biopsied.                           - Normal duodenal bulb, first portion of the                            duodenum and second portion of the duodenum.                           - Biopsies were taken with a cold forceps for                            histology in the middle third of the esophagus. Moderate Sedation:      Per Anesthesia Care Recommendation:           - Patient has a contact number available for                            emergencies. The signs and symptoms of potential                            delayed complications were discussed with the                            patient. Return to normal activities tomorrow.                            Written discharge instructions were provided to the  patient.                           - Resume previous diet.                           - Continue present medications.                           - Await pathology results.                           - Repeat upper endoscopy PRN for retreatment.                           - Use a proton pump inhibitor PO BID.                           - Return to GI clinic in 3 months. Procedure Code(s):        --- Professional ---                           [phone], Esophagogastroduodenoscopy, flexible,                            transoral; with transendoscopic balloon dilation of                             esophagus (less than 30 mm diameter)                           [id number], 59, Esophagogastroduodenoscopy, flexible,                            transoral; with biopsy, single or multiple Diagnosis Code(s):        --- Professional ---                           K44.9, Diaphragmatic hernia without obstruction or                            gangrene                           K22.2, Esophageal obstruction                           K31.89, Other diseases of stomach and duodenum                           R13.10, Dysphagia, unspecified                           R12, Heartburn CPT copyright 2019 American Medical Association. All rights reserved. The codes documented in this report are preliminary and upon coder review may  be revised to meet current compliance requirements. Elon Alas. Abbey Chatters, DO [place] Abbey Chatters, DO 07/26/2020 11:08:22 AM This report has been signed  electronically. Number of Addenda: 0

## 2020-07-26 NOTE — Interval H&P Note (Signed)
History and Physical Interval Note:  07/26/2020 10:46 AM  Pamela Livingston [name]  has presented today for surgery, with the diagnosis of dysphagia, gerd, positive FIT test.  The various methods of treatment have been discussed with the patient and family. After consideration of risks, benefits and other options for treatment, the patient has consented to  Procedure(s) with comments: COLONOSCOPY WITH PROPOFOL (N/A) - 12:00pm ESOPHAGOGASTRODUODENOSCOPY (EGD) WITH PROPOFOL (N/A) BALLOON DILATION (N/A) as a surgical intervention.  The patient's history has been reviewed, patient examined, no change in status, stable for surgery.  I have reviewed the patient's chart and labs.  Questions were answered to the patient's satisfaction.     Eloise Harman

## 2020-07-26 NOTE — Anesthesia Preprocedure Evaluation (Signed)
Anesthesia Evaluation  Patient identified by MRN, date of birth, ID band Patient awake    Reviewed: Allergy & Precautions, NPO status , Patient's Chart, lab work & pertinent test results  History of Anesthesia Complications Negative for: history of anesthetic complications  Airway Mallampati: II  TM Distance: >3 FB Neck ROM: Full    Dental  (+) Dental Advisory Given, Chipped, Missing   Pulmonary shortness of breath and with exertion, asthma , COPD, Current SmokerPatient did not abstain from smoking.,    Pulmonary exam normal breath sounds clear to auscultation       Cardiovascular Exercise Tolerance: Poor Normal cardiovascular exam Rhythm:Regular Rate:Normal  22-Jul-2020 08:54:21 [facility] ROUTINE RECORD Oct 10, 1965 (38 yr) Female Caucasian Room: Loc:905 Technician: Test EHM:CNOBS->JGG-EZ Vent. rate 83 BPM PR interval 126 ms QRS duration 76 ms QT/QTcB 386/453 ms P-R-T axes 41 41 56 Normal sinus rhythm Low voltage QRS Borderline ECG Confirmed by Eleonore Chiquito [phone]) on 07/22/2020 7:41:51 PM   Neuro/Psych PSYCHIATRIC DISORDERS    GI/Hepatic PUD, GERD  Medicated and Poorly Controlled,(+)     substance abuse  cocaine use and marijuana use,   Endo/Other  negative endocrine ROS  Renal/GU negative Renal ROS     Musculoskeletal negative musculoskeletal ROS (+)   Abdominal   Peds  Hematology negative hematology ROS (+)   Anesthesia Other Findings   Reproductive/Obstetrics negative OB ROS                            Anesthesia Physical Anesthesia Plan  ASA: 3  Anesthesia Plan: General   Post-op Pain Management:    Induction: Intravenous  PONV Risk Score and Plan: Propofol infusion  Airway Management Planned: Nasal Cannula and Natural Airway  Additional Equipment:   Intra-op Plan:   Post-operative Plan:   Informed Consent: I have reviewed the  patients History and Physical, chart, labs and discussed the procedure including the risks, benefits and alternatives for the proposed anesthesia with the patient or authorized representative who has indicated his/her understanding and acceptance.     Dental advisory given  Plan Discussed with: CRNA and Surgeon  Anesthesia Plan Comments:         Anesthesia Quick Evaluation

## 2020-07-26 NOTE — Transfer of Care (Signed)
Immediate Anesthesia Transfer of Care Note  Patient: Pamela Livingston  Procedure(s) Performed: COLONOSCOPY WITH PROPOFOL ESOPHAGOGASTRODUODENOSCOPY (EGD) WITH PROPOFOL BALLOON DILATION BIOPSY  Patient Location: Short Stay  Anesthesia Type:General  Level of Consciousness: awake and alert   Airway & Oxygen Therapy: Patient Spontanous Breathing  Post-op Assessment: Report given to RN and Post -op Vital signs reviewed and stable  Post vital signs: Reviewed and stable  Last Vitals:  Vitals Value Taken Time  BP    Temp    Pulse    Resp    SpO2      Last Pain:  Vitals:   07/26/20 1059  TempSrc:   PainSc: 0-No pain         Complications: No notable events documented.

## 2020-07-27 LAB — SURGICAL PATHOLOGY

## 2020-07-28 ENCOUNTER — Other Ambulatory Visit: Payer: Self-pay | Admitting: Physician Assistant

## 2020-08-02 ENCOUNTER — Encounter (HOSPITAL_COMMUNITY): Payer: Self-pay | Admitting: Internal Medicine

## 2020-08-11 ENCOUNTER — Encounter: Payer: Self-pay | Admitting: *Deleted

## 2020-08-15 ENCOUNTER — Ambulatory Visit: Payer: Self-pay | Admitting: Physician Assistant

## 2020-08-15 ENCOUNTER — Encounter: Payer: Self-pay | Admitting: Physician Assistant

## 2020-08-15 VITALS — BP 98/70 | HR 101 | Temp 98.5°F | Wt 169.0 lb

## 2020-08-15 DIAGNOSIS — M79605 Pain in left leg: Secondary | ICD-10-CM

## 2020-08-15 DIAGNOSIS — F17200 Nicotine dependence, unspecified, uncomplicated: Secondary | ICD-10-CM

## 2020-08-15 DIAGNOSIS — J449 Chronic obstructive pulmonary disease, unspecified: Secondary | ICD-10-CM

## 2020-08-15 DIAGNOSIS — I10 Essential (primary) hypertension: Secondary | ICD-10-CM

## 2020-08-15 DIAGNOSIS — R231 Pallor: Secondary | ICD-10-CM

## 2020-08-15 DIAGNOSIS — R262 Difficulty in walking, not elsewhere classified: Secondary | ICD-10-CM

## 2020-08-15 MED ORDER — GABAPENTIN 100 MG PO CAPS: 100.0000 mg | ORAL_CAPSULE | Freq: Two times a day (BID) | ORAL | 1 refills | Status: DC | PRN

## 2020-08-15 NOTE — Progress Notes (Signed)
BP 98/70   Pulse (!) 101   Temp 98.5 F (36.9 C)   Wt 169 lb (76.7 kg)   SpO2 96%   BMI 34.13 kg/m    Subjective:    Patient ID: Pamela Livingston, female    DOB: Sep 28, 1965, 55 y.o.   MRN: [id number]  HPI: Pamela Livingston is a 55 y.o. female presenting on 08/15/2020 for COPD   HPI  Pt had a negative covid 19 screening questionnaire.   Chief Complaint  Patient presents with   COPD   Hypertension   Hyperlipidemia   Leg Pain      In reference to her copd,she says her Breathing difficult due to the heat.  She is using nebulizer treatments which help.  She continues to smoke but says she has cut back.  In reference to her LLE pain, she says the pain is getting worse.  She uses ice and heat but neither help.  She says tylenol doesn't help.  She was referred to Vascular surgery did evaluation with no obvious vascular etiology.  They recommended autoimmue work up  Recent GI evaluation discussed.    Relevant past medical, surgical, family and social history reviewed and updated as indicated. Interim medical history since our last visit reviewed. Allergies and medications reviewed and updated.   Current Outpatient Medications:    acetaminophen (TYLENOL) 500 MG tablet, Take 1,500 mg by mouth every 6 (six) hours as needed for moderate pain., Disp: , Rfl:    albuterol (PROVENTIL) (2.5 MG/3ML) 0.083% nebulizer solution, INHALE 1 VIAL VIA NEBULIZER EVERY 6 HOURS AS NEEDED FOR WHEEZING OR SHORTNESS OF BREATH, Disp: 225 mL, Rfl: 0   atorvastatin (LIPITOR) 20 MG tablet, TAKE 1 Tablet BY MOUTH ONCE EVERY DAY (Patient taking differently: Take 20 mg by mouth every evening.), Disp: 90 tablet, Rfl: 1   diphenhydrAMINE (BENADRYL) 25 MG tablet, Take 50 mg by mouth at bedtime., Disp: , Rfl:    Fluticasone-Salmeterol (ADVAIR DISKUS) 100-50 MCG/DOSE AEPB, Inhale 1 puff into the lungs in the morning and at bedtime., Disp: 60 each, Rfl: 1   lisinopril (ZESTRIL) 20 MG tablet, Take 1 tablet (20 mg total) by  mouth daily. (Patient taking differently: Take 20 mg by mouth in the morning.), Disp: 30 tablet, Rfl: 1   loratadine-pseudoephedrine (CLARITIN-D 24-HOUR) 10-240 MG 24 hr tablet, Take 1 tablet by mouth in the morning. , Disp: , Rfl:    montelukast (SINGULAIR) 10 MG tablet, TAKE 1 Tablet BY MOUTH ONCE DAILY AS NEEDED (Patient taking differently: Take 10 mg by mouth daily.), Disp: 90 tablet, Rfl: 1   nitroGLYCERIN (NITROSTAT) 0.4 MG SL tablet, Place 1 tablet (0.4 mg total) under the tongue every 5 (five) minutes as needed for chest pain., Disp: 25 tablet, Rfl: prn   omeprazole (PRILOSEC) 40 MG capsule, TAKE 1 Capsule  BY MOUTH TWICE DAILY, Disp: 180 capsule, Rfl: 0   PROVENTIL HFA 108 (90 Base) MCG/ACT inhaler, INHALE 2 PUFFS BY MOUTH EVERY 6 HOURS AS NEEDED FOR COUGHING, WHEEZING, OR SHORTNESS OF BREATH, Disp: 20.1 g, Rfl: 0   Sodium Sulfate-Mag Sulfate-KCl (SUTAB) [phone] MG TABS, Take as directed (Patient not taking: Reported on 08/15/2020), Disp: 24 tablet, Rfl: 0      Review of Systems  Per HPI unless specifically indicated above     Objective:    BP 98/70   Pulse (!) 101   Temp 98.5 F (36.9 C)   Wt 169 lb (76.7 kg)   SpO2 96%   BMI  34.13 kg/m   Wt Readings from Last 3 Encounters:  08/15/20 169 lb (76.7 kg)  07/22/20 165 lb (74.8 kg)  07/06/20 167 lb 6.4 oz (75.9 kg)    Physical Exam Vitals reviewed.  Constitutional:      General: She is not in acute distress.    Appearance: She is well-developed. She is not toxic-appearing.  HENT:     Head: Normocephalic and atraumatic.  Cardiovascular:     Rate and Rhythm: Normal rate and regular rhythm.     Pulses:          Dorsalis pedis pulses are 2+ on the right side and 2+ on the left side.  Pulmonary:     Effort: Pulmonary effort is normal.     Breath sounds: Normal breath sounds.  Abdominal:     General: Bowel sounds are normal.     Palpations: Abdomen is soft. There is no mass.     Tenderness: There is no abdominal  tenderness.  Musculoskeletal:     Cervical back: Neck supple.     Right lower leg: No edema.     Left lower leg: No edema.     Comments: LLE tender.  No point tenderness.  No edema.  Calf not tight.  Skin w&d.  Mottling as seen in picture.   Lymphadenopathy:     Cervical: No cervical adenopathy.  Skin:    General: Skin is warm and dry.  Neurological:     Mental Status: She is alert and oriented to person, place, and time.     Gait: Gait abnormal.     Comments: Pt using 4-prong cane but still having difficulty ambulating  Psychiatric:        Behavior: Behavior normal.             Assessment & Plan:    Encounter Diagnoses  Name Primary?   Livedo reticularis Yes   Pain of left lower extremity    Difficulty walking    Chronic obstructive pulmonary disease, unspecified COPD type ([place])    Tobacco use disorder    Essential hypertension      -encouraged smoking cessation.  Continue to use inhalers and nebs as needed  -Refer to rheumatogist.   Her [employer] financial assistance is up to date.  Rx gabapentin  -will follow up 6 weeks to check progress.  Pt to contact office sooner for new symptoms or other issues

## 2020-09-13 ENCOUNTER — Other Ambulatory Visit: Payer: Self-pay | Admitting: Physician Assistant

## 2020-09-13 MED ORDER — ALBUTEROL SULFATE (2.5 MG/3ML) 0.083% IN NEBU: 2.5000 mg | INHALATION_SOLUTION | Freq: Four times a day (QID) | RESPIRATORY_TRACT | 99 refills | Status: DC | PRN

## 2020-09-19 NOTE — Progress Notes (Signed)
Office Visit Note  Patient: Pamela Livingston             Date of Birth: 05/11/1965           MRN: [id number]             PCP: Soyla Dryer, PA-C Referring: Soyla Dryer, PA-C Visit Date: 09/20/2020  Subjective:  New Patient (Initial Visit) (Patient complains of left shoulder, bilateral elbow, bilateral hand pain and left sided lower extremity pain. )   History of Present Illness: Pamela Livingston is a 55 y.o. 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 She saw vascular surgery with unremarkable workup including doppler ultrasound, ABIs, and abdominal CT angiogram.  She describes left leg symptoms started since about 1 year ago with pain pretty much the entire left leg worst at the level of the knee.  Also new development of a persistent livedo reticularis rash pattern on the distal left leg.  There is some weakness raising the leg and gait instability noted using a cane for support since about 6 months ago.  She has developed some intermittent numbness of the toes on the left foot but none elsewhere.  There is intermittent swelling of the left knee this was never aspirated and no recent joint imaging.  Since about 6 months ago describes worsening pain in her bilateral thumbs and bilateral elbows with some intermittent swelling at the left elbow.  She has taken prednisone off and on for many years due to her COPD.  Trial of gabapentin was partially beneficial for symptoms.  Otherwise no particular treatments at this time. She has a history of gastritis negative for H. pylori on endoscopy biopsies. She denies skin rashes elsewhere on the body. Only swelling areas are left foot, left knee, and left elbow intermittently. No falls or injuries although she feels unsteady walking with cane currently.  Labs reviewed 06/2020 ANA neg RF neg ESR 55 CRP 0.7 CBC wnl CMP unremarkable   Activities of Daily Living:  Patient  reports morning stiffness for 2 hours.   Patient Reports nocturnal pain.  Difficulty dressing/grooming: Reports Difficulty climbing stairs: Reports Difficulty getting out of chair: Reports Difficulty using hands for taps, buttons, cutlery, and/or writing: Reports  Review of Systems  Constitutional:  Positive for fatigue.  HENT:  Negative for mouth sores, mouth dryness and nose dryness.   Eyes:  Negative for pain, itching, visual disturbance and dryness.  Respiratory:  Positive for shortness of breath and difficulty breathing. Negative for cough and hemoptysis.   Cardiovascular:  Negative for chest pain, palpitations and swelling in legs/feet.  Gastrointestinal:  Positive for abdominal pain, blood in stool, constipation and diarrhea.  Endocrine: Negative for increased urination.  Genitourinary:  Negative for painful urination.  Musculoskeletal:  Positive for joint pain, joint pain, joint swelling, myalgias, muscle weakness, morning stiffness, muscle tenderness and myalgias.  Skin:  Positive for color change, rash and redness.  Allergic/Immunologic: Negative for susceptible to infections.  Neurological:  Positive for weakness. Negative for dizziness, numbness, headaches and memory loss.  Hematological:  Negative for swollen glands.  Psychiatric/Behavioral:  Positive for sleep disturbance. Negative for confusion.    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   Pain in left leg 09/20/2020   Esophageal dysphagia 09/30/2019   RUQ pain 05/27/2019   GERD (gastroesophageal reflux disease) 05/27/2019  Constipation 05/27/2019   Rectal bleeding 05/27/2019   Cigarette nicotine dependence with nicotine-induced disorder 04/15/2015   Chronic obstructive pulmonary disease ([place]) 04/15/2015   Hyperlipidemia 04/15/2015   Abnormal mammogram 04/15/2015   Hematuria 03/07/2015   Abdominal pain, epigastric  03/07/2015   Cigarette nicotine dependence, uncomplicated 18/29/9371   History of substance abuse ([place]) 03/07/2015   Esophageal reflux 03/07/2015    Past Medical History:  Diagnosis Date   Asthma    Chronic pelvic pain in female    COPD (chronic obstructive pulmonary disease) ([place])    Endometriosis    Polysubstance abuse ([place])    marijuana, cocaine, benzos   Stomach ulcer    Suicide attempt ([place]) 2008   by phenergan overdose   Syncope     Family History  Problem Relation Age of Onset   Heart disease Mother    Hypertension Mother    Diabetes Mother    Rheum arthritis Mother    Heart disease Father    Hypertension Father    Heart attack Sister    Stroke Sister    Dementia Sister    Heart attack Maternal Uncle    Heart attack Maternal Uncle    COPD Paternal Grandfather    Colon cancer Neg Hx    Past Surgical History:  Procedure Laterality Date   ABDOMINAL HYSTERECTOMY     BALLOON DILATION N/A 07/26/2020   Procedure: BALLOON DILATION;  Surgeon: Eloise Harman, DO;  [facility];  Service: Endoscopy;  Laterality: N/A;   BIOPSY  07/26/2020   Procedure: BIOPSY;  Surgeon: Eloise Harman, DO;  [facility];  Service: Endoscopy;;   CERVICAL CONE BIOPSY     cervical cancer   CESAREAN SECTION  1993   CHOLECYSTECTOMY     COLONOSCOPY WITH PROPOFOL N/A 07/26/2020   Procedure: COLONOSCOPY WITH PROPOFOL;  Surgeon: Eloise Harman, DO;  [facility];  Service: Endoscopy;  Laterality: N/A;  12:00pm   ESOPHAGOGASTRODUODENOSCOPY (EGD) WITH PROPOFOL N/A 07/26/2020   Procedure: ESOPHAGOGASTRODUODENOSCOPY (EGD) WITH PROPOFOL;  Surgeon: Eloise Harman, DO;  [facility];  Service: Endoscopy;  Laterality: N/A;   RADICAL HYSTERECTOMY WITH TRANSPOSITION OF OVARIES     TOOTH EXTRACTION     TUBAL LIGATION  1993   Social History   Social History Narrative   Not on file   Immunization History  Administered Date(s) Administered    Influenza,inj,Quad PF,6+ Mos 11/03/2017   Moderna Sars-Covid-2 Vaccination 05/07/2019, 06/04/2019     Objective: Vital Signs: BP 119/83 (BP Location: Right Arm, Patient Position: Sitting, Cuff Size: Normal)   Pulse 80   Ht _0  (1.499 m)   Wt 168 lb 3.2 oz (76.3 kg)   BMI 33.97 kg/m    Physical Exam Constitutional:      Appearance: She is obese.  HENT:     Mouth/Throat:     Mouth: Mucous membranes are moist.     Pharynx: Oropharynx is clear.     Comments: Poor dentition Eyes:     Conjunctiva/sclera: Conjunctivae normal.  Cardiovascular:     Rate and Rhythm: Normal rate and regular rhythm.  Pulmonary:     Effort: Pulmonary effort is normal.     Breath sounds: Wheezing present.  Skin:    General: Skin is warm and dry.     Comments: Left distal leg livedo reticularis skin rash  Neurological:     General: No focal deficit present.     Mental Status: She is alert.  Deep Tendon Reflexes: Reflexes normal.  Psychiatric:        Mood and Affect: Mood normal.     Musculoskeletal Exam:  Shoulders full ROM, left shoulder lateral pain with overhead abduction Elbows full ROM, left elbow tenderness over olecranon Wrists full ROM no tenderness or swelling Fingers full ROM tenderness to pressure over 1st CMC joint bilaterally right worse than left Right hip normal, left hip anterior pain with internal rotation, posterior and lateral pain with other movement Right knee with laterally deviated proximal fibula slightly tender otherwise normal, left knee joint line tenderness and does not tolerate full flexion ROM with pain Ankles full ROM no swelling, left ankle tenderness and over achilles tendon  CDAI Exam: CDAI Score: -- Patient Global: --; Provider Global: -- Swollen: 0 ; Tender: 5  Joint Exam 09/20/2020      Right  Left  Elbow      Tender  CMC   Tender   Tender  Knee      Tender  Ankle      Tender     Investigation: No additional findings.  Imaging: XR Hand 2  View Left  Result Date: 09/20/2020 X-ray left hand 2 views Radiocarpal joint space appears normal.  Small calcification or possible ossicle present in the ulnocarpal joint space.  Moderately advanced degenerative arthritis of the first [facility] joint with radial subluxation of the metacarpal base.  MCP joint spaces appear normal.  Mild lateral osteophyte formation at fourth PIP and first IP joints otherwise PIP and DIP joint spaces appear normal.  No erosions juxta-articular osteopenia are seen. Impression Degenerative arthritis most advanced at the first [facility] joint. No erosions or focal demineralization seen.  XR Hand 2 View Right  Result Date: 09/20/2020 X-ray right hand 2 views Radiocarpal joint space intact possible erosion of the tip of the styloid process.  Few cystic changes in the carpal bones was moderately advanced first [facility] joint degenerative arthritis with partial radial subluxation of the proximal phalanx.  MCP joint spaces appear normal.  Early lateral osteophyte formation and small periarticular calcification seen at most PIP joints in the second and third DIP joints.  No other visible joint erosions.  Bone mineralization appears possibly decreased throughout. Impression Degenerative arthritis of multiple sites particularly the first [facility] PIPs and second third DIPs.  Possible erosion at the tip of the ulnar styloid process could be from inflammatory cause although no other clear erosive changes are seen.  XR KNEE 3 VIEW LEFT  Result Date: 09/20/2020 X-ray left knee 3 views Medial lateral joint spaces are preserved possible slight medial narrowing.  Patella appears normal in a normal position.  No large joint effusions or abnormal calcifications are visible.  Bone mineralization appears normal. Impression No significant arthritis changes to correspond with symptoms.   Recent Labs: Lab Results  Component Value Date   WBC 9.7 07/01/2020   HGB 14.0 07/01/2020   PLT 372 07/01/2020   NA 137  07/01/2020   K 3.8 07/01/2020   CL 109 07/01/2020   CO2 19 (L) 07/01/2020   GLUCOSE 110 (H) 07/01/2020   BUN 19 07/01/2020   CREATININE 0.87 07/01/2020   BILITOT 0.5 07/01/2020   ALKPHOS 88 07/01/2020   AST 16 07/01/2020   ALT 13 07/01/2020   PROT 7.8 07/01/2020   ALBUMIN 4.3 07/01/2020   CALCIUM 9.2 07/01/2020   GFRAA >60 08/03/2019    Speciality Comments: No specialty comments available.  Procedures:  No procedures performed Allergies: Asa [aspirin] and Codeine  Assessment / Plan:     Visit Diagnoses: Pain in left leg - Plan: XR KNEE 3 VIEW LEFT, Cyclic citrul peptide antibody, IgG, Sedimentation rate  Chronic left leg pain for 1 year with fairly acute onset now weakness possibly from deconditioning and avoiding weight bearing. I do not appreciate significant bony abnormalities on exam. No inflammatory changes today either but patient says some swelling occurs intermittently. Checking left knee xray for structural changes. Checking CCP Ab titer possible RA presentation although atypical and lack of prednisone response is unusual, and repeating ESR that was high.  Bilateral hand pain - Plan: XR Hand 2 View Right, XR Hand 2 View Left, Cyclic citrul peptide antibody, IgG, Sedimentation rate  Hand pain localizes to the first [facility] joint bilaterally most suggestive for osteoarthritis.  Checking bilateral hand x-rays.  Unfortunately if present she is not a great candidate for oral NSAIDs due to mild gastritis history.  No inflammatory joint changes present on exam today checking CCP antibody and sedimentation rate as above.  Livedo reticularis - Plan: Cyclic citrul peptide antibody, IgG, Sedimentation rate, TSH, Beta-2 glycoprotein antibodies, Cardiolipin antibodies, IgG, IgM, IgA, Lupus Anticoagulant Eval w/Reflex, ANCA Screen Reflex Titer(QUEST)  Nonspecific finding which can be associated with underlying inflammation or vasculitic process.  Normal angiography although this does not  fully exclude small vessel disease.  Checking ANCA screen and antiphospholipid antibody test today.  Also check CCP ruling out rheumatoid arthritis and recheck thyroid hormone function based on this finding.  Orders: Orders Placed This Encounter  Procedures   XR Hand 2 View Right   XR Hand 2 View Left   XR KNEE 3 VIEW LEFT   Cyclic citrul peptide antibody, IgG   Sedimentation rate   TSH   Beta-2 glycoprotein antibodies   Cardiolipin antibodies, IgG, IgM, IgA   Lupus Anticoagulant Eval w/Reflex   ANCA Screen Reflex Titer(QUEST)    No orders of the defined types were placed in this encounter.    Follow-Up Instructions: Return in about 2 weeks (around 10/04/2020) for New pt f/u 2 wks.   Collier Salina, MD  Note - This record has been created using [employer].  Chart creation errors have been sought, but may not always  have been located. Such creation errors do not reflect on  the standard of medical care.

## 2020-09-20 ENCOUNTER — Ambulatory Visit: Payer: Self-pay

## 2020-09-20 ENCOUNTER — Other Ambulatory Visit: Payer: Self-pay

## 2020-09-20 ENCOUNTER — Other Ambulatory Visit (HOSPITAL_COMMUNITY)
Admission: RE | Admit: 2020-09-20 | Discharge: 2020-09-20 | Disposition: A | Discharge status: Home / Self Care | Payer: No Typology Code available for payment source | Source: Other Acute Inpatient Hospital | Attending: Internal Medicine | Admitting: Internal Medicine

## 2020-09-20 ENCOUNTER — Encounter: Payer: Self-pay | Admitting: Internal Medicine

## 2020-09-20 ENCOUNTER — Ambulatory Visit (INDEPENDENT_AMBULATORY_CARE_PROVIDER_SITE_OTHER): Payer: No Typology Code available for payment source | Admitting: Internal Medicine

## 2020-09-20 ENCOUNTER — Encounter (INDEPENDENT_AMBULATORY_CARE_PROVIDER_SITE_OTHER): Payer: Self-pay

## 2020-09-20 VITALS — BP 119/83 | HR 80 | Ht 59.0 in | Wt 168.2 lb

## 2020-09-20 DIAGNOSIS — M79605 Pain in left leg: Secondary | ICD-10-CM | POA: Insufficient documentation

## 2020-09-20 DIAGNOSIS — R231 Pallor: Secondary | ICD-10-CM | POA: Insufficient documentation

## 2020-09-20 DIAGNOSIS — M79642 Pain in left hand: Secondary | ICD-10-CM | POA: Insufficient documentation

## 2020-09-20 DIAGNOSIS — J449 Chronic obstructive pulmonary disease, unspecified: Secondary | ICD-10-CM | POA: Insufficient documentation

## 2020-09-20 DIAGNOSIS — M79641 Pain in right hand: Secondary | ICD-10-CM | POA: Insufficient documentation

## 2020-09-20 DIAGNOSIS — R2689 Other abnormalities of gait and mobility: Secondary | ICD-10-CM | POA: Insufficient documentation

## 2020-09-20 DIAGNOSIS — G8929 Other chronic pain: Secondary | ICD-10-CM | POA: Insufficient documentation

## 2020-09-20 DIAGNOSIS — R609 Edema, unspecified: Secondary | ICD-10-CM | POA: Insufficient documentation

## 2020-09-20 DIAGNOSIS — R262 Difficulty in walking, not elsewhere classified: Secondary | ICD-10-CM | POA: Insufficient documentation

## 2020-09-20 LAB — TSH: TSH: 2.457 u[IU]/mL (ref 0.350–4.500)

## 2020-09-20 LAB — SEDIMENTATION RATE: Sed Rate: 19 mm/hr (ref 0–22)

## 2020-09-21 LAB — ANCA PROFILE
Anti-MPO Antibodies: <0.2 units (ref 0.0–0.9)
Anti-PR3 Antibodies: <0.2 units (ref 0.0–0.9)
Atypical P-ANCA titer: <1:20 {titer}
C-ANCA: <1:20 {titer}
P-ANCA: <1:20 {titer}

## 2020-09-21 LAB — LUPUS ANTICOAGULANT PANEL
DRVVT: 36.2 s (ref 0.0–47.0)
PTT Lupus Anticoagulant: 37.4 s (ref 0.0–51.9)

## 2020-09-21 LAB — IGG: IgG (Immunoglobin G), Serum: 861 mg/dL (ref 586–1602)

## 2020-09-22 LAB — BETA-2-GLYCOPROTEIN I ABS, IGG/M/A
Beta-2 Glyco I IgG: <9 GPI IgG units (ref 0–20)
Beta-2-Glycoprotein I IgA: <9 GPI IgA units (ref 0–25)
Beta-2-Glycoprotein I IgM: <9 GPI IgM units (ref 0–32)

## 2020-09-23 LAB — CARDIOLIPIN ANTIBODIES, IGG, IGM, IGA
Anticardiolipin IgA: <9 APL U/mL (ref 0–11)
Anticardiolipin IgG: <9 GPL U/mL (ref 0–14)
Anticardiolipin IgM: <9 MPL U/mL (ref 0–12)

## 2020-09-23 LAB — CYCLIC CITRUL PEPTIDE ANTIBODY, IGG/IGA: CCP Antibodies IgG/IgA: 7 units (ref 0–19)

## 2020-09-27 ENCOUNTER — Ambulatory Visit: Payer: Self-pay | Admitting: Physician Assistant

## 2020-10-03 ENCOUNTER — Ambulatory Visit: Payer: Self-pay | Admitting: Physician Assistant

## 2020-10-03 ENCOUNTER — Encounter: Payer: Self-pay | Admitting: Internal Medicine

## 2020-10-03 ENCOUNTER — Encounter: Payer: Self-pay | Admitting: Physician Assistant

## 2020-10-03 DIAGNOSIS — F17200 Nicotine dependence, unspecified, uncomplicated: Secondary | ICD-10-CM

## 2020-10-03 DIAGNOSIS — Z1239 Encounter for other screening for malignant neoplasm of breast: Secondary | ICD-10-CM

## 2020-10-03 DIAGNOSIS — J449 Chronic obstructive pulmonary disease, unspecified: Secondary | ICD-10-CM

## 2020-10-03 DIAGNOSIS — E785 Hyperlipidemia, unspecified: Secondary | ICD-10-CM

## 2020-10-03 DIAGNOSIS — M79605 Pain in left leg: Secondary | ICD-10-CM

## 2020-10-03 DIAGNOSIS — I10 Essential (primary) hypertension: Secondary | ICD-10-CM

## 2020-10-03 NOTE — Progress Notes (Signed)
Office Visit Note  Patient: Pamela Livingston             Date of Birth: [birth year]           MRN: [id number]             PCP: Soyla Dryer, PA-C Referring: Soyla Dryer, PA-C Visit Date: 10/04/2020   Subjective:  Follow-up (Patient complains of continued left shoulder, bilateral elbow, bilateral hand pain and left sided lower extremity pain.)   History of Present Illness: Pamela Livingston is a 55 y.o. female here for follow up for joint pain in multiple sites at her last visit hand x-rays demonstrated osteoarthritis but the left knee no findings to explain the continued joint pain swelling stiffness and instability.  He has not had any new fall continues to walk with a cane for additional stability.  She continues having intermittent numbness or cold sensation or paresthesias distal to the left knee.  No significant changes in symptoms compared to at our initial visit.   Previous HPI 09/20/20 Pamela Livingston is a 55 y.o. 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 She saw vascular surgery with unremarkable workup including doppler ultrasound, ABIs, and abdominal CT angiogram.  She describes left leg symptoms started since about 1 year ago with pain pretty much the entire left leg worst at the level of the knee.  Also new development of a persistent livedo reticularis rash pattern on the distal left leg.  There is some weakness raising the leg and gait instability noted using a cane for support since about 6 months ago.  She has developed some intermittent numbness of the toes on the left foot but none elsewhere.  There is intermittent swelling of the left knee this was never aspirated and no recent joint imaging.  Since about 6 months ago describes worsening pain in her bilateral thumbs and bilateral elbows with some intermittent swelling at the left elbow.  She has taken prednisone off and on for many years due to her COPD.   Trial of gabapentin was partially beneficial for symptoms.  Otherwise no particular treatments at this time. She has a history of gastritis negative for H. pylori on endoscopy biopsies. She denies skin rashes elsewhere on the body. Only swelling areas are left foot, left knee, and left elbow intermittently. No falls or injuries although she feels unsteady walking with cane currently.   Labs reviewed 06/2020 ANA neg RF neg ESR 55 CRP 0.7 CBC wnl CMP unremarkable   Review of Systems  Constitutional:  Negative for fatigue.  HENT:  Negative for mouth sores, mouth dryness and nose dryness.   Eyes:  Negative for pain, itching, visual disturbance and dryness.  Respiratory:  Positive for cough, shortness of breath and difficulty breathing. Negative for hemoptysis.   Cardiovascular:  Negative for chest pain, palpitations and swelling in legs/feet.  Gastrointestinal:  Negative for abdominal pain, blood in stool, constipation and diarrhea.  Endocrine: Negative for increased urination.  Genitourinary:  Negative for painful urination.  Musculoskeletal:  Positive for joint pain, joint pain, joint swelling, myalgias, muscle weakness, morning stiffness, muscle tenderness and myalgias.  Skin:  Negative for color change, rash and redness.  Allergic/Immunologic: Positive for susceptible to infections.  Neurological:  Negative for dizziness, numbness, headaches, memory loss and weakness.  Hematological:  Negative for swollen glands.  Psychiatric/Behavioral:  Positive for sleep disturbance. Negative for confusion.    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   Pain in left leg 09/20/2020   Esophageal dysphagia 09/30/2019   RUQ pain 05/27/2019   GERD (gastroesophageal reflux disease) 05/27/2019   Constipation 05/27/2019   Rectal bleeding 05/27/2019   Cigarette nicotine dependence with nicotine-induced disorder  04/15/2015   Chronic obstructive pulmonary disease ([place]) 04/15/2015   Hyperlipidemia 04/15/2015   Abnormal mammogram 04/15/2015   Hematuria 03/07/2015   Abdominal pain, epigastric 03/07/2015   Cigarette nicotine dependence, uncomplicated 63/14/9702   History of substance abuse ([place]) 03/07/2015   Esophageal reflux 03/07/2015    Past Medical History:  Diagnosis Date   Asthma    Chronic pelvic pain in female    COPD (chronic obstructive pulmonary disease) ([place])    Endometriosis    Polysubstance abuse ([place])    marijuana, cocaine, benzos   Stomach ulcer    Suicide attempt ([place]) 2008   by phenergan overdose   Syncope     Family History  Problem Relation Age of Onset   Heart disease Mother    Hypertension Mother    Diabetes Mother    Rheum arthritis Mother    Heart disease Father    Hypertension Father    Heart attack Sister    Stroke Sister    Dementia Sister    Heart attack Maternal Uncle    Heart attack Maternal Uncle    COPD Paternal Grandfather    Colon cancer Neg Hx    Past Surgical History:  Procedure Laterality Date   ABDOMINAL HYSTERECTOMY     BALLOON DILATION N/A 07/26/2020   Procedure: BALLOON DILATION;  Surgeon: Eloise Harman, DO;  [facility];  Service: Endoscopy;  Laterality: N/A;   BIOPSY  07/26/2020   Procedure: BIOPSY;  Surgeon: Eloise Harman, DO;  [facility];  Service: Endoscopy;;   CERVICAL CONE BIOPSY     cervical cancer   CESAREAN SECTION  1993   CHOLECYSTECTOMY     COLONOSCOPY WITH PROPOFOL N/A 07/26/2020   Procedure: COLONOSCOPY WITH PROPOFOL;  Surgeon: Eloise Harman, DO;  [facility];  Service: Endoscopy;  Laterality: N/A;  12:00pm   ESOPHAGOGASTRODUODENOSCOPY (EGD) WITH PROPOFOL N/A 07/26/2020   Procedure: ESOPHAGOGASTRODUODENOSCOPY (EGD) WITH PROPOFOL;  Surgeon: Eloise Harman, DO;  [facility];  Service: Endoscopy;  Laterality: N/A;   RADICAL HYSTERECTOMY WITH TRANSPOSITION OF OVARIES      TOOTH EXTRACTION     TUBAL LIGATION  1993   Social History   Social History Narrative   Not on file   Immunization History  Administered Date(s) Administered   Influenza,inj,Quad PF,6+ Mos 11/03/2017   Moderna Sars-Covid-2 Vaccination 05/07/2019, 06/04/2019     Objective: Vital Signs: BP 125/88 (BP Location: Left Arm, Patient Position: Sitting, Cuff Size: Normal)   Pulse 90   Ht 4' 11"  (1.499 m)   Wt 167 lb 12.8 oz (76.1 kg)   BMI 33.89 kg/m    Physical Exam Musculoskeletal:     Comments: Trace left ankle swelling compared to right without pitting  Skin:    Comments: Livedo reticularis rash on left leg knee and below without any lesions  Neurological:     Deep Tendon Reflexes: Reflexes normal.  Psychiatric:        Mood and Affect: Mood normal.     Musculoskeletal Exam:  Elbows full ROM no swelling Wrists full ROM no tenderness or swelling Fingers full ROM  tenderness to pressure over 1st [facility] joint bilaterally Right hip normal, left hip anterior pain with internal rotation, posterior and lateral pain with other movement Right knee with laterally deviated proximal fibula slightly tender otherwise normal, left knee joint line tenderness and does not tolerate full flexion ROM with pain Ankles full ROM no swelling, left ankle tenderness and over achilles tendon  Investigation: No additional findings.  Imaging: XR Hand 2 View Left  Result Date: 09/20/2020 X-ray left hand 2 views Radiocarpal joint space appears normal.  Small calcification or possible ossicle present in the ulnocarpal joint space.  Moderately advanced degenerative arthritis of the first [facility] joint with radial subluxation of the metacarpal base.  MCP joint spaces appear normal.  Mild lateral osteophyte formation at fourth PIP and first IP joints otherwise PIP and DIP joint spaces appear normal.  No erosions juxta-articular osteopenia are seen. Impression Degenerative arthritis most advanced at the first [facility]  joint. No erosions or focal demineralization seen.  XR Hand 2 View Right  Result Date: 09/20/2020 X-ray right hand 2 views Radiocarpal joint space intact possible erosion of the tip of the styloid process.  Few cystic changes in the carpal bones was moderately advanced first [facility] joint degenerative arthritis with partial radial subluxation of the proximal phalanx.  MCP joint spaces appear normal.  Early lateral osteophyte formation and small periarticular calcification seen at most PIP joints in the second and third DIP joints.  No other visible joint erosions.  Bone mineralization appears possibly decreased throughout. Impression Degenerative arthritis of multiple sites particularly the first [facility] PIPs and second third DIPs.  Possible erosion at the tip of the ulnar styloid process could be from inflammatory cause although no other clear erosive changes are seen.  XR KNEE 3 VIEW LEFT  Result Date: 09/20/2020 X-ray left knee 3 views Medial lateral joint spaces are preserved possible slight medial narrowing.  Patella appears normal in a normal position.  No large joint effusions or abnormal calcifications are visible.  Bone mineralization appears normal. Impression No significant arthritis changes to correspond with symptoms.   Recent Labs: Lab Results  Component Value Date   WBC 9.7 07/01/2020   HGB 14.0 07/01/2020   PLT 372 07/01/2020   NA 137 07/01/2020   K 3.8 07/01/2020   CL 109 07/01/2020   CO2 19 (L) 07/01/2020   GLUCOSE 110 (H) 07/01/2020   BUN 19 07/01/2020   CREATININE 0.87 07/01/2020   BILITOT 0.5 07/01/2020   ALKPHOS 88 07/01/2020   AST 16 07/01/2020   ALT 13 07/01/2020   PROT 7.8 07/01/2020   ALBUMIN 4.3 07/01/2020   CALCIUM 9.2 07/01/2020   GFRAA >60 08/03/2019    Speciality Comments: No specialty comments available.  Procedures:  No procedures performed Allergies: Asa [aspirin] and Codeine   Assessment / Plan:     Visit Diagnoses: Pain in left leg  Continued pain  in the left leg extending above and below but worst centered around the knee.  There is no significant osteoarthritis findings on the x-ray to explain her degree of symptoms.  No significant joint effusion seen on ultrasound inspection today and has not had aspiration of previous swelling episodes.  I recommend for left knee MRI I am suspicious for possible ligamentous or meniscal insufficiency since the joint space looked okay.  Also curious if there could be some type of structure impingement or issue to explain the overlying skin changes and distal neuropathic sounding symptoms.  Bilateral hand pain  Continued bilateral hand  pain seems most consistent with osteoarthritis.  Discussed conservative treatments symptoms not currently bad enough to require additional local or systemic steroid medication.  Livedo reticularis  Laboratory work-up for underlying vasculitis process was all negative.  To think further imaging studies of the left knee would be beneficial.  Orders: No orders of the defined types were placed in this encounter.  No orders of the defined types were placed in this encounter.    Follow-Up Instructions: Return in about 4 weeks (around 11/01/2020) for OA multiple sites and leg swelling f/u 4 wks.   Collier Salina, MD  Note - This record has been created using [employer].  Chart creation errors have been sought, but may not always  have been located. Such creation errors do not reflect on  the standard of medical care.

## 2020-10-03 NOTE — Progress Notes (Signed)
There were no vitals taken for this visit.   Subjective:    Patient ID: Pamela Livingston, female    DOB: Sep 01, 1965, 55 y.o.   MRN: [id number]  HPI: Pamela Livingston is a 55 y.o. female presenting on 10/03/2020 for No chief complaint on file.   HPI   This is a telemedicine appointment.  It is via telephone due to pt was unable to connect through [employer] due to she has poor signal at her home.  I connected with  Pamela Livingston on 10/03/20 by a video enabled telemedicine application and verified that I am speaking with the correct person using two identifiers.   I discussed the limitations of evaluation and management by telemedicine. The patient expressed understanding and agreed to proceed.  Pt is in her parked car somewhere.  Provider is working from home office.    Pt is 4yoF with COPD, HTN and dyslipidemia.    She was referred to vascular surgery for lower extremity pain and discoloration.  Surgery was unable to find cause and recommended referral to Rheumatology.  She has been seen by rheumatology and has follow up appointment there tomorrow.  Pt says her Leg color is better but pain is the same.  Pt says her Cafa is still active but is due to update in October  She says her Breathing is okay.   She is Using 3 nebs/daily.  She continues to smoke.   She is happy today because she is on her way to hospital to see her new grandbaby.       Relevant past medical, surgical, family and social history reviewed and updated as indicated. Interim medical history since our last visit reviewed. Allergies and medications reviewed and updated.    Current Outpatient Medications:    acetaminophen (TYLENOL) 500 MG tablet, Take 1,500 mg by mouth every 6 (six) hours as needed for moderate pain., Disp: , Rfl:    albuterol (PROVENTIL) (2.5 MG/3ML) 0.083% nebulizer solution, Take 3 mLs (2.5 mg total) by nebulization every 6 (six) hours as needed for wheezing or shortness of breath., Disp: 75 mL, Rfl:  PRN   atorvastatin (LIPITOR) 20 MG tablet, TAKE 1 Tablet BY MOUTH ONCE EVERY DAY (Patient taking differently: Take 20 mg by mouth every evening.), Disp: 90 tablet, Rfl: 1   diphenhydrAMINE (BENADRYL) 25 MG tablet, Take 50 mg by mouth at bedtime., Disp: , Rfl:    Fluticasone-Salmeterol (ADVAIR DISKUS) 100-50 MCG/DOSE AEPB, Inhale 1 puff into the lungs in the morning and at bedtime., Disp: 60 each, Rfl: 1   lisinopril (ZESTRIL) 20 MG tablet, Take 1 tablet (20 mg total) by mouth daily. (Patient taking differently: Take 20 mg by mouth in the morning.), Disp: 30 tablet, Rfl: 1   loratadine-pseudoephedrine (CLARITIN-D 24-HOUR) 10-240 MG 24 hr tablet, Take 1 tablet by mouth in the morning. , Disp: , Rfl:    montelukast (SINGULAIR) 10 MG tablet, TAKE 1 Tablet BY MOUTH ONCE DAILY AS NEEDED (Patient taking differently: Take 10 mg by mouth daily.), Disp: 90 tablet, Rfl: 1   omeprazole (PRILOSEC) 40 MG capsule, TAKE 1 Capsule  BY MOUTH TWICE DAILY, Disp: 180 capsule, Rfl: 0   PROVENTIL HFA 108 (90 Base) MCG/ACT inhaler, INHALE 2 PUFFS BY MOUTH EVERY 6 HOURS AS NEEDED FOR COUGHING, WHEEZING, OR SHORTNESS OF BREATH, Disp: 20.1 g, Rfl: 0   gabapentin (NEURONTIN) 100 MG capsule, Take 1 capsule (100 mg total) by mouth 2 (two) times daily as needed. (Patient not taking: Reported  on 10/03/2020), Disp: 60 capsule, Rfl: 1   nitroGLYCERIN (NITROSTAT) 0.4 MG SL tablet, Place 1 tablet (0.4 mg total) under the tongue every 5 (five) minutes as needed for chest pain. (Patient not taking: Reported on 10/03/2020), Disp: 25 tablet, Rfl: prn   Sodium Sulfate-Mag Sulfate-KCl (SUTAB) [phone] MG TABS, Take as directed (Patient not taking: Reported on 10/03/2020), Disp: 24 tablet, Rfl: 0   Review of Systems  Per HPI unless specifically indicated above     Objective:    There were no vitals taken for this visit.  Wt Readings from Last 3 Encounters:  09/20/20 168 lb 3.2 oz (76.3 kg)  08/15/20 169 lb (76.7 kg)  07/22/20 165 lb  (74.8 kg)    Physical Exam Pulmonary:     Effort: No respiratory distress.     Comments: Pt is talking in complete sentences without dyspnea Neurological:     Mental Status: She is alert and oriented to person, place, and time.  Psychiatric:        Attention and Perception: Attention normal.        Speech: Speech normal.        Behavior: Behavior is cooperative.           Assessment & Plan:    Encounter Diagnoses  Name Primary?   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  She is to contact office sooner prn

## 2020-10-04 ENCOUNTER — Encounter: Payer: Self-pay | Admitting: Internal Medicine

## 2020-10-04 ENCOUNTER — Other Ambulatory Visit: Payer: Self-pay

## 2020-10-04 ENCOUNTER — Ambulatory Visit (INDEPENDENT_AMBULATORY_CARE_PROVIDER_SITE_OTHER): Payer: Self-pay | Admitting: Internal Medicine

## 2020-10-04 VITALS — BP 125/88 | HR 90 | Ht 59.0 in | Wt 167.8 lb

## 2020-10-04 DIAGNOSIS — M79605 Pain in left leg: Secondary | ICD-10-CM

## 2020-10-04 DIAGNOSIS — M79641 Pain in right hand: Secondary | ICD-10-CM

## 2020-10-04 DIAGNOSIS — M79642 Pain in left hand: Secondary | ICD-10-CM

## 2020-10-04 DIAGNOSIS — R231 Pallor: Secondary | ICD-10-CM

## 2020-10-05 ENCOUNTER — Other Ambulatory Visit: Payer: Self-pay

## 2020-10-05 DIAGNOSIS — Z1231 Encounter for screening mammogram for malignant neoplasm of breast: Secondary | ICD-10-CM

## 2020-10-10 ENCOUNTER — Ambulatory Visit (HOSPITAL_COMMUNITY)
Admission: RE | Admit: 2020-10-10 | Discharge: 2020-10-10 | Disposition: A | Discharge status: Home / Self Care | Payer: No Typology Code available for payment source | Source: Ambulatory Visit | Attending: Physician Assistant | Admitting: Physician Assistant

## 2020-10-10 ENCOUNTER — Other Ambulatory Visit: Payer: Self-pay

## 2020-10-10 DIAGNOSIS — Z1231 Encounter for screening mammogram for malignant neoplasm of breast: Secondary | ICD-10-CM

## 2020-10-19 ENCOUNTER — Other Ambulatory Visit: Payer: Self-pay

## 2020-10-19 ENCOUNTER — Ambulatory Visit (HOSPITAL_COMMUNITY)
Admission: RE | Admit: 2020-10-19 | Discharge: 2020-10-19 | Disposition: A | Discharge status: Home / Self Care | Payer: Self-pay | Source: Ambulatory Visit | Attending: Internal Medicine | Admitting: Internal Medicine

## 2020-10-19 DIAGNOSIS — M79605 Pain in left leg: Secondary | ICD-10-CM | POA: Insufficient documentation

## 2020-10-24 ENCOUNTER — Emergency Department (HOSPITAL_COMMUNITY)
Admission: EM | Admit: 2020-10-24 | Discharge: 2020-10-24 | Disposition: A | Discharge status: Home / Self Care | Payer: No Typology Code available for payment source | Attending: Emergency Medicine | Admitting: Emergency Medicine

## 2020-10-24 ENCOUNTER — Emergency Department (HOSPITAL_COMMUNITY): Payer: No Typology Code available for payment source

## 2020-10-24 ENCOUNTER — Other Ambulatory Visit: Payer: Self-pay

## 2020-10-24 ENCOUNTER — Encounter (HOSPITAL_COMMUNITY): Payer: Self-pay | Admitting: Emergency Medicine

## 2020-10-24 DIAGNOSIS — F17210 Nicotine dependence, cigarettes, uncomplicated: Secondary | ICD-10-CM | POA: Insufficient documentation

## 2020-10-24 DIAGNOSIS — J45909 Unspecified asthma, uncomplicated: Secondary | ICD-10-CM | POA: Insufficient documentation

## 2020-10-24 DIAGNOSIS — R319 Hematuria, unspecified: Secondary | ICD-10-CM | POA: Insufficient documentation

## 2020-10-24 DIAGNOSIS — R109 Unspecified abdominal pain: Secondary | ICD-10-CM

## 2020-10-24 DIAGNOSIS — J449 Chronic obstructive pulmonary disease, unspecified: Secondary | ICD-10-CM | POA: Insufficient documentation

## 2020-10-24 DIAGNOSIS — Z79899 Other long term (current) drug therapy: Secondary | ICD-10-CM | POA: Insufficient documentation

## 2020-10-24 LAB — CBC WITH DIFFERENTIAL/PLATELET
Abs Immature Granulocytes: 0.02 10*3/uL (ref 0.00–0.07)
Basophils Absolute: 0.1 10*3/uL (ref 0.0–0.1)
Basophils Relative: 1 %
Eosinophils Absolute: 0.5 10*3/uL (ref 0.0–0.5)
Eosinophils Relative: 6 %
HCT: 40.2 % (ref 36.0–46.0)
Hemoglobin: 13.8 g/dL (ref 12.0–15.0)
Immature Granulocytes: 0 %
Lymphocytes Relative: 40 %
Lymphs Abs: 3.2 10*3/uL (ref 0.7–4.0)
MCH: 34 pg (ref 26.0–34.0)
MCHC: 34.3 g/dL (ref 30.0–36.0)
MCV: 99 fL (ref 80.0–100.0)
Monocytes Absolute: 0.6 10*3/uL (ref 0.1–1.0)
Monocytes Relative: 7 %
Neutro Abs: 3.8 10*3/uL (ref 1.7–7.7)
Neutrophils Relative %: 46 %
Platelets: 352 10*3/uL (ref 150–400)
RBC: 4.06 MIL/uL (ref 3.87–5.11)
RDW: 13.8 % (ref 11.5–15.5)
WBC: 8 10*3/uL (ref 4.0–10.5)
nRBC: 0 % (ref 0.0–0.2)

## 2020-10-24 LAB — URINALYSIS, ROUTINE W REFLEX MICROSCOPIC
Bacteria, UA: NONE SEEN
Bilirubin Urine: NEGATIVE
Glucose, UA: NEGATIVE mg/dL
Ketones, ur: NEGATIVE mg/dL
Leukocytes,Ua: NEGATIVE
Nitrite: NEGATIVE
Protein, ur: NEGATIVE mg/dL
Specific Gravity, Urine: 1.003 — ABNORMAL LOW (ref 1.005–1.030)
pH: 6 (ref 5.0–8.0)

## 2020-10-24 LAB — COMPREHENSIVE METABOLIC PANEL
ALT: 12 U/L (ref 0–44)
AST: 15 U/L (ref 15–41)
Albumin: 4.3 g/dL (ref 3.5–5.0)
Alkaline Phosphatase: 88 U/L (ref 38–126)
Anion gap: 8 (ref 5–15)
BUN: 12 mg/dL (ref 6–20)
CO2: 23 mmol/L (ref 22–32)
Calcium: 9.5 mg/dL (ref 8.9–10.3)
Chloride: 106 mmol/L (ref 98–111)
Creatinine, Ser: 0.76 mg/dL (ref 0.44–1.00)
GFR, Estimated: >60 mL/min (ref >60)
Glucose, Bld: 87 mg/dL (ref 70–99)
Potassium: 3.8 mmol/L (ref 3.5–5.1)
Sodium: 137 mmol/L (ref 135–145)
Total Bilirubin: 0.7 mg/dL (ref 0.3–1.2)
Total Protein: 7.6 g/dL (ref 6.5–8.1)

## 2020-10-24 LAB — LIPASE, BLOOD: Lipase: 26 U/L (ref 11–51)

## 2020-10-24 MED ORDER — SODIUM CHLORIDE 0.9 % IV SOLN: INTRAVENOUS | Status: DC

## 2020-10-24 MED ORDER — CYCLOBENZAPRINE HCL 10 MG PO TABS: 10.0000 mg | ORAL_TABLET | Freq: Two times a day (BID) | ORAL | 0 refills | Status: DC | PRN

## 2020-10-24 MED ORDER — ONDANSETRON HCL 4 MG/2ML IJ SOLN
4.0000 mg | Freq: Once | INTRAMUSCULAR | Status: AC
  Administered 2020-10-24: 4 mg via INTRAVENOUS
  Filled 2020-10-24: qty 2

## 2020-10-24 MED ORDER — HYDROMORPHONE HCL 1 MG/ML IJ SOLN
1.0000 mg | Freq: Once | INTRAMUSCULAR | Status: AC
  Administered 2020-10-24: 1 mg via INTRAVENOUS
  Filled 2020-10-24: qty 1

## 2020-10-24 MED ORDER — SODIUM CHLORIDE 0.9 % IV BOLUS
1000.0000 mL | Freq: Once | INTRAVENOUS | Status: AC
  Administered 2020-10-24: 1000 mL via INTRAVENOUS

## 2020-10-24 MED ORDER — HYDROCODONE-ACETAMINOPHEN 5-325 MG PO TABS: 1.0000 | ORAL_TABLET | Freq: Four times a day (QID) | ORAL | 0 refills | Status: DC | PRN

## 2020-10-24 NOTE — ED Triage Notes (Signed)
Pt to the ED with complaints of right side flank pain since Saturday. Pt denies urinary symptoms.

## 2020-10-24 NOTE — Discharge Instructions (Signed)
Take the medications as needed for pain.  Follow-up with your doctor to be rechecked

## 2020-10-24 NOTE — ED Provider Notes (Signed)
[facility] EMERGENCY DEPARTMENT Provider Note   CSN: [id number] Arrival date & time: 10/24/20  4098     History Chief Complaint  Patient presents with   Flank Pain    Pamela Livingston is a 55 y.o. female.   Flank Pain   Patient presents to the ED for evaluation of flank pain.  Patient states it started over the weekend.  The pain was in the right side.  It was not too severe initially.  The pain however has been increasing intensity.  This morning the pain is very severe.  It sharp and radiates towards the front of her abdomen.  She wonders if there is something wrong with her kidney.  She has not noticed any burning with urination.  No blood in her urine.  She has not vomited but she does feel nauseous.  No diarrhea or constipation.  No fevers or chills.  No chest pain or shortness of breath. patient does not have any history of kidney stones but states she has had problems with her kidneys many years ago.  She is not sure exactly what was wrong.  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   Pain in left leg 09/20/2020   Esophageal dysphagia 09/30/2019   RUQ pain 05/27/2019   GERD (gastroesophageal reflux disease) 05/27/2019   Constipation 05/27/2019   Rectal bleeding 05/27/2019   Cigarette nicotine dependence with nicotine-induced disorder 04/15/2015   Chronic obstructive pulmonary disease ([place]) 04/15/2015   Hyperlipidemia 04/15/2015   Abnormal mammogram 04/15/2015   Hematuria 03/07/2015   Abdominal pain, epigastric 03/07/2015   Cigarette nicotine  dependence, uncomplicated 11/91/4782   History of substance abuse ([place]) 03/07/2015   Esophageal reflux 03/07/2015    Past Surgical History:  Procedure Laterality Date   ABDOMINAL HYSTERECTOMY     BALLOON DILATION N/A 07/26/2020   Procedure: BALLOON DILATION;  Surgeon: Eloise Harman, DO;  Location[facility];  Service: Endoscopy;  Laterality: N/A;   BIOPSY  07/26/2020   Procedure: BIOPSY;  Surgeon: Eloise Harman, DO;  Location: [facility];  Service: Endoscopy;;   CERVICAL CONE BIOPSY     cervical cancer   CESAREAN SECTION  1993   CHOLECYSTECTOMY     COLONOSCOPY WITH PROPOFOL N/A 07/26/2020   Procedure: COLONOSCOPY WITH PROPOFOL;  Surgeon: Eloise Harman, DO;  Location: [facility];  Service: Endoscopy;  Laterality: N/A;  12:00pm   ESOPHAGOGASTRODUODENOSCOPY (EGD) WITH PROPOFOL N/A 07/26/2020   Procedure: ESOPHAGOGASTRODUODENOSCOPY (EGD) WITH PROPOFOL;  Surgeon: Eloise Harman, DO;  Location: [facility];  Service: Endoscopy;  Laterality: N/A;   RADICAL HYSTERECTOMY WITH TRANSPOSITION OF OVARIES     TOOTH EXTRACTION     TUBAL LIGATION  1993     OB History     Gravida  2   Para  2   Term  2   Preterm      AB      Living  2      SAB  IAB      Ectopic      Multiple      Live Births              Family History  Problem Relation Age of Onset   Heart disease Mother    Hypertension Mother    Diabetes Mother    Rheum arthritis Mother    Heart disease Father    Hypertension Father    Heart attack Sister    Stroke Sister    Dementia Sister    Heart attack Maternal Uncle    Heart attack Maternal Uncle    COPD Paternal Grandfather    Colon cancer Neg Hx     Social History   Tobacco Use   Smoking status: Every Day    Packs/day: 0.50    Years: 27.00    Pack years: 13.50    Types: Cigarettes   Smokeless tobacco: Never  Vaping Use   Vaping Use: Never used  Substance Use Topics   Alcohol use: Not Currently   Drug use: Yes     Types: Marijuana    Home Medications Prior to Admission medications   Medication Sig Start Date End Date Taking? Authorizing Provider  albuterol (PROVENTIL) (2.5 MG/3ML) 0.083% nebulizer solution Take 3 mLs (2.5 mg total) by nebulization every 6 (six) hours as needed for wheezing or shortness of breath. 09/13/20  Yes Soyla Dryer, PA-C  atorvastatin (LIPITOR) 20 MG tablet TAKE 1 Tablet BY MOUTH ONCE EVERY DAY Patient taking differently: Take 20 mg by mouth every evening. 04/28/20  Yes Soyla Dryer, PA-C  cyclobenzaprine (FLEXERIL) 10 MG tablet Take 1 tablet (10 mg total) by mouth 2 (two) times daily as needed for muscle spasms. 10/24/20  Yes Dorie Rank, MD  diphenhydrAMINE (BENADRYL) 25 MG tablet Take 50 mg by mouth at bedtime.   Yes [provider]  Fluticasone-Salmeterol (ADVAIR DISKUS) 100-50 MCG/DOSE AEPB Inhale 1 puff into the lungs in the morning and at bedtime. 07/30/19  Yes Soyla Dryer, PA-C  gabapentin (NEURONTIN) 100 MG capsule Take 1 capsule (100 mg total) by mouth 2 (two) times daily as needed. Patient taking differently: Take 300 mg by mouth in the morning and at bedtime. 08/15/20  Yes Soyla Dryer, PA-C  HYDROcodone-acetaminophen (NORCO/VICODIN) 5-325 MG tablet Take 1 tablet by mouth every 6 (six) hours as needed. 10/24/20  Yes Dorie Rank, MD  lisinopril (ZESTRIL) 20 MG tablet Take 1 tablet (20 mg total) by mouth daily. Patient taking differently: Take 20 mg by mouth in the morning. 04/19/20  Yes Soyla Dryer, PA-C  loratadine-pseudoephedrine (CLARITIN-D 24-HOUR) 10-240 MG 24 hr tablet Take 1 tablet by mouth in the morning.    Yes [provider]  montelukast (SINGULAIR) 10 MG tablet TAKE 1 Tablet BY MOUTH ONCE DAILY AS NEEDED Patient taking differently: Take 10 mg by mouth daily. 05/04/20  Yes Soyla Dryer, PA-C  omeprazole (PRILOSEC) 40 MG capsule TAKE 1 Capsule  BY MOUTH TWICE DAILY 07/28/20  Yes Soyla Dryer, PA-C  PROVENTIL HFA 108 (90 Base)  MCG/ACT inhaler INHALE 2 PUFFS BY MOUTH EVERY 6 HOURS AS NEEDED FOR COUGHING, WHEEZING, OR SHORTNESS OF BREATH 07/28/20  Yes Soyla Dryer, PA-C  acetaminophen (TYLENOL) 500 MG tablet Take 1,500 mg by mouth every 6 (six) hours as needed for moderate pain.    [provider]  nitroGLYCERIN (NITROSTAT) 0.4 MG SL tablet Place 1 tablet (0.4 mg total) under the tongue every 5 (five) minutes as needed for chest pain. Patient not taking:  No sig reported 10/02/18   Soyla Dryer, PA-C  Sodium Sulfate-Mag Sulfate-KCl (SUTAB) [phone] MG TABS Take as directed Patient not taking: No sig reported 07/06/20   Eloise Harman, DO    Allergies    Asa [aspirin] and Codeine  Review of Systems   Review of Systems  Genitourinary:  Positive for flank pain.  All other systems reviewed and are negative.  Physical Exam Updated Vital Signs BP 110/82   Pulse 62   Temp 97.7 F (36.5 C) (Oral)   Resp 18   Ht 1.499 m (4\' 11" )   Wt 76.1 kg   SpO2 100%   BMI 33.89 kg/m   Physical Exam Vitals and nursing note reviewed.  Constitutional:      General: She is in acute distress.     Appearance: She is well-developed.     Comments: Appears to be in pain  HENT:     Head: Normocephalic and atraumatic.     Right Ear: External ear normal.     Left Ear: External ear normal.  Eyes:     General: No scleral icterus.       Right eye: No discharge.        Left eye: No discharge.     Conjunctiva/sclera: Conjunctivae normal.  Neck:     Trachea: No tracheal deviation.  Cardiovascular:     Rate and Rhythm: Normal rate and regular rhythm.  Pulmonary:     Effort: Pulmonary effort is normal. No respiratory distress.     Breath sounds: Normal breath sounds. No stridor. No wheezing or rales.  Abdominal:     General: Bowel sounds are normal. There is no distension.     Palpations: Abdomen is soft.     Tenderness: There is abdominal tenderness in the right upper quadrant and epigastric area. There is  right CVA tenderness. There is no guarding or rebound.  Musculoskeletal:        General: No tenderness or deformity.     Cervical back: Neck supple.  Skin:    General: Skin is warm and dry.     Findings: No rash.  Neurological:     General: No focal deficit present.     Mental Status: She is alert.     Cranial Nerves: No cranial nerve deficit (no facial droop, extraocular movements intact, no slurred speech).     Sensory: No sensory deficit.     Motor: No abnormal muscle tone or seizure activity.     Coordination: Coordination normal.  Psychiatric:        Mood and Affect: Mood normal.    ED Results / Procedures / Treatments   Labs (all labs ordered are listed, but only abnormal results are displayed) Labs Reviewed  URINALYSIS, ROUTINE W REFLEX MICROSCOPIC - Abnormal; Notable for the following components:      Result Value   Color, Urine STRAW (*)    Specific Gravity, Urine 1.003 (*)    Hgb urine dipstick LARGE (*)    All other components within normal limits  COMPREHENSIVE METABOLIC PANEL  LIPASE, BLOOD  CBC WITH DIFFERENTIAL/PLATELET    EKG None  Radiology CT Renal Stone Study  Result Date: 10/24/2020 CLINICAL DATA:  Right sided flank pain radiating to front of abdomen. EXAM: CT ABDOMEN AND PELVIS WITHOUT CONTRAST TECHNIQUE: Multidetector CT imaging of the abdomen and pelvis was performed following the standard protocol without IV contrast. COMPARISON:  10/19/1999. FINDINGS: Lower chest: No acute abnormality. Hepatobiliary: No focal liver abnormality is seen. Status  post cholecystectomy. No biliary dilatation. Pancreas: Unremarkable. No pancreatic ductal dilatation or surrounding inflammatory changes. Spleen: Normal in size without focal abnormality. Adrenals/Urinary Tract: Adrenal glands are unremarkable. Kidneys are normal, without renal calculi, focal lesion, or hydronephrosis. Bladder is unremarkable. Stomach/Bowel: Stomach is within normal limits. Appendix appears normal.  No evidence of bowel wall thickening, distention, or inflammatory changes. Vascular/Lymphatic: Aortic atherosclerotic calcifications. No aneurysm. No enlarged lymph nodes. Reproductive: Uterus and bilateral adnexa are unremarkable. Other: No abdominal wall hernia or abnormality. No abdominopelvic ascites. Musculoskeletal: No fracture or acute finding. No bone lesion. Grade 1 anterolisthesis of L4 on L5. Bilateral hip joint arthropathic changes, severe on the left and mild-to-moderate on the right. IMPRESSION: 1. No acute findings within the abdomen or pelvis. No renal or ureteral stones or obstructive uropathy. No findings to account for right-sided abdominal pain. 2. Aortic atherosclerosis. Electronically Signed   By: Lajean Manes M.D.   On: 10/24/2020 10:10    Procedures Procedures   Medications Ordered in ED Medications  sodium chloride 0.9 % bolus 1,000 mL (1,000 mLs Intravenous New Bag/Given 10/24/20 0811)    And  0.9 %  sodium chloride infusion ( Intravenous New Bag/Given 10/24/20 0812)  HYDROmorphone (DILAUDID) injection 1 mg (1 mg Intravenous Given 10/24/20 0755)  ondansetron (ZOFRAN) injection 4 mg (4 mg Intravenous Given 10/24/20 0755)    ED Course  I have reviewed the triage vital signs and the nursing notes.  Pertinent labs & imaging results that were available during my care of the patient were reviewed by me and considered in my medical decision making (see chart for details).  Clinical Course as of 10/24/20 1032  Mon Oct 24, 2020  3846 White blood cell count is normal.  Urine does show large amount of blood.  Suggestive of kidney stone.  We will proceed with CT scan [JK]  1014 CT scan without acute findings [JK]    Clinical Course User Index [JK] Dorie Rank, MD   MDM Rules/Calculators/A&P                           Patient presented with acute flank pain.  ED work-up is reassuring.  No signs of leukocytosis to suggest infection.  No hepatitis or pancreatitis noted on her  laboratory test.  Urinalysis did show large amount of hemoglobin.  Patient states she has had that in the past but never has been diagnosed with kidney stones.  CT scan was performed and there is no evidence of ureteral stone.  She also does not have evidence of appendicitis or bowel obstruction.  No acute findings noted on CT scan to account for her pain.  At this point I suspect this may be more of a musculoskeletal etiology.  Will discharge home with pain medications.  Outpatient follow-up with PCP Final Clinical Impression(s) / ED Diagnoses Final diagnoses:  Flank pain  Hematuria, unspecified type    Rx / DC Orders ED Discharge Orders          Ordered    cyclobenzaprine (FLEXERIL) 10 MG tablet  2 times daily PRN        10/24/20 1030    HYDROcodone-acetaminophen (NORCO/VICODIN) 5-325 MG tablet  Every 6 hours PRN        10/24/20 1030             Dorie Rank, MD 10/24/20 1035

## 2020-10-28 ENCOUNTER — Other Ambulatory Visit: Payer: Self-pay | Admitting: Physician Assistant

## 2020-10-31 NOTE — Progress Notes (Signed)
Office Visit Note  Patient: Pamela Livingston             Date of Birth: Jul 03, 1965           MRN: [id number]             PCP: Soyla Dryer, PA-C Referring: Soyla Dryer, PA-C Visit Date: 11/01/2020   Subjective:  Follow-up (Doing fair)   History of Present Illness: Pamela Livingston is a 55 y.o. female here for follow up with osteoarthritis and joint pains. Left thumb has improved partially after local steroid injection and wearing a compressive glove. The cooler weather has increased symptoms somewhat with more pain and stiffness at the thumb. She went for left knee MRI this demonstrated partial thickness chondral defect of the medial trochlea otherwise was unremarkable. Otherwise knee pain and instability is unchanged.   Previous HPI 10/04/20 Pamela Livingston is a 55 y.o. female here for follow up for joint pain in multiple sites at her last visit hand x-rays demonstrated osteoarthritis but the left knee no findings to explain the continued joint pain swelling stiffness and instability.  He has not had any new fall continues to walk with a cane for additional stability.  She continues having intermittent numbness or cold sensation or paresthesias distal to the left knee.  No significant changes in symptoms compared to at our initial visit.    09/20/20 Pamela Livingston is a 55 y.o. 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 She saw vascular surgery with unremarkable workup including doppler ultrasound, ABIs, and abdominal CT angiogram.  She describes left leg symptoms started since about 1 year ago with pain pretty much the entire left leg worst at the level of the knee.  Also new development of a persistent livedo reticularis rash pattern on the distal left leg.  There is some weakness raising the leg and gait instability noted using a cane for support since about 6 months ago.  She has developed some intermittent numbness of  the toes on the left foot but none elsewhere.  There is intermittent swelling of the left knee this was never aspirated and no recent joint imaging.  Since about 6 months ago describes worsening pain in her bilateral thumbs and bilateral elbows with some intermittent swelling at the left elbow.  She has taken prednisone off and on for many years due to her COPD.  Trial of gabapentin was partially beneficial for symptoms.  Otherwise no particular treatments at this time. She has a history of gastritis negative for H. pylori on endoscopy biopsies. She denies skin rashes elsewhere on the body. Only swelling areas are left foot, left knee, and left elbow intermittently. No falls or injuries although she feels unsteady walking with cane currently.   Labs reviewed 06/2020 ANA neg RF neg ESR 55 CRP 0.7 CBC wnl CMP unremarkable   Review of Systems  Constitutional:  Positive for fatigue.  HENT:  Negative for mouth dryness.   Eyes:  Negative for dryness.  Respiratory:  Positive for shortness of breath.   Cardiovascular:  Positive for swelling in legs/feet.  Gastrointestinal:  Negative for constipation.  Endocrine: Positive for excessive thirst.  Genitourinary:  Negative for difficulty urinating.  Musculoskeletal:  Positive for joint pain, gait problem, joint pain, joint swelling, muscle weakness, morning stiffness and muscle tenderness.  Skin:  Negative for rash.  Allergic/Immunologic: Positive for susceptible to infections.  Neurological:  Positive for numbness and  weakness.  Hematological:  Positive for bruising/bleeding tendency.  Psychiatric/Behavioral:  Positive for sleep disturbance.    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   Pain in left leg 09/20/2020   Esophageal dysphagia 09/30/2019   RUQ pain 05/27/2019   GERD (gastroesophageal reflux disease) 05/27/2019   Constipation 05/27/2019    Rectal bleeding 05/27/2019   Cigarette nicotine dependence with nicotine-induced disorder 04/15/2015   Chronic obstructive pulmonary disease ([place]) 04/15/2015   Hyperlipidemia 04/15/2015   Abnormal mammogram 04/15/2015   Hematuria 03/07/2015   Abdominal pain, epigastric 03/07/2015   Cigarette nicotine dependence, uncomplicated 10/93/2355   History of substance abuse ([place]) 03/07/2015   Esophageal reflux 03/07/2015    Past Medical History:  Diagnosis Date   Asthma    Chronic pelvic pain in female    COPD (chronic obstructive pulmonary disease) ([place])    Endometriosis    Polysubstance abuse ([place])    marijuana, cocaine, benzos   Stomach ulcer    Suicide attempt ([place]) 2008   by phenergan overdose   Syncope     Family History  Problem Relation Age of Onset   Heart disease Mother    Hypertension Mother    Diabetes Mother    Rheum arthritis Mother    Heart disease Father    Hypertension Father    Heart attack Sister    Stroke Sister    Dementia Sister    Heart attack Maternal Uncle    Heart attack Maternal Uncle    COPD Paternal Grandfather    Colon cancer Neg Hx    Past Surgical History:  Procedure Laterality Date   ABDOMINAL HYSTERECTOMY     BALLOON DILATION N/A 07/26/2020   Procedure: BALLOON DILATION;  Surgeon: Eloise Harman, DO;  [facility];  Service: Endoscopy;  Laterality: N/A;   BIOPSY  07/26/2020   Procedure: BIOPSY;  Surgeon: Eloise Harman, DO;  [facility];  Service: Endoscopy;;   CERVICAL CONE BIOPSY     cervical cancer   CESAREAN SECTION  1993   CHOLECYSTECTOMY     COLONOSCOPY WITH PROPOFOL N/A 07/26/2020   Procedure: COLONOSCOPY WITH PROPOFOL;  Surgeon: Eloise Harman, DO;  [facility]: [facility];  Service: Endoscopy;  Laterality: N/A;  12:00pm   ESOPHAGOGASTRODUODENOSCOPY (EGD) WITH PROPOFOL N/A 07/26/2020   Procedure: ESOPHAGOGASTRODUODENOSCOPY (EGD) WITH PROPOFOL;  Surgeon: Eloise Harman, DO;  [facility];   Service: Endoscopy;  Laterality: N/A;   RADICAL HYSTERECTOMY WITH TRANSPOSITION OF OVARIES     TOOTH EXTRACTION     TUBAL LIGATION  1993   Social History   Social History Narrative   Not on file   Immunization History  Administered Date(s) Administered   Influenza,inj,Quad PF,6+ Mos 11/03/2017   Moderna Sars-Covid-2 Vaccination 05/07/2019, 06/04/2019     Objective: Vital Signs: BP 103/66 (BP Location: Left Arm, Patient Position: Sitting, Cuff Size: Normal)   Pulse 92   Resp 16   Ht _0  (1.499 m)   Wt 165 lb (74.8 kg)   BMI 33.33 kg/m    Physical Exam Neurological:     Mental Status: She is alert.  Psychiatric:        Mood and Affect: Mood normal.     Musculoskeletal Exam:  Elbows full ROM no tenderness or swelling Wrists full ROM no tenderness or swelling 1st CMC joint tenderness, no palpable synovitis, Left knee joint line  tenderness to palpation, pain with full flexion, no palpable effusion   Investigation: No additional findings.  Imaging: MR KNEE LEFT WO CONTRAST  Result Date: 10/20/2020 CLINICAL DATA:  Anterior left knee pain for several months. No known injury. EXAM: MRI OF THE LEFT KNEE WITHOUT CONTRAST TECHNIQUE: Multiplanar, multisequence MR imaging of the knee was performed. No intravenous contrast was administered. COMPARISON:  X-ray 09/20/2020 FINDINGS: MENISCI Medial meniscus:  Intact. Lateral meniscus:  Intact. LIGAMENTS Cruciates:  Intact ACL and PCL. Collaterals: Medial collateral ligament is intact. Lateral collateral ligament complex is intact. CARTILAGE Patellofemoral: Partial-thickness chondral surface irregularity within the inferior aspect of the medial trochlea (series 13, image 16). No patellar chondral defect. Medial:  No chondral defect. Lateral:  No chondral defect. Joint:  No joint effusion.  Minimal edema within Hoffa's fat. Popliteal Fossa:  No Baker cyst. Intact popliteus tendon. Extensor Mechanism:  Intact quadriceps tendon and patellar  tendon. Bones: No focal marrow signal abnormality. No fracture or dislocation. Other: No soft tissue edema or fluid collection. IMPRESSION: 1. Partial-thickness chondral surface irregularity within the inferior aspect of the medial trochlea. 2. Intact menisci. Intact cruciate and collateral ligaments. Electronically Signed   By: Davina Poke D.O.   On: 10/20/2020 16:53   MS DIGITAL SCREENING TOMO BILATERAL  Result Date: 10/14/2020 CLINICAL DATA:  Screening. EXAM: DIGITAL SCREENING BILATERAL MAMMOGRAM WITH TOMOSYNTHESIS AND CAD TECHNIQUE: Bilateral screening digital craniocaudal and mediolateral oblique mammograms were obtained. Bilateral screening digital breast tomosynthesis was performed. The images were evaluated with computer-aided detection. COMPARISON:  Previous exam(s). ACR Breast Density Category a: The breast tissue is almost entirely fatty. FINDINGS: There are no findings suspicious for malignancy. IMPRESSION: No mammographic evidence of malignancy. A result letter of this screening mammogram will be mailed directly to the patient. RECOMMENDATION: Screening mammogram in one year. (Code:SM-B-01Y) BI-RADS CATEGORY  1: Negative. Electronically Signed   By: Lajean Manes M.D.   On: 10/14/2020 12:50   CT Renal Stone Study  Result Date: 10/24/2020 CLINICAL DATA:  Right sided flank pain radiating to front of abdomen. EXAM: CT ABDOMEN AND PELVIS WITHOUT CONTRAST TECHNIQUE: Multidetector CT imaging of the abdomen and pelvis was performed following the standard protocol without IV contrast. COMPARISON:  10/19/1999. FINDINGS: Lower chest: No acute abnormality. Hepatobiliary: No focal liver abnormality is seen. Status post cholecystectomy. No biliary dilatation. Pancreas: Unremarkable. No pancreatic ductal dilatation or surrounding inflammatory changes. Spleen: Normal in size without focal abnormality. Adrenals/Urinary Tract: Adrenal glands are unremarkable. Kidneys are normal, without renal calculi, focal  lesion, or hydronephrosis. Bladder is unremarkable. Stomach/Bowel: Stomach is within normal limits. Appendix appears normal. No evidence of bowel wall thickening, distention, or inflammatory changes. Vascular/Lymphatic: Aortic atherosclerotic calcifications. No aneurysm. No enlarged lymph nodes. Reproductive: Uterus and bilateral adnexa are unremarkable. Other: No abdominal wall hernia or abnormality. No abdominopelvic ascites. Musculoskeletal: No fracture or acute finding. No bone lesion. Grade 1 anterolisthesis of L4 on L5. Bilateral hip joint arthropathic changes, severe on the left and mild-to-moderate on the right. IMPRESSION: 1. No acute findings within the abdomen or pelvis. No renal or ureteral stones or obstructive uropathy. No findings to account for right-sided abdominal pain. 2. Aortic atherosclerosis. Electronically Signed   By: Lajean Manes M.D.   On: 10/24/2020 10:10    Recent Labs: Lab Results  Component Value Date   WBC 8.0 10/24/2020   HGB 13.8 10/24/2020   PLT 352 10/24/2020   NA 137 10/24/2020   K 3.8 10/24/2020   CL 106 10/24/2020   CO2  23 10/24/2020   GLUCOSE 87 10/24/2020   BUN 12 10/24/2020   CREATININE 0.76 10/24/2020   BILITOT 0.7 10/24/2020   ALKPHOS 88 10/24/2020   AST 15 10/24/2020   ALT 12 10/24/2020   PROT 7.6 10/24/2020   ALBUMIN 4.3 10/24/2020   CALCIUM 9.5 10/24/2020   GFRAA >60 08/03/2019    Speciality Comments: No specialty comments available.  Procedures:  No procedures performed Allergies: Asa [aspirin] and Codeine   Assessment / Plan:     Visit Diagnoses: Pain in left leg  Continued left leg pain MRI with no findings suggestive for inflammatory changes along with her negative laboratory work-up.  There is some reported chondral defect probably longstanding.  I do not think this is likely to benefit from systemic immunosuppression or anti-inflammatories. I recommend her to follow-up with orthopedic surgery clinic for further evaluation of  this.  Possible treatments to include physical therapy versus local injection treatments versus surgical procedure. She already has an orthopedist in [place] so no referral to new provider at this time.  Livedo reticularis  Negative labs or clinical history for vasculitis or coagulability defect so no particular treatment recommended unless develops some lesions or ulcers or other complication.  Bilateral hand pain  Osteoarthritis of the hands somewhat improved with compressive glove use and discussed continued [place] also cold avoidance for symptom exacerbations.  Orders: No orders of the defined types were placed in this encounter.  No orders of the defined types were placed in this encounter.    Follow-Up Instructions: No follow-ups on file.   Collier Salina, MD  Note - This record has been created using [employer].  Chart creation errors have been sought, but may not always  have been located. Such creation errors do not reflect on  the standard of medical care.

## 2020-11-01 ENCOUNTER — Ambulatory Visit: Payer: Self-pay | Admitting: Physician Assistant

## 2020-11-01 ENCOUNTER — Encounter: Payer: Self-pay | Admitting: Internal Medicine

## 2020-11-01 ENCOUNTER — Other Ambulatory Visit: Payer: Self-pay

## 2020-11-01 ENCOUNTER — Ambulatory Visit (INDEPENDENT_AMBULATORY_CARE_PROVIDER_SITE_OTHER): Payer: No Typology Code available for payment source | Admitting: Internal Medicine

## 2020-11-01 VITALS — BP 103/66 | HR 92 | Resp 16 | Ht 59.0 in | Wt 165.0 lb

## 2020-11-01 DIAGNOSIS — M79641 Pain in right hand: Secondary | ICD-10-CM

## 2020-11-01 DIAGNOSIS — J069 Acute upper respiratory infection, unspecified: Secondary | ICD-10-CM

## 2020-11-01 DIAGNOSIS — M79605 Pain in left leg: Secondary | ICD-10-CM

## 2020-11-01 DIAGNOSIS — M79642 Pain in left hand: Secondary | ICD-10-CM

## 2020-11-01 DIAGNOSIS — R231 Pallor: Secondary | ICD-10-CM

## 2020-11-01 MED ORDER — AMOXICILLIN 500 MG PO CAPS: 500.0000 mg | ORAL_CAPSULE | Freq: Three times a day (TID) | ORAL | 0 refills | Status: AC

## 2020-11-01 NOTE — Progress Notes (Signed)
   There were no vitals taken for this visit.   Subjective:    Patient ID: Pamela Livingston, female    DOB: 04-22-1965, 55 y.o.   MRN: [id number]  HPI: Pamela Livingston is a 55 y.o. female presenting on 11/01/2020 for No chief complaint on file.   HPI  This is a telemedicine appointment through [employer]  I connected with  Pamela Livingston on 11/01/20 by a video enabled telemedicine application and verified that I am speaking with the correct person using two identifiers.   I discussed the limitations of evaluation and management by telemedicine. The patient expressed understanding and agreed to proceed.  Pt is at home.  Provider is at office.   Pt c/o sinus infectuion that started end of last week.  She has  cough, mucus and some sob.  She has had 2 negative covid tests (one yesterday, one today).  She has been using her nebulizer for the sob which helps.   She says Most days doesn't smoke but she has not stopped altogether.    Relevant past medical, surgical, family and social history reviewed and updated as indicated. Interim medical history since our last visit reviewed. Allergies and medications reviewed and updated.  Review of Systems  Per HPI unless specifically indicated above     Objective:    There were no vitals taken for this visit.  Wt Readings from Last 3 Encounters:  10/24/20 167 lb 12.8 oz (76.1 kg)  10/04/20 167 lb 12.8 oz (76.1 kg)  09/20/20 168 lb 3.2 oz (76.3 kg)    Physical Exam Constitutional:      General: She is not in acute distress.    Appearance: She is not toxic-appearing.  HENT:     Head: Normocephalic and atraumatic.  Pulmonary:     Effort: No respiratory distress.     Comments: Pt is talking in complete sentences wtihout dyspnea Neurological:     Mental Status: She is alert and oriented to person, place, and time.  Psychiatric:        Attention and Perception: Attention normal.        Speech: Speech normal.        Behavior: Behavior normal.           Assessment & Plan:    Encounter Diagnosis  Name Primary?   Upper respiratory tract infection, unspecified type Yes     -pt is Rx amoxil.  She is to continue to use her nebulizer as needed.  She is encouraged to avoid smoking -encouraged rest, fluids, OTCs as needed -pt to contact office is persists or for new symptoms or worsening

## 2020-11-02 ENCOUNTER — Encounter: Payer: Self-pay | Admitting: Physician Assistant

## 2021-01-18 ENCOUNTER — Ambulatory Visit: Payer: Self-pay | Admitting: Physician Assistant

## 2021-01-18 ENCOUNTER — Other Ambulatory Visit: Payer: Self-pay | Admitting: Physician Assistant

## 2021-01-18 DIAGNOSIS — E785 Hyperlipidemia, unspecified: Secondary | ICD-10-CM

## 2021-01-18 DIAGNOSIS — J069 Acute upper respiratory infection, unspecified: Secondary | ICD-10-CM

## 2021-01-18 DIAGNOSIS — I10 Essential (primary) hypertension: Secondary | ICD-10-CM

## 2021-01-18 NOTE — Progress Notes (Signed)
° °  There were no vitals taken for this visit.   Subjective:    Patient ID: Pamela Livingston, female    DOB: 1965-02-27, 55 y.o.   MRN: [id number]  HPI: Pamela Livingston is a 55 y.o. female presenting on 01/18/2021 for No chief complaint on file.   HPI  This is a telemedicine appointment through [employer]  I connected with  Pamela Livingston on 01/18/21 by a video enabled telemedicine application and verified that I am speaking with the correct person using two identifiers.   I discussed the limitations of evaluation and management by telemedicine. The patient expressed understanding and agreed to proceed.  Pt is at home.  Provider is at office.    Pt started having sinus pressure left face that started on Monday (2 days ago).  He is also having some non-productive cough.  She does not have a fever.  She is not having any problems with breathing.  She says she took 2 covid tests that were both negative.   She is using claritin-d.    Relevant past medical, surgical, family and social history reviewed and updated as indicated. Interim medical history since our last visit reviewed. Allergies and medications reviewed and updated.  Review of Systems  Per HPI unless specifically indicated above     Objective:    There were no vitals taken for this visit.  Wt Readings from Last 3 Encounters:  11/01/20 165 lb (74.8 kg)  10/24/20 167 lb 12.8 oz (76.1 kg)  10/04/20 167 lb 12.8 oz (76.1 kg)    Physical Exam Constitutional:      General: She is not in acute distress.    Appearance: She is not ill-appearing or toxic-appearing.  HENT:     Head: Normocephalic and atraumatic.  Pulmonary:     Effort: Pulmonary effort is normal. No respiratory distress.     Comments: Pt is speaking in complete sentences without dyspnea Neurological:     Mental Status: She is alert and oriented to person, place, and time.  Psychiatric:        Attention and Perception: Attention normal.        Speech: Speech normal.         Behavior: Behavior normal. Behavior is cooperative.          Assessment & Plan:   Encounter Diagnosis  Name Primary?   Upper respiratory tract infection, unspecified type Yes      Pt encouraged to use some mucinex, drink plenty of water, use nasal steroid, avoid smoking.  She is to contact office for worsening, new symptoms, or if not improving next week.  Pt agrees with pt

## 2021-01-24 ENCOUNTER — Ambulatory Visit: Payer: Self-pay | Admitting: Physician Assistant

## 2021-01-24 DIAGNOSIS — R112 Nausea with vomiting, unspecified: Secondary | ICD-10-CM

## 2021-01-24 MED ORDER — PROMETHAZINE HCL 25 MG RE SUPP: 25.0000 mg | Freq: Four times a day (QID) | RECTAL | 0 refills | Status: DC | PRN

## 2021-01-24 MED ORDER — PROMETHAZINE HCL 25 MG PO TABS: 25.0000 mg | ORAL_TABLET | Freq: Three times a day (TID) | ORAL | 0 refills | Status: DC | PRN

## 2021-01-24 NOTE — Progress Notes (Signed)
° °  There were no vitals taken for this visit.   Subjective:    Patient ID: Pamela Livingston, female    DOB: March 13, 1965, 56 y.o.   MRN: [id number]  HPI: Pamela Livingston is a 57 y.o. female presenting on 01/24/2021 for No chief complaint on file.   HPI   This is a telemedicine appoitment.  It is via telephone due to pt has poor reception inside her home and she doesn't feel well enough to stand outside.  I connected with  Pamela Livingston on 01/24/21 by a video enabled telemedicine application and verified that I am speaking with the correct person using two identifiers.   I discussed the limitations of evaluation and management by telemedicine. The patient expressed understanding and agreed to proceed.  Pt is at home.  Provider is at office.    Pt is sick with vomiting.  She says she got well from where she had URI last week.  The emesis started yesterday.  She says she can't even keep down water.  She denies abdominal pain, diarrhea, coughing or sob.  She has not checked her temperature but says she does have some chills and feels hot and then cold.  She has not been in contact with others having emesis.     Relevant past medical, surgical, family and social history reviewed and updated as indicated. Interim medical history since our last visit reviewed. Allergies and medications reviewed and updated.  Review of Systems  Per HPI unless specifically indicated above     Objective:    There were no vitals taken for this visit.  Wt Readings from Last 3 Encounters:  11/01/20 165 lb (74.8 kg)  10/24/20 167 lb 12.8 oz (76.1 kg)  10/04/20 167 lb 12.8 oz (76.1 kg)    Physical Exam Pulmonary:     Effort: No respiratory distress.     Comments: Pt is talking in complete sentences without dyspnea Neurological:     Mental Status: She is alert and oriented to person, place, and time.  Psychiatric:        Attention and Perception: Attention normal.        Speech: Speech normal.        Behavior:  Behavior is cooperative.          Assessment & Plan:   Encounter Diagnosis  Name Primary?   Nausea and vomiting, unspecified vomiting type Yes      -Rx phenergan suppositories and tablets.  Pt is counseled to avoid driving due to phenergan causing sedation -she is encouraged to gently push sips of fluids.  Discussed drinking clear liquids like water, ginger-ale.  Pt to avoid dairy.    If she is improving tomorrow she can gradually advance bland diet -pt to contact office or go to ER if unable to keep down fluids despite the phenergan

## 2021-01-26 ENCOUNTER — Other Ambulatory Visit: Payer: Self-pay | Admitting: Physician Assistant

## 2021-02-06 ENCOUNTER — Ambulatory Visit: Payer: Self-pay | Admitting: Physician Assistant

## 2021-02-06 ENCOUNTER — Encounter: Payer: Self-pay | Admitting: Physician Assistant

## 2021-02-06 ENCOUNTER — Other Ambulatory Visit: Payer: Self-pay

## 2021-02-06 VITALS — BP 124/86 | HR 100 | Temp 97.1°F

## 2021-02-06 DIAGNOSIS — J449 Chronic obstructive pulmonary disease, unspecified: Secondary | ICD-10-CM

## 2021-02-06 DIAGNOSIS — M25562 Pain in left knee: Secondary | ICD-10-CM

## 2021-02-06 DIAGNOSIS — G8929 Other chronic pain: Secondary | ICD-10-CM

## 2021-02-06 DIAGNOSIS — E785 Hyperlipidemia, unspecified: Secondary | ICD-10-CM

## 2021-02-06 DIAGNOSIS — F17200 Nicotine dependence, unspecified, uncomplicated: Secondary | ICD-10-CM

## 2021-02-06 DIAGNOSIS — I10 Essential (primary) hypertension: Secondary | ICD-10-CM

## 2021-02-06 NOTE — Progress Notes (Signed)
BP 124/86    Pulse 100    Temp (!) 97.1 F (36.2 C)    SpO2 96%    Subjective:    Patient ID: Pamela Livingston, female    DOB: 07/25/1965, 56 y.o.   MRN: [id number]  HPI: Pamela Livingston is a 56 y.o. female presenting on 02/06/2021 for No chief complaint on file.   HPI  Chief Complaint  Patient presents with   Hypertension   Hyperlipidemia   COPD     Pt Didn't get her labs drawn.  She has been busy and not feeling well.    She Had 4 teeth pulled last Monday; she says she is now in debt over that.($925).   she is on augmentin for the teeth  she says her N/V resolved (seen for that earlier this month).  She says she is Not doing great at getting around/ambulating- left knee.  She has not seen orthopedics for her knee pain.  She was evaluated by rheumatology for the knee and had MRI.    She says her [employer] assistance is good until march  sheE got covid booster  She is still smoking but says mostly in the morning with her coffee.     Relevant past medical, surgical, family and social history reviewed and updated as indicated. Interim medical history since our last visit reviewed. Allergies and medications reviewed and updated.   Current Outpatient Medications:    acetaminophen (TYLENOL) 500 MG tablet, Take 1,500 mg by mouth every 6 (six) hours as needed for moderate pain., Disp: , Rfl:    albuterol (PROVENTIL) (2.5 MG/3ML) 0.083% nebulizer solution, INHALE 1 VIAL VIA NEBULIZER EVERY 6 HOURS AS NEEDED FOR WHEEZING OR SHORTNESS OF BREATH, Disp: 225 mL, Rfl: 0   albuterol (VENTOLIN HFA) 108 (90 Base) MCG/ACT inhaler, INHALE 2 PUFFS BY MOUTH EVERY 6 HOURS AS NEEDED FOR COUGHING, WHEEZING, OR SHORTNESS OF BREATH, Disp: 20.1 g, Rfl: 0   amoxicillin-clavulanate (AUGMENTIN) 875-125 MG tablet, Take 1 tablet by mouth 2 (two) times daily., Disp: , Rfl:    atorvastatin (LIPITOR) 20 MG tablet, TAKE 1 Tablet BY MOUTH ONCE DAILY, Disp: 90 tablet, Rfl: 0   diphenhydrAMINE  (BENADRYL) 25 MG tablet, Take 50 mg by mouth at bedtime., Disp: , Rfl:    fluticasone-salmeterol (ADVAIR) 100-50 MCG/ACT AEPB, INHALE 1 PUFF BY MOUTH TWICE DAILY. RINSE MOUTH AFTER EACH USE. (EVERY MORNING AND EVERY NIGHT AT BEDTIME), Disp: 180 each, Rfl: 0   guaiFENesin (MUCINEX) 600 MG 12 hr tablet, Take by mouth 2 (two) times daily., Disp: , Rfl:    lisinopril (ZESTRIL) 20 MG tablet, TAKE 1 Tablet BY MOUTH ONCE DAILY, Disp: 90 tablet, Rfl: 0   loratadine-pseudoephedrine (CLARITIN-D 24-HOUR) 10-240 MG 24 hr tablet, Take 1 tablet by mouth in the morning. , Disp: , Rfl:    montelukast (SINGULAIR) 10 MG tablet, TAKE 1 Tablet BY MOUTH ONCE DAILY AS NEEDED, Disp: 90 tablet, Rfl: 0   omeprazole (PRILOSEC) 40 MG capsule, TAKE 1 Capsule  BY MOUTH TWICE DAILY, Disp: 180 capsule, Rfl: 0   cyclobenzaprine (FLEXERIL) 10 MG tablet, Take 1 tablet (10 mg total) by mouth 2 (two) times daily as needed for muscle spasms. (Patient not taking: Reported on 02/06/2021), Disp: 20 tablet, Rfl: 0   gabapentin (NEURONTIN) 100 MG capsule, Take 1 capsule (100 mg total) by mouth 2 (two) times daily as needed. (Patient not taking: Reported on 02/06/2021), Disp: 60 capsule, Rfl: 1   HYDROcodone-acetaminophen (NORCO/VICODIN) 5-325 MG  tablet, Take 1 tablet by mouth every 6 (six) hours as needed. (Patient not taking: Reported on 11/01/2020), Disp: 12 tablet, Rfl: 0   nitroGLYCERIN (NITROSTAT) 0.4 MG SL tablet, Place 1 tablet (0.4 mg total) under the tongue every 5 (five) minutes as needed for chest pain. (Patient not taking: Reported on 02/06/2021), Disp: 25 tablet, Rfl: prn   Sodium Sulfate-Mag Sulfate-KCl (SUTAB) [phone] MG TABS, Take as directed (Patient not taking: Reported on 10/03/2020), Disp: 24 tablet, Rfl: 0    Review of Systems  Per HPI unless specifically indicated above     Objective:    BP 124/86    Pulse 100    Temp (!) 97.1 F (36.2 C)    SpO2 96%   Wt Readings from Last 3 Encounters:  11/01/20 165 lb (74.8  kg)  10/24/20 167 lb 12.8 oz (76.1 kg)  10/04/20 167 lb 12.8 oz (76.1 kg)    Physical Exam Vitals reviewed.  Constitutional:      General: She is not in acute distress.    Appearance: She is well-developed. She is not ill-appearing.  HENT:     Head: Normocephalic and atraumatic.     Mouth/Throat:     Dentition: Abnormal dentition. Dental caries present.     Comments: Many teeth missing.  Broken tooth left lower/molar.  No abscess.  Cardiovascular:     Rate and Rhythm: Normal rate and regular rhythm.  Pulmonary:     Effort: Pulmonary effort is normal.     Breath sounds: Normal breath sounds.  Abdominal:     General: Bowel sounds are normal.     Palpations: Abdomen is soft. There is no mass.     Tenderness: There is no abdominal tenderness.  Musculoskeletal:     Cervical back: Neck supple.     Left knee: No erythema. Normal range of motion. Tenderness present.     Right lower leg: No edema.     Left lower leg: No edema.     Comments: Tenderness left knee, medially and laterally.  No tenderness A&P.  No instability.   Lymphadenopathy:     Cervical: No cervical adenopathy.  Skin:    General: Skin is warm and dry.  Neurological:     Mental Status: She is alert and oriented to person, place, and time.  Psychiatric:        Behavior: Behavior normal.          Assessment & Plan:    Encounter Diagnoses  Name Primary?   Essential hypertension Yes   Hyperlipidemia, unspecified hyperlipidemia type    Chronic obstructive pulmonary disease, unspecified COPD type ([place])    Chronic pain of left knee    Tobacco use disorder      -Dental list -refer to Orthopedist for knee.  Discussed with pt that PT may be best treatment for the knee but will have ortho eval for input -Pt to get labs drawn.  She will be called with results -HCM:  mammogram and colon cancer screening UTD.  S/p hyst -encouraged smoking cessation -pt to follow up 3 months.  She is to contact office sooner prn

## 2021-02-23 ENCOUNTER — Ambulatory Visit (INDEPENDENT_AMBULATORY_CARE_PROVIDER_SITE_OTHER): Payer: Self-pay | Admitting: Orthopedic Surgery

## 2021-02-23 ENCOUNTER — Encounter: Payer: Self-pay | Admitting: Orthopedic Surgery

## 2021-02-23 ENCOUNTER — Other Ambulatory Visit: Payer: Self-pay

## 2021-02-23 ENCOUNTER — Ambulatory Visit: Payer: Self-pay

## 2021-02-23 VITALS — BP 140/99 | HR 99 | Ht 59.0 in | Wt 159.6 lb

## 2021-02-23 DIAGNOSIS — M25562 Pain in left knee: Secondary | ICD-10-CM

## 2021-02-23 DIAGNOSIS — G8929 Other chronic pain: Secondary | ICD-10-CM

## 2021-02-23 DIAGNOSIS — M222X1 Patellofemoral disorders, right knee: Secondary | ICD-10-CM

## 2021-02-23 DIAGNOSIS — M5442 Lumbago with sciatica, left side: Secondary | ICD-10-CM

## 2021-02-23 MED ORDER — MELOXICAM 7.5 MG PO TABS: 7.5000 mg | ORAL_TABLET | Freq: Every day | ORAL | 5 refills | Status: DC

## 2021-02-23 NOTE — Addendum Note (Signed)
Addended by: Obie Dredge A on: 02/23/2021 02:15 PM   Modules accepted: Orders

## 2021-02-23 NOTE — Progress Notes (Signed)
FOLLOW UP   Encounter Diagnosis  Name Primary?   Left knee pain, unspecified chronicity Yes     Chief Complaint  Patient presents with   Knee Pain    Left /no known injury/painful for a couple of years but has recently gotten worse     56 year old female with painful left knee for several years she is already had an MRI which showed cartilage loss in the patellofemoral joint she comes in with complaints of pain and inability to stand for long periods of time associated with leg pain requiring her to use a walking device  She does admit to having some back pain and prior x-rays show she has a L4-5 spondylolisthesis.  She has not had any treatment for her knee pain she has had some Flexeril and some hydrocodone and Tylenol which have not helped her knee   Past Medical History:  Diagnosis Date   Asthma    Chronic pelvic pain in female    COPD (chronic obstructive pulmonary disease) (HCC)    Endometriosis    Polysubstance abuse ([place])    marijuana, cocaine, benzos   Stomach ulcer    Suicide attempt ([place]) 2008   by phenergan overdose   Syncope    BP (!) 140/99    Pulse 99    Ht 4\' 11"  (1.499 m)    Wt 159 lb 9.6 oz (72.4 kg)    BMI 32.24 kg/m   Physical Exam Constitutional:      General: She is not in acute distress.    Appearance: Normal appearance. She is well-developed.     Comments: Well developed, well nourished Normal grooming and hygiene     Cardiovascular:     Comments: No peripheral edema Skin:    General: Skin is warm and dry.  Neurological:     Mental Status: She is alert and oriented to person, place, and time.     Sensory: No sensory deficit.     Coordination: Coordination normal.     Gait: Gait normal.     Deep Tendon Reflexes: Reflexes are normal and symmetric.  Psychiatric:        Mood and Affect: Mood normal.        Behavior: Behavior normal.        Thought Content: Thought content normal.        Judgment: Judgment normal.     Comments: Affect  normal     Left knee is noted to have small effusion she has a stable Lachman test no collateral ligament instability no tenderness on the joint lines but peripatellar irritation.  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   Left knee pain, unspecified chronicity Yes   Patellofemoral disorders, right knee    Chronic left-sided low back pain with left-sided sciatica      Recommend  NSAID ?  Meds ordered this encounter  Medications   meloxicam (MOBIC) 7.5 MG tablet    Sig: Take 1 tablet (7.5 mg total) by mouth daily.    Dispense:  30 tablet  Refill:  5     PT for lumbar spine 1-2 visits as the patient is on: Financial assistance  Recommend injection of the left knee no surgical indications at this time Strengthening exercises for the knee as well

## 2021-04-12 ENCOUNTER — Other Ambulatory Visit: Payer: Self-pay | Admitting: Physician Assistant

## 2021-04-12 DIAGNOSIS — E785 Hyperlipidemia, unspecified: Secondary | ICD-10-CM

## 2021-04-12 DIAGNOSIS — I10 Essential (primary) hypertension: Secondary | ICD-10-CM

## 2021-04-27 ENCOUNTER — Ambulatory Visit: Payer: Self-pay | Admitting: Physician Assistant

## 2021-04-27 ENCOUNTER — Encounter: Payer: Self-pay | Admitting: Physician Assistant

## 2021-04-27 DIAGNOSIS — F17200 Nicotine dependence, unspecified, uncomplicated: Secondary | ICD-10-CM

## 2021-04-27 DIAGNOSIS — J441 Chronic obstructive pulmonary disease with (acute) exacerbation: Secondary | ICD-10-CM

## 2021-04-27 DIAGNOSIS — J069 Acute upper respiratory infection, unspecified: Secondary | ICD-10-CM

## 2021-04-27 MED ORDER — PREDNISONE 10 MG (21) PO TBPK
ORAL_TABLET | ORAL | 0 refills | Status: DC
Start: 2021-04-27 — End: 2021-07-18

## 2021-04-27 NOTE — Progress Notes (Signed)
? ?There were no vitals taken for this visit.  ? ?Subjective:  ? ? Patient ID: Pamela Livingston, female    DOB: 12-12-1965, 56 y.o.   MRN: [id number] ? ?HPI: ?Pamela Livingston is a 56 y.o. female presenting on 04/27/2021 for No chief complaint on file. ? ? ?HPI ? ? ?This is a telemedicine appointment through [employer]. ? ?I connected with  Pamela Livingston on 04/27/21 by a video enabled telemedicine application and verified that I am speaking with the correct person using two identifiers. ?  ?I discussed the limitations of evaluation and management by telemedicine. The patient expressed understanding and agreed to proceed. ? ?Pt is at home.  Provider is at office.  ? ? ? ?Pt says she has been sick since the weekend.  She complains of lots of head congestion and pressure.  She has been having more wheezing and has been using nebulizers four times daily.  She has had no fevers.  She is smoking more recently due to stress issues.  She is using claritin-d, mucinex and tylenol and her advair.  She took 2 covid tests both of which were negative.  No one else at home is sick.   ? ? ?Relevant past medical, surgical, family and social history reviewed and updated as indicated. Interim medical history since our last visit reviewed. ?Allergies and medications reviewed and updated. ? ? ?Current Outpatient Medications:  ?  albuterol (PROVENTIL) (2.5 MG/3ML) 0.083% nebulizer solution, INHALE 1 VIAL VIA NEBULIZER EVERY 6 HOURS AS NEEDED FOR WHEEZING OR SHORTNESS OF BREATH, Disp: 225 mL, Rfl: 0 ?  albuterol (VENTOLIN HFA) 108 (90 Base) MCG/ACT inhaler, INHALE 2 PUFFS BY MOUTH EVERY 6 HOURS AS NEEDED FOR COUGHING, WHEEZING, OR SHORTNESS OF BREATH, Disp: 20.1 g, Rfl: 0 ?  fluticasone-salmeterol (ADVAIR) 100-50 MCG/ACT AEPB, INHALE 1 PUFF BY MOUTH TWICE DAILY (EVERY 12 HOURS). RINSE MOUTH AFTER EACH USE. (EVERY MORNING AND EVERY NIGHT AT BEDTIME), Disp: 180 each, Rfl: 0 ?  guaiFENesin (MUCINEX) 600 MG 12 hr tablet, Take by mouth 2 (two) times daily., Disp:  , Rfl:  ?  acetaminophen (TYLENOL) 500 MG tablet, Take 1,500 mg by mouth every 6 (six) hours as needed for moderate pain., Disp: , Rfl:  ?  atorvastatin (LIPITOR) 20 MG tablet, TAKE 1 Tablet BY MOUTH ONCE DAILY, Disp: 90 tablet, Rfl: 0 ?  cyclobenzaprine (FLEXERIL) 10 MG tablet, Take 1 tablet (10 mg total) by mouth 2 (two) times daily as needed for muscle spasms., Disp: 20 tablet, Rfl: 0 ?  diphenhydrAMINE (BENADRYL) 25 MG tablet, Take 50 mg by mouth at bedtime., Disp: , Rfl:  ?  gabapentin (NEURONTIN) 100 MG capsule, Take 1 capsule (100 mg total) by mouth 2 (two) times daily as needed., Disp: 60 capsule, Rfl: 1 ?  HYDROcodone-acetaminophen (NORCO/VICODIN) 5-325 MG tablet, Take 1 tablet by mouth every 6 (six) hours as needed., Disp: 12 tablet, Rfl: 0 ?  lisinopril (ZESTRIL) 20 MG tablet, TAKE 1 Tablet BY MOUTH ONCE DAILY, Disp: 90 tablet, Rfl: 0 ?  loratadine-pseudoephedrine (CLARITIN-D 24-HOUR) 10-240 MG 24 hr tablet, Take 1 tablet by mouth in the morning. , Disp: , Rfl:  ?  meloxicam (MOBIC) 7.5 MG tablet, Take 1 tablet (7.5 mg total) by mouth daily., Disp: 30 tablet, Rfl: 5 ?  montelukast (SINGULAIR) 10 MG tablet, TAKE 1 Tablet BY MOUTH ONCE DAILY AS NEEDED, Disp: 90 tablet, Rfl: 0 ?  nitroGLYCERIN (NITROSTAT) 0.4 MG SL tablet, Place 1 tablet (0.4 mg total) under the  tongue every 5 (five) minutes as needed for chest pain., Disp: 25 tablet, Rfl: prn ?  omeprazole (PRILOSEC) 40 MG capsule, TAKE 1 Capsule  BY MOUTH TWICE DAILY, Disp: 180 capsule, Rfl: 0 ?  Sodium Sulfate-Mag Sulfate-KCl (SUTAB) [phone] MG TABS, Take as directed, Disp: 24 tablet, Rfl: 0 ? ? ? ?Review of Systems ? ?Per HPI unless specifically indicated above ? ?   ?Objective:  ?  ?There were no vitals taken for this visit.  ?Wt Readings from Last 3 Encounters:  ?02/23/21 159 lb 9.6 oz (72.4 kg)  ?11/01/20 165 lb (74.8 kg)  ?10/24/20 167 lb 12.8 oz (76.1 kg)  ?  ?Physical Exam ?Constitutional:   ?   General: She is not in acute distress. ?    Appearance: She is not toxic-appearing.  ?HENT:  ?   Head: Normocephalic and atraumatic.  ?Pulmonary:  ?   Effort: No respiratory distress.  ?   Comments: She is talking in complete sentences without stopping for SOB ?Neurological:  ?   Mental Status: She is oriented to person, place, and time.  ?Psychiatric:     ?   Behavior: Behavior normal.  ? ? ? ? ? ?   ?Assessment & Plan:  ? ?Encounter Diagnoses  ?Name Primary?  ? COPD exacerbation ([place]) Yes  ? Upper respiratory tract infection, unspecified type   ? Tobacco use disorder   ? ? ? ? ?Pt is prescribed prednisone taper.  She is to use nebs as needed.  She is to avoid smoking.  She can continue claritin-d and mucinex.  Rest and fluids are encouraged. ?She has routine follow up on April 17.  She is to contact office sooner for worsening or go to ER if needed ? ? ? ?

## 2021-05-01 ENCOUNTER — Telehealth: Payer: Self-pay | Admitting: Physician Assistant

## 2021-05-01 NOTE — Telephone Encounter (Signed)
Pt called reporting that her bp had been in the 150s since starting the prednisone and it was making her scared.  She was told that she can take 1 1/2 tablets of her lisinopril until she completes her prednisone.  She will continue to monitor the bp and resume taking '20mg'$  of the lisinopril at completion on her prednisone taper.  She will contact office for any further issues.  Pt is in agreement with plan ?

## 2021-05-08 ENCOUNTER — Ambulatory Visit: Payer: Self-pay | Admitting: Physician Assistant

## 2021-05-17 ENCOUNTER — Ambulatory Visit: Payer: Self-pay | Admitting: Physician Assistant

## 2021-05-17 ENCOUNTER — Encounter: Payer: Self-pay | Admitting: Physician Assistant

## 2021-05-17 ENCOUNTER — Other Ambulatory Visit (HOSPITAL_COMMUNITY)
Admission: RE | Admit: 2021-05-17 | Discharge: 2021-05-17 | Disposition: A | Discharge status: Home / Self Care | Payer: Self-pay | Source: Ambulatory Visit | Attending: Physician Assistant | Admitting: Physician Assistant

## 2021-05-17 ENCOUNTER — Ambulatory Visit (HOSPITAL_COMMUNITY)
Admission: RE | Admit: 2021-05-17 | Discharge: 2021-05-17 | Disposition: A | Discharge status: Home / Self Care | Payer: Self-pay | Source: Ambulatory Visit | Attending: Physician Assistant | Admitting: Physician Assistant

## 2021-05-17 VITALS — BP 120/78 | HR 78 | Temp 97.3°F | Wt 160.0 lb

## 2021-05-17 DIAGNOSIS — I10 Essential (primary) hypertension: Secondary | ICD-10-CM

## 2021-05-17 DIAGNOSIS — J449 Chronic obstructive pulmonary disease, unspecified: Secondary | ICD-10-CM

## 2021-05-17 DIAGNOSIS — R079 Chest pain, unspecified: Secondary | ICD-10-CM

## 2021-05-17 DIAGNOSIS — E785 Hyperlipidemia, unspecified: Secondary | ICD-10-CM

## 2021-05-17 LAB — COMPREHENSIVE METABOLIC PANEL
ALT: 15 U/L (ref 0–44)
AST: 14 U/L — ABNORMAL LOW (ref 15–41)
Albumin: 4 g/dL (ref 3.5–5.0)
Alkaline Phosphatase: 72 U/L (ref 38–126)
Anion gap: 7 (ref 5–15)
BUN: 25 mg/dL — ABNORMAL HIGH (ref 6–20)
CO2: 24 mmol/L (ref 22–32)
Calcium: 9 mg/dL (ref 8.9–10.3)
Chloride: 108 mmol/L (ref 98–111)
Creatinine, Ser: 0.78 mg/dL (ref 0.44–1.00)
GFR, Estimated: >60 mL/min (ref >60)
Glucose, Bld: 115 mg/dL — ABNORMAL HIGH (ref 70–99)
Potassium: 4 mmol/L (ref 3.5–5.1)
Sodium: 139 mmol/L (ref 135–145)
Total Bilirubin: 0.5 mg/dL (ref 0.3–1.2)
Total Protein: 7 g/dL (ref 6.5–8.1)

## 2021-05-17 LAB — LIPID PANEL
Cholesterol: 163 mg/dL (ref 0–200)
HDL: 71 mg/dL (ref >40)
LDL Cholesterol: 70 mg/dL (ref 0–99)
Total CHOL/HDL Ratio: 2.3 RATIO
Triglycerides: 112 mg/dL (ref <150)
VLDL: 22 mg/dL (ref 0–40)

## 2021-05-17 MED ORDER — AZITHROMYCIN 250 MG PO TABS: ORAL_TABLET | ORAL | 0 refills | Status: AC

## 2021-05-17 NOTE — Progress Notes (Signed)
? ?BP 120/78   Pulse 78   Temp (!) 97.3 ?F (36.3 ?C)   Wt 160 lb (72.6 kg)   BMI 32.32 kg/m?   ? ?Subjective:  ? ? Patient ID: Pamela Livingston, female    DOB: 1965/08/27, 56 y.o.   MRN: [id number] ? ?HPI: ?Pamela Livingston is a 56 y.o. female presenting on 05/17/2021 for COPD, Hypertension, and Hyperlipidemia ? ? ?HPI ? ?Chief Complaint  ?Patient presents with  ? COPD  ? Hypertension  ? Hyperlipidemia  ? ? ? ? ?She is still having a lot of mucus from when she was sick (seen 04/27/21) and has to sleep sitting up.  Today is her last day of her prednisone.  She has been taking 1 1/2 tab of her lisinopril while on the prednisone due to it made her bp go up.    She can monitor her bp at home.   ? ? ?She is having CP and tightness "behind her boob".   She says that started after her BP went up when she got sick last.   ? ? ?She is still smoking some- says about 1 every 3 days or so.   ? ?She says her stress levels have improved.  ? ? ?Relevant past medical, surgical, family and social history reviewed and updated as indicated. Interim medical history since our last visit reviewed. ?Allergies and medications reviewed and updated. ? ? ?Current Outpatient Medications:  ?  acetaminophen (TYLENOL) 500 MG tablet, Take 1,500 mg by mouth every 6 (six) hours as needed for moderate pain., Disp: , Rfl:  ?  albuterol (PROVENTIL) (2.5 MG/3ML) 0.083% nebulizer solution, INHALE 1 VIAL VIA NEBULIZER EVERY 6 HOURS AS NEEDED FOR WHEEZING OR SHORTNESS OF BREATH, Disp: 225 mL, Rfl: 0 ?  albuterol (VENTOLIN HFA) 108 (90 Base) MCG/ACT inhaler, INHALE 2 PUFFS BY MOUTH EVERY 6 HOURS AS NEEDED FOR COUGHING, WHEEZING, OR SHORTNESS OF BREATH, Disp: 20.1 g, Rfl: 0 ?  atorvastatin (LIPITOR) 20 MG tablet, TAKE 1 Tablet BY MOUTH ONCE DAILY, Disp: 90 tablet, Rfl: 0 ?  diphenhydrAMINE (BENADRYL) 25 MG tablet, Take 50 mg by mouth at bedtime., Disp: , Rfl:  ?  fluticasone-salmeterol (ADVAIR) 100-50 MCG/ACT AEPB, INHALE 1 PUFF BY MOUTH TWICE DAILY (EVERY 12 HOURS).  RINSE MOUTH AFTER EACH USE. (EVERY MORNING AND EVERY NIGHT AT BEDTIME), Disp: 180 each, Rfl: 0 ?  guaiFENesin (MUCINEX) 600 MG 12 hr tablet, Take by mouth 2 (two) times daily., Disp: , Rfl:  ?  lisinopril (ZESTRIL) 20 MG tablet, TAKE 1 Tablet BY MOUTH ONCE DAILY, Disp: 90 tablet, Rfl: 0 ?  loratadine-pseudoephedrine (CLARITIN-D 24-HOUR) 10-240 MG 24 hr tablet, Take 1 tablet by mouth in the morning. , Disp: , Rfl:  ?  meloxicam (MOBIC) 7.5 MG tablet, Take 1 tablet (7.5 mg total) by mouth daily., Disp: 30 tablet, Rfl: 5 ?  montelukast (SINGULAIR) 10 MG tablet, TAKE 1 Tablet BY MOUTH ONCE DAILY AS NEEDED, Disp: 90 tablet, Rfl: 0 ?  nitroGLYCERIN (NITROSTAT) 0.4 MG SL tablet, Place 1 tablet (0.4 mg total) under the tongue every 5 (five) minutes as needed for chest pain., Disp: 25 tablet, Rfl: prn ?  omeprazole (PRILOSEC) 40 MG capsule, TAKE 1 Capsule  BY MOUTH TWICE DAILY, Disp: 180 capsule, Rfl: 0 ?  predniSONE (STERAPRED UNI-PAK 21 TAB) 10 MG (21) TBPK tablet, .pred, Disp: 1 each, Rfl: 0 ?  cyclobenzaprine (FLEXERIL) 10 MG tablet, Take 1 tablet (10 mg total) by mouth 2 (two) times daily as  needed for muscle spasms. (Patient not taking: Reported on 05/17/2021), Disp: 20 tablet, Rfl: 0 ?  gabapentin (NEURONTIN) 100 MG capsule, Take 1 capsule (100 mg total) by mouth 2 (two) times daily as needed. (Patient not taking: Reported on 05/17/2021), Disp: 60 capsule, Rfl: 1 ?  HYDROcodone-acetaminophen (NORCO/VICODIN) 5-325 MG tablet, Take 1 tablet by mouth every 6 (six) hours as needed. (Patient not taking: Reported on 05/17/2021), Disp: 12 tablet, Rfl: 0 ?  Sodium Sulfate-Mag Sulfate-KCl (SUTAB) [id number] MG TABS, Take as directed (Patient not taking: Reported on 05/17/2021), Disp: 24 tablet, Rfl: 0 ? ? ? ?Review of Systems ? ?Per HPI unless specifically indicated above ? ?   ?Objective:  ?  ?BP 120/78   Pulse 78   Temp (!) 97.3 ?F (36.3 ?C)   Wt 160 lb (72.6 kg)   BMI 32.32 kg/m?   ?Wt Readings from Last 3 Encounters:   ?05/17/21 160 lb (72.6 kg)  ?02/23/21 159 lb 9.6 oz (72.4 kg)  ?11/01/20 165 lb (74.8 kg)  ?  ?Physical Exam ?Vitals reviewed.  ?Constitutional:   ?   General: She is not in acute distress. ?   Appearance: She is well-developed. She is not toxic-appearing.  ?HENT:  ?   Head: Normocephalic and atraumatic.  ?Cardiovascular:  ?   Rate and Rhythm: Normal rate and regular rhythm.  ?Pulmonary:  ?   Effort: Pulmonary effort is normal. No respiratory distress.  ?   Breath sounds: Wheezing present.  ?   Comments: RLL exp wheezes ?Abdominal:  ?   General: Bowel sounds are normal.  ?   Palpations: Abdomen is soft. There is no mass.  ?   Tenderness: There is no abdominal tenderness.  ?Musculoskeletal:  ?   Cervical back: Neck supple.  ?   Right lower leg: No edema.  ?   Left lower leg: No edema.  ?Lymphadenopathy:  ?   Cervical: No cervical adenopathy.  ?Skin: ?   General: Skin is warm and dry.  ?Neurological:  ?   Mental Status: She is alert and oriented to person, place, and time.  ?Psychiatric:     ?   Behavior: Behavior normal.  ? ? ?EKG- NSR.  No changes compared with EKG done 07/22/20 ? ? ?   ?Assessment & Plan:  ? ?Encounter Diagnoses  ?Name Primary?  ? Chronic obstructive pulmonary disease, unspecified COPD type ([place]) Yes  ? Hyperlipidemia, unspecified hyperlipidemia type   ? Essential hypertension   ? Chest pain, unspecified type   ? ? ? ?-pt to get CXR when leaves office today.  She will be called with results ?-pt is given zpack ?-reviewed labs done this morning ?-no changes to current medications ?-pt reminded to Update enrollment with [facility] ?-pt is given [place]/application for [employer] financial assistance ?-pt to follow up 3 months.  She is to contact office sooner prn ? ? ?

## 2021-05-25 ENCOUNTER — Telehealth: Payer: Self-pay | Admitting: Physician Assistant

## 2021-05-25 NOTE — Telephone Encounter (Signed)
Pt was called and reminded to update enrollment.  She says she has all paperwork and plans to submit it tomorrow morning ?

## 2021-06-13 ENCOUNTER — Telehealth: Payer: Self-pay | Admitting: Physician Assistant

## 2021-06-13 NOTE — Telephone Encounter (Signed)
Pt called reporting bilateral leg pain, rated 10/10, with R>L.  She says that yesterday she was cleaning her bathroom and was unable to get up off the floor for some time.  She reports no change in swelling.  She has been seen for LE pain in the past and was seen by rheumatology, vascular surgery and orthopedics.  Pt is encouraged to get evaluation in ER (where she can get xrays, [place], etc as deemed necessary).  She says she will go today.  Pt is reminded to update enrollment with [facility].

## 2021-06-15 ENCOUNTER — Emergency Department (HOSPITAL_COMMUNITY): Payer: Self-pay

## 2021-06-15 ENCOUNTER — Encounter (HOSPITAL_COMMUNITY): Payer: Self-pay | Admitting: Emergency Medicine

## 2021-06-15 ENCOUNTER — Emergency Department (HOSPITAL_COMMUNITY)
Admission: EM | Admit: 2021-06-15 | Discharge: 2021-06-15 | Disposition: A | Discharge status: Home / Self Care | Payer: Self-pay | Attending: Emergency Medicine | Admitting: Emergency Medicine

## 2021-06-15 DIAGNOSIS — M545 Low back pain, unspecified: Secondary | ICD-10-CM | POA: Insufficient documentation

## 2021-06-15 DIAGNOSIS — R531 Weakness: Secondary | ICD-10-CM | POA: Insufficient documentation

## 2021-06-15 LAB — BASIC METABOLIC PANEL
Anion gap: 6 (ref 5–15)
BUN: 20 mg/dL (ref 6–20)
CO2: 25 mmol/L (ref 22–32)
Calcium: 9.4 mg/dL (ref 8.9–10.3)
Chloride: 106 mmol/L (ref 98–111)
Creatinine, Ser: 0.87 mg/dL (ref 0.44–1.00)
GFR, Estimated: >60 mL/min (ref >60)
Glucose, Bld: 94 mg/dL (ref 70–99)
Potassium: 4.8 mmol/L (ref 3.5–5.1)
Sodium: 137 mmol/L (ref 135–145)

## 2021-06-15 LAB — MAGNESIUM: Magnesium: 2.3 mg/dL (ref 1.7–2.4)

## 2021-06-15 MED ORDER — DEXAMETHASONE SODIUM PHOSPHATE 10 MG/ML IJ SOLN
10.0000 mg | Freq: Once | INTRAMUSCULAR | Status: AC
  Administered 2021-06-15: 10 mg via INTRAMUSCULAR
  Filled 2021-06-15: qty 1

## 2021-06-15 MED ORDER — HYDROCODONE-ACETAMINOPHEN 5-325 MG PO TABS: 1.0000 | ORAL_TABLET | Freq: Four times a day (QID) | ORAL | 0 refills | Status: DC | PRN

## 2021-06-15 NOTE — Discharge Instructions (Addendum)
You have a bulging disks and a pinched nerve at L4 in your lumbar spine.  You should follow-up with [facility] neurosurgery.  Please call their office tomorrow to schedule an appointment as soon as possible.  We gave you a shot of muscle steroids today which may help with inflammation and pain in your back.  I also prescribed a short course of Norco, which is a narcotic, for your back pain.  Further pain medication and pain management should be performed as an outpatient by your doctor's office or specialist.  I recommend that you buy and use a walker at home for extra stability.  If you have new and worsening weakness in your legs, including loss of feeling in your right leg, foot drop on the right side (dragging your foot), difficulty controlling your bladder, urinating on yourself, please come back to the ER immediately.  These may be signs of more serious nerve injury developing in your back.

## 2021-06-15 NOTE — ED Triage Notes (Signed)
Pt to the ED with c/o bilateral leg pain that runs into her hips. Pt has a HX of arthritis.

## 2021-06-15 NOTE — ED Provider Notes (Signed)
[facility] EMERGENCY DEPARTMENT Provider Note   CSN: [id number] Arrival date & time: 06/15/21  0715     History  Chief Complaint  Patient presents with   Leg Pain    Pamela Livingston is a 56 y.o. female with hx of left sided sciatica presenting to the ED with right sided pain and leg weakness.  Reports onset about 1 week ago, gradual, no falls or jerking accident.  Has pain radiating down her right buttock, feels her right leg is "heavy" and dragging, difficulty walking or bearing weight.  Feels right leg is swollen.  Patient reports hx of "blood clots" diagnosed in this hospital but said she left AMA without treatment and cannot recall anything more specific.  I do not see a positive DVT vascular study on record here.  She is not on A/C.  Her PCP wanted her to come here for imaging including DVT rule out.  She does not see a spine surgeon and denies hx of spinal surgery.  She has chronic left sided radiculopathy.    Patient reports she has had "cramping" all over her body, arms and legs.  HPI     Home Medications Prior to Admission medications   Medication Sig Start Date End Date Taking? Authorizing Provider  HYDROcodone-acetaminophen (NORCO/VICODIN) 5-325 MG tablet Take 1 tablet by mouth every 6 (six) hours as needed for up to 15 doses. 06/15/21  Yes Ej Pinson, Carola Rhine, MD  acetaminophen (TYLENOL) 500 MG tablet Take 1,500 mg by mouth every 6 (six) hours as needed for moderate pain.    [provider]  albuterol (PROVENTIL) (2.5 MG/3ML) 0.083% nebulizer solution INHALE 1 VIAL VIA NEBULIZER EVERY 6 HOURS AS NEEDED FOR WHEEZING OR SHORTNESS OF BREATH 04/12/21   Soyla Dryer, PA-C  albuterol (VENTOLIN HFA) 108 (90 Base) MCG/ACT inhaler INHALE 2 PUFFS BY MOUTH EVERY 6 HOURS AS NEEDED FOR COUGHING, WHEEZING, OR SHORTNESS OF BREATH 04/12/21   Soyla Dryer, PA-C  atorvastatin (LIPITOR) 20 MG tablet TAKE 1 Tablet BY MOUTH ONCE DAILY 04/12/21   Soyla Dryer, PA-C   cyclobenzaprine (FLEXERIL) 10 MG tablet Take 1 tablet (10 mg total) by mouth 2 (two) times daily as needed for muscle spasms. Patient not taking: Reported on 05/17/2021 10/24/20   Dorie Rank, MD  diphenhydrAMINE (BENADRYL) 25 MG tablet Take 50 mg by mouth at bedtime.    [provider]  fluticasone-salmeterol (ADVAIR) 100-50 MCG/ACT AEPB INHALE 1 PUFF BY MOUTH TWICE DAILY (EVERY 12 HOURS). RINSE MOUTH AFTER EACH USE. (EVERY MORNING AND EVERY NIGHT AT BEDTIME) 04/12/21   Soyla Dryer, PA-C  gabapentin (NEURONTIN) 100 MG capsule Take 1 capsule (100 mg total) by mouth 2 (two) times daily as needed. Patient not taking: Reported on 05/17/2021 08/15/20   Soyla Dryer, PA-C  guaiFENesin (MUCINEX) 600 MG 12 hr tablet Take by mouth 2 (two) times daily.    [provider]  HYDROcodone-acetaminophen (NORCO/VICODIN) 5-325 MG tablet Take 1 tablet by mouth every 6 (six) hours as needed. Patient not taking: Reported on 05/17/2021 10/24/20   Dorie Rank, MD  lisinopril (ZESTRIL) 20 MG tablet TAKE 1 Tablet BY MOUTH ONCE DAILY 04/12/21   Soyla Dryer, PA-C  loratadine-pseudoephedrine (CLARITIN-D 24-HOUR) 10-240 MG 24 hr tablet Take 1 tablet by mouth in the morning.     [provider]  meloxicam (MOBIC) 7.5 MG tablet Take 1 tablet (7.5 mg total) by mouth daily. 02/23/21   Carole Civil, MD  montelukast (SINGULAIR) 10 MG tablet TAKE 1 Tablet BY  MOUTH ONCE DAILY AS NEEDED 04/12/21   Soyla Dryer, PA-C  nitroGLYCERIN (NITROSTAT) 0.4 MG SL tablet Place 1 tablet (0.4 mg total) under the tongue every 5 (five) minutes as needed for chest pain. 10/02/18   Soyla Dryer, PA-C  omeprazole (PRILOSEC) 40 MG capsule TAKE 1 Capsule  BY MOUTH TWICE DAILY 04/12/21   Soyla Dryer, PA-C  predniSONE (STERAPRED UNI-PAK 21 TAB) 10 MG (21) TBPK tablet .pred 04/27/21   Soyla Dryer, PA-C  Sodium Sulfate-Mag Sulfate-KCl (SUTAB) [phone] MG TABS Take as directed Patient not taking: Reported  on 05/17/2021 07/06/20   Eloise Harman, DO      Allergies    Asa [aspirin] and Codeine    Review of Systems   Review of Systems  Physical Exam Updated Vital Signs BP 114/84   Pulse 89   Temp 97.7 F (36.5 C) (Oral)   Resp 16   Ht '4\' 11"'$  (1.499 m)   Wt 72.6 kg   SpO2 98%   BMI 32.33 kg/m  Physical Exam Constitutional:      General: She is not in acute distress. HENT:     Head: Normocephalic and atraumatic.  Eyes:     Conjunctiva/sclera: Conjunctivae normal.     Pupils: Pupils are equal, round, and reactive to light.  Cardiovascular:     Rate and Rhythm: Normal rate and regular rhythm.  Pulmonary:     Effort: Pulmonary effort is normal. No respiratory distress.  Skin:    General: Skin is warm and dry.  Neurological:     General: No focal deficit present.     Mental Status: She is alert. Mental status is at baseline.     Comments: 3/5 strength in right hip flexion, ankle flexion Gross sensation intact, fine sensation diminished  Psychiatric:        Mood and Affect: Mood normal.        Behavior: Behavior normal.    ED Results / Procedures / Treatments   Labs (all labs ordered are listed, but only abnormal results are displayed) Labs Reviewed  BASIC METABOLIC PANEL  MAGNESIUM    EKG None  Radiology MR LUMBAR SPINE WO CONTRAST  Result Date: 06/15/2021 CLINICAL DATA:  Low back pain. Cauda equina syndrome suspected. Bilateral leg pain extending to the hips. EXAM: MRI LUMBAR SPINE WITHOUT CONTRAST TECHNIQUE: Multiplanar, multisequence MR imaging of the lumbar spine was performed. No intravenous contrast was administered. COMPARISON:  Radiography 07/31/2016 FINDINGS: Segmentation:  5 lumbar type vertebral bodies. Alignment: 3 mm degenerative anterolisthesis L4-5 that could worsen with standing or flexion. Vertebrae:  No fracture or focal bone lesion. Conus medullaris and cauda equina: Conus extends to the T12-L1 level. Conus and cauda equina appear normal.  Paraspinal and other soft tissues: Negative Disc levels: No disc abnormality from T11-12 through L2-3. mild facet degeneration and hypertrophy at L2-3. No stenosis. L3-4: Minimal disc bulge. Mild facet degeneration and hypertrophy. No stenosis. L4-5: Advanced bilateral facet arthropathy with gaping fluid-filled joints. 3 mm of anterolisthesis that would likely worsen with standing or flexion. Mild stenosis of the lateral recesses and foramina but without definite neural compression. Some potential the right L4 nerve could be affected in the foramen. The facet arthritis could be a cause of back pain or referred facet syndrome pain. L5-S1: Normal appearance of the disc. Bilateral facet osteoarthritis without slippage. 3 mm synovial cyst projecting inward from the facet on the right without compression of the neural structures. No stenosis at this level. Findings could relate to back  pain or referred facet syndrome pain. IMPRESSION: L4-5: Advanced bilateral facet arthropathy with gaping fluid-filled joints. 3 mm of anterolisthesis that could worsen with standing or flexion. Mild bulging of the disc. Mild narrowing of the lateral recesses and foramina but no definite neural compression. Encroachment upon the foramen on the right is more pronounced on the left and it is possible the right L4 nerve could be affected. The facet arthropathy could relate to back pain or referred facet syndrome pain. L5-S1: Bilateral facet osteoarthritis. No stenosis. Findings could relate to back pain or referred facet syndrome pain. Lesser facet osteoarthritis at L2-3 and L3-4. Electronically Signed   By: Nelson Chimes M.D.   On: 06/15/2021 10:14   US Venous Img Lower Right (DVT Study)  Result Date: 06/15/2021 CLINICAL DATA:  right posterior upper leg pain, swelling EXAM: RIGHT LOWER EXTREMITY VENOUS DOPPLER ULTRASOUND TECHNIQUE: Gray-scale sonography with compression, as well as color and duplex ultrasound, were performed to evaluate  the deep venous system(s) from the level of the common femoral vein through the popliteal and proximal calf veins. COMPARISON:  None Available. FINDINGS: VENOUS Normal compressibility of the common femoral, superficial femoral, and popliteal veins, as well as the visualized calf veins. Visualized portions of profunda femoral vein and great saphenous vein unremarkable. No filling defects to suggest DVT on grayscale or color Doppler imaging. Doppler waveforms show normal direction of venous flow, normal respiratory plasticity and response to augmentation. Limited views of the contralateral common femoral vein are unremarkable. IMPRESSION: No evidence of DVT in the right lower extremity. Electronically Signed   By: Margaretha Sheffield M.D.   On: 06/15/2021 09:26    Procedures Procedures    Medications Ordered in ED Medications  dexamethasone (DECADRON) injection 10 mg (10 mg Intramuscular Given 06/15/21 1134)    ED Course/ Medical Decision Making/ A&P Clinical Course as of 06/15/21 1631  Thu Jun 15, 2021  1122 I reviewed the patient's work-up with her, no acute clot noted on DVT.  MRI does show L4-L5 disc herniation, but this does not correlate with generalized weakness on exam.  She has no foot drop, no paresthesias or loss of sensation in the L4 distribution.  She does seem to have weakness with hip flexion, but also appears limited by pain in her back.  She is able to ambulate at home.  She does have a walker that she can use for additional assistance.  I will give her a shot of intramuscular Decadron, can prescribe a short course of Norco which is worked her pain in the past, she is already taking 800 mg gabapentin 3 times a day.  She will need to follow-up [facility] and will call them today or tomorrow to schedule an appointment [MT]    Clinical Course User Index [MT] Heyli Min, Carola Rhine, MD                           Medical Decision Making Amount and/or Complexity of Data  Reviewed Labs: ordered. Radiology: ordered.  Risk Prescription drug management.   This patient presents to the ED with concern for lower back pain, arm cramping. This involves an extensive number of treatment options, and is a complaint that carries with it a high risk of complications and morbidity.  The differential diagnosis includes sciatica versus cauda equina versus DVT versus other  Labs ordered and reviewed, specifically electrolyte levels due to the patient reporting cramping all over her body.  Potassium,  calcium, sodium levels all within normal limits.  No acute AKI  I ordered imaging studies including DVT study, MRI of the lumbar spine I independently visualized and interpreted imaging which showed no acute DVT, disc herniation and some nerve root impingement at L4-L5 I agree with the radiologist interpretation  The patient was maintained on a cardiac monitor.  I personally viewed and interpreted the cardiac monitored which showed an underlying rhythm of: Normal sinus rhythm  I ordered medication including Decadron for suspected nerve pain  After the interventions noted above, I reevaluated the patient and found that they have: improved  Dispostion:  After consideration of the diagnostic results and the patients response to treatment, I feel that the patent would benefit from outpatient specialist follow-up.         Final Clinical Impression(s) / ED Diagnoses Final diagnoses:  Lumbar pain    Rx / DC Orders ED Discharge Orders          Ordered    HYDROcodone-acetaminophen (NORCO/VICODIN) 5-325 MG tablet  Every 6 hours PRN        06/15/21 1125              Wyvonnia Dusky, MD 06/15/21 1631

## 2021-06-15 NOTE — ED Notes (Addendum)
ED Provider at bedside. Pt given water and sprite.

## 2021-07-12 ENCOUNTER — Other Ambulatory Visit: Payer: Self-pay | Admitting: Physician Assistant

## 2021-07-18 ENCOUNTER — Ambulatory Visit: Payer: Self-pay | Admitting: Physician Assistant

## 2021-07-18 ENCOUNTER — Encounter: Payer: Self-pay | Admitting: Physician Assistant

## 2021-07-18 VITALS — BP 106/78 | HR 70 | Temp 97.7°F

## 2021-07-18 DIAGNOSIS — R231 Pallor: Secondary | ICD-10-CM

## 2021-07-18 DIAGNOSIS — R262 Difficulty in walking, not elsewhere classified: Secondary | ICD-10-CM

## 2021-07-18 DIAGNOSIS — F17200 Nicotine dependence, unspecified, uncomplicated: Secondary | ICD-10-CM

## 2021-07-18 DIAGNOSIS — J449 Chronic obstructive pulmonary disease, unspecified: Secondary | ICD-10-CM

## 2021-07-18 DIAGNOSIS — M79605 Pain in left leg: Secondary | ICD-10-CM

## 2021-07-18 DIAGNOSIS — G8929 Other chronic pain: Secondary | ICD-10-CM

## 2021-07-18 MED ORDER — CYCLOBENZAPRINE HCL 10 MG PO TABS: 10.0000 mg | ORAL_TABLET | Freq: Three times a day (TID) | ORAL | 0 refills | Status: DC | PRN

## 2021-08-11 ENCOUNTER — Other Ambulatory Visit: Payer: Self-pay | Admitting: Orthopedic Surgery

## 2021-08-11 DIAGNOSIS — M222X1 Patellofemoral disorders, right knee: Secondary | ICD-10-CM

## 2021-08-11 DIAGNOSIS — M25562 Pain in left knee: Secondary | ICD-10-CM

## 2021-08-16 ENCOUNTER — Ambulatory Visit: Payer: Self-pay | Admitting: Physician Assistant

## 2021-09-04 ENCOUNTER — Other Ambulatory Visit: Payer: Self-pay | Admitting: Orthopedic Surgery

## 2021-09-04 DIAGNOSIS — M25562 Pain in left knee: Secondary | ICD-10-CM

## 2021-09-04 DIAGNOSIS — M222X1 Patellofemoral disorders, right knee: Secondary | ICD-10-CM

## 2021-09-11 ENCOUNTER — Other Ambulatory Visit: Payer: Self-pay | Admitting: Radiology

## 2021-09-11 DIAGNOSIS — M222X1 Patellofemoral disorders, right knee: Secondary | ICD-10-CM

## 2021-09-11 DIAGNOSIS — M25562 Pain in left knee: Secondary | ICD-10-CM

## 2021-09-11 NOTE — Telephone Encounter (Signed)
Refill request received via fax for Meloxicam

## 2021-09-12 MED ORDER — MELOXICAM 7.5 MG PO TABS: 7.5000 mg | ORAL_TABLET | Freq: Every day | ORAL | 2 refills | Status: DC

## 2021-09-13 ENCOUNTER — Ambulatory Visit: Payer: Self-pay | Admitting: Physician Assistant

## 2021-09-13 ENCOUNTER — Encounter: Payer: Self-pay | Admitting: Physician Assistant

## 2021-09-13 VITALS — BP 110/76 | HR 94 | Temp 96.1°F | Wt 168.0 lb

## 2021-09-13 DIAGNOSIS — Z1239 Encounter for other screening for malignant neoplasm of breast: Secondary | ICD-10-CM

## 2021-09-13 DIAGNOSIS — E785 Hyperlipidemia, unspecified: Secondary | ICD-10-CM

## 2021-09-13 DIAGNOSIS — I10 Essential (primary) hypertension: Secondary | ICD-10-CM

## 2021-09-13 DIAGNOSIS — J449 Chronic obstructive pulmonary disease, unspecified: Secondary | ICD-10-CM

## 2021-09-13 DIAGNOSIS — F17200 Nicotine dependence, unspecified, uncomplicated: Secondary | ICD-10-CM

## 2021-09-13 NOTE — Patient Instructions (Signed)
[place] [facility]. [address] [place],  [place]  [id number] [phone]

## 2021-09-13 NOTE — Progress Notes (Unsigned)
   BP 110/76   Pulse 94   Temp (!) 96.1 F (35.6 C)   Wt 168 lb (76.2 kg)   SpO2 96%   BMI 33.93 kg/m    Subjective:    Patient ID: Pamela Livingston, female    DOB: [birth year], 56 y.o.   MRN: [id number]  HPI: Pamela Livingston is a 56 y.o. female presenting on 09/13/2021 for Hypertension, Hyperlipidemia, and COPD   HPI  Relevant past medical, surgical, family and social history reviewed and updated as indicated. Interim medical history since our last visit reviewed. Allergies and medications reviewed and updated.   Current Outpatient Medications:    acetaminophen (TYLENOL) 500 MG tablet, Take 1,500 mg by mouth every 6 (six) hours as needed for moderate pain., Disp: , Rfl:    albuterol (PROVENTIL) (2.5 MG/3ML) 0.083% nebulizer solution, INHALE 1 VIAL VIA NEBULIZER EVERY 6 HOURS AS NEEDED FOR WHEEZING OR SHORTNESS OF BREATH, Disp: 225 mL, Rfl: 0   albuterol (VENTOLIN HFA) 108 (90 Base) MCG/ACT inhaler, INHALE 2 PUFFS BY MOUTH EVERY 6 HOURS AS NEEDED FOR COUGHING, WHEEZING, OR SHORTNESS OF BREATH, Disp: 20.1 g, Rfl: 0   atorvastatin (LIPITOR) 20 MG tablet, TAKE 1 Tablet BY MOUTH ONCE DAILY, Disp: 90 tablet, Rfl: 0   diphenhydrAMINE (BENADRYL) 25 MG tablet, Take 50 mg by mouth at bedtime., Disp: , Rfl:    guaiFENesin (MUCINEX) 600 MG 12 hr tablet, Take by mouth 2 (two) times daily., Disp: , Rfl:    lisinopril (ZESTRIL) 20 MG tablet, TAKE 1 Tablet BY MOUTH ONCE DAILY, Disp: 90 tablet, Rfl: 0   loratadine-pseudoephedrine (CLARITIN-D 24-HOUR) 10-240 MG 24 hr tablet, Take 1 tablet by mouth in the morning. , Disp: , Rfl:    meloxicam (MOBIC) 7.5 MG tablet, Take 1 tablet by mouth once daily, Disp: 30 tablet, Rfl: 0   montelukast (SINGULAIR) 10 MG tablet, TAKE 1 Tablet BY MOUTH ONCE DAILY AS NEEDED, Disp: 90 tablet, Rfl: 0   nitroGLYCERIN (NITROSTAT) 0.4 MG SL tablet, Place 1 tablet (0.4 mg total) under the tongue every 5 (five) minutes as needed for chest pain., Disp: 25 tablet, Rfl: prn   omeprazole  (PRILOSEC) 40 MG capsule, TAKE 1 Capsule  BY MOUTH TWICE DAILY, Disp: 180 capsule, Rfl: 0   WIXELA INHUB 100-50 MCG/ACT AEPB, INHALE 1 PUFF BY MOUTH TWICE DAILY (EVERY 12 HOURS). RINSE MOUTH AFTER EACH USE. (EVERY MORNING AND EVERY NIGHT AT BEDTIME), Disp: 180 each, Rfl: 0   cyclobenzaprine (FLEXERIL) 10 MG tablet, Take 1 tablet (10 mg total) by mouth 3 (three) times daily as needed for muscle spasms. (Patient not taking: Reported on 09/13/2021), Disp: 30 tablet, Rfl: 0   meloxicam (MOBIC) 7.5 MG tablet, Take 1 tablet (7.5 mg total) by mouth daily. (Patient not taking: Reported on 09/13/2021), Disp: 30 tablet, Rfl: 2    Review of Systems  Per HPI unless specifically indicated above     Objective:    BP 110/76   Pulse 94   Temp (!) 96.1 F (35.6 C)   Wt 168 lb (76.2 kg)   SpO2 96%   BMI 33.93 kg/m   Wt Readings from Last 3 Encounters:  09/13/21 168 lb (76.2 kg)  06/15/21 160 lb 0.9 oz (72.6 kg)  05/17/21 160 lb (72.6 kg)    Physical Exam       Assessment & Plan:     Update labs Mammogram F/u 3 mo

## 2021-10-11 ENCOUNTER — Ambulatory Visit (HOSPITAL_COMMUNITY): Payer: Medicaid Other | Attending: Orthopedic Surgery

## 2021-10-11 DIAGNOSIS — M79604 Pain in right leg: Secondary | ICD-10-CM | POA: Insufficient documentation

## 2021-10-11 DIAGNOSIS — G8929 Other chronic pain: Secondary | ICD-10-CM | POA: Insufficient documentation

## 2021-10-11 DIAGNOSIS — R262 Difficulty in walking, not elsewhere classified: Secondary | ICD-10-CM | POA: Insufficient documentation

## 2021-10-11 DIAGNOSIS — M79605 Pain in left leg: Secondary | ICD-10-CM | POA: Insufficient documentation

## 2021-10-11 DIAGNOSIS — M545 Low back pain, unspecified: Secondary | ICD-10-CM | POA: Insufficient documentation

## 2021-10-11 DIAGNOSIS — M5442 Lumbago with sciatica, left side: Secondary | ICD-10-CM | POA: Insufficient documentation

## 2021-10-11 DIAGNOSIS — R2689 Other abnormalities of gait and mobility: Secondary | ICD-10-CM | POA: Insufficient documentation

## 2021-10-11 NOTE — Therapy (Signed)
OUTPATIENT PHYSICAL THERAPY THORACOLUMBAR EVALUATION   Patient Name: Pamela Livingston MRN: [id number] DOB:1965/04/17, 56 y.o., female Today's Date: 10/11/2021   PT End of Session - 10/11/21 1544     Visit Number 1    Number of Visits 8    Date for PT Re-Evaluation 11/08/21    Authorization Type [facility] Financial Assistance letter for coverage    PT Start Time 1545    PT Stop Time 1630    PT Time Calculation (min) 45 min             Past Medical History:  Diagnosis Date   Asthma    Chronic pelvic pain in female    COPD (chronic obstructive pulmonary disease) ([place])    Endometriosis    Polysubstance abuse ([place])    marijuana, cocaine, benzos   Stomach ulcer    Suicide attempt ([place]) 2008   by phenergan overdose   Syncope    Past Surgical History:  Procedure Laterality Date   ABDOMINAL HYSTERECTOMY     BALLOON DILATION N/A 07/26/2020   Procedure: BALLOON DILATION;  Surgeon: Pamela Harman, DO;  [facility];  Service: Endoscopy;  Laterality: N/A;   BIOPSY  07/26/2020   Procedure: BIOPSY;  Surgeon: Pamela Harman, DO;  [facility];  Service: Endoscopy;;   CERVICAL CONE BIOPSY     cervical cancer   CESAREAN SECTION  1993   CHOLECYSTECTOMY     COLONOSCOPY WITH PROPOFOL N/A 07/26/2020   Procedure: COLONOSCOPY WITH PROPOFOL;  Surgeon: Pamela Harman, DO;  [facility];  Service: Endoscopy;  Laterality: N/A;  12:00pm   ESOPHAGOGASTRODUODENOSCOPY (EGD) WITH PROPOFOL N/A 07/26/2020   Procedure: ESOPHAGOGASTRODUODENOSCOPY (EGD) WITH PROPOFOL;  Surgeon: Pamela Harman, DO;  [facility];  Service: Endoscopy;  Laterality: N/A;   RADICAL HYSTERECTOMY WITH TRANSPOSITION OF OVARIES     TOOTH EXTRACTION     TUBAL LIGATION  1993   Patient Active Problem List   Diagnosis Date Noted   Bilateral hand pain 09/20/2020   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   Pain in left leg 09/20/2020   Esophageal dysphagia 09/30/2019   RUQ pain 05/27/2019   GERD  (gastroesophageal reflux disease) 05/27/2019   Constipation 05/27/2019   Rectal bleeding 05/27/2019   Cigarette nicotine dependence with nicotine-induced disorder 04/15/2015   Chronic obstructive pulmonary disease ([place]) 04/15/2015   Hyperlipidemia 04/15/2015   Abnormal mammogram 04/15/2015   Hematuria 03/07/2015   Abdominal pain, epigastric 03/07/2015   Cigarette nicotine dependence, uncomplicated 84/13/2440   History of substance abuse ([place]) 03/07/2015   Esophageal reflux 03/07/2015    PCP:   Pamela Dryer, PA-C    REFERRING PROVIDER: Carole Civil, MD [facility]   REFERRING DIAG: [phone] (ICD-10-CM) - Chronic left-sided low back pain with left-sided sciatica   Rationale for Evaluation and Treatment Rehabilitation  THERAPY DIAG:  Difficulty in walking, not elsewhere classified  Chronic left-sided low back pain with left-sided sciatica  Low back pain, unspecified back pain laterality, unspecified chronicity, unspecified whether sciatica present  Other abnormalities of gait and mobility  Pain in right leg  Pain in left leg  ONSET DATE: 3-4 years  SUBJECTIVE:  SUBJECTIVE STATEMENT: Back and bilateral leg pain for the past 3-4 years and has worsened over time; also having pain neck and left shoulder pain. " I haven't slept in 2 days" sleeps sitting up. Unable to lie in bed or get up out of bed; Unable to pick up my 54 yo grandson.  PERTINENT HISTORY:  No previous surgery or injections  PAIN:  Are you having pain? Yes: NPRS scale: 8/10 Pain location: low back and legs Pain description: hurts all the time; burning aching Aggravating factors: everything; prolonged positioning Relieving factors: unknown; "I don't do medicine"   PRECAUTIONS:  None  WEIGHT BEARING RESTRICTIONS No  FALLS:  Has patient fallen in last 6 months? No  LIVING ENVIRONMENT: Lives with: lives with an adult companion Lives in: Mobile home Stairs: Yes: External: 3 steps; on right going up, on left going up, and can reach both Has following equipment at home: Single point cane, Quad cane small base, and Walker - 4 wheeled  OCCUPATION: not working  PLOF: Independent  PATIENT GOALS walking good, less pain   OBJECTIVE:   DIAGNOSTIC FINDINGS:  CLINICAL DATA:  Low back pain. Cauda equina syndrome suspected. Bilateral leg pain extending to the hips.   EXAM: MRI LUMBAR SPINE WITHOUT CONTRAST   TECHNIQUE: Multiplanar, multisequence MR imaging of the lumbar spine was performed. No intravenous contrast was administered.   COMPARISON:  Radiography 07/31/2016   FINDINGS: Segmentation:  5 lumbar type vertebral bodies.   Alignment: 3 mm degenerative anterolisthesis L4-5 that could worsen with standing or flexion.   Vertebrae:  No fracture or focal bone lesion.   Conus medullaris and cauda equina: Conus extends to the T12-L1 level. Conus and cauda equina appear normal.   Paraspinal and other soft tissues: Negative   Disc levels:   No disc abnormality from T11-12 through L2-3. mild facet degeneration and hypertrophy at L2-3. No stenosis.   L3-4: Minimal disc bulge. Mild facet degeneration and hypertrophy. No stenosis.   L4-5: Advanced bilateral facet arthropathy with gaping fluid-filled joints. 3 mm of anterolisthesis that would likely worsen with standing or flexion. Mild stenosis of the lateral recesses and foramina but without definite neural compression. Some potential the right L4 nerve could be affected in the foramen. The facet arthritis could be a cause of back pain or referred facet syndrome pain.   L5-S1: Normal appearance of the disc. Bilateral facet osteoarthritis without slippage. 3 mm synovial cyst projecting inward from  the facet on the right without compression of the neural structures. No stenosis at this level. Findings could relate to back pain or referred facet syndrome pain.   IMPRESSION: L4-5: Advanced bilateral facet arthropathy with gaping fluid-filled joints. 3 mm of anterolisthesis that could worsen with standing or flexion. Mild bulging of the disc. Mild narrowing of the lateral recesses and foramina but no definite neural compression. Encroachment upon the foramen on the right is more pronounced on the left and it is possible the right L4 nerve could be affected. The facet arthropathy could relate to back pain or referred facet syndrome pain.   L5-S1: Bilateral facet osteoarthritis. No stenosis. Findings could relate to back pain or referred facet syndrome pain.   Lesser facet osteoarthritis at L2-3 and L3-4.     Electronically Signed   By: Nelson Chimes M.D.   On: 06/15/2021 10:14  PATIENT SURVEYS:  FOTO 24 %  COGNITION:  Overall cognitive status: Within functional limits for tasks assessed     SENSATION: Denies numbness  POSTURE: rounded shoulders, forward head, and guarded posturing  PALPATION: Tender lower lumbar spine paraspinals; quadratus bilaterally; bilateral glute and piriformis tenderness  LUMBAR ROM:   Active  AROM  eval  Flexion 15% available **  Extension To neutral ** most painful movement  Right lateral flexion To mid thigh  Left lateral flexion To 3 inches above knee  Right rotation   Left rotation    (Blank rows = not tested)   LOWER EXTREMITY MMT:    MMT Right eval Left eval  Hip flexion 3- 3-  Hip extension    Hip abduction    Hip adduction    Hip internal rotation    Hip external rotation    Knee flexion    Knee extension 4 3+  Ankle dorsiflexion 4 3  Ankle plantarflexion    Ankle inversion    Ankle eversion     (Blank rows = not tested)  FUNCTIONAL TESTS:  5 times sit to stand: 47.44 2 MWT 122 ft  GAIT: Distance walked:  122 ft Assistive device utilized: Single point cane Level of assistance: Modified independence Comments: antalgic gait; slow gait speed    TODAY'S TREATMENT  Physical therapy evaluation and HEP instruction   PATIENT EDUCATION:  Education details: Patient educated on exam findings, POC, scope of PT, HEP. Person educated: Patient Education method: Explanation, Demonstration, and Handouts Education comprehension: verbalized understanding, returned demonstration, verbal cues required, and tactile cues required   HOME EXERCISE PROGRAM: Access Code: [facility] URL: [email or web address] Date: 10/11/2021 Prepared by: [facility] - [facility]  Exercises - Sit to Stand with Counter Support  - 2 x daily - 7 x weekly - 2 sets - 5 reps - Seated Heel Toe Raises  - 2 x daily - 7 x weekly - 1 sets - 10 reps - Seated Long Arc Quad  - 2 x daily - 7 x weekly - 1 sets - 10 reps - Seated March  - 2 x daily - 7 x weekly - 1 sets - 10 reps  ASSESSMENT:  CLINICAL IMPRESSION: Patient is a 56 y.o. female who was seen today for physical therapy evaluation and treatment for M54.42,G89.29 (ICD-10-CM) - Chronic left-sided low back pain with left-sided sciatica . Patient presents on evaluation with significantly limited lumbar mobility; decreased bilateral lower extremity strength; decreased perceived functional mobility per FOTO score and pain that limits tolerance for any activity in the home and community and disturbs her sleep.  Patient will benefit from continued skilled therapy services to address deficits and promote return to optimal function.        OBJECTIVE IMPAIRMENTS Abnormal gait, decreased activity tolerance, decreased endurance, decreased knowledge of condition, decreased mobility, difficulty walking, decreased ROM, decreased strength, hypomobility, increased fascial restrictions, impaired perceived functional ability, increased muscle spasms, impaired flexibility, postural dysfunction, and  pain.   ACTIVITY LIMITATIONS carrying, lifting, bending, sitting, standing, squatting, sleeping, stairs, transfers, bed mobility, bathing, dressing, hygiene/grooming, locomotion level, and caring for others  PARTICIPATION LIMITATIONS: meal prep, cleaning, laundry, driving, shopping, community activity, occupation, and yard work  [place] are also affecting patient's functional outcome.   REHAB POTENTIAL: Good  CLINICAL DECISION MAKING: Stable/uncomplicated  EVALUATION COMPLEXITY: Low   GOALS: Goals reviewed with patient? No  SHORT TERM GOALS: Target date: 10/25/2021   patient will be independent with initial HEP  Baseline: Goal status: INITIAL  2.  Patient will improve 5 x STS score from 47.44 sec to 40 sec to demonstrate improved functional mobility and increased lower extremity strength.  Baseline:  Goal status: INITIAL   LONG TERM GOALS: Target date: 11/08/2021  Patient will report at least 50% improvement in overall symptoms and/or function to demonstrate improved functional mobility  Baseline:  Goal status: INITIAL  2.  Patient will improve FOTO score to predicted value to demonstrate improved functional mobility  Baseline: 24 Goal status: INITIAL  3.  Patient will improve 5 x STS score from 47.44 sec to 30 sec to demonstrate improved functional mobility and increased lower extremity strength.  Baseline:  Goal status: INITIAL  4.  Patient will increase distance on 2MWT to 226 ft to demonstrate improved functional gait and mobility  Baseline:  Goal status: INITIAL  5.   Patient will increase bilateral MMTs to 4/5-5/5 to promote return to ambulation community distances with minimal deviation Baseline: see above Goal status: INITIAL  6.  Patient will be able to tolerate lying supine or sidelying without increased pain to allow patient to be more comfortable trying to sleep in bed. Baseline:  Goal status: INITIAL   PLAN: PT FREQUENCY:  2x/week  PT DURATION: 4 weeks  PLANNED INTERVENTIONS: Therapeutic exercises, Therapeutic activity, Neuromuscular re-education, Balance training, Gait training, Patient/Family education, Joint manipulation, Joint mobilization, Stair training, Orthotic/Fit training, DME instructions, Aquatic Therapy, Dry Needling, Electrical stimulation, Spinal manipulation, Spinal mobilization, Cryotherapy, Moist heat, Compression bandaging, scar mobilization, Splintting, Taping, Traction, Ultrasound, Ionotophoresis '4mg'$ /ml Dexamethasone, and Manual therapy  PLAN FOR NEXT SESSION: Review of HEP and goals; try to get patient on a mat as she is currently unable to lie in bed or get into or out of bed.  Progress lumbar mobility and core and leg strengthening as able.    4:46 PM, 10/11/21 Pamela Livingston MPT  physical therapy [place] [phone]

## 2021-10-19 ENCOUNTER — Encounter (HOSPITAL_COMMUNITY): Payer: Medicaid Other | Admitting: Physical Therapy

## 2021-10-22 DIAGNOSIS — J189 Pneumonia, unspecified organism: Secondary | ICD-10-CM

## 2021-10-22 HISTORY — DX: Pneumonia, unspecified organism: J18.9

## 2021-10-23 ENCOUNTER — Other Ambulatory Visit: Payer: Self-pay | Admitting: Physician Assistant

## 2021-10-23 ENCOUNTER — Ambulatory Visit (HOSPITAL_COMMUNITY): Payer: Self-pay | Attending: Orthopedic Surgery | Admitting: Physical Therapy

## 2021-10-23 DIAGNOSIS — M79604 Pain in right leg: Secondary | ICD-10-CM | POA: Insufficient documentation

## 2021-10-23 DIAGNOSIS — G8929 Other chronic pain: Secondary | ICD-10-CM | POA: Insufficient documentation

## 2021-10-23 DIAGNOSIS — R262 Difficulty in walking, not elsewhere classified: Secondary | ICD-10-CM | POA: Insufficient documentation

## 2021-10-23 DIAGNOSIS — M545 Low back pain, unspecified: Secondary | ICD-10-CM | POA: Insufficient documentation

## 2021-10-23 DIAGNOSIS — M5442 Lumbago with sciatica, left side: Secondary | ICD-10-CM | POA: Insufficient documentation

## 2021-10-23 DIAGNOSIS — R2689 Other abnormalities of gait and mobility: Secondary | ICD-10-CM | POA: Insufficient documentation

## 2021-10-23 DIAGNOSIS — M79605 Pain in left leg: Secondary | ICD-10-CM | POA: Insufficient documentation

## 2021-10-23 NOTE — Therapy (Signed)
OUTPATIENT PHYSICAL THERAPY TREATMENT   Patient Name: MONACA WADAS MRN: [id number] DOB:[birth year], 56 y.o., female Today's Date: 10/23/2021   PT End of Session - 10/23/21 1116     Visit Number 2    Number of Visits 8    Date for PT Re-Evaluation 11/08/21    Authorization Type [facility] Financial Assistance letter for coverage    PT Start Time 1117    PT Stop Time 1158    PT Time Calculation (min) 41 min             Past Medical History:  Diagnosis Date   Asthma    Chronic pelvic pain in female    COPD (chronic obstructive pulmonary disease) ([place])    Endometriosis    Polysubstance abuse ([place])    marijuana, cocaine, benzos   Stomach ulcer    Suicide attempt ([place]) 2008   by phenergan overdose   Syncope    Past Surgical History:  Procedure Laterality Date   ABDOMINAL HYSTERECTOMY     BALLOON DILATION N/A 07/26/2020   Procedure: BALLOON DILATION;  Surgeon: Eloise Harman, DO;  [facility];  Service: Endoscopy;  Laterality: N/A;   BIOPSY  07/26/2020   Procedure: BIOPSY;  Surgeon: Eloise Harman, DO;  [facility];  Service: Endoscopy;;   CERVICAL CONE BIOPSY     cervical cancer   CESAREAN SECTION  1993   CHOLECYSTECTOMY     COLONOSCOPY WITH PROPOFOL N/A 07/26/2020   Procedure: COLONOSCOPY WITH PROPOFOL;  Surgeon: Eloise Harman, DO;  [facility];  Service: Endoscopy;  Laterality: N/A;  12:00pm   ESOPHAGOGASTRODUODENOSCOPY (EGD) WITH PROPOFOL N/A 07/26/2020   Procedure: ESOPHAGOGASTRODUODENOSCOPY (EGD) WITH PROPOFOL;  Surgeon: Eloise Harman, DO;  [facility];  Service: Endoscopy;  Laterality: N/A;   RADICAL HYSTERECTOMY WITH TRANSPOSITION OF OVARIES     TOOTH EXTRACTION     TUBAL LIGATION  1993   Patient Active Problem List   Diagnosis Date Noted   Bilateral hand pain 09/20/2020   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   Pain in left leg 09/20/2020   Esophageal dysphagia 09/30/2019   RUQ pain 05/27/2019   GERD  (gastroesophageal reflux disease) 05/27/2019   Constipation 05/27/2019   Rectal bleeding 05/27/2019   Cigarette nicotine dependence with nicotine-induced disorder 04/15/2015   Chronic obstructive pulmonary disease ([place]) 04/15/2015   Hyperlipidemia 04/15/2015   Abnormal mammogram 04/15/2015   Hematuria 03/07/2015   Abdominal pain, epigastric 03/07/2015   Cigarette nicotine dependence, uncomplicated 36/46/8032   History of substance abuse ([place]) 03/07/2015   Esophageal reflux 03/07/2015    PCP:   Soyla Dryer, PA-C    REFERRING PROVIDER: Carole Civil, MD [place]   REFERRING DIAG: [phone] (ICD-10-CM) - Chronic left-sided low back pain with left-sided sciatica   Rationale for Evaluation and Treatment Rehabilitation  THERAPY DIAG:  Chronic left-sided low back pain with left-sided sciatica  Difficulty in walking, not elsewhere classified  Low back pain, unspecified back pain laterality, unspecified chronicity, unspecified whether sciatica present  Other abnormalities of gait and mobility  ONSET DATE: 3-4 years  SUBJECTIVE:  SUBJECTIVE STATEMENT: Pt states she's been doing the exercises.  States she continues to keep her 51 year old grandson.  States her pain is in her LB and down the front and sides of each LE.   PERTINENT HISTORY:  No previous surgery or injections  PAIN:  Are you having pain? Yes: NPRS scale: 8/10 Pain location: low back and legs Pain description: hurts all the time; burning aching Aggravating factors: everything; prolonged positioning Relieving factors: unknown; "I don't do medicine"   PRECAUTIONS: None  WEIGHT BEARING RESTRICTIONS No  FALLS:  Has patient fallen in last 6 months? No  LIVING ENVIRONMENT: Lives with: lives  with an adult companion Lives in: Mobile home Stairs: Yes: External: 3 steps; on right going up, on left going up, and can reach both Has following equipment at home: Single point cane, Quad cane small base, and Walker - 4 wheeled  OCCUPATION: not working  PLOF: Independent  PATIENT GOALS walking good, less pain   OBJECTIVE:   DIAGNOSTIC FINDINGS:  CLINICAL DATA:  Low back pain. Cauda equina syndrome suspected. Bilateral leg pain extending to the hips.   EXAM: MRI LUMBAR SPINE WITHOUT CONTRAST   TECHNIQUE: Multiplanar, multisequence MR imaging of the lumbar spine was performed. No intravenous contrast was administered.   COMPARISON:  Radiography 07/31/2016   FINDINGS: Segmentation:  5 lumbar type vertebral bodies.   Alignment: 3 mm degenerative anterolisthesis L4-5 that could worsen with standing or flexion.   Vertebrae:  No fracture or focal bone lesion.   Conus medullaris and cauda equina: Conus extends to the T12-L1 level. Conus and cauda equina appear normal.   Paraspinal and other soft tissues: Negative   Disc levels:   No disc abnormality from T11-12 through L2-3. mild facet degeneration and hypertrophy at L2-3. No stenosis.   L3-4: Minimal disc bulge. Mild facet degeneration and hypertrophy. No stenosis.   L4-5: Advanced bilateral facet arthropathy with gaping fluid-filled joints. 3 mm of anterolisthesis that would likely worsen with standing or flexion. Mild stenosis of the lateral recesses and foramina but without definite neural compression. Some potential the right L4 nerve could be affected in the foramen. The facet arthritis could be a cause of back pain or referred facet syndrome pain.   L5-S1: Normal appearance of the disc. Bilateral facet osteoarthritis without slippage. 3 mm synovial cyst projecting inward from the facet on the right without compression of the neural structures. No stenosis at this level. Findings could relate to back pain  or referred facet syndrome pain.   IMPRESSION: L4-5: Advanced bilateral facet arthropathy with gaping fluid-filled joints. 3 mm of anterolisthesis that could worsen with standing or flexion. Mild bulging of the disc. Mild narrowing of the lateral recesses and foramina but no definite neural compression. Encroachment upon the foramen on the right is more pronounced on the left and it is possible the right L4 nerve could be affected. The facet arthropathy could relate to back pain or referred facet syndrome pain.   L5-S1: Bilateral facet osteoarthritis. No stenosis. Findings could relate to back pain or referred facet syndrome pain.   Lesser facet osteoarthritis at L2-3 and L3-4.     Electronically Signed   By: Nelson Chimes M.D.   On: 06/15/2021 10:14  PATIENT SURVEYS:  FOTO 24 %  COGNITION:  Overall cognitive status: Within functional limits for tasks assessed     SENSATION: Denies numbness   POSTURE: rounded shoulders, forward head, and guarded posturing  PALPATION: Tender lower lumbar spine paraspinals; quadratus  bilaterally; bilateral glute and piriformis tenderness  LUMBAR ROM:   Active  AROM  eval  Flexion 15% available **  Extension To neutral ** most painful movement  Right lateral flexion To mid thigh  Left lateral flexion To 3 inches above knee  Right rotation   Left rotation    (Blank rows = not tested)   LOWER EXTREMITY MMT:    MMT Right eval Left eval  Hip flexion 3- 3-  Hip extension    Hip abduction    Hip adduction    Hip internal rotation    Hip external rotation    Knee flexion    Knee extension 4 3+  Ankle dorsiflexion 4 3  Ankle plantarflexion    Ankle inversion    Ankle eversion     (Blank rows = not tested)  FUNCTIONAL TESTS:  5 times sit to stand: 47.44 2 MWT 122 ft  GAIT: Distance walked: 122 ft Assistive device utilized: Single point cane Level of assistance: Modified independence Comments: antalgic gait; slow gait  speed    TODAY'S TREATMENT  10/23/21 Goals and HEP review Seated:  Heel and toe raises 15 reps  LAQ 10X5" holds each  Marching 10X each Long sitting:  hamstring stretch 3X30" each Sit to stand no UE assist standard height chair 10X Bed mobility:  working on logroll technique Supine:  abdominal isometrics 10X5"  Bridge 10X  SLR 5X each with ab set  Evaluation:  Physical therapy evaluation and HEP instruction   PATIENT EDUCATION:  Education details: Patient educated on exam findings, POC, scope of PT, HEP. Person educated: Patient Education method: Explanation, Demonstration, and Handouts Education comprehension: verbalized understanding, returned demonstration, verbal cues required, and tactile cues required   HOME EXERCISE PROGRAM: Access Code: [facility] URL: [email or web address] Date: 10/11/2021 Prepared by: [facility]  Exercises - Sit to Stand with Counter Support  - 2 x daily - 7 x weekly - 2 sets - 5 reps - Seated Heel Toe Raises  - 2 x daily - 7 x weekly - 1 sets - 10 reps - Seated Long Arc Quad  - 2 x daily - 7 x weekly - 1 sets - 10 reps - Seated March  - 2 x daily - 7 x weekly - 1 sets - 10 reps  ASSESSMENT:  CLINICAL IMPRESSION: Reviewed goals, HEP and POC moving forward.  Symptoms unchanged from evaluation and reports compliance with HEP.  Began seated hamstring stretch with noted tightness bilaterally.  Pt uses UE to bring Rt LE onto mat due to weakness/pain.  Educated on logroll technique and began this for transfers with increased time to complete but no assistance needed from therapist.  Initiated core stability exercises with difficulty engaging core without holding breath. Minimal rise with bridge or SLR.  Encouraged to continue HEP and work on bed mobility with return to bed for sleeping rather than recliner.  Patient will benefit from continued skilled therapy services to address deficits and promote return to optimal function.         OBJECTIVE IMPAIRMENTS Abnormal gait, decreased activity tolerance, decreased endurance, decreased knowledge of condition, decreased mobility, difficulty walking, decreased ROM, decreased strength, hypomobility, increased fascial restrictions, impaired perceived functional ability, increased muscle spasms, impaired flexibility, postural dysfunction, and pain.   ACTIVITY LIMITATIONS carrying, lifting, bending, sitting, standing, squatting, sleeping, stairs, transfers, bed mobility, bathing, dressing, hygiene/grooming, locomotion level, and caring for others  PARTICIPATION LIMITATIONS: meal prep, cleaning, laundry, driving, shopping, community activity, occupation, and yard work  PERSONAL FACTORS Fitness are also affecting patient's functional outcome.   REHAB POTENTIAL: Good  CLINICAL DECISION MAKING: Stable/uncomplicated  EVALUATION COMPLEXITY: Low   GOALS: Goals reviewed with patient? Yes  SHORT TERM GOALS: Target date: 10/25/2021   patient will be independent with initial HEP  Baseline: Goal status: IN PROGRESS  2.  Patient will improve 5 x STS score from 47.44 sec to 40 sec to demonstrate improved functional mobility and increased lower extremity strength.  Baseline:  Goal status: IN PROGRESS   LONG TERM GOALS: Target date: 11/08/2021  Patient will report at least 50% improvement in overall symptoms and/or function to demonstrate improved functional mobility  Baseline:  Goal status: IN PROGRESS  2.  Patient will improve FOTO score to predicted value to demonstrate improved functional mobility  Baseline: 24 Goal status: IN PROGRESS  3.  Patient will improve 5 x STS score from 47.44 sec to 30 sec to demonstrate improved functional mobility and increased lower extremity strength.  Baseline:  Goal status: IN PROGRESS  4.  Patient will increase distance on 2MWT to 226 ft to demonstrate improved functional gait and mobility  Baseline:  Goal status: IN  PROGRESS  5.   Patient will increase bilateral MMTs to 4/5-5/5 to promote return to ambulation community distances with minimal deviation Baseline: see above Goal status: IN PROGRESS  6.  Patient will be able to tolerate lying supine or sidelying without increased pain to allow patient to be more comfortable trying to sleep in bed. Baseline:  Goal status: IN PROGRESS   PLAN: PT FREQUENCY: 2x/week  PT DURATION: 4 weeks  PLANNED INTERVENTIONS: Therapeutic exercises, Therapeutic activity, Neuromuscular re-education, Balance training, Gait training, Patient/Family education, Joint manipulation, Joint mobilization, Stair training, Orthotic/Fit training, DME instructions, Aquatic Therapy, Dry Needling, Electrical stimulation, Spinal manipulation, Spinal mobilization, Cryotherapy, Moist heat, Compression bandaging, scar mobilization, Splintting, Taping, Traction, Ultrasound, Ionotophoresis '4mg'$ /ml Dexamethasone, and Manual therapy  PLAN FOR NEXT SESSION:   Progress lumbar mobility and core and leg strengthening as able.    11:16 AM, 10/23/21 Teena Irani, PTA/CLT [place] Ph: [phone]

## 2021-10-25 ENCOUNTER — Ambulatory Visit (HOSPITAL_COMMUNITY): Payer: Self-pay | Admitting: Physical Therapy

## 2021-10-25 ENCOUNTER — Encounter (HOSPITAL_COMMUNITY): Payer: Self-pay | Admitting: Physical Therapy

## 2021-10-25 DIAGNOSIS — M79605 Pain in left leg: Secondary | ICD-10-CM

## 2021-10-25 DIAGNOSIS — M545 Low back pain, unspecified: Secondary | ICD-10-CM

## 2021-10-25 DIAGNOSIS — M79604 Pain in right leg: Secondary | ICD-10-CM

## 2021-10-25 DIAGNOSIS — R262 Difficulty in walking, not elsewhere classified: Secondary | ICD-10-CM

## 2021-10-25 DIAGNOSIS — R2689 Other abnormalities of gait and mobility: Secondary | ICD-10-CM

## 2021-10-25 NOTE — Therapy (Signed)
OUTPATIENT PHYSICAL THERAPY TREATMENT   Patient Name: Pamela Livingston MRN: [id number] DOB:11-28-1965, 56 y.o., female Today's Date: 10/25/2021   PT End of Session - 10/25/21 0821     Visit Number 3    Number of Visits 8    Date for PT Re-Evaluation 11/08/21    Authorization Type [facility] Financial Assistance letter for coverage    Progress Note Due on Visit 8    PT Start Time 0815    PT Stop Time 0858    PT Time Calculation (min) 43 min    Activity Tolerance Patient tolerated treatment well    Behavior During Therapy [facility] for tasks assessed/performed              Past Medical History:  Diagnosis Date   Asthma    Chronic pelvic pain in female    COPD (chronic obstructive pulmonary disease) ([place])    Endometriosis    Polysubstance abuse ([place])    marijuana, cocaine, benzos   Stomach ulcer    Suicide attempt ([place]) 2008   by phenergan overdose   Syncope    Past Surgical History:  Procedure Laterality Date   ABDOMINAL HYSTERECTOMY     BALLOON DILATION N/A 07/26/2020   Procedure: BALLOON DILATION;  Surgeon: Eloise Harman, DO;  [facility];  Service: Endoscopy;  Laterality: N/A;   BIOPSY  07/26/2020   Procedure: BIOPSY;  Surgeon: Eloise Harman, DO;  [facility];  Service: Endoscopy;;   CERVICAL CONE BIOPSY     cervical cancer   CESAREAN SECTION  1993   CHOLECYSTECTOMY     COLONOSCOPY WITH PROPOFOL N/A 07/26/2020   Procedure: COLONOSCOPY WITH PROPOFOL;  Surgeon: Eloise Harman, DO;  [facility];  Service: Endoscopy;  Laterality: N/A;  12:00pm   ESOPHAGOGASTRODUODENOSCOPY (EGD) WITH PROPOFOL N/A 07/26/2020   Procedure: ESOPHAGOGASTRODUODENOSCOPY (EGD) WITH PROPOFOL;  Surgeon: Eloise Harman, DO;  [facility];  Service: Endoscopy;  Laterality: N/A;   RADICAL HYSTERECTOMY WITH TRANSPOSITION OF OVARIES     TOOTH EXTRACTION     TUBAL LIGATION  1993   Patient Active Problem List   Diagnosis Date Noted   Bilateral hand pain  09/20/2020   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   Pain in left leg 09/20/2020   Esophageal dysphagia 09/30/2019   RUQ pain 05/27/2019   GERD (gastroesophageal reflux disease) 05/27/2019   Constipation 05/27/2019   Rectal bleeding 05/27/2019   Cigarette nicotine dependence with nicotine-induced disorder 04/15/2015   Chronic obstructive pulmonary disease ([place]) 04/15/2015   Hyperlipidemia 04/15/2015   Abnormal mammogram 04/15/2015   Hematuria 03/07/2015   Abdominal pain, epigastric 03/07/2015   Cigarette nicotine dependence, uncomplicated 38/45/3646   History of substance abuse ([place]) 03/07/2015   Esophageal reflux 03/07/2015    PCP:   Soyla Dryer, PA-C    REFERRING PROVIDER: Carole Civil, MD [place]   REFERRING DIAG: [phone] (ICD-10-CM) - Chronic left-sided low back pain with left-sided sciatica   Rationale for Evaluation and Treatment Rehabilitation  THERAPY DIAG:  Low back pain, unspecified back pain laterality, unspecified chronicity, unspecified whether sciatica present  Other abnormalities of gait and mobility  Difficulty in walking, not elsewhere classified  Pain in right leg  Pain in left leg  ONSET DATE: 3-4 years  SUBJECTIVE:  SUBJECTIVE STATEMENT: Has been very busy taking care of her father. Feels fine with exercises, does not feel it increases her symptoms.  PERTINENT HISTORY:  No previous surgery or injections  PAIN:  Are you having pain? Yes: NPRS scale: 9/10 Pain location: low back and legs Pain description: hurts all the time; burning aching Aggravating factors: everything; prolonged positioning Relieving factors: unknown; "I don't do medicine"   PRECAUTIONS: None  WEIGHT BEARING RESTRICTIONS No  FALLS:  Has patient  fallen in last 6 months? No  LIVING ENVIRONMENT: Lives with: lives with an adult companion Lives in: Mobile home Stairs: Yes: External: 3 steps; on right going up, on left going up, and can reach both Has following equipment at home: Single point cane, Quad cane small base, and Walker - 4 wheeled  OCCUPATION: not working  PLOF: Independent  PATIENT GOALS walking good, less pain   OBJECTIVE:   DIAGNOSTIC FINDINGS:  CLINICAL DATA:  Low back pain. Cauda equina syndrome suspected. Bilateral leg pain extending to the hips.   EXAM: MRI LUMBAR SPINE WITHOUT CONTRAST   TECHNIQUE: Multiplanar, multisequence MR imaging of the lumbar spine was performed. No intravenous contrast was administered.   COMPARISON:  Radiography 07/31/2016   FINDINGS: Segmentation:  5 lumbar type vertebral bodies.   Alignment: 3 mm degenerative anterolisthesis L4-5 that could worsen with standing or flexion.   Vertebrae:  No fracture or focal bone lesion.   Conus medullaris and cauda equina: Conus extends to the T12-L1 level. Conus and cauda equina appear normal.   Paraspinal and other soft tissues: Negative   Disc levels:   No disc abnormality from T11-12 through L2-3. mild facet degeneration and hypertrophy at L2-3. No stenosis.   L3-4: Minimal disc bulge. Mild facet degeneration and hypertrophy. No stenosis.   L4-5: Advanced bilateral facet arthropathy with gaping fluid-filled joints. 3 mm of anterolisthesis that would likely worsen with standing or flexion. Mild stenosis of the lateral recesses and foramina but without definite neural compression. Some potential the right L4 nerve could be affected in the foramen. The facet arthritis could be a cause of back pain or referred facet syndrome pain.   L5-S1: Normal appearance of the disc. Bilateral facet osteoarthritis without slippage. 3 mm synovial cyst projecting inward from the facet on the right without compression of the neural  structures. No stenosis at this level. Findings could relate to back pain or referred facet syndrome pain.   IMPRESSION: L4-5: Advanced bilateral facet arthropathy with gaping fluid-filled joints. 3 mm of anterolisthesis that could worsen with standing or flexion. Mild bulging of the disc. Mild narrowing of the lateral recesses and foramina but no definite neural compression. Encroachment upon the foramen on the right is more pronounced on the left and it is possible the right L4 nerve could be affected. The facet arthropathy could relate to back pain or referred facet syndrome pain.   L5-S1: Bilateral facet osteoarthritis. No stenosis. Findings could relate to back pain or referred facet syndrome pain.   Lesser facet osteoarthritis at L2-3 and L3-4.     Electronically Signed   By: Nelson Chimes M.D.   On: 06/15/2021 10:14  PATIENT SURVEYS:  FOTO 24 %  COGNITION:  Overall cognitive status: Within functional limits for tasks assessed     SENSATION: Denies numbness   POSTURE: rounded shoulders, forward head, and guarded posturing  PALPATION: Tender lower lumbar spine paraspinals; quadratus bilaterally; bilateral glute and piriformis tenderness  LUMBAR ROM:   Active  AROM  eval  Flexion 15% available **  Extension To neutral ** most painful movement  Right lateral flexion To mid thigh  Left lateral flexion To 3 inches above knee  Right rotation   Left rotation    (Blank rows = not tested)   LOWER EXTREMITY MMT:    MMT Right eval Left eval  Hip flexion 3- 3-  Hip extension    Hip abduction    Hip adduction    Hip internal rotation    Hip external rotation    Knee flexion    Knee extension 4 3+  Ankle dorsiflexion 4 3  Ankle plantarflexion    Ankle inversion    Ankle eversion     (Blank rows = not tested)  FUNCTIONAL TESTS:  5 times sit to stand: 47.44 2 MWT 122 ft  GAIT: Distance walked: 122 ft Assistive device utilized: Single point cane Level  of assistance: Modified independence Comments: antalgic gait; slow gait speed    TODAY'S TREATMENT  10/25/21 Reviewed HEP, updated - Hooklying lumbar Rotation  x10 - Supine Pelvic Tilt  x10 A/P - Hooklying Abdominal Bracing 3x10 - Hooklying Hamstring Stretch with Strap  2x30" - Seated ball press 3x10 - Seated hip adduction squeeze 3x10  10/23/21 Goals and HEP review Seated:  Heel and toe raises 15 reps  LAQ 10X5" holds each  Marching 10X each Long sitting:  hamstring stretch 3X30" each Sit to stand no UE assist standard height chair 10X Bed mobility:  working on logroll technique Supine:  abdominal isometrics 10X5"  Bridge 10X  SLR 5X each with ab set  Evaluation:  Physical therapy evaluation and HEP instruction   PATIENT EDUCATION:  Education details: Patient educated on exam findings, POC, scope of PT, HEP. Person educated: Patient Education method: Explanation, Demonstration, and Handouts Education comprehension: verbalized understanding, returned demonstration, verbal cues required, and tactile cues required   HOME EXERCISE PROGRAM: Access Code: [email or web address] URL: [email or web address]  Date 10/25/21 - Supine Lower Trunk Rotation  - 2 x daily - 7 x weekly - 1 sets - 10 reps - Supine Pelvic Tilt  - 2 x daily - 7 x weekly - 1 sets - 10 reps - Supine Transversus Abdominis Bracing - Hands on Stomach  - 2 x daily - 7 x weekly - 3 sets - 10 reps - Hooklying Hamstring Stretch with Strap  - 2 x daily - 7 x weekly - 3 sets - 15-30 seconds hold  Date: 10/11/2021 Exercises - Sit to Stand with Counter Support  - 2 x daily - 7 x weekly - 2 sets - 5 reps - Seated Heel Toe Raises  - 2 x daily - 7 x weekly - 1 sets - 10 reps - Seated Long Arc Quad  - 2 x daily - 7 x weekly - 1 sets - 10 reps - Seated March  - 2 x daily - 7 x weekly - 1 sets - 10 reps  ASSESSMENT:  CLINICAL IMPRESSION: Reviewed log roll technique, little recall of HEP. Educated on symptom awareness  and to avoid peripheralizing positions and activities. Progressed exercises for core stabilization, gentle ROM lumbar spine. Pt also having some + Rt hip symptoms, she report hx of OA. Patient will continue to benefit from skilled physical therapy services to further improve independence with functional mobility.      OBJECTIVE IMPAIRMENTS Abnormal gait, decreased activity tolerance, decreased endurance, decreased knowledge of condition, decreased mobility, difficulty walking, decreased ROM, decreased strength, hypomobility, increased fascial restrictions, impaired perceived functional ability,  increased muscle spasms, impaired flexibility, postural dysfunction, and pain.   ACTIVITY LIMITATIONS carrying, lifting, bending, sitting, standing, squatting, sleeping, stairs, transfers, bed mobility, bathing, dressing, hygiene/grooming, locomotion level, and caring for others  PARTICIPATION LIMITATIONS: meal prep, cleaning, laundry, driving, shopping, community activity, occupation, and yard work  [place] are also affecting patient's functional outcome.   REHAB POTENTIAL: Good  CLINICAL DECISION MAKING: Stable/uncomplicated  EVALUATION COMPLEXITY: Low   GOALS: Goals reviewed with patient? Yes  SHORT TERM GOALS: Target date: 10/25/2021   patient will be independent with initial HEP  Baseline: Goal status: IN PROGRESS  2.  Patient will improve 5 x STS score from 47.44 sec to 40 sec to demonstrate improved functional mobility and increased lower extremity strength.  Baseline:  Goal status: IN PROGRESS   LONG TERM GOALS: Target date: 11/08/2021  Patient will report at least 50% improvement in overall symptoms and/or function to demonstrate improved functional mobility  Baseline:  Goal status: IN PROGRESS  2.  Patient will improve FOTO score to predicted value to demonstrate improved functional mobility  Baseline: 24 Goal status: IN PROGRESS  3.  Patient will  improve 5 x STS score from 47.44 sec to 30 sec to demonstrate improved functional mobility and increased lower extremity strength.  Baseline:  Goal status: IN PROGRESS  4.  Patient will increase distance on 2MWT to 226 ft to demonstrate improved functional gait and mobility  Baseline:  Goal status: IN PROGRESS  5.   Patient will increase bilateral MMTs to 4/5-5/5 to promote return to ambulation community distances with minimal deviation Baseline: see above Goal status: IN PROGRESS  6.  Patient will be able to tolerate lying supine or sidelying without increased pain to allow patient to be more comfortable trying to sleep in bed. Baseline:  Goal status: IN PROGRESS   PLAN: PT FREQUENCY: 2x/week  PT DURATION: 4 weeks  PLANNED INTERVENTIONS: Therapeutic exercises, Therapeutic activity, Neuromuscular re-education, Balance training, Gait training, Patient/Family education, Joint manipulation, Joint mobilization, Stair training, Orthotic/Fit training, DME instructions, Aquatic Therapy, Dry Needling, Electrical stimulation, Spinal manipulation, Spinal mobilization, Cryotherapy, Moist heat, Compression bandaging, scar mobilization, Splintting, Taping, Traction, Ultrasound, Ionotophoresis '4mg'$ /ml Dexamethasone, and Manual therapy  PLAN FOR NEXT SESSION:   Progress lumbar mobility and core and leg strengthening as able.    9:01 AM, 10/25/21 Candie Mile, PT, DPT Physical Therapist [facility] [place] [facility]

## 2021-10-31 ENCOUNTER — Telehealth (HOSPITAL_COMMUNITY): Payer: Self-pay | Admitting: Physical Therapy

## 2021-10-31 ENCOUNTER — Encounter (HOSPITAL_COMMUNITY): Payer: Medicaid Other | Admitting: Physical Therapy

## 2021-10-31 NOTE — Telephone Encounter (Signed)
Pt did not show for appt.  Called and spoke to patient who states she had forgotten to call and cancel today.  Reminded of next scheduled appt.  Teena Irani, PTA/CLT [place] Ph: [phone]

## 2021-11-02 ENCOUNTER — Encounter (HOSPITAL_COMMUNITY): Payer: Medicaid Other | Admitting: Physical Therapy

## 2021-11-07 ENCOUNTER — Encounter (HOSPITAL_COMMUNITY): Payer: Medicaid Other

## 2021-11-09 ENCOUNTER — Encounter (HOSPITAL_COMMUNITY): Payer: Medicaid Other

## 2021-11-09 ENCOUNTER — Other Ambulatory Visit: Payer: Self-pay

## 2021-11-09 ENCOUNTER — Telehealth: Payer: Self-pay | Admitting: Student

## 2021-11-09 ENCOUNTER — Encounter (HOSPITAL_COMMUNITY): Payer: Self-pay

## 2021-11-09 ENCOUNTER — Emergency Department (HOSPITAL_COMMUNITY)
Admission: EM | Admit: 2021-11-09 | Discharge: 2021-11-09 | Discharge status: Against Medical Advice | Payer: Medicaid Other | Attending: Emergency Medicine | Admitting: Emergency Medicine

## 2021-11-09 ENCOUNTER — Emergency Department (HOSPITAL_COMMUNITY): Payer: Medicaid Other

## 2021-11-09 DIAGNOSIS — R0602 Shortness of breath: Secondary | ICD-10-CM | POA: Insufficient documentation

## 2021-11-09 DIAGNOSIS — R0789 Other chest pain: Secondary | ICD-10-CM | POA: Insufficient documentation

## 2021-11-09 DIAGNOSIS — Z5321 Procedure and treatment not carried out due to patient leaving prior to being seen by health care provider: Secondary | ICD-10-CM | POA: Insufficient documentation

## 2021-11-09 DIAGNOSIS — R079 Chest pain, unspecified: Secondary | ICD-10-CM

## 2021-11-09 DIAGNOSIS — R110 Nausea: Secondary | ICD-10-CM | POA: Insufficient documentation

## 2021-11-09 DIAGNOSIS — Z20822 Contact with and (suspected) exposure to covid-19: Secondary | ICD-10-CM | POA: Insufficient documentation

## 2021-11-09 LAB — HEPATIC FUNCTION PANEL
ALT: 9 U/L (ref 0–44)
AST: 12 U/L — ABNORMAL LOW (ref 15–41)
Albumin: 3.7 g/dL (ref 3.5–5.0)
Alkaline Phosphatase: 76 U/L (ref 38–126)
Bilirubin, Direct: 0.1 mg/dL (ref 0.0–0.2)
Indirect Bilirubin: 0.5 mg/dL (ref 0.3–0.9)
Total Bilirubin: 0.6 mg/dL (ref 0.3–1.2)
Total Protein: 7 g/dL (ref 6.5–8.1)

## 2021-11-09 LAB — CBC
HCT: 36.3 % (ref 36.0–46.0)
Hemoglobin: 12.3 g/dL (ref 12.0–15.0)
MCH: 31.6 pg (ref 26.0–34.0)
MCHC: 33.9 g/dL (ref 30.0–36.0)
MCV: 93.3 fL (ref 80.0–100.0)
Platelets: 344 10*3/uL (ref 150–400)
RBC: 3.89 MIL/uL (ref 3.87–5.11)
RDW: 13 % (ref 11.5–15.5)
WBC: 13.9 10*3/uL — ABNORMAL HIGH (ref 4.0–10.5)
nRBC: 0 % (ref 0.0–0.2)

## 2021-11-09 LAB — URINALYSIS, ROUTINE W REFLEX MICROSCOPIC
Bilirubin Urine: NEGATIVE
Glucose, UA: NEGATIVE mg/dL
Ketones, ur: NEGATIVE mg/dL
Leukocytes,Ua: NEGATIVE
Nitrite: NEGATIVE
Protein, ur: 100 mg/dL — AB
RBC / HPF: >50 RBC/hpf — ABNORMAL HIGH (ref 0–5)
Specific Gravity, Urine: 1.035 — ABNORMAL HIGH (ref 1.005–1.030)
pH: 5 (ref 5.0–8.0)

## 2021-11-09 LAB — BASIC METABOLIC PANEL
Anion gap: 12 (ref 5–15)
BUN: 9 mg/dL (ref 6–20)
CO2: 20 mmol/L — ABNORMAL LOW (ref 22–32)
Calcium: 9.3 mg/dL (ref 8.9–10.3)
Chloride: 106 mmol/L (ref 98–111)
Creatinine, Ser: 0.76 mg/dL (ref 0.44–1.00)
GFR, Estimated: >60 mL/min (ref >60)
Glucose, Bld: 118 mg/dL — ABNORMAL HIGH (ref 70–99)
Potassium: 2.9 mmol/L — ABNORMAL LOW (ref 3.5–5.1)
Sodium: 138 mmol/L (ref 135–145)

## 2021-11-09 LAB — LIPASE, BLOOD: Lipase: 20 U/L (ref 11–51)

## 2021-11-09 LAB — RESP PANEL BY RT-PCR (FLU A&B, COVID) ARPGX2
Influenza A by PCR: NEGATIVE
Influenza B by PCR: NEGATIVE
SARS Coronavirus 2 by RT PCR: NEGATIVE

## 2021-11-09 LAB — TROPONIN I (HIGH SENSITIVITY)
Troponin I (High Sensitivity): 2 ng/L (ref <18)
Troponin I (High Sensitivity): 2 ng/L (ref <18)

## 2021-11-09 LAB — POC URINE PREG, ED: Preg Test, Ur: NEGATIVE

## 2021-11-09 MED ORDER — SODIUM CHLORIDE 0.9 % IV BOLUS
1000.0000 mL | Freq: Once | INTRAVENOUS | Status: AC
Start: 2021-11-09 — End: 2021-11-09
  Administered 2021-11-09: 1000 mL via INTRAVENOUS

## 2021-11-09 MED ORDER — POTASSIUM CHLORIDE 20 MEQ PO PACK
60.0000 meq | PACK | Freq: Once | ORAL | Status: AC
  Administered 2021-11-09: 60 meq via ORAL
  Filled 2021-11-09: qty 3

## 2021-11-09 MED ORDER — ACETAMINOPHEN 325 MG PO TABS: 650.0000 mg | ORAL_TABLET | Freq: Once | ORAL | Status: DC

## 2021-11-09 MED ORDER — ONDANSETRON HCL 4 MG/2ML IJ SOLN
4.0000 mg | Freq: Once | INTRAMUSCULAR | Status: AC
  Administered 2021-11-09: 4 mg via INTRAVENOUS
  Filled 2021-11-09: qty 2

## 2021-11-09 NOTE — ED Provider Notes (Signed)
[facility] EMERGENCY DEPARTMENT Provider Note   CSN: [id number] Arrival date & time: 11/09/21  1135     History Chief Complaint  Patient presents with   Chest Pain    Pamela Livingston is a 56 y.o. female patient with history of polysubstance abuse and COPD who presents to the emergency department today for further evaluation of nausea, vomiting, and diffuse sharp chest pain.  Patient states that yesterday she began having intractable nausea and vomiting.  In addition, around 3 AM this morning she began having diffuse chest pain which has been constant since onset.  She also endorses diffuse myalgias, and abdominal cramping when she has vomiting.  She denies any urinary symptoms, diarrhea, fever, chills.  Patient does admit to cannabis use but denies any other drug or alcohol use.   Chest Pain      Home Medications Prior to Admission medications   Medication Sig Start Date End Date Taking? Authorizing Provider  acetaminophen (TYLENOL) 500 MG tablet Take 1,500 mg by mouth every 6 (six) hours as needed for moderate pain.   Yes [provider]  albuterol (PROVENTIL) (2.5 MG/3ML) 0.083% nebulizer solution INHALE 1 VIAL VIA NEBULIZER EVERY 6 HOURS AS NEEDED FOR WHEEZING OR SHORTNESS OF BREATH 10/23/21  Yes Soyla Dryer, PA-C  albuterol (VENTOLIN HFA) 108 (90 Base) MCG/ACT inhaler INHALE 2 PUFFS BY MOUTH EVERY 6 HOURS AS NEEDED FOR COUGHING, WHEEZING, OR SHORTNESS OF BREATH 10/23/21  Yes Soyla Dryer, PA-C  atorvastatin (LIPITOR) 20 MG tablet TAKE 1 Tablet BY MOUTH ONCE DAILY 10/23/21  Yes Soyla Dryer, PA-C  diphenhydrAMINE (BENADRYL) 25 MG tablet Take 50 mg by mouth at bedtime.   Yes [provider]  fluticasone-salmeterol (ADVAIR) 100-50 MCG/ACT AEPB INHALE 1 PUFF BY MOUTH TWICE DAILY (EVERY 12 HOURS). RINSE MOUTH AFTER EACH USE. (EVERY MORNING AND EVERY NIGHT AT BEDTIME) 10/23/21  Yes Soyla Dryer, PA-C  guaiFENesin (MUCINEX) 600 MG 12 hr tablet Take by mouth 2  (two) times daily.   Yes [provider]  lisinopril (ZESTRIL) 20 MG tablet TAKE 1 Tablet BY MOUTH ONCE DAILY 10/23/21  Yes Soyla Dryer, PA-C  loratadine-pseudoephedrine (CLARITIN-D 24-HOUR) 10-240 MG 24 hr tablet Take 1 tablet by mouth in the morning.    Yes [provider]  meloxicam (MOBIC) 7.5 MG tablet Take 1 tablet by mouth once daily 09/12/21  Yes Carole Civil, MD  montelukast (SINGULAIR) 10 MG tablet TAKE 1 Tablet BY MOUTH ONCE DAILY AS NEEDED 10/23/21  Yes Soyla Dryer, PA-C  nitroGLYCERIN (NITROSTAT) 0.4 MG SL tablet Place 1 tablet (0.4 mg total) under the tongue every 5 (five) minutes as needed for chest pain. 10/02/18  Yes Soyla Dryer, PA-C  omeprazole (PRILOSEC) 40 MG capsule TAKE 1 Capsule  BY MOUTH TWICE DAILY 10/23/21  Yes Soyla Dryer, PA-C  cyclobenzaprine (FLEXERIL) 10 MG tablet Take 1 tablet (10 mg total) by mouth 3 (three) times daily as needed for muscle spasms. Patient not taking: Reported on 09/13/2021 07/18/21   Soyla Dryer, PA-C  meloxicam (MOBIC) 7.5 MG tablet Take 1 tablet (7.5 mg total) by mouth daily. Patient not taking: Reported on 09/13/2021 09/12/21   Carole Civil, MD      Allergies    Asa [aspirin] and Codeine    Review of Systems   Review of Systems  Cardiovascular:  Positive for chest pain.    Physical Exam Updated Vital Signs BP (!) 139/95   Pulse 90   Temp 97.8 F (36.6 C) (Oral)  Resp 20   Ht '4\' 11"'$  (1.499 m)   Wt 74.8 kg   SpO2 100%   BMI 33.33 kg/m  Physical Exam  ED Results / Procedures / Treatments   Labs (all labs ordered are listed, but only abnormal results are displayed) Labs Reviewed  BASIC METABOLIC PANEL - Abnormal; Notable for the following components:      Result Value   Potassium 2.9 (*)    CO2 20 (*)    Glucose, Bld 118 (*)    All other components within normal limits  CBC - Abnormal; Notable for the following components:   WBC 13.9 (*)    All other components within  normal limits  HEPATIC FUNCTION PANEL  LIPASE, BLOOD  POC URINE PREG, ED  TROPONIN I (HIGH SENSITIVITY)  TROPONIN I (HIGH SENSITIVITY)    EKG None  Radiology DG Chest 2 View  Result Date: 11/09/2021 CLINICAL DATA:  Mid chest tightness. Shortness of breath and nausea. Vomiting yesterday. EXAM: CHEST - 2 VIEW COMPARISON:  Chest two views 05/17/2021 and 12/20/2019 FINDINGS: Cardiac silhouette is again mildly enlarged. Mediastinal contours are within normal limits with mild calcification within aortic arch. New right mid to lower lung horizontal linear subsegmental atelectasis. No focal airspace opacity to indicate pneumonia. No pleural effusion or pneumothorax. Mild-to-moderate multilevel degenerative disc changes of the thoracic spine. Likely cholecystectomy clips. IMPRESSION: New right mid to lower lung subsegmental atelectasis. No radiographic evidence of pneumonia. Electronically Signed   By: Yvonne Kendall M.D.   On: 11/09/2021 12:37    Procedures Procedures    Medications Ordered in ED Medications  ondansetron (ZOFRAN) injection 4 mg (has no administration in time range)  sodium chloride 0.9 % bolus 1,000 mL (has no administration in time range)    ED Course/ Medical Decision Making/ A&P Clinical Course as of 11/16/21 0902  Thu Nov 09, 2021  1738 CBC(!) There is evidence of leukocytosis. [CF]  1739 Hepatic function panel(!) Normal. [CF]  5176 Basic metabolic panel(!) Mild hypokalemia. [CF]  1739 Resp Panel by RT-PCR (Flu A&B, Covid) Anterior Nasal Swab Normal. [CF]  1740 Troponin I (High Sensitivity) Initial and delta troponin are negative. [CF]  1607 POC urine preg, ED Negative. [CF]  1741 Urinalysis, Routine w reflex microscopic Urine, Clean Catch(!) There is evidence of hematuria.  Old records reveal that the patient does have chronic hematuria based on previous results.  Patient does have evidence of pyuria with some evidence of contamination with squamous cells.   However, given her leukocytosis I will likely treat her for UTI. [CF]  1742 In the process of printing out her discharge paperwork send prescribing her antibiotics, the patient was very disgruntled and left.  She left prior to me going over the results with her and offering her antibiotic treatment for her UTI. [CF]    Clinical Course User Index [CF] Hendricks Limes, PA-C                           Medical Decision Making Amount and/or Complexity of Data Reviewed Labs: ordered. Decision-making details documented in ED Course. Radiology: ordered.  Risk Prescription drug management.    Final Clinical Impression(s) / ED Diagnoses Final diagnoses:  None    Rx / DC Orders ED Discharge Orders     None         Hendricks Limes, [place] 11/16/21 3710    Varney Biles, MD 11/16/21 1018

## 2021-11-09 NOTE — ED Notes (Signed)
Pt drank 3/4th cup of water and ate 1 cracker without vomiting. Pt requesting tylenol for headache. PA aware.

## 2021-11-09 NOTE — ED Notes (Signed)
240 ml of water given for fluid challenge.

## 2021-11-09 NOTE — ED Triage Notes (Signed)
Pt presents to ED with complaints of mid chest tightness started at 0300 with SOB and nausea. Pt states she was vomiting yesterday

## 2021-11-09 NOTE — ED Notes (Addendum)
Pt came up to nurses station, and in a raised voice, stated "I need someone to get this damn IV out now, I am going home this is ridiculous I have not even gotten a damn tylenol and I don't even want my discharge papers, I am going home get this IV out" This RN saw tylenol was ordered and offered the pt tylenol; However, she refused. I discussed the risks of leaving to pt and she verbalized understanding. Pt signed AMA form and IV was taken out. MD made aware

## 2021-11-09 NOTE — Telephone Encounter (Signed)
Pt called stating she was vomiting all day yesterday and continued vomitting until late last night. Pt started with cp around 3am and has a "hard time breathing". Pt states this morning she called 911 and EMT's came to her home and performed an EKG which they told her was normal. Pt states she has been using her nebulizer which helps a little. Today pt has no emesis only nausea, cp and difficulty breathing.  Pt was recommended to go to ER. Pt verbalized understanding.

## 2021-11-09 NOTE — ED Notes (Signed)
Crackers given to pt ?

## 2021-11-10 ENCOUNTER — Other Ambulatory Visit: Payer: Self-pay | Admitting: Physician Assistant

## 2021-11-10 DIAGNOSIS — Z1231 Encounter for screening mammogram for malignant neoplasm of breast: Secondary | ICD-10-CM

## 2021-11-13 ENCOUNTER — Emergency Department (HOSPITAL_COMMUNITY): Payer: Medicaid Other

## 2021-11-13 ENCOUNTER — Inpatient Hospital Stay (HOSPITAL_COMMUNITY)
Admission: EM | Admit: 2021-11-13 | Discharge: 2021-11-15 | DRG: 378 | Disposition: A | Discharge status: Home / Self Care | Payer: Self-pay | Attending: Family Medicine | Admitting: Family Medicine

## 2021-11-13 ENCOUNTER — Other Ambulatory Visit: Payer: Self-pay

## 2021-11-13 ENCOUNTER — Encounter (HOSPITAL_COMMUNITY): Payer: Self-pay

## 2021-11-13 DIAGNOSIS — Z886 Allergy status to analgesic agent status: Secondary | ICD-10-CM

## 2021-11-13 DIAGNOSIS — D62 Acute posthemorrhagic anemia: Secondary | ICD-10-CM | POA: Diagnosis present

## 2021-11-13 DIAGNOSIS — E669 Obesity, unspecified: Secondary | ICD-10-CM | POA: Diagnosis present

## 2021-11-13 DIAGNOSIS — Z791 Long term (current) use of non-steroidal anti-inflammatories (NSAID): Secondary | ICD-10-CM

## 2021-11-13 DIAGNOSIS — Z823 Family history of stroke: Secondary | ICD-10-CM

## 2021-11-13 DIAGNOSIS — Z79899 Other long term (current) drug therapy: Secondary | ICD-10-CM

## 2021-11-13 DIAGNOSIS — I10 Essential (primary) hypertension: Secondary | ICD-10-CM | POA: Diagnosis present

## 2021-11-13 DIAGNOSIS — Z7951 Long term (current) use of inhaled steroids: Secondary | ICD-10-CM

## 2021-11-13 DIAGNOSIS — K922 Gastrointestinal hemorrhage, unspecified: Principal | ICD-10-CM | POA: Diagnosis present

## 2021-11-13 DIAGNOSIS — F1911 Other psychoactive substance abuse, in remission: Secondary | ICD-10-CM | POA: Diagnosis present

## 2021-11-13 DIAGNOSIS — K921 Melena: Secondary | ICD-10-CM

## 2021-11-13 DIAGNOSIS — M1990 Unspecified osteoarthritis, unspecified site: Secondary | ICD-10-CM | POA: Diagnosis present

## 2021-11-13 DIAGNOSIS — R112 Nausea with vomiting, unspecified: Secondary | ICD-10-CM | POA: Diagnosis present

## 2021-11-13 DIAGNOSIS — R1115 Cyclical vomiting syndrome unrelated to migraine: Secondary | ICD-10-CM

## 2021-11-13 DIAGNOSIS — J449 Chronic obstructive pulmonary disease, unspecified: Secondary | ICD-10-CM | POA: Diagnosis present

## 2021-11-13 DIAGNOSIS — Z833 Family history of diabetes mellitus: Secondary | ICD-10-CM

## 2021-11-13 DIAGNOSIS — Z8541 Personal history of malignant neoplasm of cervix uteri: Secondary | ICD-10-CM

## 2021-11-13 DIAGNOSIS — R319 Hematuria, unspecified: Secondary | ICD-10-CM | POA: Diagnosis present

## 2021-11-13 DIAGNOSIS — Z6833 Body mass index (BMI) 33.0-33.9, adult: Secondary | ICD-10-CM

## 2021-11-13 DIAGNOSIS — E872 Acidosis, unspecified: Secondary | ICD-10-CM | POA: Diagnosis present

## 2021-11-13 DIAGNOSIS — R197 Diarrhea, unspecified: Secondary | ICD-10-CM | POA: Diagnosis present

## 2021-11-13 DIAGNOSIS — D72829 Elevated white blood cell count, unspecified: Secondary | ICD-10-CM | POA: Diagnosis present

## 2021-11-13 DIAGNOSIS — Z8249 Family history of ischemic heart disease and other diseases of the circulatory system: Secondary | ICD-10-CM

## 2021-11-13 DIAGNOSIS — K317 Polyp of stomach and duodenum: Secondary | ICD-10-CM

## 2021-11-13 DIAGNOSIS — E8729 Other acidosis: Secondary | ICD-10-CM

## 2021-11-13 DIAGNOSIS — R195 Other fecal abnormalities: Secondary | ICD-10-CM

## 2021-11-13 DIAGNOSIS — Z8261 Family history of arthritis: Secondary | ICD-10-CM

## 2021-11-13 DIAGNOSIS — Z825 Family history of asthma and other chronic lower respiratory diseases: Secondary | ICD-10-CM

## 2021-11-13 DIAGNOSIS — F17219 Nicotine dependence, cigarettes, with unspecified nicotine-induced disorders: Secondary | ICD-10-CM | POA: Diagnosis present

## 2021-11-13 DIAGNOSIS — R1031 Right lower quadrant pain: Principal | ICD-10-CM

## 2021-11-13 DIAGNOSIS — K219 Gastro-esophageal reflux disease without esophagitis: Secondary | ICD-10-CM | POA: Diagnosis present

## 2021-11-13 DIAGNOSIS — E876 Hypokalemia: Secondary | ICD-10-CM | POA: Diagnosis present

## 2021-11-13 DIAGNOSIS — Z1152 Encounter for screening for COVID-19: Secondary | ICD-10-CM

## 2021-11-13 DIAGNOSIS — Z885 Allergy status to narcotic agent status: Secondary | ICD-10-CM

## 2021-11-13 DIAGNOSIS — R3129 Other microscopic hematuria: Secondary | ICD-10-CM | POA: Diagnosis present

## 2021-11-13 DIAGNOSIS — F32A Depression, unspecified: Secondary | ICD-10-CM | POA: Diagnosis present

## 2021-11-13 LAB — LACTIC ACID, PLASMA
Lactic Acid, Venous: 1.2 mmol/L (ref 0.5–1.9)
Lactic Acid, Venous: 0.9 mmol/L (ref 0.5–1.9)

## 2021-11-13 LAB — COMPREHENSIVE METABOLIC PANEL
ALT: 11 U/L (ref 0–44)
AST: 12 U/L — ABNORMAL LOW (ref 15–41)
Albumin: 2.8 g/dL — ABNORMAL LOW (ref 3.5–5.0)
Alkaline Phosphatase: 83 U/L (ref 38–126)
Anion gap: 16 — ABNORMAL HIGH (ref 5–15)
BUN: 9 mg/dL (ref 6–20)
CO2: 17 mmol/L — ABNORMAL LOW (ref 22–32)
Calcium: 8.9 mg/dL (ref 8.9–10.3)
Chloride: 102 mmol/L (ref 98–111)
Creatinine, Ser: 0.76 mg/dL (ref 0.44–1.00)
GFR, Estimated: >60 mL/min (ref >60)
Glucose, Bld: 94 mg/dL (ref 70–99)
Potassium: 3.2 mmol/L — ABNORMAL LOW (ref 3.5–5.1)
Sodium: 135 mmol/L (ref 135–145)
Total Bilirubin: 0.8 mg/dL (ref 0.3–1.2)
Total Protein: 6.5 g/dL (ref 6.5–8.1)

## 2021-11-13 LAB — CBC
HCT: 33.6 % — ABNORMAL LOW (ref 36.0–46.0)
Hemoglobin: 11.8 g/dL — ABNORMAL LOW (ref 12.0–15.0)
MCH: 32.1 pg (ref 26.0–34.0)
MCHC: 35.1 g/dL (ref 30.0–36.0)
MCV: 91.3 fL (ref 80.0–100.0)
Platelets: 412 10*3/uL — ABNORMAL HIGH (ref 150–400)
RBC: 3.68 MIL/uL — ABNORMAL LOW (ref 3.87–5.11)
RDW: 12.5 % (ref 11.5–15.5)
WBC: 15.6 10*3/uL — ABNORMAL HIGH (ref 4.0–10.5)
nRBC: 0 % (ref 0.0–0.2)

## 2021-11-13 LAB — URINALYSIS, ROUTINE W REFLEX MICROSCOPIC
Glucose, UA: NEGATIVE mg/dL
Ketones, ur: >80 mg/dL — AB
Leukocytes,Ua: NEGATIVE
Nitrite: NEGATIVE
Protein, ur: 100 mg/dL — AB
Specific Gravity, Urine: >1.03 — ABNORMAL HIGH (ref 1.005–1.030)
pH: 5.5 (ref 5.0–8.0)

## 2021-11-13 LAB — RAPID URINE DRUG SCREEN, HOSP PERFORMED
Amphetamines: NOT DETECTED
Barbiturates: NOT DETECTED
Benzodiazepines: NOT DETECTED
Cocaine: NOT DETECTED
Opiates: NOT DETECTED
Tetrahydrocannabinol: POSITIVE — AB

## 2021-11-13 LAB — URINALYSIS, MICROSCOPIC (REFLEX)

## 2021-11-13 LAB — LIPASE, BLOOD: Lipase: 20 U/L (ref 11–51)

## 2021-11-13 LAB — PREGNANCY, URINE: Preg Test, Ur: NEGATIVE

## 2021-11-13 LAB — HEMOGLOBIN AND HEMATOCRIT, BLOOD
HCT: 33.1 % — ABNORMAL LOW (ref 36.0–46.0)
Hemoglobin: 11.1 g/dL — ABNORMAL LOW (ref 12.0–15.0)

## 2021-11-13 LAB — HIV ANTIBODY (ROUTINE TESTING W REFLEX): HIV Screen 4th Generation wRfx: NONREACTIVE

## 2021-11-13 LAB — PROTIME-INR
INR: 1.1 (ref 0.8–1.2)
Prothrombin Time: 14 seconds (ref 11.4–15.2)

## 2021-11-13 LAB — TYPE AND SCREEN
ABO/RH(D): O POS
Antibody Screen: NEGATIVE

## 2021-11-13 LAB — I-STAT BETA HCG BLOOD, ED (MC, WL, AP ONLY): I-stat hCG, quantitative: 150.9 m[IU]/mL — ABNORMAL HIGH (ref <5)

## 2021-11-13 LAB — POC OCCULT BLOOD, ED: Fecal Occult Bld: POSITIVE — AB

## 2021-11-13 LAB — RESP PANEL BY RT-PCR (FLU A&B, COVID) ARPGX2
Influenza A by PCR: NEGATIVE
Influenza B by PCR: NEGATIVE
SARS Coronavirus 2 by RT PCR: NEGATIVE

## 2021-11-13 MED ORDER — DIPHENHYDRAMINE HCL 25 MG PO CAPS
50.0000 mg | ORAL_CAPSULE | Freq: Every day | ORAL | Status: DC
  Administered 2021-11-14 (×2): 50 mg via ORAL
  Filled 2021-11-13 (×2): qty 2

## 2021-11-13 MED ORDER — ONDANSETRON HCL 4 MG/2ML IJ SOLN
4.0000 mg | Freq: Once | INTRAMUSCULAR | Status: AC
  Administered 2021-11-13: 4 mg via INTRAVENOUS
  Filled 2021-11-13: qty 2

## 2021-11-13 MED ORDER — HYDROCODONE-ACETAMINOPHEN 5-325 MG PO TABS
1.0000 | ORAL_TABLET | Freq: Four times a day (QID) | ORAL | Status: DC | PRN
  Administered 2021-11-13 – 2021-11-15 (×5): 1 via ORAL
  Filled 2021-11-13 (×6): qty 1

## 2021-11-13 MED ORDER — PANTOPRAZOLE 80MG IVPB - SIMPLE MED
80.0000 mg | Freq: Once | INTRAVENOUS | Status: AC
  Administered 2021-11-13: 80 mg via INTRAVENOUS
  Filled 2021-11-13: qty 100

## 2021-11-13 MED ORDER — PANTOPRAZOLE INFUSION (NEW) - SIMPLE MED
8.0000 mg/h | INTRAVENOUS | Status: DC
  Filled 2021-11-13: qty 100

## 2021-11-13 MED ORDER — MONTELUKAST SODIUM 10 MG PO TABS
10.0000 mg | ORAL_TABLET | Freq: Every day | ORAL | Status: DC
  Administered 2021-11-14: 10 mg via ORAL
  Filled 2021-11-13: qty 1

## 2021-11-13 MED ORDER — ONDANSETRON HCL 4 MG/2ML IJ SOLN
4.0000 mg | Freq: Four times a day (QID) | INTRAMUSCULAR | Status: DC | PRN
  Administered 2021-11-14 – 2021-11-15 (×3): 4 mg via INTRAVENOUS
  Filled 2021-11-13 (×3): qty 2

## 2021-11-13 MED ORDER — PANTOPRAZOLE SODIUM 40 MG IV SOLR
40.0000 mg | Freq: Two times a day (BID) | INTRAVENOUS | Status: DC
  Administered 2021-11-14 (×3): 40 mg via INTRAVENOUS
  Filled 2021-11-13 (×4): qty 10

## 2021-11-13 MED ORDER — METOCLOPRAMIDE HCL 5 MG/ML IJ SOLN
10.0000 mg | Freq: Once | INTRAMUSCULAR | Status: AC
  Administered 2021-11-13: 10 mg via INTRAVENOUS
  Filled 2021-11-13: qty 2

## 2021-11-13 MED ORDER — IPRATROPIUM-ALBUTEROL 0.5-2.5 (3) MG/3ML IN SOLN
3.0000 mL | Freq: Once | RESPIRATORY_TRACT | Status: AC
  Administered 2021-11-13: 3 mL via RESPIRATORY_TRACT
  Filled 2021-11-13: qty 3

## 2021-11-13 MED ORDER — SODIUM CHLORIDE 0.9 % IV BOLUS: 500.0000 mL | Freq: Once | INTRAVENOUS | Status: DC

## 2021-11-13 MED ORDER — ATORVASTATIN CALCIUM 10 MG PO TABS
20.0000 mg | ORAL_TABLET | Freq: Every day | ORAL | Status: DC
  Administered 2021-11-13 – 2021-11-15 (×3): 20 mg via ORAL
  Filled 2021-11-13 (×3): qty 2

## 2021-11-13 MED ORDER — POTASSIUM CHLORIDE CRYS ER 20 MEQ PO TBCR
40.0000 meq | EXTENDED_RELEASE_TABLET | Freq: Once | ORAL | Status: AC
  Administered 2021-11-13: 40 meq via ORAL
  Filled 2021-11-13: qty 2

## 2021-11-13 MED ORDER — NICOTINE 14 MG/24HR TD PT24
14.0000 mg | MEDICATED_PATCH | Freq: Every day | TRANSDERMAL | Status: DC
  Administered 2021-11-13 – 2021-11-14 (×2): 14 mg via TRANSDERMAL
  Filled 2021-11-13 (×3): qty 1

## 2021-11-13 MED ORDER — SODIUM CHLORIDE 0.9 % IV BOLUS: 1000.0000 mL | Freq: Once | INTRAVENOUS | Status: DC

## 2021-11-13 MED ORDER — ACETAMINOPHEN 650 MG RE SUPP: 650.0000 mg | Freq: Four times a day (QID) | RECTAL | Status: DC | PRN

## 2021-11-13 MED ORDER — ONDANSETRON HCL 4 MG PO TABS: 4.0000 mg | ORAL_TABLET | Freq: Four times a day (QID) | ORAL | Status: DC | PRN

## 2021-11-13 MED ORDER — MOMETASONE FURO-FORMOTEROL FUM 100-5 MCG/ACT IN AERO
2.0000 | INHALATION_SPRAY | Freq: Two times a day (BID) | RESPIRATORY_TRACT | Status: DC
  Administered 2021-11-13 – 2021-11-15 (×3): 2 via RESPIRATORY_TRACT
  Filled 2021-11-13: qty 8.8

## 2021-11-13 MED ORDER — ACETAMINOPHEN 325 MG PO TABS
650.0000 mg | ORAL_TABLET | Freq: Four times a day (QID) | ORAL | Status: DC | PRN
  Administered 2021-11-14: 650 mg via ORAL
  Filled 2021-11-13: qty 2

## 2021-11-13 MED ORDER — IPRATROPIUM-ALBUTEROL 0.5-2.5 (3) MG/3ML IN SOLN
3.0000 mL | RESPIRATORY_TRACT | Status: DC | PRN
  Administered 2021-11-13 – 2021-11-14 (×2): 3 mL via RESPIRATORY_TRACT
  Filled 2021-11-13 (×3): qty 3

## 2021-11-13 MED ORDER — LACTATED RINGERS IV BOLUS
1000.0000 mL | Freq: Once | INTRAVENOUS | Status: AC
  Administered 2021-11-13: 1000 mL via INTRAVENOUS

## 2021-11-13 MED ORDER — SODIUM CHLORIDE 0.9 % IV SOLN: INTRAVENOUS | Status: DC

## 2021-11-13 MED ORDER — GUAIFENESIN ER 600 MG PO TB12
600.0000 mg | ORAL_TABLET | Freq: Every day | ORAL | Status: DC
  Administered 2021-11-14 – 2021-11-15 (×2): 600 mg via ORAL
  Filled 2021-11-13 (×2): qty 1

## 2021-11-13 MED ORDER — FENTANYL CITRATE PF 50 MCG/ML IJ SOSY
50.0000 ug | PREFILLED_SYRINGE | Freq: Once | INTRAMUSCULAR | Status: AC
  Administered 2021-11-13: 50 ug via INTRAVENOUS
  Filled 2021-11-13: qty 1

## 2021-11-13 MED ORDER — IOHEXOL 350 MG/ML SOLN
80.0000 mL | Freq: Once | INTRAVENOUS | Status: AC | PRN
  Administered 2021-11-13: 80 mL via INTRAVENOUS

## 2021-11-13 MED ORDER — MORPHINE SULFATE (PF) 2 MG/ML IV SOLN
2.0000 mg | INTRAVENOUS | Status: AC | PRN
  Administered 2021-11-14 (×3): 2 mg via INTRAVENOUS
  Filled 2021-11-13 (×3): qty 1

## 2021-11-13 MED ORDER — SODIUM CHLORIDE 0.9% FLUSH
3.0000 mL | Freq: Two times a day (BID) | INTRAVENOUS | Status: DC
  Administered 2021-11-14 – 2021-11-15 (×3): 3 mL via INTRAVENOUS

## 2021-11-13 MED ORDER — DIPHENHYDRAMINE HCL 50 MG/ML IJ SOLN
12.5000 mg | Freq: Once | INTRAMUSCULAR | Status: AC
  Administered 2021-11-13: 12.5 mg via INTRAVENOUS
  Filled 2021-11-13: qty 1

## 2021-11-13 MED ORDER — SODIUM CHLORIDE 0.9 % IV SOLN
1.0000 g | Freq: Once | INTRAVENOUS | Status: AC
  Administered 2021-11-13: 1 g via INTRAVENOUS
  Filled 2021-11-13: qty 10

## 2021-11-13 MED ORDER — ALBUTEROL SULFATE (2.5 MG/3ML) 0.083% IN NEBU: 2.5000 mg | INHALATION_SOLUTION | Freq: Four times a day (QID) | RESPIRATORY_TRACT | Status: DC | PRN

## 2021-11-13 MED ORDER — LISINOPRIL 20 MG PO TABS
20.0000 mg | ORAL_TABLET | Freq: Every day | ORAL | Status: DC
  Administered 2021-11-14 – 2021-11-15 (×2): 20 mg via ORAL
  Filled 2021-11-13 (×2): qty 1

## 2021-11-13 NOTE — Consult Note (Addendum)
[facility] Consult: 2:48 PM 11/13/2021  LOS: 0 days    Referring Provider: Dr Armandina Gemma  Primary Care Physician:  Soyla Dryer, PA-C in [place] Primary Gastroenterologist:  Dr. Abbey Chatters.       Reason for Consultation: Nonbloody nausea, vomiting, right flank pain, diffuse chest pain. FOBT positive.     HPI: Pamela Livingston is a 56 y.o. female.  PMH asthma.  COPD.  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  Leg pain with multiple doppler studies negative for DVT.  Endometriosis.  Polysubstance abuse.  GERD.  Stomach ulcer (decades ago, she is not sure how this was diagnosed as she does not recall a remote endoscopy at that time).  Depression.  Obesity, BMI 33 currently.  Previous surgeries include abdominal hysterectomy, C-section, cholecystectomy, tubal ligation, hysterectomy, cervical cone biopsy.  She denies previous appendectomy or issues w kidney stones.    07/2020 EGD.  For evaluation dysphagia, heartburn despite high dose PPI.  Dr. Abbey Chatters encountered small [place].  He baloon dilated a mild, nonobstructing Schatzki ring.  Erythema in the gastric body, biopsied (path: Benign gastric mucosa with mild reactive changes.  No H. pylori, intestinal metaplasia or malignancy).  Normal examined duodenum to D2.  Esophagus also biopsied (Benign squamous mucosa, No increased intraepithelial eosinophils). 07/2020 colonoscopy by Dr. Abbey Chatters.  For evaluation of positive fecal immunochemical test (FIT).   Found nonbleeding internal hemorrhoids.  Removed 4 mm polyp (path: TA) from the descending colon.  GI issues include chronic, often daily rectal bleeding of minor amounts of blood associated with BMs.  Occasional epigastric discomfort and what she sees as visual bulging in the epigastric region, this resolves after she vomits.  Has  been on meloxicam once daily for about a year.  Compliant with omeprazole 40 mg po bid.  Intermittent, unpredictable solid dysphagia, not frequent.      In the middle to later part of last week, about 4 to 5 days ago, developed acute onset nonbloody nausea and vomiting and pain throughout her chest along with right flank pain.  Stools, which are normally soft, turned a runny consistency.  She was taking bismuth and the stools became black in color as well.  Developed sweats, chills.  Was able to tolerate some Gatorade and water.  Stools were concentrated yellow-colored but no dysuria or frequency.  Patient denies any change in her stable mild DOE and mostly nonproductive cough. Due to symptoms not improving, came to ED for evaluation.  Normotensive.  Tachycardic in the low 100s as high as 120.  Room air sats mid to upper 90%.  No fever. K 3.2.  normal GFR.   LFTs not elevated.  Albumin low at 2.8.   Hgb 11.8.  MCV 91 WBCs 15.6.  platelets 412. U/A: 21-50 RBCs, rare bacteria, 0-5 squames, 0-5 WBCs.  Greater than 80 ketones. CTAP w contrast:  hepatic steatosis.  GB surgically absent.  Nonspecific right ureter calcifications, possibly related to prior embolization procedure.  Urinary bladder fluid-filled but no wall thickening.  Uncomplicated colon diverticulosis.  Status post appendectomy. CT  angio of chest: No PE.  New small R pleural effusions.  Coronary and aortic atherosclerosis.    Fm Hx negative for colorectal dz.   Social history: Previous crack cocaine use for 10 years, has not smoked crack for over 20 years.  Smokes marijuana about once daily.  Smokes half pack cigarettes daily.  No EtOH for many years.   Past Medical History:  Diagnosis Date   Asthma    Chronic pelvic pain in female    COPD (chronic obstructive pulmonary disease) ([place])    Endometriosis    Polysubstance abuse ([place])    marijuana, cocaine, benzos   Stomach ulcer    Suicide attempt ([place]) 2008   by phenergan overdose    Syncope     Past Surgical History:  Procedure Laterality Date   ABDOMINAL HYSTERECTOMY     BALLOON DILATION N/A 07/26/2020   Procedure: BALLOON DILATION;  Surgeon: Eloise Harman, DO;  [facility];  Service: Endoscopy;  Laterality: N/A;   BIOPSY  07/26/2020   Procedure: BIOPSY;  Surgeon: Eloise Harman, DO;  [facility];  Service: Endoscopy;;   CERVICAL CONE BIOPSY     cervical cancer   CESAREAN SECTION  1993   CHOLECYSTECTOMY     COLONOSCOPY WITH PROPOFOL N/A 07/26/2020   Procedure: COLONOSCOPY WITH PROPOFOL;  Surgeon: Eloise Harman, DO;  [facility]: [facility];  Service: Endoscopy;  Laterality: N/A;  12:00pm   ESOPHAGOGASTRODUODENOSCOPY (EGD) WITH PROPOFOL N/A 07/26/2020   Procedure: ESOPHAGOGASTRODUODENOSCOPY (EGD) WITH PROPOFOL;  Surgeon: Eloise Harman, DO;  [facility];  Service: Endoscopy;  Laterality: N/A;   RADICAL HYSTERECTOMY WITH TRANSPOSITION OF OVARIES     TOOTH EXTRACTION     TUBAL LIGATION  1993    Prior to Admission medications   Medication Sig Start Date End Date Taking? Authorizing Provider  acetaminophen (TYLENOL) 500 MG tablet Take 1,500 mg by mouth every 6 (six) hours as needed for moderate pain.   Yes [provider]  albuterol (PROVENTIL) (2.5 MG/3ML) 0.083% nebulizer solution INHALE 1 VIAL VIA NEBULIZER EVERY 6 HOURS AS NEEDED FOR WHEEZING OR SHORTNESS OF BREATH Patient taking differently: Take 2.5 mg by nebulization every 6 (six) hours as needed for wheezing or shortness of breath. 10/23/21  Yes Soyla Dryer, PA-C  albuterol (VENTOLIN HFA) 108 (90 Base) MCG/ACT inhaler INHALE 2 PUFFS BY MOUTH EVERY 6 HOURS AS NEEDED FOR COUGHING, WHEEZING, OR SHORTNESS OF BREATH Patient taking differently: Inhale 2 puffs into the lungs every 6 (six) hours as needed for shortness of breath or wheezing. 10/23/21  Yes Soyla Dryer, PA-C  atorvastatin (LIPITOR) 20 MG tablet TAKE 1 Tablet BY MOUTH ONCE DAILY Patient taking  differently: Take 20 mg by mouth daily. 10/23/21  Yes Soyla Dryer, PA-C  diphenhydrAMINE (BENADRYL) 25 MG tablet Take 50 mg by mouth at bedtime.   Yes [provider]  fluticasone-salmeterol (WIXELA INHUB) 100-50 MCG/ACT AEPB Inhale 1 puff into the lungs 2 (two) times daily.   Yes [provider]  guaiFENesin (MUCINEX) 600 MG 12 hr tablet Take 600 mg by mouth daily.   Yes [provider]  lisinopril (ZESTRIL) 20 MG tablet TAKE 1 Tablet BY MOUTH ONCE DAILY Patient taking differently: Take 20 mg by mouth daily. 10/23/21  Yes Soyla Dryer, PA-C  loratadine-pseudoephedrine (CLARITIN-D 24-HOUR) 10-240 MG 24 hr tablet Take 1 tablet by mouth in the morning.    Yes [provider]  meloxicam (MOBIC) 7.5 MG tablet Take 1  tablet (7.5 mg total) by mouth daily. 09/12/21  Yes Carole Civil, MD  montelukast (SINGULAIR) 10 MG tablet TAKE 1 Tablet BY MOUTH ONCE DAILY AS NEEDED Patient taking differently: Take 10 mg by mouth daily as needed (asthma). 10/23/21  Yes Soyla Dryer, PA-C  nitroGLYCERIN (NITROSTAT) 0.4 MG SL tablet Place 1 tablet (0.4 mg total) under the tongue every 5 (five) minutes as needed for chest pain. 10/02/18  Yes Soyla Dryer, PA-C  omeprazole (PRILOSEC) 40 MG capsule TAKE 1 Capsule  BY MOUTH TWICE DAILY Patient taking differently: Take 40 mg by mouth 2 (two) times daily. 10/23/21  Yes Soyla Dryer, PA-C  cyclobenzaprine (FLEXERIL) 10 MG tablet Take 1 tablet (10 mg total) by mouth 3 (three) times daily as needed for muscle spasms. Patient not taking: Reported on 09/13/2021 07/18/21   Soyla Dryer, PA-C  fluticasone-salmeterol (ADVAIR) 100-50 MCG/ACT AEPB INHALE 1 PUFF BY MOUTH TWICE DAILY (EVERY 12 HOURS). RINSE MOUTH AFTER EACH USE. (EVERY MORNING AND EVERY NIGHT AT BEDTIME) Patient not taking: Reported on 11/13/2021 10/23/21   Soyla Dryer, PA-C  meloxicam [facility]) 7.5 MG tablet Take 1 tablet by mouth once daily Patient not taking:  Reported on 11/13/2021 09/12/21   Carole Civil, MD    Scheduled Meds:  Infusions:  sodium chloride     And   sodium chloride     cefTRIAXone (ROCEPHIN)  IV 1 g (11/13/21 1430)   pantoprazole     pantoprazole     PRN Meds:    Allergies as of 11/13/2021 - Review Complete 11/13/2021  Allergen Reaction Noted   Asa [aspirin] Anaphylaxis, Hives, and Swelling 08/16/2011   Codeine Nausea Only 09/20/2020    Family History  Problem Relation Age of Onset   Heart disease Mother    Hypertension Mother    Diabetes Mother    Rheum arthritis Mother    Heart disease Father    Hypertension Father    Heart attack Sister    Stroke Sister    Dementia Sister    Heart attack Maternal Uncle    Heart attack Maternal Uncle    COPD Paternal Grandfather    Colon cancer Neg Hx     Social History   Socioeconomic History   Marital status: Divorced    Spouse name: Not on file   Number of children: Not on file   Years of education: Not on file   Highest education level: Not on file  Occupational History   Not on file  Tobacco Use   Smoking status: Some Days    Packs/day: 0.50    Years: 27.00    Total pack years: 13.50    Types: Cigarettes   Smokeless tobacco: Never   Tobacco comments:    1 cig every other day  Vaping Use   Vaping Use: Never used  Substance and Sexual Activity   Alcohol use: Not Currently   Drug use: Yes    Frequency: 7.0 times per week    Types: Marijuana   Sexual activity: Yes    Birth control/protection: Surgical  Other Topics Concern   Not on file  Social History Narrative   Not on file   Social Determinants of Health   Financial Resource Strain: Not on file  Food Insecurity: Not on file  Transportation Needs: Not on file  Physical Activity: Not on file  Stress: Not on file  Social Connections: Not on file  Intimate Partner Violence: Not on file    REVIEW OF SYSTEMS: Constitutional:  Feels tired and weak. ENT:  No nose bleeds Pulm:  Chronic, predominantly nonproductive, cough.  Stable dyspnea with moderate to intense exertion. CV:  No palpitations, no LE edema.  Her chest pain is not anginal in character. GU: Urine is darker in color.  No dysuria.  No obvious hematuria, no frequency GI: See HPI. Heme: Denies unusual or excessive bleeding or bruising. Transfusions: None. Neuro:  No headaches, no peripheral tingling or numbness Derm:  No itching, no rash or sores.  Endocrine:  No polyuria or dysuria Immunization: Reviewed. Travel: Not queried.   PHYSICAL EXAM: Vital signs in last 24 hours: Vitals:   11/13/21 1400 11/13/21 1438  BP: 136/71   Pulse: (!) 106   Resp: (!) 31   Temp:  97.8 F (36.6 C)  SpO2: 93%    Wt Readings from Last 3 Encounters:  11/13/21 74.8 kg  11/09/21 74.8 kg  09/13/21 76.2 kg    General: Patient looks mildly uncomfortable, mildly ill.  Not toxic-appearing.  She is alert resting on the stretcher. Head: No facial asymmetry or swelling.  No signs of head trauma. Eyes: No scleral icterus.  No conjunctival pallor. Ears: Not hard of hearing Nose: No congestion or discharge Mouth: Fair dentition.  Tongue midline.  Mucosa moist, pink, clear. Neck: No JVD, no masses, no thyromegaly. Lungs: Clear bilaterally.  Hoarse, husky vocal quality.  Occasional dry cough.  No dyspnea. Heart: RRR.  No MRG.  S1, S2 present.  Tenderness to palpation across the chest bilaterally. Abdomen: Soft.  Minimal tenderness over on right upper quadrant.  No HSM, masses, bruits, hernias.  Bowel sounds active..   Rectal: Deferred.  Stool tested by ED staff was FOBT positive, dark in color. Musc/Skeltl: No joint redness, swelling or gross deformity. Extremities: No CCE. Neurologic: Alert.  Able to provide good history.  Moves all 4 limbs without difficulty or gross deficit.  Formal strength testing not performed.  No tremors. Skin: No obvious rash, sores, suspicious lesions, telangiectasia Nodes: No cervical  adenopathy Psych: Calm, cooperative, pleasant.  Fluid speech  Intake/Output from previous day: No intake/output data recorded. Intake/Output this shift: No intake/output data recorded.  LAB RESULTS: Recent Labs    11/13/21 1043  WBC 15.6*  HGB 11.8*  HCT 33.6*  PLT 412*   BMET Lab Results  Component Value Date   NA 135 11/13/2021   NA 138 11/09/2021   NA 137 06/15/2021   K 3.2 (L) 11/13/2021   K 2.9 (L) 11/09/2021   K 4.8 06/15/2021   CL 102 11/13/2021   CL 106 11/09/2021   CL 106 06/15/2021   CO2 17 (L) 11/13/2021   CO2 20 (L) 11/09/2021   CO2 25 06/15/2021   GLUCOSE 94 11/13/2021   GLUCOSE 118 (H) 11/09/2021   GLUCOSE 94 06/15/2021   BUN 9 11/13/2021   BUN 9 11/09/2021   BUN 20 06/15/2021   CREATININE 0.76 11/13/2021   CREATININE 0.76 11/09/2021   CREATININE 0.87 06/15/2021   CALCIUM 8.9 11/13/2021   CALCIUM 9.3 11/09/2021   CALCIUM 9.4 06/15/2021   LFT Recent Labs    11/13/21 1043  PROT 6.5  ALBUMIN 2.8*  AST 12*  ALT 11  ALKPHOS 83  BILITOT 0.8   PT/INR Lab Results  Component Value Date   INR 1.0 12/08/2006   Hepatitis Panel No results for input(s): "HEPBSAG", "HCVAB", "HEPAIGM", "HEPBIGM" in the last 72 hours. C-Diff No components found for: "CDIFF" Lipase     Component Value Date/Time  LIPASE 20 11/13/2021 1043    Drugs of Abuse     Component Value Date/Time   LABOPIA NONE DETECTED 03/02/2015 0225   COCAINSCRNUR NONE DETECTED 03/02/2015 0225   LABBENZ NONE DETECTED 03/02/2015 0225   AMPHETMU NONE DETECTED 03/02/2015 0225   THCU POSITIVE (A) 03/02/2015 0225   LABBARB NONE DETECTED 03/02/2015 0225     RADIOLOGY STUDIES: CT ABDOMEN PELVIS W CONTRAST  Result Date: 11/13/2021 CLINICAL DATA:  Abdominal pain EXAM: CT ABDOMEN AND PELVIS WITH CONTRAST TECHNIQUE: Multidetector CT imaging of the abdomen and pelvis was performed using the standard protocol following bolus administration of intravenous contrast. RADIATION DOSE REDUCTION:  This exam was performed according to the departmental dose-optimization program which includes automated exposure control, adjustment of the mA and/or kV according to patient size and/or use of iterative reconstruction technique. CONTRAST:  49m OMNIPAQUE IOHEXOL 350 MG/ML SOLN COMPARISON:  None Available. FINDINGS: Lower chest: Small right pleural effusion with associated passive atelectasis. Hepatobiliary: Hepatic steatosis. Liver has a contour. No focal liver lesions are seen. Portal veins are contrast opacified. Gallbladder is surgically absent. No evidence of intra or extrahepatic biliary duct dilatation. Pancreas: No evidence of Peri pancreatic fat stranding. No evidence of pancreatic ductal dilatation. Spleen: Spleen is normal in size. No evidence of focal lesion or perisplenic fluid. Adrenals/Urinary Tract: Bilateral adrenal glands are normal in appearance. Evidence of hydronephrosis or nephrolithiasis. No focal renal lesions are visualized. There are nonspecific calcifications along the course of the right ureter, possibly related to prior embolization procedure. The urinary bladder is fluid-filled without evidence of focal wall thickening. Stomach/Bowel: The stomach, small bowel, large bowel are normal in caliber without evidence of bowel obstruction. There is diverticulosis without definitive evidence of diverticulitis. Status post appendectomy. Vascular/Lymphatic: No significant vascular findings are present. No enlarged abdominal or pelvic lymph nodes. Reproductive: Uterus and bilateral adnexa are unremarkable. Other: No abdominal wall hernia or abnormality. No abdominopelvic ascites. Musculoskeletal: No acute or significant osseous findings. IMPRESSION: No finding below the diaphragm to explain abdominal pain. See separately dictated CT of the chest. Electronically Signed   By: HMarin RobertsM.D.   On: 11/13/2021 13:47   CT Angio Chest PE W and/or Wo Contrast  Result Date: 11/13/2021 CLINICAL  DATA:  Right sided pain, emesis EXAM: CT ANGIOGRAPHY CHEST WITH CONTRAST TECHNIQUE: Multidetector CT imaging of the chest was performed using the standard protocol during bolus administration of intravenous contrast. Multiplanar CT image reconstructions and MIPs were obtained to evaluate the vascular anatomy. RADIATION DOSE REDUCTION: This exam was performed according to the departmental dose-optimization program which includes automated exposure control, adjustment of the mA and/or kV according to patient size and/or use of iterative reconstruction technique. CONTRAST:  861mOMNIPAQUE IOHEXOL 350 MG/ML SOLN COMPARISON:  12/12/2019 FINDINGS: Cardiovascular: Heart size normal. No pericardial effusion. The RV is nondilated. Scattered coronary calcifications. Adequate contrast opacification of the thoracic aorta with no evidence of dissection, aneurysm, or stenosis. There is classic 3-vessel brachiocephalic arch anatomy without proximal stenosis. Scattered partially calcified plaque in the descending thoracic aorta. Mediastinum/Nodes: No mass or adenopathy. Lungs/Pleura: New small right pleural effusion. No pneumothorax. Dependent atelectasis at the right lung base. Platelike linear scarring or subsegmental atelectasis in the left lower lobe as before. Upper Abdomen: No acute findings. Musculoskeletal: Mild spondylitic changes in the lower cervical, mid and lower thoracic spine. No acute findings. Review of the MIP images confirms the above findings. IMPRESSION: 1. Negative for acute PE or thoracic aortic dissection. 2. New small right pleural  effusion. 3. Coronary and  aortic Atherosclerosis (ICD10-I70.0). Electronically Signed   By: Lucrezia Europe M.D.   On: 11/13/2021 13:31     IMPRESSION:   Black stools.  These tested heme positive today.  She has been taking bismuth at home which will cause black stools but not FOBT positive stools.   Nonbloody nausea, vomiting, generalized non-anginal chest pain and TTP.  Hx  GERD and possible remote stomach ulcer.  At EGD in July 2022 Dr. Abbey Chatters dilated a small Schatzki's ring and noted gastric erythema, biopsies showed mild reactive changes but no H. pylori etc.  Esophageal pathology also benign.  Patient compliant with twice daily high-dose omeprazole.  Also takes meloxicam daily.  Frequent minor rectal bleeding.  Hx positive fit test.  07/2020 colonoscopy with internal hemorrhoids, not bleeding at that time.  He removed a 4 mm, tubular adenomatous polyp from the descending colon.  Due for repeat colonoscopy in 07/2025.    R flank pain.  UA shows significant RBCs but rare bacteria and WBCs.  The CT demonstrates calcifications but no obstruction in the right ureter. Urine clx pndg.      PLAN:       EGD?, would be tmrw.  Change to Protonix 40 mg IV bid (to replace Protonix gtt).  Anti-nauseal meds, pain meds as needed.  Azucena Freed  11/13/2021, 2:48 PM Phone [phone]    [place] Attending   I have taken an interval history, reviewed the chart and examined the patient. I agree with the Advanced Practitioner's note, impression and recommendations.    Cause of her problems not clear. No good evidence for nephrolithiasis but I wonder. I suspect the black stools were from Pepto Bismol and the heme + stool insignificant and from chronic rectal bleeding, though not certain.  Given diagnostic dilemma will do EGD  The risks and benefits as well as alternatives of endoscopic procedure(s) have been discussed and reviewed. All questions answered. The patient agrees to proceed.  Gatha Mayer, MD, [facility] See Shea Evans on call - gastroenterology for best contact person 11/13/2021 5:00 PM

## 2021-11-13 NOTE — ED Provider Notes (Signed)
[facility] EMERGENCY DEPARTMENT Provider Note   CSN: [id number] Arrival date & time: 11/13/21  7425     History  Chief Complaint  Patient presents with   Abdominal Pain    Pamela Livingston is a 56 y.o. female.   Abdominal Pain Associated symptoms: diarrhea, nausea and vomiting      56 year old female with medical history significant for COPD, endometriosis, syncope, polysubstance abuse, PUD, GERD, obesity who presents to the emergency department with abdominal pain.  The patient initially presented to the emergency department on 11/09/2021 and was diagnosed with COPD exacerbation and subsequently discharged.  She states that she has had nausea, vomiting, diarrhea over the past couple of days.  She reports right upper quadrant and right lower quadrant abdominal discomfort.  She additionally endorses shortness of breath and wheezing which is at baseline for her COPD.  She denies any hematemesis.  She states that she has been having black diarrhea for the past few days.  She had been taking Pepto-Bismol earlier in the course of her illness.  She denies any dysuria or increased urinary frequency.  She denies any rectal pain.  Home Medications Prior to Admission medications   Medication Sig Start Date End Date Taking? Authorizing Provider  acetaminophen (TYLENOL) 500 MG tablet Take 1,500 mg by mouth every 6 (six) hours as needed for moderate pain.   Yes [provider]  albuterol (PROVENTIL) (2.5 MG/3ML) 0.083% nebulizer solution INHALE 1 VIAL VIA NEBULIZER EVERY 6 HOURS AS NEEDED FOR WHEEZING OR SHORTNESS OF BREATH Patient taking differently: Take 2.5 mg by nebulization every 6 (six) hours as needed for wheezing or shortness of breath. 10/23/21  Yes Soyla Dryer, PA-C  albuterol (VENTOLIN HFA) 108 (90 Base) MCG/ACT inhaler INHALE 2 PUFFS BY MOUTH EVERY 6 HOURS AS NEEDED FOR COUGHING, WHEEZING, OR SHORTNESS OF BREATH Patient taking differently: Inhale 2 puffs into  the lungs every 6 (six) hours as needed for shortness of breath or wheezing. 10/23/21  Yes Soyla Dryer, PA-C  atorvastatin (LIPITOR) 20 MG tablet TAKE 1 Tablet BY MOUTH ONCE DAILY Patient taking differently: Take 20 mg by mouth daily. 10/23/21  Yes Soyla Dryer, PA-C  diphenhydrAMINE (BENADRYL) 25 MG tablet Take 50 mg by mouth at bedtime.   Yes [provider]  fluticasone-salmeterol (WIXELA INHUB) 100-50 MCG/ACT AEPB Inhale 1 puff into the lungs 2 (two) times daily.   Yes [provider]  guaiFENesin (MUCINEX) 600 MG 12 hr tablet Take 600 mg by mouth daily.   Yes [provider]  lisinopril (ZESTRIL) 20 MG tablet TAKE 1 Tablet BY MOUTH ONCE DAILY Patient taking differently: Take 20 mg by mouth daily. 10/23/21  Yes Soyla Dryer, PA-C  loratadine-pseudoephedrine (CLARITIN-D 24-HOUR) 10-240 MG 24 hr tablet Take 1 tablet by mouth in the morning.    Yes [provider]  meloxicam (MOBIC) 7.5 MG tablet Take 1 tablet (7.5 mg total) by mouth daily. 09/12/21  Yes Carole Civil, MD  montelukast (SINGULAIR) 10 MG tablet TAKE 1 Tablet BY MOUTH ONCE DAILY AS NEEDED Patient taking differently: Take 10 mg by mouth daily as needed (asthma). 10/23/21  Yes Soyla Dryer, PA-C  nitroGLYCERIN (NITROSTAT) 0.4 MG SL tablet Place 1 tablet (0.4 mg total) under the tongue every 5 (five) minutes as needed for chest pain. 10/02/18  Yes Soyla Dryer, PA-C  omeprazole (PRILOSEC) 40 MG capsule TAKE 1 Capsule  BY MOUTH TWICE DAILY Patient taking differently: Take 40 mg by mouth 2 (two) times daily. 10/23/21  Yes Soyla Dryer, PA-C  cyclobenzaprine (FLEXERIL) 10 MG tablet Take 1 tablet (10 mg total) by mouth 3 (three) times daily as needed for muscle spasms. Patient not taking: Reported on 09/13/2021 07/18/21   Soyla Dryer, PA-C      Allergies    Asa [aspirin] and Codeine    Review of Systems   Review of Systems  Gastrointestinal:  Positive for abdominal pain,  diarrhea, nausea and vomiting.  All other systems reviewed and are negative.   Physical Exam Updated Vital Signs BP 120/76   Pulse (!) 104   Temp 97.8 F (36.6 C) (Oral)   Resp (!) 25   Ht '4\' 11"'$  (1.499 m)   Wt 74.8 kg   SpO2 91%   BMI 33.33 kg/m  Physical Exam Vitals and nursing note reviewed. Exam conducted with a chaperone present.  Constitutional:      General: She is not in acute distress.    Appearance: She is well-developed.  HENT:     Head: Normocephalic and atraumatic.  Eyes:     Conjunctiva/sclera: Conjunctivae normal.  Cardiovascular:     Rate and Rhythm: Regular rhythm. Tachycardia present.  Pulmonary:     Effort: Pulmonary effort is normal. No respiratory distress.     Breath sounds: Wheezing present.  Abdominal:     Palpations: Abdomen is soft.     Tenderness: There is abdominal tenderness in the right upper quadrant and right lower quadrant. There is no guarding or rebound.  Genitourinary:    Rectum: Guaiac result positive. External hemorrhoid and internal hemorrhoid present. No tenderness.     Comments: Internal and external hemorrhoids present, nonthrombosed and nonbleeding Musculoskeletal:        General: No swelling.     Cervical back: Neck supple.  Skin:    General: Skin is warm and dry.     Capillary Refill: Capillary refill takes less than 2 seconds.  Neurological:     Mental Status: She is alert.  Psychiatric:        Mood and Affect: Mood normal.     ED Results / Procedures / Treatments   Labs (all labs ordered are listed, but only abnormal results are displayed) Labs Reviewed  COMPREHENSIVE METABOLIC PANEL - Abnormal; Notable for the following components:      Result Value   Potassium 3.2 (*)    CO2 17 (*)    Albumin 2.8 (*)    AST 12 (*)    Anion gap 16 (*)    All other components within normal limits  CBC - Abnormal; Notable for the following components:   WBC 15.6 (*)    RBC 3.68 (*)    Hemoglobin 11.8 (*)    HCT 33.6 (*)     Platelets 412 (*)    All other components within normal limits  URINALYSIS, ROUTINE W REFLEX MICROSCOPIC - Abnormal; Notable for the following components:   Specific Gravity, Urine >1.030 (*)    Hgb urine dipstick LARGE (*)    Bilirubin Urine MODERATE (*)    Ketones, ur >80 (*)    Protein, ur 100 (*)    All other components within normal limits  URINALYSIS, MICROSCOPIC (REFLEX) - Abnormal; Notable for the following components:   Bacteria, UA RARE (*)    All other components within normal limits  RAPID URINE DRUG SCREEN, HOSP PERFORMED - Abnormal; Notable for the following components:   Tetrahydrocannabinol POSITIVE (*)    All other components within normal limits  HEMOGLOBIN AND HEMATOCRIT, BLOOD -  Abnormal; Notable for the following components:   Hemoglobin 11.1 (*)    HCT 33.1 (*)    All other components within normal limits  POC OCCULT BLOOD, ED - Abnormal; Notable for the following components:   Fecal Occult Bld POSITIVE (*)    All other components within normal limits  I-STAT BETA HCG BLOOD, ED ([facility], [facility], [facility] ONLY) - Abnormal; Notable for the following components:   I-stat hCG, quantitative 150.9 (*)    All other components within normal limits  RESP PANEL BY RT-PCR (FLU A&B, COVID) ARPGX2  URINE CULTURE  LIPASE, BLOOD  LACTIC ACID, PLASMA  LACTIC ACID, PLASMA  PROTIME-INR  HIV ANTIBODY (ROUTINE TESTING W REFLEX)  PREGNANCY, URINE  BRAIN NATRIURETIC PEPTIDE  HEMOGLOBIN AND HEMATOCRIT, BLOOD  CBC  BASIC METABOLIC PANEL  HCG, QUANTITATIVE, PREGNANCY  TYPE AND SCREEN    EKG EKG Interpretation  Date/Time:  Monday November 13 2021 08:53:19 EDT Ventricular Rate:  102 PR Interval:  131 QRS Duration: 92 QT Interval:  334 QTC Calculation: 435 R Axis:   64 Text Interpretation: Sinus tachycardia Abnormal R-wave progression, early transition Confirmed by Regan Lemming ([id number]) on 11/13/2021 8:56:21 AM  Radiology CT ABDOMEN PELVIS W CONTRAST  Result Date:  11/13/2021 CLINICAL DATA:  Abdominal pain EXAM: CT ABDOMEN AND PELVIS WITH CONTRAST TECHNIQUE: Multidetector CT imaging of the abdomen and pelvis was performed using the standard protocol following bolus administration of intravenous contrast. RADIATION DOSE REDUCTION: This exam was performed according to the departmental dose-optimization program which includes automated exposure control, adjustment of the mA and/or kV according to patient size and/or use of iterative reconstruction technique. CONTRAST:  80m OMNIPAQUE IOHEXOL 350 MG/ML SOLN COMPARISON:  None Available. FINDINGS: Lower chest: Small right pleural effusion with associated passive atelectasis. Hepatobiliary: Hepatic steatosis. Liver has a contour. No focal liver lesions are seen. Portal veins are contrast opacified. Gallbladder is surgically absent. No evidence of intra or extrahepatic biliary duct dilatation. Pancreas: No evidence of Peri pancreatic fat stranding. No evidence of pancreatic ductal dilatation. Spleen: Spleen is normal in size. No evidence of focal lesion or perisplenic fluid. Adrenals/Urinary Tract: Bilateral adrenal glands are normal in appearance. Evidence of hydronephrosis or nephrolithiasis. No focal renal lesions are visualized. There are nonspecific calcifications along the course of the right ureter, possibly related to prior embolization procedure. The urinary bladder is fluid-filled without evidence of focal wall thickening. Stomach/Bowel: The stomach, small bowel, large bowel are normal in caliber without evidence of bowel obstruction. There is diverticulosis without definitive evidence of diverticulitis. Status post appendectomy. Vascular/Lymphatic: No significant vascular findings are present. No enlarged abdominal or pelvic lymph nodes. Reproductive: Uterus and bilateral adnexa are unremarkable. Other: No abdominal wall hernia or abnormality. No abdominopelvic ascites. Musculoskeletal: No acute or significant osseous  findings. IMPRESSION: No finding below the diaphragm to explain abdominal pain. See separately dictated CT of the chest. Electronically Signed   By: HMarin RobertsM.D.   On: 11/13/2021 13:47   CT Angio Chest PE W and/or Wo Contrast  Result Date: 11/13/2021 CLINICAL DATA:  Right sided pain, emesis EXAM: CT ANGIOGRAPHY CHEST WITH CONTRAST TECHNIQUE: Multidetector CT imaging of the chest was performed using the standard protocol during bolus administration of intravenous contrast. Multiplanar CT image reconstructions and MIPs were obtained to evaluate the vascular anatomy. RADIATION DOSE REDUCTION: This exam was performed according to the departmental dose-optimization program which includes automated exposure control, adjustment of the mA and/or kV according to patient size and/or use of iterative reconstruction technique.  CONTRAST:  75m OMNIPAQUE IOHEXOL 350 MG/ML SOLN COMPARISON:  12/12/2019 FINDINGS: Cardiovascular: Heart size normal. No pericardial effusion. The RV is nondilated. Scattered coronary calcifications. Adequate contrast opacification of the thoracic aorta with no evidence of dissection, aneurysm, or stenosis. There is classic 3-vessel brachiocephalic arch anatomy without proximal stenosis. Scattered partially calcified plaque in the descending thoracic aorta. Mediastinum/Nodes: No mass or adenopathy. Lungs/Pleura: New small right pleural effusion. No pneumothorax. Dependent atelectasis at the right lung base. Platelike linear scarring or subsegmental atelectasis in the left lower lobe as before. Upper Abdomen: No acute findings. Musculoskeletal: Mild spondylitic changes in the lower cervical, mid and lower thoracic spine. No acute findings. Review of the MIP images confirms the above findings. IMPRESSION: 1. Negative for acute PE or thoracic aortic dissection. 2. New small right pleural effusion. 3. Coronary and  aortic Atherosclerosis (ICD10-I70.0). Electronically Signed   By: DLucrezia EuropeM.D.    On: 11/13/2021 13:31    Procedures .Critical Care  Performed by: LRegan Lemming MD Authorized by: LRegan Lemming MD   Critical care provider statement:    Critical care time (minutes):  30   Critical care was time spent personally by me on the following activities:  Development of treatment plan with patient or surrogate, discussions with consultants, evaluation of patient's response to treatment, examination of patient, ordering and review of laboratory studies, ordering and review of radiographic studies, ordering and performing treatments and interventions, pulse oximetry, re-evaluation of patient's condition and review of old charts   Care discussed with: admitting provider       Medications Ordered in ED Medications  nicotine (NICODERM CQ - dosed in mg/24 hours) patch 14 mg (14 mg Transdermal Patch Applied 11/13/21 1842)  atorvastatin (LIPITOR) tablet 20 mg (20 mg Oral Given 11/13/21 1841)  lisinopril (ZESTRIL) tablet 20 mg (has no administration in time range)  mometasone-formoterol (DULERA) 100-5 MCG/ACT inhaler 2 puff (has no administration in time range)  montelukast (SINGULAIR) tablet 10 mg (has no administration in time range)  guaiFENesin (MUCINEX) 12 hr tablet 600 mg (has no administration in time range)  diphenhydrAMINE (BENADRYL) capsule 50 mg (has no administration in time range)  sodium chloride flush (NS) 0.9 % injection 3 mL (has no administration in time range)  acetaminophen (TYLENOL) tablet 650 mg (has no administration in time range)    Or  acetaminophen (TYLENOL) suppository 650 mg (has no administration in time range)  ondansetron (ZOFRAN) tablet 4 mg (has no administration in time range)    Or  ondansetron (ZOFRAN) injection 4 mg (has no administration in time range)  pantoprazole (PROTONIX) injection 40 mg (has no administration in time range)  ipratropium-albuterol (DUONEB) 0.5-2.5 (3) MG/3ML nebulizer solution 3 mL (3 mLs Nebulization Given 11/13/21 1845)   HYDROcodone-acetaminophen (NORCO/VICODIN) 5-325 MG per tablet 1 tablet (1 tablet Oral Given 11/13/21 1841)  morphine (PF) 2 MG/ML injection 2 mg (has no administration in time range)  ondansetron (ZOFRAN) injection 4 mg (4 mg Intravenous Given 11/13/21 1214)  lactated ringers bolus 1,000 mL (0 mLs Intravenous Stopped 11/13/21 1532)  fentaNYL (SUBLIMAZE) injection 50 mcg (50 mcg Intravenous Given 11/13/21 1214)  ipratropium-albuterol (DUONEB) 0.5-2.5 (3) MG/3ML nebulizer solution 3 mL (3 mLs Nebulization Given 11/13/21 1215)  potassium chloride SA (KLOR-CON M) CR tablet 40 mEq (40 mEq Oral Given 11/13/21 1319)  iohexol (OMNIPAQUE) 350 MG/ML injection 80 mL (80 mLs Intravenous Contrast Given 11/13/21 1321)  cefTRIAXone (ROCEPHIN) 1 g in sodium chloride 0.9 % 100 mL IVPB (0 g Intravenous Stopped  11/13/21 1513)  pantoprazole (PROTONIX) 80 mg /NS 100 mL IVPB (0 mg Intravenous Stopped 11/13/21 1547)  metoCLOPramide (REGLAN) injection 10 mg (10 mg Intravenous Given 11/13/21 1531)  diphenhydrAMINE (BENADRYL) injection 12.5 mg (12.5 mg Intravenous Given 11/13/21 1531)    ED Course/ Medical Decision Making/ A&P Clinical Course as of 11/13/21 2126  Mon Nov 13, 2021  1402 Bacteria, UA(!): RARE [JL]  1418 Fecal Occult Blood, POC(!): POSITIVE [JL]    Clinical Course User Index [JL] Regan Lemming, MD                           Medical Decision Making Amount and/or Complexity of Data Reviewed Labs: ordered. Decision-making details documented in ED Course. Radiology: ordered.  Risk Prescription drug management. Decision regarding hospitalization.    56 year old female with medical history significant for COPD, endometriosis, syncope, polysubstance abuse, PUD, GERD, obesity who presents to the emergency department with abdominal pain.  The patient initially presented to the emergency department on 11/09/2021 and was diagnosed with COPD exacerbation and subsequently discharged.  She states that she  has had nausea, vomiting, diarrhea over the past couple of days.  She reports right upper quadrant and right lower quadrant abdominal discomfort.  She additionally endorses shortness of breath and wheezing which is at baseline for her COPD.  She denies any hematemesis.  She states that she has been having black diarrhea for the past few days.  She had been taking Pepto-Bismol earlier in the course of her illness.  She denies any dysuria or increased urinary frequency.  She denies any rectal pain.  On arrival, the patient was afebrile, tachycardic P1 12, BP 157/100, saturating 100% on room air.  Physical exam concerning for external and internal hemorrhoids, tenderness to palpation about the right upper quadrant and right lower quadrant of the abdomen.  Patient presenting with dark bowel movements.  Had previously been taking Pepto-Bismol but also has a history of peptic ulcer disease.  The patient has been taking Pepto-Bismol which can cause black stools but did test heme positive and does have a history of peptic ulcer disease.  Given her abdominal discomfort, considered bleeding peptic ulcer in the setting of an enteritis.  Considered PE given the patient's right upper quadrant pain, tachycardia.  Patient does have a history of COPD and had mild wheezing on exam.  Work-up significant for COVID-19 PCR testing and influenza testing negative, urinalysis with large blood, rare bacteria present.  Fecal occult testing was positive, CMP with mild hypokalemia 3.2, mild anion gap acidosis with a bicarbonate of 17, anion gap 16, CBC with leukocytosis to 15.6.  Lipase was normal, urine culture was collected and pending, lactic acid was normal.  With the patient's abdominal discomfort bacteria present on urinalysis, antibiotics were administered with IV Rocephin.  The patient's potassium was replenished with orally.  He administered a DuoNeb for mild wheezing although lower concern for COPD exacerbation at this  time.  CTA PE study: IMPRESSION:  1. Negative for acute PE or thoracic aortic dissection.  2. New small right pleural effusion.  3. Coronary and  aortic Atherosclerosis (ICD10-I70.0).    CT abdomen/pelvis: IMPRESSION:  No finding below the diaphragm to explain abdominal pain. See  separately dictated CT of the chest.    Due to concern for GI bleed, the patient was told cardiology Protonix, administered IV fluid resuscitation.  He has had a roughly two-point hemoglobin drop for measurements 1 year ago.  [facility]  was consulted for further recommendations, plan for admission for further testing.  Spouse medicine was consulted for mission.   Final Clinical Impression(s) / ED Diagnoses Final diagnoses:  Right lower quadrant abdominal pain  Increased anion gap metabolic acidosis  Gastrointestinal hemorrhage, unspecified gastrointestinal hemorrhage type    Rx / DC Orders ED Discharge Orders     None         Regan Lemming, MD 11/13/21 2126

## 2021-11-13 NOTE — H&P (View-Only) (Signed)
[facility] Consult: 2:48 PM 11/13/2021  LOS: 0 days    Referring Provider: Dr Armandina Gemma  Primary Care Physician:  Soyla Dryer, PA-C in [place] Primary Gastroenterologist:  Dr. Abbey Chatters.       Reason for Consultation: Nonbloody nausea, vomiting, right flank pain, diffuse chest pain. FOBT positive.     HPI: Pamela Livingston is a 56 y.o. female.  PMH asthma.  COPD.  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  Leg pain with multiple doppler studies negative for DVT.  Endometriosis.  Polysubstance abuse.  GERD.  Stomach ulcer (decades ago, she is not sure how this was diagnosed as she does not recall a remote endoscopy at that time).  Depression.  Obesity, BMI 33 currently.  Previous surgeries include abdominal hysterectomy, C-section, cholecystectomy, tubal ligation, hysterectomy, cervical cone biopsy.  She denies previous appendectomy or issues w kidney stones.    07/2020 EGD.  For evaluation dysphagia, heartburn despite high dose PPI.  Dr. Abbey Chatters encountered small [place].  He baloon dilated a mild, nonobstructing Schatzki ring.  Erythema in the gastric body, biopsied (path: Benign gastric mucosa with mild reactive changes.  No H. pylori, intestinal metaplasia or malignancy).  Normal examined duodenum to D2.  Esophagus also biopsied (Benign squamous mucosa, No increased intraepithelial eosinophils). 07/2020 colonoscopy by Dr. Abbey Chatters.  For evaluation of positive fecal immunochemical test (FIT).   Found nonbleeding internal hemorrhoids.  Removed 4 mm polyp (path: TA) from the descending colon.  GI issues include chronic, often daily rectal bleeding of minor amounts of blood associated with BMs.  Occasional epigastric discomfort and what she sees as visual bulging in the epigastric region, this resolves after she vomits.  Has  been on meloxicam once daily for about a year.  Compliant with omeprazole 40 mg po bid.  Intermittent, unpredictable solid dysphagia, not frequent.      In the middle to later part of last week, about 4 to 5 days ago, developed acute onset nonbloody nausea and vomiting and pain throughout her chest along with right flank pain.  Stools, which are normally soft, turned a runny consistency.  She was taking bismuth and the stools became black in color as well.  Developed sweats, chills.  Was able to tolerate some Gatorade and water.  Stools were concentrated yellow-colored but no dysuria or frequency.  Patient denies any change in her stable mild DOE and mostly nonproductive cough. Due to symptoms not improving, came to ED for evaluation.  Normotensive.  Tachycardic in the low 100s as high as 120.  Room air sats mid to upper 90%.  No fever. K 3.2.  normal GFR.   LFTs not elevated.  Albumin low at 2.8.   Hgb 11.8.  MCV 91 WBCs 15.6.  platelets 412. U/A: 21-50 RBCs, rare bacteria, 0-5 squames, 0-5 WBCs.  Greater than 80 ketones. CTAP w contrast:  hepatic steatosis.  GB surgically absent.  Nonspecific right ureter calcifications, possibly related to prior embolization procedure.  Urinary bladder fluid-filled but no wall thickening.  Uncomplicated colon diverticulosis.  Status post appendectomy. CT  angio of chest: No PE.  New small R pleural effusions.  Coronary and aortic atherosclerosis.    Fm Hx negative for colorectal dz.   Social history: Previous crack cocaine use for 10 years, has not smoked crack for over 20 years.  Smokes marijuana about once daily.  Smokes half pack cigarettes daily.  No EtOH for many years.   Past Medical History:  Diagnosis Date   Asthma    Chronic pelvic pain in female    COPD (chronic obstructive pulmonary disease) ([place])    Endometriosis    Polysubstance abuse ([place])    marijuana, cocaine, benzos   Stomach ulcer    Suicide attempt ([place]) 2008   by phenergan overdose    Syncope     Past Surgical History:  Procedure Laterality Date   ABDOMINAL HYSTERECTOMY     BALLOON DILATION N/A 07/26/2020   Procedure: BALLOON DILATION;  Surgeon: Eloise Harman, DO;  [facility];  Service: Endoscopy;  Laterality: N/A;   BIOPSY  07/26/2020   Procedure: BIOPSY;  Surgeon: Eloise Harman, DO;  [facility];  Service: Endoscopy;;   CERVICAL CONE BIOPSY     cervical cancer   CESAREAN SECTION  1993   CHOLECYSTECTOMY     COLONOSCOPY WITH PROPOFOL N/A 07/26/2020   Procedure: COLONOSCOPY WITH PROPOFOL;  Surgeon: Eloise Harman, DO;  [facility]: [facility];  Service: Endoscopy;  Laterality: N/A;  12:00pm   ESOPHAGOGASTRODUODENOSCOPY (EGD) WITH PROPOFOL N/A 07/26/2020   Procedure: ESOPHAGOGASTRODUODENOSCOPY (EGD) WITH PROPOFOL;  Surgeon: Eloise Harman, DO;  [facility];  Service: Endoscopy;  Laterality: N/A;   RADICAL HYSTERECTOMY WITH TRANSPOSITION OF OVARIES     TOOTH EXTRACTION     TUBAL LIGATION  1993    Prior to Admission medications   Medication Sig Start Date End Date Taking? Authorizing Provider  acetaminophen (TYLENOL) 500 MG tablet Take 1,500 mg by mouth every 6 (six) hours as needed for moderate pain.   Yes [provider]  albuterol (PROVENTIL) (2.5 MG/3ML) 0.083% nebulizer solution INHALE 1 VIAL VIA NEBULIZER EVERY 6 HOURS AS NEEDED FOR WHEEZING OR SHORTNESS OF BREATH Patient taking differently: Take 2.5 mg by nebulization every 6 (six) hours as needed for wheezing or shortness of breath. 10/23/21  Yes Soyla Dryer, PA-C  albuterol (VENTOLIN HFA) 108 (90 Base) MCG/ACT inhaler INHALE 2 PUFFS BY MOUTH EVERY 6 HOURS AS NEEDED FOR COUGHING, WHEEZING, OR SHORTNESS OF BREATH Patient taking differently: Inhale 2 puffs into the lungs every 6 (six) hours as needed for shortness of breath or wheezing. 10/23/21  Yes Soyla Dryer, PA-C  atorvastatin (LIPITOR) 20 MG tablet TAKE 1 Tablet BY MOUTH ONCE DAILY Patient taking  differently: Take 20 mg by mouth daily. 10/23/21  Yes Soyla Dryer, PA-C  diphenhydrAMINE (BENADRYL) 25 MG tablet Take 50 mg by mouth at bedtime.   Yes [provider]  fluticasone-salmeterol (WIXELA INHUB) 100-50 MCG/ACT AEPB Inhale 1 puff into the lungs 2 (two) times daily.   Yes [provider]  guaiFENesin (MUCINEX) 600 MG 12 hr tablet Take 600 mg by mouth daily.   Yes [provider]  lisinopril (ZESTRIL) 20 MG tablet TAKE 1 Tablet BY MOUTH ONCE DAILY Patient taking differently: Take 20 mg by mouth daily. 10/23/21  Yes Soyla Dryer, PA-C  loratadine-pseudoephedrine (CLARITIN-D 24-HOUR) 10-240 MG 24 hr tablet Take 1 tablet by mouth in the morning.    Yes [provider]  meloxicam (MOBIC) 7.5 MG tablet Take 1  tablet (7.5 mg total) by mouth daily. 09/12/21  Yes Carole Civil, MD  montelukast (SINGULAIR) 10 MG tablet TAKE 1 Tablet BY MOUTH ONCE DAILY AS NEEDED Patient taking differently: Take 10 mg by mouth daily as needed (asthma). 10/23/21  Yes Soyla Dryer, PA-C  nitroGLYCERIN (NITROSTAT) 0.4 MG SL tablet Place 1 tablet (0.4 mg total) under the tongue every 5 (five) minutes as needed for chest pain. 10/02/18  Yes Soyla Dryer, PA-C  omeprazole (PRILOSEC) 40 MG capsule TAKE 1 Capsule  BY MOUTH TWICE DAILY Patient taking differently: Take 40 mg by mouth 2 (two) times daily. 10/23/21  Yes Soyla Dryer, PA-C  cyclobenzaprine (FLEXERIL) 10 MG tablet Take 1 tablet (10 mg total) by mouth 3 (three) times daily as needed for muscle spasms. Patient not taking: Reported on 09/13/2021 07/18/21   Soyla Dryer, PA-C  fluticasone-salmeterol (ADVAIR) 100-50 MCG/ACT AEPB INHALE 1 PUFF BY MOUTH TWICE DAILY (EVERY 12 HOURS). RINSE MOUTH AFTER EACH USE. (EVERY MORNING AND EVERY NIGHT AT BEDTIME) Patient not taking: Reported on 11/13/2021 10/23/21   Soyla Dryer, PA-C  meloxicam [facility]) 7.5 MG tablet Take 1 tablet by mouth once daily Patient not taking:  Reported on 11/13/2021 09/12/21   Carole Civil, MD    Scheduled Meds:  Infusions:  sodium chloride     And   sodium chloride     cefTRIAXone (ROCEPHIN)  IV 1 g (11/13/21 1430)   pantoprazole     pantoprazole     PRN Meds:    Allergies as of 11/13/2021 - Review Complete 11/13/2021  Allergen Reaction Noted   Asa [aspirin] Anaphylaxis, Hives, and Swelling 08/16/2011   Codeine Nausea Only 09/20/2020    Family History  Problem Relation Age of Onset   Heart disease Mother    Hypertension Mother    Diabetes Mother    Rheum arthritis Mother    Heart disease Father    Hypertension Father    Heart attack Sister    Stroke Sister    Dementia Sister    Heart attack Maternal Uncle    Heart attack Maternal Uncle    COPD Paternal Grandfather    Colon cancer Neg Hx     Social History   Socioeconomic History   Marital status: Divorced    Spouse name: Not on file   Number of children: Not on file   Years of education: Not on file   Highest education level: Not on file  Occupational History   Not on file  Tobacco Use   Smoking status: Some Days    Packs/day: 0.50    Years: 27.00    Total pack years: 13.50    Types: Cigarettes   Smokeless tobacco: Never   Tobacco comments:    1 cig every other day  Vaping Use   Vaping Use: Never used  Substance and Sexual Activity   Alcohol use: Not Currently   Drug use: Yes    Frequency: 7.0 times per week    Types: Marijuana   Sexual activity: Yes    Birth control/protection: Surgical  Other Topics Concern   Not on file  Social History Narrative   Not on file   Social Determinants of Health   Financial Resource Strain: Not on file  Food Insecurity: Not on file  Transportation Needs: Not on file  Physical Activity: Not on file  Stress: Not on file  Social Connections: Not on file  Intimate Partner Violence: Not on file    REVIEW OF SYSTEMS: Constitutional:  Feels tired and weak. ENT:  No nose bleeds Pulm:  Chronic, predominantly nonproductive, cough.  Stable dyspnea with moderate to intense exertion. CV:  No palpitations, no LE edema.  Her chest pain is not anginal in character. GU: Urine is darker in color.  No dysuria.  No obvious hematuria, no frequency GI: See HPI. Heme: Denies unusual or excessive bleeding or bruising. Transfusions: None. Neuro:  No headaches, no peripheral tingling or numbness Derm:  No itching, no rash or sores.  Endocrine:  No polyuria or dysuria Immunization: Reviewed. Travel: Not queried.   PHYSICAL EXAM: Vital signs in last 24 hours: Vitals:   11/13/21 1400 11/13/21 1438  BP: 136/71   Pulse: (!) 106   Resp: (!) 31   Temp:  97.8 F (36.6 C)  SpO2: 93%    Wt Readings from Last 3 Encounters:  11/13/21 74.8 kg  11/09/21 74.8 kg  09/13/21 76.2 kg    General: Patient looks mildly uncomfortable, mildly ill.  Not toxic-appearing.  She is alert resting on the stretcher. Head: No facial asymmetry or swelling.  No signs of head trauma. Eyes: No scleral icterus.  No conjunctival pallor. Ears: Not hard of hearing Nose: No congestion or discharge Mouth: Fair dentition.  Tongue midline.  Mucosa moist, pink, clear. Neck: No JVD, no masses, no thyromegaly. Lungs: Clear bilaterally.  Hoarse, husky vocal quality.  Occasional dry cough.  No dyspnea. Heart: RRR.  No MRG.  S1, S2 present.  Tenderness to palpation across the chest bilaterally. Abdomen: Soft.  Minimal tenderness over on right upper quadrant.  No HSM, masses, bruits, hernias.  Bowel sounds active..   Rectal: Deferred.  Stool tested by ED staff was FOBT positive, dark in color. Musc/Skeltl: No joint redness, swelling or gross deformity. Extremities: No CCE. Neurologic: Alert.  Able to provide good history.  Moves all 4 limbs without difficulty or gross deficit.  Formal strength testing not performed.  No tremors. Skin: No obvious rash, sores, suspicious lesions, telangiectasia Nodes: No cervical  adenopathy Psych: Calm, cooperative, pleasant.  Fluid speech  Intake/Output from previous day: No intake/output data recorded. Intake/Output this shift: No intake/output data recorded.  LAB RESULTS: Recent Labs    11/13/21 1043  WBC 15.6*  HGB 11.8*  HCT 33.6*  PLT 412*   BMET Lab Results  Component Value Date   NA 135 11/13/2021   NA 138 11/09/2021   NA 137 06/15/2021   K 3.2 (L) 11/13/2021   K 2.9 (L) 11/09/2021   K 4.8 06/15/2021   CL 102 11/13/2021   CL 106 11/09/2021   CL 106 06/15/2021   CO2 17 (L) 11/13/2021   CO2 20 (L) 11/09/2021   CO2 25 06/15/2021   GLUCOSE 94 11/13/2021   GLUCOSE 118 (H) 11/09/2021   GLUCOSE 94 06/15/2021   BUN 9 11/13/2021   BUN 9 11/09/2021   BUN 20 06/15/2021   CREATININE 0.76 11/13/2021   CREATININE 0.76 11/09/2021   CREATININE 0.87 06/15/2021   CALCIUM 8.9 11/13/2021   CALCIUM 9.3 11/09/2021   CALCIUM 9.4 06/15/2021   LFT Recent Labs    11/13/21 1043  PROT 6.5  ALBUMIN 2.8*  AST 12*  ALT 11  ALKPHOS 83  BILITOT 0.8   PT/INR Lab Results  Component Value Date   INR 1.0 12/08/2006   Hepatitis Panel No results for input(s): "HEPBSAG", "HCVAB", "HEPAIGM", "HEPBIGM" in the last 72 hours. C-Diff No components found for: "CDIFF" Lipase     Component Value Date/Time  LIPASE 20 11/13/2021 1043    Drugs of Abuse     Component Value Date/Time   LABOPIA NONE DETECTED 03/02/2015 0225   COCAINSCRNUR NONE DETECTED 03/02/2015 0225   LABBENZ NONE DETECTED 03/02/2015 0225   AMPHETMU NONE DETECTED 03/02/2015 0225   THCU POSITIVE (A) 03/02/2015 0225   LABBARB NONE DETECTED 03/02/2015 0225     RADIOLOGY STUDIES: CT ABDOMEN PELVIS W CONTRAST  Result Date: 11/13/2021 CLINICAL DATA:  Abdominal pain EXAM: CT ABDOMEN AND PELVIS WITH CONTRAST TECHNIQUE: Multidetector CT imaging of the abdomen and pelvis was performed using the standard protocol following bolus administration of intravenous contrast. RADIATION DOSE REDUCTION:  This exam was performed according to the departmental dose-optimization program which includes automated exposure control, adjustment of the mA and/or kV according to patient size and/or use of iterative reconstruction technique. CONTRAST:  15m OMNIPAQUE IOHEXOL 350 MG/ML SOLN COMPARISON:  None Available. FINDINGS: Lower chest: Small right pleural effusion with associated passive atelectasis. Hepatobiliary: Hepatic steatosis. Liver has a contour. No focal liver lesions are seen. Portal veins are contrast opacified. Gallbladder is surgically absent. No evidence of intra or extrahepatic biliary duct dilatation. Pancreas: No evidence of Peri pancreatic fat stranding. No evidence of pancreatic ductal dilatation. Spleen: Spleen is normal in size. No evidence of focal lesion or perisplenic fluid. Adrenals/Urinary Tract: Bilateral adrenal glands are normal in appearance. Evidence of hydronephrosis or nephrolithiasis. No focal renal lesions are visualized. There are nonspecific calcifications along the course of the right ureter, possibly related to prior embolization procedure. The urinary bladder is fluid-filled without evidence of focal wall thickening. Stomach/Bowel: The stomach, small bowel, large bowel are normal in caliber without evidence of bowel obstruction. There is diverticulosis without definitive evidence of diverticulitis. Status post appendectomy. Vascular/Lymphatic: No significant vascular findings are present. No enlarged abdominal or pelvic lymph nodes. Reproductive: Uterus and bilateral adnexa are unremarkable. Other: No abdominal wall hernia or abnormality. No abdominopelvic ascites. Musculoskeletal: No acute or significant osseous findings. IMPRESSION: No finding below the diaphragm to explain abdominal pain. See separately dictated CT of the chest. Electronically Signed   By: HMarin RobertsM.D.   On: 11/13/2021 13:47   CT Angio Chest PE W and/or Wo Contrast  Result Date: 11/13/2021 CLINICAL  DATA:  Right sided pain, emesis EXAM: CT ANGIOGRAPHY CHEST WITH CONTRAST TECHNIQUE: Multidetector CT imaging of the chest was performed using the standard protocol during bolus administration of intravenous contrast. Multiplanar CT image reconstructions and MIPs were obtained to evaluate the vascular anatomy. RADIATION DOSE REDUCTION: This exam was performed according to the departmental dose-optimization program which includes automated exposure control, adjustment of the mA and/or kV according to patient size and/or use of iterative reconstruction technique. CONTRAST:  859mOMNIPAQUE IOHEXOL 350 MG/ML SOLN COMPARISON:  12/12/2019 FINDINGS: Cardiovascular: Heart size normal. No pericardial effusion. The RV is nondilated. Scattered coronary calcifications. Adequate contrast opacification of the thoracic aorta with no evidence of dissection, aneurysm, or stenosis. There is classic 3-vessel brachiocephalic arch anatomy without proximal stenosis. Scattered partially calcified plaque in the descending thoracic aorta. Mediastinum/Nodes: No mass or adenopathy. Lungs/Pleura: New small right pleural effusion. No pneumothorax. Dependent atelectasis at the right lung base. Platelike linear scarring or subsegmental atelectasis in the left lower lobe as before. Upper Abdomen: No acute findings. Musculoskeletal: Mild spondylitic changes in the lower cervical, mid and lower thoracic spine. No acute findings. Review of the MIP images confirms the above findings. IMPRESSION: 1. Negative for acute PE or thoracic aortic dissection. 2. New small right pleural  effusion. 3. Coronary and  aortic Atherosclerosis (ICD10-I70.0). Electronically Signed   By: Lucrezia Europe M.D.   On: 11/13/2021 13:31     IMPRESSION:   Black stools.  These tested heme positive today.  She has been taking bismuth at home which will cause black stools but not FOBT positive stools.   Nonbloody nausea, vomiting, generalized non-anginal chest pain and TTP.  Hx  GERD and possible remote stomach ulcer.  At EGD in July 2022 Dr. Abbey Chatters dilated a small Schatzki's ring and noted gastric erythema, biopsies showed mild reactive changes but no H. pylori etc.  Esophageal pathology also benign.  Patient compliant with twice daily high-dose omeprazole.  Also takes meloxicam daily.  Frequent minor rectal bleeding.  Hx positive fit test.  07/2020 colonoscopy with internal hemorrhoids, not bleeding at that time.  He removed a 4 mm, tubular adenomatous polyp from the descending colon.  Due for repeat colonoscopy in 07/2025.    R flank pain.  UA shows significant RBCs but rare bacteria and WBCs.  The CT demonstrates calcifications but no obstruction in the right ureter. Urine clx pndg.      PLAN:       EGD?, would be tmrw.  Change to Protonix 40 mg IV bid (to replace Protonix gtt).  Anti-nauseal meds, pain meds as needed.  Azucena Freed  11/13/2021, 2:48 PM Phone [phone]    [place] Attending   I have taken an interval history, reviewed the chart and examined the patient. I agree with the Advanced Practitioner's note, impression and recommendations.    Cause of her problems not clear. No good evidence for nephrolithiasis but I wonder. I suspect the black stools were from Pepto Bismol and the heme + stool insignificant and from chronic rectal bleeding, though not certain.  Given diagnostic dilemma will do EGD  The risks and benefits as well as alternatives of endoscopic procedure(s) have been discussed and reviewed. All questions answered. The patient agrees to proceed.  Gatha Mayer, MD, [facility] See Shea Evans on call - gastroenterology for best contact person 11/13/2021 5:00 PM

## 2021-11-13 NOTE — H&P (Addendum)
History and Physical    Patient: Pamela Livingston STM:[id number] DOB: [birth year] DOA: 11/13/2021 DOS: the patient was seen and examined on 11/13/2021 PCP: Soyla Dryer, PA-C  Patient coming from: Home  Chief Complaint:  Chief Complaint  Patient presents with   Abdominal Pain   HPI: Pamela Livingston is a 56 y.o. female with medical history significant of COPD, arthritis, PUD, GERD, obesity, and polysubstance abuse who presents with complaints of abdominal pain.  She reports having right lower quadrant and epigastric abdominal pain for the last 5 days.  Reported associated symptoms of nausea, vomiting, diarrhea, hot/cold flashes, and dark urine.  Emesis was reported to be nonbloody in appearance.  Patient had been taking Pepto-Bismol without improvement in symptoms.  Stools have been reported to be dark black without clear signs of blood present.  Patient notes that she continues to smoke about half pack cigarettes per day on average and chronically wheezes.  Denies any NSAID use, but is on meloxicam and takes it daily for arthritis pain.  Patient had colonoscopy and EGD last 07/26/2020 for positive fecal occult test along with dysphagia/heartburn which had noted nonbleeding internal hemorrhoid, one 4 mm polyp of the descending colon which was removed, small hiatal hernia, mild Slutsky ring which was dilated, and erythematous mucosa in the gastric body.  Upon admission into the patient was noted to be tachypneic and tachycardic with vital signs otherwise maintained.  Labs significant for WBC 15.6, hemoglobin 11.8, potassium 3.2, CO2 17, glucose 94, anion gap 16, and i-STAT hCG level 150.9.  Urinalysis noted large hemoglobin, 80 ketones, rare bacteria, 21-50 RBC/hpf.  CT angiogram of the chest along with CT abdomen/pelvis with contrast had been obtained noting new small right pleural effusion, no PE, and no other acute abdominal findings.  Fecal occult testing was positive.   Patient has been given 1 L of  lactated Ringer's, potassium chloride 40 mEq Reglan, DuoNeb breathing treatment, fentanyl for pain, Reglan, and started on a Protonix drip.  Review of Systems: As mentioned in the history of present illness. All other systems reviewed and are negative. Past Medical History:  Diagnosis Date   Asthma    Chronic pelvic pain in female    COPD (chronic obstructive pulmonary disease) ([place])    Endometriosis    Polysubstance abuse ([place])    marijuana, cocaine, benzos   Stomach ulcer    Suicide attempt ([place]) 2008   by phenergan overdose   Syncope    Past Surgical History:  Procedure Laterality Date   ABDOMINAL HYSTERECTOMY     BALLOON DILATION N/A 07/26/2020   Procedure: BALLOON DILATION;  Surgeon: Eloise Harman, DO;  [facility];  Service: Endoscopy;  Laterality: N/A;   BIOPSY  07/26/2020   Procedure: BIOPSY;  Surgeon: Eloise Harman, DO;  [facility]: [facility];  Service: Endoscopy;;   CERVICAL CONE BIOPSY     cervical cancer   CESAREAN SECTION  1993   CHOLECYSTECTOMY     COLONOSCOPY WITH PROPOFOL N/A 07/26/2020   Procedure: COLONOSCOPY WITH PROPOFOL;  Surgeon: Eloise Harman, DO;  [facility]: [facility];  Service: Endoscopy;  Laterality: N/A;  12:00pm   ESOPHAGOGASTRODUODENOSCOPY (EGD) WITH PROPOFOL N/A 07/26/2020   Procedure: ESOPHAGOGASTRODUODENOSCOPY (EGD) WITH PROPOFOL;  Surgeon: Eloise Harman, DO;  [facility];  Service: Endoscopy;  Laterality: N/A;   RADICAL HYSTERECTOMY WITH TRANSPOSITION OF OVARIES     TOOTH EXTRACTION     TUBAL LIGATION  1993   Social History:  reports that she has been smoking cigarettes. She has a 13.50 pack-year smoking history. She has never used smokeless tobacco. She reports that she does not currently use alcohol. She reports current drug use. Frequency: 7.00 times per week. Drug: Marijuana.  Allergies  Allergen Reactions   Asa [Aspirin] Anaphylaxis, Hives and Swelling   Codeine Nausea Only    Family History   Problem Relation Age of Onset   Heart disease Mother    Hypertension Mother    Diabetes Mother    Rheum arthritis Mother    Heart disease Father    Hypertension Father    Heart attack Sister    Stroke Sister    Dementia Sister    Heart attack Maternal Uncle    Heart attack Maternal Uncle    COPD Paternal Grandfather    Colon cancer Neg Hx     Prior to Admission medications   Medication Sig Start Date End Date Taking? Authorizing Provider  acetaminophen (TYLENOL) 500 MG tablet Take 1,500 mg by mouth every 6 (six) hours as needed for moderate pain.   Yes [provider]  albuterol (PROVENTIL) (2.5 MG/3ML) 0.083% nebulizer solution INHALE 1 VIAL VIA NEBULIZER EVERY 6 HOURS AS NEEDED FOR WHEEZING OR SHORTNESS OF BREATH Patient taking differently: Take 2.5 mg by nebulization every 6 (six) hours as needed for wheezing or shortness of breath. 10/23/21  Yes Soyla Dryer, PA-C  albuterol (VENTOLIN HFA) 108 (90 Base) MCG/ACT inhaler INHALE 2 PUFFS BY MOUTH EVERY 6 HOURS AS NEEDED FOR COUGHING, WHEEZING, OR SHORTNESS OF BREATH Patient taking differently: Inhale 2 puffs into the lungs every 6 (six) hours as needed for shortness of breath or wheezing. 10/23/21  Yes Soyla Dryer, PA-C  atorvastatin (LIPITOR) 20 MG tablet TAKE 1 Tablet BY MOUTH ONCE DAILY Patient taking differently: Take 20 mg by mouth daily. 10/23/21  Yes Soyla Dryer, PA-C  diphenhydrAMINE (BENADRYL) 25 MG tablet Take 50 mg by mouth at bedtime.   Yes [provider]  fluticasone-salmeterol (WIXELA INHUB) 100-50 MCG/ACT AEPB Inhale 1 puff into the lungs 2 (two) times daily.   Yes [provider]  guaiFENesin (MUCINEX) 600 MG 12 hr tablet Take 600 mg by mouth daily.   Yes [provider]  lisinopril (ZESTRIL) 20 MG tablet TAKE 1 Tablet BY MOUTH ONCE DAILY Patient taking differently: Take 20 mg by mouth daily. 10/23/21  Yes Soyla Dryer, PA-C  loratadine-pseudoephedrine (CLARITIN-D  24-HOUR) 10-240 MG 24 hr tablet Take 1 tablet by mouth in the morning.    Yes [provider]  meloxicam (MOBIC) 7.5 MG tablet Take 1 tablet (7.5 mg total) by mouth daily. 09/12/21  Yes Carole Civil, MD  montelukast (SINGULAIR) 10 MG tablet TAKE 1 Tablet BY MOUTH ONCE DAILY AS NEEDED Patient taking differently: Take 10 mg by mouth daily as needed (asthma). 10/23/21  Yes Soyla Dryer, PA-C  nitroGLYCERIN (NITROSTAT) 0.4 MG SL tablet Place 1 tablet (0.4 mg total) under the tongue every 5 (five) minutes as needed for chest pain. 10/02/18  Yes Soyla Dryer, PA-C  omeprazole (PRILOSEC) 40 MG capsule TAKE 1 Capsule  BY MOUTH TWICE DAILY Patient taking differently: Take 40 mg by mouth 2 (two) times daily. 10/23/21  Yes Soyla Dryer, PA-C  cyclobenzaprine (FLEXERIL) 10 MG tablet Take 1 tablet (10 mg total) by mouth 3 (three) times daily as needed for muscle spasms. Patient not taking: Reported on 09/13/2021 07/18/21   Soyla Dryer, PA-C  fluticasone-salmeterol (ADVAIR) 100-50 MCG/ACT AEPB INHALE 1 PUFF BY  MOUTH TWICE DAILY (EVERY 12 HOURS). RINSE MOUTH AFTER EACH USE. (EVERY MORNING AND EVERY NIGHT AT BEDTIME) Patient not taking: Reported on 11/13/2021 10/23/21   Soyla Dryer, PA-C  meloxicam [facility]) 7.5 MG tablet Take 1 tablet by mouth once daily Patient not taking: Reported on 11/13/2021 09/12/21   Carole Civil, MD    Physical Exam: Vitals:   11/13/21 1245 11/13/21 1330 11/13/21 1400 11/13/21 1438  BP: 134/89 (!) 144/84 136/71   Pulse: (!) 105 (!) 104 (!) 106   Resp: (!) 27 (!) 27 (!) 31   Temp:    97.8 F (36.6 C)  TempSrc:    Oral  SpO2: 93% 96% 93%   Weight:      Height:       Exam  Constitutional: Middle-aged female being distress at this time Eyes: PERRL, lids and conjunctivae normal ENMT: Mucous membranes are moist. Posterior pharynx clear of any exudate or lesions. Neck: normal, supple Respiratory: clear to auscultation bilaterally, no wheezing,  no crackles. Normal respiratory effort.  Cardiovascular: Regular rate and rhythm, no murmurs / rubs / gallops. No extremity edema. 2+ pedal pulses. No carotid bruits.  Abdomen: Tenderness to palpation epigastrically and of the right lower quadrant.  Bowel sounds present all 4 quadrants. Musculoskeletal: no clubbing / cyanosis. No joint deformity upper and lower extremities. Good ROM, no contractures. Normal muscle tone.  Skin: no rashes, lesions, ulcers. No induration Neurologic: CN 2-12 grossly intact. Strength 5/5 in all 4.  Psychiatric: Normal judgment and insight. Alert and oriented x 3. Normal mood.   Data Reviewed:  EKG with tachycardia in 102 bpm.  Reviewed labs, imaging, and pertinent records as noted above in HPI.  Assessment and Plan: Acute blood loss anemia 2/2 GI bleed Acute. Patient reports having dark black stools, but has been taking Pepto-Bismol.  Stool guaiac positive for blood.  Hemoglobin 11.8 g/dL and is slightly lower than baseline which had been within normal limits at 12.3 on 10/19.  Patient is on meloxicam which she takes daily for arthritis pain.  She does not appear to have an acute overt bleed.  Last EGD & colonoscopy from 07/2020 noted nonbleeding internal hemorrhoid, one 4 mm polyp of the descending colon which was removed, small hiatal hernia, mild Slutsky ring which was dilated, and erythematous mucosa in the gastric body. -Admit to a medical telemetry bed -Hold meloxicam -Continue Protonix drip until completion and switching to scheduled Protonix per GI -Clear liquid diet and n.p.o. after midnight -Check H&H every 6hrs x2 -EGD planned for 8 AM -[facility] consulted, will follow-up for any further recommendations  Leukocytosis Acute.  WBC elevated at 15.6.  Possibly reactive given patient's episodes of nausea, vomiting, and diarrhea. -Recheck CBC tomorrow morning -Consider check blood cultures if patient develops fever and resume antibiotics  Nausea,  vomiting, diarrhea Patient reports 5-day history of symptoms. Noted to have RLQ abd pain although no clear cause for symptoms appreciated on CT. -Monitor intake and output  Hematuria  Acute.  Patient noted to have concern for hematuria with rare bacteria on urinalysis without clear signs of infection.  Urinalysis from 10/19 noted large hemoglobin as well with rare bacteria, but also had 11-20 squamous epithelial cells to suggest possibly contaminated.  Patient denied any complaints of dysuria, but noted dark urine which is consistent with bleeding.  CT scan of the abdomen pelvis did not note any clear cause for patient's symptoms.  Patient had initially been given Rocephin. -Follow-up urine culture -Did not continue  Rocephin at this time as no clear concern for infection  Hypokalemia Acute.  Patient's potassium was 3.2.  Patient has been given 40 mEq of potassium chloride p.o. -Continue to monitor and replace as needed  COPD, without acute exacerbation Patient currently without wheezing on physical exam. -Continue pharmacy substitution for Wixela inhaler -DuoNebs as needed for shortness of breath/wheezing  Essential hypertension Initially elevated up to 157/100.  Home blood pressure regimen includes lisinopril 20 mg daily . -Continue lisinopril  Elevated i-STAT beta-hCG Acute.  I-STAT beta-hCG elevated at 150.9.  Documented history of history of hysterectomy, but uterus still present on CT scan of the abdomen. patient states that her ovaries were removed and suspect false positive as no other acute abnormality appreciated on the CT.  Tobacco abuse Patient reports smoking half pack of cigarettes per day on average -Counseled on the need of cessation of tobacco use -Nicotine patch offered  Polysubstance abuse Patient admits to marijuana use and has prior history cociane use last documented in 2008  Obesity BMI 33.33 kg/m  DVT prophylaxis: SCDs Advance Care Planning:   Code Status:  Full Code    Consults: GI  Family Communication: none   Severity of Illness: The appropriate patient status for this patient is OBSERVATION. Observation status is judged to be reasonable and necessary in order to provide the required intensity of service to ensure the patient's safety. The patient's presenting symptoms, physical exam findings, and initial radiographic and laboratory data in the context of their medical condition is felt to place them at decreased risk for further clinical deterioration. Furthermore, it is anticipated that the patient will be medically stable for discharge from the hospital within 2 midnights of admission.   Author: Norval Morton, MD 11/13/2021 3:06 PM  For on call review [email or web address].

## 2021-11-13 NOTE — ED Triage Notes (Signed)
Complains of right side pain since last Wednesday. Reports she has been throwing up since last Wednesday.  Reports having black diarrhea.  Denies blood thinners.

## 2021-11-14 ENCOUNTER — Encounter (HOSPITAL_COMMUNITY)
Admission: EM | Disposition: A | Discharge status: Home / Self Care | Payer: Self-pay | Source: Home / Self Care | Attending: Family Medicine

## 2021-11-14 ENCOUNTER — Inpatient Hospital Stay (HOSPITAL_COMMUNITY): Payer: Medicaid Other | Admitting: Anesthesiology

## 2021-11-14 ENCOUNTER — Encounter (HOSPITAL_COMMUNITY): Payer: Self-pay | Admitting: Family Medicine

## 2021-11-14 DIAGNOSIS — D649 Anemia, unspecified: Secondary | ICD-10-CM

## 2021-11-14 DIAGNOSIS — R195 Other fecal abnormalities: Secondary | ICD-10-CM

## 2021-11-14 DIAGNOSIS — F17210 Nicotine dependence, cigarettes, uncomplicated: Secondary | ICD-10-CM

## 2021-11-14 DIAGNOSIS — K922 Gastrointestinal hemorrhage, unspecified: Secondary | ICD-10-CM | POA: Diagnosis present

## 2021-11-14 DIAGNOSIS — K317 Polyp of stomach and duodenum: Secondary | ICD-10-CM

## 2021-11-14 DIAGNOSIS — J449 Chronic obstructive pulmonary disease, unspecified: Secondary | ICD-10-CM

## 2021-11-14 DIAGNOSIS — R1115 Cyclical vomiting syndrome unrelated to migraine: Secondary | ICD-10-CM

## 2021-11-14 DIAGNOSIS — K921 Melena: Secondary | ICD-10-CM

## 2021-11-14 HISTORY — PX: BIOPSY: SHX5522

## 2021-11-14 HISTORY — PX: ESOPHAGOGASTRODUODENOSCOPY (EGD) WITH PROPOFOL: SHX5813

## 2021-11-14 LAB — ABO/RH: ABO/RH(D): O POS

## 2021-11-14 LAB — BRAIN NATRIURETIC PEPTIDE: B Natriuretic Peptide: 39.8 pg/mL (ref 0.0–100.0)

## 2021-11-14 LAB — HCG, QUANTITATIVE, PREGNANCY: hCG, Beta Chain, Quant, S: 3 m[IU]/mL (ref <5)

## 2021-11-14 LAB — BASIC METABOLIC PANEL
Anion gap: 11 (ref 5–15)
BUN: 7 mg/dL (ref 6–20)
CO2: 19 mmol/L — ABNORMAL LOW (ref 22–32)
Calcium: 8.5 mg/dL — ABNORMAL LOW (ref 8.9–10.3)
Chloride: 104 mmol/L (ref 98–111)
Creatinine, Ser: 0.7 mg/dL (ref 0.44–1.00)
GFR, Estimated: >60 mL/min (ref >60)
Glucose, Bld: 89 mg/dL (ref 70–99)
Potassium: 3.1 mmol/L — ABNORMAL LOW (ref 3.5–5.1)
Sodium: 134 mmol/L — ABNORMAL LOW (ref 135–145)

## 2021-11-14 LAB — URINE CULTURE: Culture: NO GROWTH

## 2021-11-14 LAB — CBC
HCT: 31.3 % — ABNORMAL LOW (ref 36.0–46.0)
Hemoglobin: 10.5 g/dL — ABNORMAL LOW (ref 12.0–15.0)
MCH: 31.4 pg (ref 26.0–34.0)
MCHC: 33.5 g/dL (ref 30.0–36.0)
MCV: 93.7 fL (ref 80.0–100.0)
Platelets: 416 10*3/uL — ABNORMAL HIGH (ref 150–400)
RBC: 3.34 MIL/uL — ABNORMAL LOW (ref 3.87–5.11)
RDW: 12.8 % (ref 11.5–15.5)
WBC: 12.1 10*3/uL — ABNORMAL HIGH (ref 4.0–10.5)
nRBC: 0 % (ref 0.0–0.2)

## 2021-11-14 LAB — MAGNESIUM: Magnesium: 1.7 mg/dL (ref 1.7–2.4)

## 2021-11-14 SURGERY — ESOPHAGOGASTRODUODENOSCOPY (EGD) WITH PROPOFOL
Anesthesia: Monitor Anesthesia Care

## 2021-11-14 MED ORDER — PROPOFOL 10 MG/ML IV BOLUS
INTRAVENOUS | Status: DC | PRN
  Administered 2021-11-14: 30 mg via INTRAVENOUS

## 2021-11-14 MED ORDER — LACTATED RINGERS IV SOLN: INTRAVENOUS | Status: DC | PRN

## 2021-11-14 MED ORDER — BUTAMBEN-TETRACAINE-BENZOCAINE 2-2-14 % EX AERO
INHALATION_SPRAY | CUTANEOUS | Status: DC | PRN
  Administered 2021-11-14: 2 via TOPICAL

## 2021-11-14 MED ORDER — SODIUM CHLORIDE 0.9 % IV SOLN: INTRAVENOUS | Status: DC

## 2021-11-14 MED ORDER — PROPOFOL 500 MG/50ML IV EMUL
INTRAVENOUS | Status: DC | PRN
  Administered 2021-11-14: 200 ug/kg/min via INTRAVENOUS

## 2021-11-14 MED ORDER — POTASSIUM CHLORIDE 10 MEQ/100ML IV SOLN
10.0000 meq | INTRAVENOUS | Status: AC
  Administered 2021-11-14 (×4): 10 meq via INTRAVENOUS
  Filled 2021-11-14 (×8): qty 100

## 2021-11-14 SURGICAL SUPPLY — 15 items

## 2021-11-14 NOTE — ED Notes (Signed)
RN wakes up patient to draw morning labs. Patient requests RN to use restroom, reports having continued head pain described as pounding. Also reports having no relief since pain meds administered earlier.

## 2021-11-14 NOTE — ED Notes (Signed)
MD notified of patient continued head pain and having no relief from pain meds. Patient requesting a different option for "my pounding headache".

## 2021-11-14 NOTE — Transfer of Care (Signed)
Immediate Anesthesia Transfer of Care Note  Patient: Pamela Livingston  Procedure(s) Performed: ESOPHAGOGASTRODUODENOSCOPY (EGD) WITH PROPOFOL BIOPSY  Patient Location: PACU  Anesthesia Type:MAC  Level of Consciousness: awake, alert , and patient cooperative  Airway & Oxygen Therapy: Patient Spontanous Breathing  Post-op Assessment: Report given to RN and Post -op Vital signs reviewed and stable  Post vital signs: Reviewed and stable  Last Vitals:  Vitals Value Taken Time  BP 94/73 11/14/21 1133  Temp    Pulse 92 11/14/21 1134  Resp    SpO2 96 % 11/14/21 1134  Vitals shown include unvalidated device data.  Last Pain:  Vitals:   11/14/21 1023  TempSrc: Temporal  PainSc: 8          Complications: No notable events documented.

## 2021-11-14 NOTE — Anesthesia Preprocedure Evaluation (Signed)
Anesthesia Evaluation  Patient identified by MRN, date of birth, ID band Patient awake    Reviewed: Allergy & Precautions, NPO status , Patient's Chart, lab work & pertinent test results  History of Anesthesia Complications Negative for: history of anesthetic complications  Airway Mallampati: I  TM Distance: >3 FB Neck ROM: Full    Dental  (+) Missing, Chipped,    Pulmonary COPD,  COPD inhaler, Current Smoker and Patient abstained from smoking.   breath sounds clear to auscultation       Cardiovascular negative cardio ROS  Rhythm:Regular     Neuro/Psych negative neurological ROS     GI/Hepatic Neg liver ROS, PUD,GERD  ,,  Endo/Other  negative endocrine ROS    Renal/GU Lab Results      Component                Value               Date                      CREATININE               0.70                11/14/2021           Lab Results      Component                Value               Date                      K                        3.1 (L)             11/14/2021                Musculoskeletal negative musculoskeletal ROS (+)    Abdominal   Peds  Hematology  (+) Blood dyscrasia, anemia Lab Results      Component                Value               Date                      WBC                      12.1 (H)            11/14/2021                HGB                      10.5 (L)            11/14/2021                HCT                      31.3 (L)            11/14/2021                MCV                      93.7  11/14/2021                PLT                      416 (H)             11/14/2021              Anesthesia Other Findings   Reproductive/Obstetrics                             Anesthesia Physical Anesthesia Plan  ASA: 3  Anesthesia Plan: MAC   Post-op Pain Management: Minimal or no pain anticipated   Induction: Intravenous  PONV Risk Score and Plan: 1 and  Propofol infusion and Treatment may vary due to age or medical condition  Airway Management Planned: Nasal Cannula and Natural Airway  Additional Equipment: None  Intra-op Plan:   Post-operative Plan:   Informed Consent: I have reviewed the patients History and Physical, chart, labs and discussed the procedure including the risks, benefits and alternatives for the proposed anesthesia with the patient or authorized representative who has indicated his/her understanding and acceptance.     Dental advisory given  Plan Discussed with: CRNA  Anesthesia Plan Comments:        Anesthesia Quick Evaluation

## 2021-11-14 NOTE — Progress Notes (Signed)
Mobility Specialist - Progress Note   11/14/21 1627  Mobility  Activity Ambulated with assistance in hallway  Level of Assistance Standby assist, set-up cues, supervision of patient - no hands on  Assistive Device None  Distance Ambulated (ft) 280 ft  Activity Response Tolerated well  $Mobility charge 1 Mobility    Pt received in bed agreeable to mobility. Left in bed w/ call bell in reach and all needs met.   Paulla Dolly Mobility Specialist

## 2021-11-14 NOTE — Progress Notes (Signed)
PROGRESS NOTE    Pamela Livingston  UXL:[id number] DOB: 1965/11/17 DOA: 11/13/2021 PCP: Soyla Dryer, PA-C   Brief Narrative:  HPI: Pamela Livingston is a 56 y.o. female with medical history significant of COPD, arthritis, PUD, GERD, obesity, and polysubstance abuse who presents with complaints of abdominal pain.  She reports having right lower quadrant and epigastric abdominal pain for the last 5 days.  Reported associated symptoms of nausea, vomiting, diarrhea, hot/cold flashes, and dark urine.  Emesis was reported to be nonbloody in appearance.  Patient had been taking Pepto-Bismol without improvement in symptoms.  Stools have been reported to be dark black without clear signs of blood present.  Patient notes that she continues to smoke about half pack cigarettes per day on average and chronically wheezes.  Denies any NSAID use, but is on meloxicam and takes it daily for arthritis pain.  Patient had colonoscopy and EGD last 07/26/2020 for positive fecal occult test along with dysphagia/heartburn which had noted nonbleeding internal hemorrhoid, one 4 mm polyp of the descending colon which was removed, small hiatal hernia, mild Slutsky ring which was dilated, and erythematous mucosa in the gastric body.   Upon admission into the patient was noted to be tachypneic and tachycardic with vital signs otherwise maintained.  Labs significant for WBC 15.6, hemoglobin 11.8, potassium 3.2, CO2 17, glucose 94, anion gap 16, and i-STAT hCG level 150.9.  Urinalysis noted large hemoglobin, 80 ketones, rare bacteria, 21-50 RBC/hpf.  CT angiogram of the chest along with CT abdomen/pelvis with contrast had been obtained noting new small right pleural effusion, no PE, and no other acute abdominal findings.  Fecal occult testing was positive.   Patient has been given 1 L of lactated Ringer's, potassium chloride 40 mEq Reglan, DuoNeb breathing treatment, fentanyl for pain, Reglan, and started on a Protonix drip.    Assessment &  Plan:   Principal Problem:   GI bleed Active Problems:   Acute blood loss anemia   Leukocytosis   Nausea, vomiting, and diarrhea   Hematuria   Hypokalemia   Chronic obstructive pulmonary disease (HCC)   Cigarette nicotine dependence with nicotine-induced disorder   History of substance abuse (HCC)   Obesity (BMI 30-39.9)   GIB (gastrointestinal bleeding)  Acute blood loss anemia 2/2 GI bleed: Patient reports having dark black stools, but has been taking Pepto-Bismol.  Stool guaiac positive for blood.  Hemoglobin 11.8 g/dL and is slightly lower than baseline which had been within normal limits at 12.3 on 10/19.  Patient is on meloxicam which she takes daily for arthritis pain for about a year. Last EGD & colonoscopy from 07/2020 noted nonbleeding internal hemorrhoid, one 4 mm polyp of the descending colon which was removed, small hiatal hernia, mild Slutsky ring which was dilated, and erythematous mucosa in the gastric body.  Seen by GI.  Plan for EGD today.  Continue Protonix twice daily.  Monitor H&H frequently.   Leukocytosis: Nonspecific, she is afebrile.  Improving.  Repeat labs in the morning.  Abdominal pain/chest pain: Complains of pain in the right upper quadrant that travels to the right anterior chest.  CTA chest negative for PE, CT abdomen and pelvis with contrast negative for acute pathology as well.  Nausea, vomiting, diarrhea: Patient reports 5-day history of symptoms. Noted to have RLQ abd pain although no clear cause for symptoms appreciated on CT. treat symptomatically.   Hematuria :  Patient denied any complaints of dysuria, but noted dark urine which is consistent with bleeding.  CT scan of the abdomen pelvis did not note any clear cause for patient's symptoms.  Patient had initially been given Rocephin which has not continued after admission and I agree with that since patient is totally asymptomatic.  Will follow urine culture..  Hypokalemia: Low again, will replace.    COPD, without acute exacerbation Patient currently without wheezing on physical exam. -Continue pharmacy substitution for Wixela inhaler -DuoNebs as needed for shortness of breath/wheezing   Essential hypertension Initially elevated up to 157/100.  Home blood pressure regimen includes lisinopril 20 mg daily . -Continue lisinopril.  Blood pressure controlled now.   Elevated i-STAT beta-hCG Acute.  I-STAT beta-hCG elevated at 150.9.  Documented history of history of hysterectomy, but uterus still present on CT scan of the abdomen. patient states that her ovaries were removed and suspect false positive as no other acute abnormality appreciated on the CT.   Tobacco abuse Patient reports smoking half pack of cigarettes per day on average -Counseled on the need of cessation of tobacco use -Nicotine patch offered   Polysubstance abuse Patient admits to marijuana use and has prior history cociane use last documented in 2008   Obesity BMI 33.33 kg/m.  Weight loss and dietary modification counseling provided.   DVT prophylaxis: SCDs Start: 11/13/21 1544   Code Status: Full Code  Family Communication:  None present at bedside.  Plan of care discussed with patient in length and he/she verbalized understanding and agreed with it.  Status is: Inpatient Remains inpatient appropriate because: Scheduled for EGD today.   Estimated body mass index is 33.33 kg/m as calculated from the following:   Height as of this encounter: '4\' 11"'$  (1.499 m).   Weight as of this encounter: 74.8 kg.    Nutritional Assessment: Body mass index is 33.33 kg/m.Marland Kitchen Seen by dietician.  I agree with the assessment and plan as outlined below: Nutrition Status:        . Skin Assessment: I have examined the patient's skin and I agree with the wound assessment as performed by the wound care RN as outlined below:    Consultants:  GI  Procedures:  As above  Antimicrobials:  Anti-infectives (From admission,  onward)    Start     Dose/Rate Route Frequency Ordered Stop   11/13/21 1415  cefTRIAXone (ROCEPHIN) 1 g in sodium chloride 0.9 % 100 mL IVPB        1 g 200 mL/hr over 30 Minutes Intravenous  Once 11/13/21 1402 11/13/21 1513         Subjective: Patient seen and examined.  Complains of abdominal pain and describes that it is located at right upper quadrant and travels all the way to the right anterior chest.  Worsens with deep breathing.  No other complaint.  Objective: Vitals:   11/14/21 0300 11/14/21 0400 11/14/21 0620 11/14/21 0949  BP: 132/74  110/73 128/78  Pulse: 100  87 94  Resp: (!) '24  18 18  '$ Temp:  100.1 F (37.8 C) 98.6 F (37 C) 97.6 F (36.4 C)  TempSrc:  Oral Oral Oral  SpO2: 91%  93% 99%  Weight:      Height:        Intake/Output Summary (Last 24 hours) at 11/14/2021 1011 Last data filed at 11/14/2021 0949 Gross per 24 hour  Intake 1199.09 ml  Output --  Net 1199.09 ml   Filed Weights   11/13/21 0834  Weight: 74.8 kg    Examination:  General exam: Appears calm and comfortable  Respiratory system: Clear to auscultation. Respiratory effort normal. Cardiovascular system: S1 & S2 heard, RRR. No JVD, murmurs, rubs, gallops or clicks. No pedal edema. Gastrointestinal system: Abdomen is nondistended, soft but has mild tenderness at epigastric area. No organomegaly or masses felt. Normal bowel sounds heard. Central nervous system: Alert and oriented. No focal neurological deficits. Extremities: Symmetric 5 x 5 power. Skin: No rashes, lesions or ulcers Psychiatry: Judgement and insight appear normal. Mood & affect appropriate.    Data Reviewed: I have personally reviewed following labs and imaging studies  CBC: Recent Labs  Lab 11/09/21 1200 11/13/21 1043 11/13/21 1714 11/14/21 0344  WBC 13.9* 15.6*  --  12.1*  HGB 12.3 11.8* 11.1* 10.5*  HCT 36.3 33.6* 33.1* 31.3*  MCV 93.3 91.3  --  93.7  PLT 344 412*  --  809*   Basic Metabolic  Panel: Recent Labs  Lab 11/09/21 1200 11/13/21 1043 11/14/21 0344  NA 138 135 134*  K 2.9* 3.2* 3.1*  CL 106 102 104  CO2 20* 17* 19*  GLUCOSE 118* 94 89  BUN '9 9 7  '$ CREATININE 0.76 0.76 0.70  CALCIUM 9.3 8.9 8.5*   GFR: Estimated Creatinine Clearance: 70.8 mL/min (by C-G formula based on SCr of 0.7 mg/dL). Liver Function Tests: Recent Labs  Lab 11/09/21 1435 11/13/21 1043  AST 12* 12*  ALT 9 11  ALKPHOS 76 83  BILITOT 0.6 0.8  PROT 7.0 6.5  ALBUMIN 3.7 2.8*   Recent Labs  Lab 11/09/21 1435 11/13/21 1043  LIPASE 20 20   No results for input(s): "AMMONIA" in the last 168 hours. Coagulation Profile: Recent Labs  Lab 11/13/21 1443  INR 1.1   Cardiac Enzymes: No results for input(s): "CKTOTAL", "CKMB", "CKMBINDEX", "TROPONINI" in the last 168 hours. BNP (last 3 results) No results for input(s): "PROBNP" in the last 8760 hours. HbA1C: No results for input(s): "HGBA1C" in the last 72 hours. CBG: No results for input(s): "GLUCAP" in the last 168 hours. Lipid Profile: No results for input(s): "CHOL", "HDL", "LDLCALC", "TRIG", "CHOLHDL", "LDLDIRECT" in the last 72 hours. Thyroid Function Tests: No results for input(s): "TSH", "T4TOTAL", "FREET4", "T3FREE", "THYROIDAB" in the last 72 hours. Anemia Panel: No results for input(s): "VITAMINB12", "FOLATE", "FERRITIN", "TIBC", "IRON", "RETICCTPCT" in the last 72 hours. Sepsis Labs: Recent Labs  Lab 11/13/21 1424 11/13/21 1714  LATICACIDVEN 1.2 0.9    Recent Results (from the past 240 hour(s))  Resp Panel by RT-PCR (Flu A&B, Covid) Anterior Nasal Swab     Status: None   Collection Time: 11/09/21  2:46 PM   Specimen: Anterior Nasal Swab  Result Value Ref Range Status   SARS Coronavirus 2 by RT PCR NEGATIVE NEGATIVE Final    Comment: (NOTE) SARS-CoV-2 target nucleic acids are NOT DETECTED.  The SARS-CoV-2 RNA is generally detectable in upper respiratory specimens during the acute phase of infection. The  lowest concentration of SARS-CoV-2 viral copies this assay can detect is 138 copies/mL. A negative result does not preclude SARS-Cov-2 infection and should not be used as the sole basis for treatment or other patient management decisions. A negative result may occur with  improper specimen collection/handling, submission of specimen other than nasopharyngeal swab, presence of viral mutation(s) within the areas targeted by this assay, and inadequate number of viral copies(<138 copies/mL). A negative result must be combined with clinical observations, patient history, and epidemiological information. The expected result is Negative.  Fact Sheet for Patients:  EntrepreneurPulse.com.au  Fact Sheet for Healthcare Providers:  IncredibleEmployment.be  This test is no t yet approved or cleared by the [place] and  has been authorized for detection and/or diagnosis of SARS-CoV-2 by FDA under an Emergency Use Authorization (EUA). This EUA will remain  in effect (meaning this test can be used) for the duration of the COVID-19 declaration under Section 564(b)(1) of the Act, 21 U.S.C.section 360bbb-3(b)(1), unless the authorization is terminated  or revoked sooner.       Influenza A by PCR NEGATIVE NEGATIVE Final   Influenza B by PCR NEGATIVE NEGATIVE Final    Comment: (NOTE) The Xpert Xpress SARS-CoV-2/FLU/RSV plus assay is intended as an aid in the diagnosis of influenza from Nasopharyngeal swab specimens and should not be used as a sole basis for treatment. Nasal washings and aspirates are unacceptable for Xpert Xpress SARS-CoV-2/FLU/RSV testing.  Fact Sheet for Patients: EntrepreneurPulse.com.au  Fact Sheet for Healthcare Providers: IncredibleEmployment.be  This test is not yet approved or cleared by the [place] FDA and has been authorized for detection and/or diagnosis of SARS-CoV-2 by FDA under  an Emergency Use Authorization (EUA). This EUA will remain in effect (meaning this test can be used) for the duration of the COVID-19 declaration under Section 564(b)(1) of the Act, 21 U.S.C. section 360bbb-3(b)(1), unless the authorization is terminated or revoked.  Performed at [facility], [address] [place], [place] [address]   Resp Panel by RT-PCR (Flu A&B, Covid) Anterior Nasal Swab     Status: None   Collection Time: 11/13/21  9:26 AM   Specimen: Anterior Nasal Swab  Result Value Ref Range Status   SARS Coronavirus 2 by RT PCR NEGATIVE NEGATIVE Final    Comment: (NOTE) SARS-CoV-2 target nucleic acids are NOT DETECTED.  The SARS-CoV-2 RNA is generally detectable in upper respiratory specimens during the acute phase of infection. The lowest concentration of SARS-CoV-2 viral copies this assay can detect is 138 copies/mL. A negative result does not preclude SARS-Cov-2 infection and should not be used as the sole basis for treatment or other patient management decisions. A negative result may occur with  improper specimen collection/handling, submission of specimen other than nasopharyngeal swab, presence of viral mutation(s) within the areas targeted by this assay, and inadequate number of viral copies(<138 copies/mL). A negative result must be combined with clinical observations, patient history, and epidemiological information. The expected result is Negative.  Fact Sheet for Patients:  EntrepreneurPulse.com.au  Fact Sheet for Healthcare Providers:  IncredibleEmployment.be  This test is no t yet approved or cleared by the [place] FDA and  has been authorized for detection and/or diagnosis of SARS-CoV-2 by FDA under an Emergency Use Authorization (EUA). This EUA will remain  in effect (meaning this test can be used) for the duration of the COVID-19 declaration under Section 564(b)(1) of the Act, 21 U.S.C.section 360bbb-3(b)(1),  unless the authorization is terminated  or revoked sooner.       Influenza A by PCR NEGATIVE NEGATIVE Final   Influenza B by PCR NEGATIVE NEGATIVE Final    Comment: (NOTE) The Xpert Xpress SARS-CoV-2/FLU/RSV plus assay is intended as an aid in the diagnosis of influenza from Nasopharyngeal swab specimens and should not be used as a sole basis for treatment. Nasal washings and aspirates are unacceptable for Xpert Xpress SARS-CoV-2/FLU/RSV testing.  Fact Sheet for Patients: EntrepreneurPulse.com.au  Fact Sheet for Healthcare Providers: IncredibleEmployment.be  This test is not yet approved or cleared by the [place] FDA and has been authorized for detection and/or diagnosis of SARS-CoV-2  by FDA under an Emergency Use Authorization (EUA). This EUA will remain in effect (meaning this test can be used) for the duration of the COVID-19 declaration under Section 564(b)(1) of the Act, 21 U.S.C. section 360bbb-3(b)(1), unless the authorization is terminated or revoked.  Performed at [facility], [place] [address], [place], [place] [address]      Radiology Studies: CT ABDOMEN PELVIS W CONTRAST  Result Date: 11/13/2021 CLINICAL DATA:  Abdominal pain EXAM: CT ABDOMEN AND PELVIS WITH CONTRAST TECHNIQUE: Multidetector CT imaging of the abdomen and pelvis was performed using the standard protocol following bolus administration of intravenous contrast. RADIATION DOSE REDUCTION: This exam was performed according to the departmental dose-optimization program which includes automated exposure control, adjustment of the mA and/or kV according to patient size and/or use of iterative reconstruction technique. CONTRAST:  62m OMNIPAQUE IOHEXOL 350 MG/ML SOLN COMPARISON:  None Available. FINDINGS: Lower chest: Small right pleural effusion with associated passive atelectasis. Hepatobiliary: Hepatic steatosis. Liver has a contour. No focal liver lesions are  seen. Portal veins are contrast opacified. Gallbladder is surgically absent. No evidence of intra or extrahepatic biliary duct dilatation. Pancreas: No evidence of Peri pancreatic fat stranding. No evidence of pancreatic ductal dilatation. Spleen: Spleen is normal in size. No evidence of focal lesion or perisplenic fluid. Adrenals/Urinary Tract: Bilateral adrenal glands are normal in appearance. Evidence of hydronephrosis or nephrolithiasis. No focal renal lesions are visualized. There are nonspecific calcifications along the course of the right ureter, possibly related to prior embolization procedure. The urinary bladder is fluid-filled without evidence of focal wall thickening. Stomach/Bowel: The stomach, small bowel, large bowel are normal in caliber without evidence of bowel obstruction. There is diverticulosis without definitive evidence of diverticulitis. Status post appendectomy. Vascular/Lymphatic: No significant vascular findings are present. No enlarged abdominal or pelvic lymph nodes. Reproductive: Uterus and bilateral adnexa are unremarkable. Other: No abdominal wall hernia or abnormality. No abdominopelvic ascites. Musculoskeletal: No acute or significant osseous findings. IMPRESSION: No finding below the diaphragm to explain abdominal pain. See separately dictated CT of the chest. Electronically Signed   By: HMarin RobertsM.D.   On: 11/13/2021 13:47   CT Angio Chest PE W and/or Wo Contrast  Result Date: 11/13/2021 CLINICAL DATA:  Right sided pain, emesis EXAM: CT ANGIOGRAPHY CHEST WITH CONTRAST TECHNIQUE: Multidetector CT imaging of the chest was performed using the standard protocol during bolus administration of intravenous contrast. Multiplanar CT image reconstructions and MIPs were obtained to evaluate the vascular anatomy. RADIATION DOSE REDUCTION: This exam was performed according to the departmental dose-optimization program which includes automated exposure control, adjustment of the mA  and/or kV according to patient size and/or use of iterative reconstruction technique. CONTRAST:  853mOMNIPAQUE IOHEXOL 350 MG/ML SOLN COMPARISON:  12/12/2019 FINDINGS: Cardiovascular: Heart size normal. No pericardial effusion. The RV is nondilated. Scattered coronary calcifications. Adequate contrast opacification of the thoracic aorta with no evidence of dissection, aneurysm, or stenosis. There is classic 3-vessel brachiocephalic arch anatomy without proximal stenosis. Scattered partially calcified plaque in the descending thoracic aorta. Mediastinum/Nodes: No mass or adenopathy. Lungs/Pleura: New small right pleural effusion. No pneumothorax. Dependent atelectasis at the right lung base. Platelike linear scarring or subsegmental atelectasis in the left lower lobe as before. Upper Abdomen: No acute findings. Musculoskeletal: Mild spondylitic changes in the lower cervical, mid and lower thoracic spine. No acute findings. Review of the MIP images confirms the above findings. IMPRESSION: 1. Negative for acute PE or thoracic aortic dissection. 2. New small right pleural  effusion. 3. Coronary and  aortic Atherosclerosis (ICD10-I70.0). Electronically Signed   By: Lucrezia Europe M.D.   On: 11/13/2021 13:31    Scheduled Meds:  atorvastatin  20 mg Oral Daily   diphenhydrAMINE  50 mg Oral QHS   guaiFENesin  600 mg Oral Daily   lisinopril  20 mg Oral Daily   mometasone-formoterol  2 puff Inhalation BID   montelukast  10 mg Oral QHS   nicotine  14 mg Transdermal Daily   pantoprazole (PROTONIX) IV  40 mg Intravenous Q12H   sodium chloride flush  3 mL Intravenous Q12H   Continuous Infusions:  potassium chloride 10 mEq (11/14/21 0908)     LOS: 0 days   Darliss Cheney, MD [facility]  11/14/2021, 10:11 AM   *Please note that this is a verbal dictation therefore any spelling or grammatical errors are due to the "[place]" system interpretation.  Please page via [place] and do not message via  secure chat for urgent patient care matters. Secure chat can be used for non urgent patient care matters.  How to contact the [facility] Attending or Consulting provider [place] or covering provider during after hours [place], for this patient?  Check the care team in [facility] and look for a) attending/consulting [facility] provider listed and b) the [facility] team listed. Page or secure chat [phone]. Log into [email or web address] and use [place]'s universal password to access. If you do not have the password, please contact the hospital operator. Locate the [facility] provider you are looking for under [facility] and page to a number that you can be directly reached. If you still have difficulty reaching the provider, please page the [facility] (Director on Call) for the Hospitalists listed on amion for assistance.

## 2021-11-14 NOTE — ED Notes (Signed)
ED TO INPATIENT HANDOFF REPORT  ED Nurse Name and Phone #:   S Name/Age/Gender Pamela Livingston 56 y.o. female Room/Bed: 005C/005C  Code Status   Code Status: Full Code  Home/SNF/Other Home Patient oriented to: self, place, time, and situation Is this baseline? Yes   Triage Complete: Triage complete  Chief Complaint GI bleed [K92.2]  Triage Note Complains of right side pain since last Wednesday. Reports she has been throwing up since last Wednesday.  Reports having black diarrhea.  Denies blood thinners.     Allergies Allergies  Allergen Reactions   Asa [Aspirin] Anaphylaxis, Hives and Swelling   Codeine Nausea Only    Level of Care/Admitting Diagnosis ED Disposition     ED Disposition  Admit   Condition  --   Comment  Hospital Area: [facility] [[id number]]  Level of Care: Telemetry Medical [104]  May place patient in observation at [facility] or [facility] if equivalent level of care is available:: No  Covid Evaluation: Asymptomatic - no recent exposure (last 10 days) testing not required  Diagnosis: GI bleed [045409]  Admitting Physician: Norval Morton [[id number]]  Attending Physician: Norval Morton [[id number]]          B Medical/Surgery History Past Medical History:  Diagnosis Date   Asthma    Chronic pelvic pain in female    COPD (chronic obstructive pulmonary disease) ([place])    Endometriosis    Polysubstance abuse ([place])    marijuana, cocaine, benzos   Stomach ulcer    Suicide attempt ([place]) 2008   by phenergan overdose   Syncope    Past Surgical History:  Procedure Laterality Date   ABDOMINAL HYSTERECTOMY     BALLOON DILATION N/A 07/26/2020   Procedure: BALLOON DILATION;  Surgeon: Eloise Harman, DO;  [facility];  Service: Endoscopy;  Laterality: N/A;   BIOPSY  07/26/2020   Procedure: BIOPSY;  Surgeon: Eloise Harman, DO;  [facility];  Service: Endoscopy;;   CERVICAL CONE BIOPSY     cervical cancer    CESAREAN SECTION  1993   CHOLECYSTECTOMY     COLONOSCOPY WITH PROPOFOL N/A 07/26/2020   Procedure: COLONOSCOPY WITH PROPOFOL;  Surgeon: Eloise Harman, DO;  [facility];  Service: Endoscopy;  Laterality: N/A;  12:00pm   ESOPHAGOGASTRODUODENOSCOPY (EGD) WITH PROPOFOL N/A 07/26/2020   Procedure: ESOPHAGOGASTRODUODENOSCOPY (EGD) WITH PROPOFOL;  Surgeon: Eloise Harman, DO;  [facility];  Service: Endoscopy;  Laterality: N/A;   RADICAL HYSTERECTOMY WITH TRANSPOSITION OF OVARIES     TOOTH EXTRACTION     TUBAL LIGATION  1993     A IV Location/Drains/Wounds Patient Lines/Drains/Airways Status     Active Line/Drains/Airways     Name Placement date Placement time Site Days   Peripheral IV 11/13/21 20 G Left Antecubital 11/13/21  1214  Antecubital  1            Intake/Output Last 24 hours  Intake/Output Summary (Last 24 hours) at 11/14/2021 0720 Last data filed at 11/13/2021 1605 Gross per 24 hour  Intake 1199.09 ml  Output --  Net 1199.09 ml    Labs/Imaging Results for orders placed or performed during the hospital encounter of 11/13/21 (from the past 48 hour(s))  Urinalysis, Routine w reflex microscopic     Status: Abnormal   Collection Time: 11/13/21  8:37 AM  Result Value Ref Range   Color, Urine YELLOW YELLOW   APPearance CLEAR CLEAR  Specific Gravity, Urine >1.030 (H) 1.005 - 1.030   pH 5.5 5.0 - 8.0   Glucose, UA NEGATIVE NEGATIVE mg/dL   Hgb urine dipstick LARGE (A) NEGATIVE   Bilirubin Urine MODERATE (A) NEGATIVE   Ketones, ur >80 (A) NEGATIVE mg/dL   Protein, ur 100 (A) NEGATIVE mg/dL   Nitrite NEGATIVE NEGATIVE   Leukocytes,Ua NEGATIVE NEGATIVE    Comment: Performed at [facility] [address], [place], [place] [address]  Urinalysis, Microscopic (reflex)     Status: Abnormal   Collection Time: 11/13/21  8:37 AM  Result Value Ref Range   RBC / HPF 21-50 0 - 5 RBC/hpf   WBC, UA 0-5 0 - 5 WBC/hpf   Bacteria, UA RARE (A)  NONE SEEN   Squamous Epithelial / LPF 0-5 0 - 5    Comment: Performed at [facility], [place] [address] [place], [place] [address]  Resp Panel by RT-PCR (Flu A&B, Covid) Anterior Nasal Swab     Status: None   Collection Time: 11/13/21  9:26 AM   Specimen: Anterior Nasal Swab  Result Value Ref Range   SARS Coronavirus 2 by RT PCR NEGATIVE NEGATIVE    Comment: (NOTE) SARS-CoV-2 target nucleic acids are NOT DETECTED.  The SARS-CoV-2 RNA is generally detectable in upper respiratory specimens during the acute phase of infection. The lowest concentration of SARS-CoV-2 viral copies this assay can detect is 138 copies/mL. A negative result does not preclude SARS-Cov-2 infection and should not be used as the sole basis for treatment or other patient management decisions. A negative result may occur with  improper specimen collection/handling, submission of specimen other than nasopharyngeal swab, presence of viral mutation(s) within the areas targeted by this assay, and inadequate number of viral copies(<138 copies/mL). A negative result must be combined with clinical observations, patient history, and epidemiological information. The expected result is Negative.  Fact Sheet for Patients:  EntrepreneurPulse.com.au  Fact Sheet for Healthcare Providers:  IncredibleEmployment.be  This test is no t yet approved or cleared by the [place] FDA and  has been authorized for detection and/or diagnosis of SARS-CoV-2 by FDA under an Emergency Use Authorization (EUA). This EUA will remain  in effect (meaning this test can be used) for the duration of the COVID-19 declaration under Section 564(b)(1) of the Act, 21 U.S.C.section 360bbb-3(b)(1), unless the authorization is terminated  or revoked sooner.       Influenza A by PCR NEGATIVE NEGATIVE   Influenza B by PCR NEGATIVE NEGATIVE    Comment: (NOTE) The Xpert Xpress SARS-CoV-2/FLU/RSV plus assay is  intended as an aid in the diagnosis of influenza from Nasopharyngeal swab specimens and should not be used as a sole basis for treatment. Nasal washings and aspirates are unacceptable for Xpert Xpress SARS-CoV-2/FLU/RSV testing.  Fact Sheet for Patients: EntrepreneurPulse.com.au  Fact Sheet for Healthcare Providers: IncredibleEmployment.be  This test is not yet approved or cleared by the [place] FDA and has been authorized for detection and/or diagnosis of SARS-CoV-2 by FDA under an Emergency Use Authorization (EUA). This EUA will remain in effect (meaning this test can be used) for the duration of the COVID-19 declaration under Section 564(b)(1) of the Act, 21 U.S.C. section 360bbb-3(b)(1), unless the authorization is terminated or revoked.  Performed at [facility], [place] [address], [place], [place] [address]   Comprehensive metabolic panel     Status: Abnormal   Collection Time: 11/13/21 10:43 AM  Result Value Ref Range  Sodium 135 135 - 145 mmol/L   Potassium 3.2 (L) 3.5 - 5.1 mmol/L   Chloride 102 98 - 111 mmol/L   CO2 17 (L) 22 - 32 mmol/L   Glucose, Bld 94 70 - 99 mg/dL    Comment: Glucose reference range applies only to samples taken after fasting for at least 8 hours.   BUN 9 6 - 20 mg/dL   Creatinine, Ser 0.76 0.44 - 1.00 mg/dL   Calcium 8.9 8.9 - 10.3 mg/dL   Total Protein 6.5 6.5 - 8.1 g/dL   Albumin 2.8 (L) 3.5 - 5.0 g/dL   AST 12 (L) 15 - 41 U/L   ALT 11 0 - 44 U/L   Alkaline Phosphatase 83 38 - 126 U/L   Total Bilirubin 0.8 0.3 - 1.2 mg/dL   GFR, Estimated >60 >60 mL/min    Comment: (NOTE) Calculated using the CKD-EPI Creatinine Equation (2021)    Anion gap 16 (H) 5 - 15    Comment: Performed at [facility], [place] [address] [place], [place] [address]  CBC     Status: Abnormal   Collection Time: 11/13/21 10:43 AM  Result Value Ref Range   WBC 15.6 (H) 4.0 - 10.5 K/uL   RBC 3.68 (L) 3.87 - 5.11  MIL/uL   Hemoglobin 11.8 (L) 12.0 - 15.0 g/dL   HCT 33.6 (L) 36.0 - 46.0 %   MCV 91.3 80.0 - 100.0 fL   MCH 32.1 26.0 - 34.0 pg   MCHC 35.1 30.0 - 36.0 g/dL   RDW 12.5 11.5 - 15.5 %   Platelets 412 (H) 150 - 400 K/uL   nRBC 0.0 0.0 - 0.2 %    Comment: Performed at [facility], [place] [address] [place], [place] [address]  I-Stat beta hCG blood, ED     Status: Abnormal   Collection Time: 11/13/21 10:43 AM  Result Value Ref Range   I-stat hCG, quantitative 150.9 (H) <5 mIU/mL   Comment 3            Comment:   GEST. AGE      CONC.  (mIU/mL)   <=1 WEEK        5 - 50     2 WEEKS       50 - 500     3 WEEKS       100 - 10,000     4 WEEKS     1,000 - 30,000        FEMALE AND NON-PREGNANT FEMALE:     LESS THAN 5 mIU/mL   Lipase, blood     Status: None   Collection Time: 11/13/21 10:43 AM  Result Value Ref Range   Lipase 20 11 - 51 U/L    Comment: Performed at [facility], [place] [address], [place], [place] [address]  Type and screen [place]     Status: None   Collection Time: 11/13/21 10:45 AM  Result Value Ref Range   ABO/RH(D) O POS    Antibody Screen NEG    Sample Expiration      11/16/2021,2359 Performed at [facility], [place] [address] [place], [place] [address]   POC occult blood, ED     Status: Abnormal   Collection Time: 11/13/21  2:15 PM  Result Value Ref Range   Fecal Occult Bld POSITIVE (A) NEGATIVE  Lactic acid, plasma     Status: None   Collection Time: 11/13/21  2:24 PM  Result Value Ref Range   Lactic Acid, Venous 1.2 0.5 - 1.9 mmol/L    Comment: Performed at [facility] [address] [place], [place] [address]  Protime-INR     Status: None   Collection Time: 11/13/21  2:43 PM  Result Value Ref Range   Prothrombin Time 14.0 11.4 - 15.2 seconds   INR 1.1 0.8 - 1.2    Comment: (NOTE) INR goal varies based on device and disease states. Performed at [facility], [place] [address] [place], [place] [address]    Lactic acid, plasma     Status: None   Collection Time: 11/13/21  5:14 PM  Result Value Ref Range   Lactic Acid, Venous 0.9 0.5 - 1.9 mmol/L    Comment: Performed at [facility] [address] [place], [place] [address]  HIV Antibody (routine testing w rflx)     Status: None   Collection Time: 11/13/21  5:14 PM  Result Value Ref Range   HIV Screen 4th Generation wRfx Non Reactive Non Reactive    Comment: Performed at [facility], [place] [address] [place], [place] [address]  Hemoglobin and hematocrit, blood     Status: Abnormal   Collection Time: 11/13/21  5:14 PM  Result Value Ref Range   Hemoglobin 11.1 (L) 12.0 - 15.0 g/dL   HCT 33.1 (L) 36.0 - 46.0 %    Comment: Performed at [facility] [address], [place], [place] [address]  Rapid urine drug screen (hospital performed)     Status: Abnormal   Collection Time: 11/13/21  6:50 PM  Result Value Ref Range   Opiates NONE DETECTED NONE DETECTED   Cocaine NONE DETECTED NONE DETECTED   Benzodiazepines NONE DETECTED NONE DETECTED   Amphetamines NONE DETECTED NONE DETECTED   Tetrahydrocannabinol POSITIVE (A) NONE DETECTED   Barbiturates NONE DETECTED NONE DETECTED    Comment: (NOTE) DRUG SCREEN FOR MEDICAL PURPOSES ONLY.  IF CONFIRMATION IS NEEDED FOR ANY PURPOSE, NOTIFY LAB WITHIN 5 DAYS.  LOWEST DETECTABLE LIMITS FOR URINE DRUG SCREEN Drug Class                     Cutoff (ng/mL) Amphetamine and metabolites    1000 Barbiturate and metabolites    200 Benzodiazepine                 200 Opiates and metabolites        300 Cocaine and metabolites        300 THC                            50 Performed at [facility], [place] [address], [place], [place] [address]   Pregnancy, urine     Status: None   Collection Time: 11/13/21  6:50 PM  Result Value Ref Range   Preg Test, Ur NEGATIVE NEGATIVE    Comment: Performed at [facility] [address], [place] [address]  CBC     Status:  Abnormal   Collection Time: 11/14/21  3:44 AM  Result Value Ref Range   WBC 12.1 (H) 4.0 - 10.5 K/uL   RBC 3.34 (L) 3.87 - 5.11 MIL/uL   Hemoglobin 10.5 (L) 12.0 - 15.0 g/dL   HCT 31.3 (L) 36.0 - 46.0 %   MCV 93.7 80.0 - 100.0 fL   MCH 31.4  26.0 - 34.0 pg   MCHC 33.5 30.0 - 36.0 g/dL   RDW 12.8 11.5 - 15.5 %   Platelets 416 (H) 150 - 400 K/uL   nRBC 0.0 0.0 - 0.2 %    Comment: Performed at [facility], [place] [address], [place], [place] [address]  Basic metabolic panel     Status: Abnormal   Collection Time: 11/14/21  3:44 AM  Result Value Ref Range   Sodium 134 (L) 135 - 145 mmol/L   Potassium 3.1 (L) 3.5 - 5.1 mmol/L   Chloride 104 98 - 111 mmol/L   CO2 19 (L) 22 - 32 mmol/L   Glucose, Bld 89 70 - 99 mg/dL    Comment: Glucose reference range applies only to samples taken after fasting for at least 8 hours.   BUN 7 6 - 20 mg/dL   Creatinine, Ser 0.70 0.44 - 1.00 mg/dL   Calcium 8.5 (L) 8.9 - 10.3 mg/dL   GFR, Estimated >60 >60 mL/min    Comment: (NOTE) Calculated using the CKD-EPI Creatinine Equation (2021)    Anion gap 11 5 - 15    Comment: Performed at [facility] [address] [place], [place] [address]   CT ABDOMEN PELVIS W CONTRAST  Result Date: 11/13/2021 CLINICAL DATA:  Abdominal pain EXAM: CT ABDOMEN AND PELVIS WITH CONTRAST TECHNIQUE: Multidetector CT imaging of the abdomen and pelvis was performed using the standard protocol following bolus administration of intravenous contrast. RADIATION DOSE REDUCTION: This exam was performed according to the departmental dose-optimization program which includes automated exposure control, adjustment of the mA and/or kV according to patient size and/or use of iterative reconstruction technique. CONTRAST:  61m OMNIPAQUE IOHEXOL 350 MG/ML SOLN COMPARISON:  None Available. FINDINGS: Lower chest: Small right pleural effusion with associated passive atelectasis. Hepatobiliary: Hepatic steatosis. Liver has a contour. No  focal liver lesions are seen. Portal veins are contrast opacified. Gallbladder is surgically absent. No evidence of intra or extrahepatic biliary duct dilatation. Pancreas: No evidence of Peri pancreatic fat stranding. No evidence of pancreatic ductal dilatation. Spleen: Spleen is normal in size. No evidence of focal lesion or perisplenic fluid. Adrenals/Urinary Tract: Bilateral adrenal glands are normal in appearance. Evidence of hydronephrosis or nephrolithiasis. No focal renal lesions are visualized. There are nonspecific calcifications along the course of the right ureter, possibly related to prior embolization procedure. The urinary bladder is fluid-filled without evidence of focal wall thickening. Stomach/Bowel: The stomach, small bowel, large bowel are normal in caliber without evidence of bowel obstruction. There is diverticulosis without definitive evidence of diverticulitis. Status post appendectomy. Vascular/Lymphatic: No significant vascular findings are present. No enlarged abdominal or pelvic lymph nodes. Reproductive: Uterus and bilateral adnexa are unremarkable. Other: No abdominal wall hernia or abnormality. No abdominopelvic ascites. Musculoskeletal: No acute or significant osseous findings. IMPRESSION: No finding below the diaphragm to explain abdominal pain. See separately dictated CT of the chest. Electronically Signed   By: HMarin RobertsM.D.   On: 11/13/2021 13:47   CT Angio Chest PE W and/or Wo Contrast  Result Date: 11/13/2021 CLINICAL DATA:  Right sided pain, emesis EXAM: CT ANGIOGRAPHY CHEST WITH CONTRAST TECHNIQUE: Multidetector CT imaging of the chest was performed using the standard protocol during bolus administration of intravenous contrast. Multiplanar CT image reconstructions and MIPs were obtained to evaluate the vascular anatomy. RADIATION DOSE REDUCTION: This exam was performed according to the departmental dose-optimization program which includes automated exposure control,  adjustment of the mA and/or kV  according to patient size and/or use of iterative reconstruction technique. CONTRAST:  36m OMNIPAQUE IOHEXOL 350 MG/ML SOLN COMPARISON:  12/12/2019 FINDINGS: Cardiovascular: Heart size normal. No pericardial effusion. The RV is nondilated. Scattered coronary calcifications. Adequate contrast opacification of the thoracic aorta with no evidence of dissection, aneurysm, or stenosis. There is classic 3-vessel brachiocephalic arch anatomy without proximal stenosis. Scattered partially calcified plaque in the descending thoracic aorta. Mediastinum/Nodes: No mass or adenopathy. Lungs/Pleura: New small right pleural effusion. No pneumothorax. Dependent atelectasis at the right lung base. Platelike linear scarring or subsegmental atelectasis in the left lower lobe as before. Upper Abdomen: No acute findings. Musculoskeletal: Mild spondylitic changes in the lower cervical, mid and lower thoracic spine. No acute findings. Review of the MIP images confirms the above findings. IMPRESSION: 1. Negative for acute PE or thoracic aortic dissection. 2. New small right pleural effusion. 3. Coronary and  aortic Atherosclerosis (ICD10-I70.0). Electronically Signed   By: DLucrezia EuropeM.D.   On: 11/13/2021 13:31    Pending Labs Unresulted Labs (From admission, onward)     Start     Ordered   11/13/21 1856  hCG, quantitative, pregnancy  Add-on,   AD        11/13/21 1855   11/13/21 1550  Hemoglobin and hematocrit, blood  Now then every 6 hours,   R      11/13/21 1549   11/13/21 1549  Brain natriuretic peptide  Add-on,   AD        11/13/21 1548   11/13/21 1532  Urine Culture  (Urine Culture)  Once,   R       Question:  Indication  Answer:  Acute gross hematuria   11/13/21 1532            Vitals/Pain Today's Vitals   11/14/21 0300 11/14/21 0353 11/14/21 0400 11/14/21 0620  BP: 132/74   110/73  Pulse: 100   87  Resp: (!) 24   18  Temp:   100.1 F (37.8 C) 98.6 F (37 C)  TempSrc:    Oral Oral  SpO2: 91%   93%  Weight:      Height:      PainSc:  8       Isolation Precautions No active isolations  Medications Medications  nicotine (NICODERM CQ - dosed in mg/24 hours) patch 14 mg (14 mg Transdermal Patch Applied 11/13/21 1842)  atorvastatin (LIPITOR) tablet 20 mg (20 mg Oral Given 11/13/21 1841)  lisinopril (ZESTRIL) tablet 20 mg (has no administration in time range)  mometasone-formoterol (DULERA) 100-5 MCG/ACT inhaler 2 puff (2 puffs Inhalation Given 11/13/21 2320)  montelukast (SINGULAIR) tablet 10 mg (has no administration in time range)  guaiFENesin (MUCINEX) 12 hr tablet 600 mg (has no administration in time range)  diphenhydrAMINE (BENADRYL) capsule 50 mg (50 mg Oral Given 11/14/21 0008)  sodium chloride flush (NS) 0.9 % injection 3 mL (3 mLs Intravenous Not Given 11/14/21 0011)  acetaminophen (TYLENOL) tablet 650 mg (650 mg Oral Given 11/14/21 0417)    Or  acetaminophen (TYLENOL) suppository 650 mg ( Rectal See Alternative 11/14/21 0417)  ondansetron (ZOFRAN) tablet 4 mg ( Oral See Alternative 11/14/21 0004)    Or  ondansetron (ZOFRAN) injection 4 mg (4 mg Intravenous Given 11/14/21 0004)  pantoprazole (PROTONIX) injection 40 mg (40 mg Intravenous Given 11/14/21 0007)  ipratropium-albuterol (DUONEB) 0.5-2.5 (3) MG/3ML nebulizer solution 3 mL (3 mLs Nebulization Given 11/13/21 1845)  HYDROcodone-acetaminophen (NORCO/VICODIN) 5-325 MG per tablet 1 tablet (1 tablet Oral Given  11/13/21 1841)  morphine (PF) 2 MG/ML injection 2 mg (2 mg Intravenous Given 11/14/21 0418)  potassium chloride 10 mEq in 100 mL IVPB (10 mEq Intravenous New Bag/Given 11/14/21 0629)  ondansetron (ZOFRAN) injection 4 mg (4 mg Intravenous Given 11/13/21 1214)  lactated ringers bolus 1,000 mL (0 mLs Intravenous Stopped 11/13/21 1532)  fentaNYL (SUBLIMAZE) injection 50 mcg (50 mcg Intravenous Given 11/13/21 1214)  ipratropium-albuterol (DUONEB) 0.5-2.5 (3) MG/3ML nebulizer solution 3 mL (3  mLs Nebulization Given 11/13/21 1215)  potassium chloride SA (KLOR-CON M) CR tablet 40 mEq (40 mEq Oral Given 11/13/21 1319)  iohexol (OMNIPAQUE) 350 MG/ML injection 80 mL (80 mLs Intravenous Contrast Given 11/13/21 1321)  cefTRIAXone (ROCEPHIN) 1 g in sodium chloride 0.9 % 100 mL IVPB (0 g Intravenous Stopped 11/13/21 1513)  pantoprazole (PROTONIX) 80 mg /NS 100 mL IVPB (0 mg Intravenous Stopped 11/13/21 1547)  metoCLOPramide (REGLAN) injection 10 mg (10 mg Intravenous Given 11/13/21 1531)  diphenhydrAMINE (BENADRYL) injection 12.5 mg (12.5 mg Intravenous Given 11/13/21 1531)    Mobility walks Low fall risk   Focused Assessments     R Recommendations: See Admitting Provider Note  Report given to:   Additional Notes:

## 2021-11-14 NOTE — Op Note (Signed)
[facility] Patient Name: Pamela Livingston Procedure Date : 11/14/2021 MRN: [id number] Attending MD: Gatha Mayer , MD Date of Birth: [birth year] CSN: [id number] Age: 56 Admit Type: Inpatient Procedure:                Upper GI endoscopy Indications:              Heme positive stool, Persistent vomiting Providers:                Gatha Mayer, MD, Velva Harman, RN, Benetta Spar, Technician Referring MD:             [facility] Medicines:                Monitored Anesthesia Care Complications:            No immediate complications. Estimated Blood Loss:     Estimated blood loss was minimal. Procedure:                Pre-Anesthesia Assessment:                           - Prior to the procedure, a History and Physical                            was performed, and patient medications and                            allergies were reviewed. The patient's tolerance of                            previous anesthesia was also reviewed. The risks                            and benefits of the procedure and the sedation                            options and risks were discussed with the patient.                            All questions were answered, and informed consent                            was obtained. Prior Anticoagulants: The patient has                            taken no previous anticoagulant or antiplatelet                            agents. ASA Grade Assessment: III - A patient with                            severe systemic disease. After reviewing the risks  and benefits, the patient was deemed in                            satisfactory condition to undergo the procedure.                           After obtaining informed consent, the endoscope was                            passed under direct vision. Throughout the                            procedure, the patient's blood pressure, pulse, and                             oxygen saturations were monitored continuously. The                            GIF-H190 ([id number] Olympus endoscope was introduced                            through the mouth, and advanced to the second part                            of duodenum. The upper GI endoscopy was                            accomplished without difficulty. The patient                            tolerated the procedure well. Scope In: Scope Out: Findings:      Multiple diminutive sessile polyps were found in the gastric fundus and       in the gastric body. Biopsies were taken with a cold forceps for       histology. Verification of patient identification for the specimen was       done. Estimated blood loss was minimal.      Patchy mildly erythematous mucosa without bleeding was found in the       gastric body and in the gastric antrum.      The gastroesophageal flap valve was visualized endoscopically and       classified as Hill Grade III (minimal fold, loose to endoscope, hiatal       hernia likely).      The exam was otherwise without abnormality.      The cardia and gastric fundus were otherwise normal on retroflexion. Impression:               - Multiple gastric polyps. Biopsied.                           - Erythematous mucosa in the gastric body and                            antrum.                           -  Gastroesophageal flap valve classified as Hill                            Grade III (minimal fold, loose to endoscope, hiatal                            hernia likely).                           - The examination was otherwise normal. There is                            nothing to suggest upper GI bleed - do suspect                            black stools were from Pepto Bismol - and heme +                            likely anorectal given what we know. Doubt a                            primary GI process is going on - ? if she is slow                            to recover  from viral gastroenteritis. Recommendation:           - Return patient to hospital ward for ongoing care.                           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 Abbey Chatters in                            [place] Procedure Code(s):        --- Professional ---  68127, Esophagogastroduodenoscopy, flexible,                            transoral; with biopsy, single or multiple Diagnosis Code(s):        --- Professional ---                           K31.7, Polyp of stomach and duodenum                           K31.89, Other diseases of stomach and duodenum                           R19.5, Other fecal abnormalities                           N17.00, Cyclical vomiting syndrome unrelated to                            migraine CPT copyright 2019 [employer]. All rights reserved. The codes documented in this report are preliminary and upon coder review may  be revised to meet current compliance requirements. Gatha Mayer, MD 11/14/2021 11:38:20 AM This report has been signed electronically. Number of Addenda: 0

## 2021-11-14 NOTE — Interval H&P Note (Signed)
History and Physical Interval Note:  11/14/2021 11:05 AM  Pamela Livingston  has presented today for surgery, with the diagnosis of vomiting, black and heme + stools.  The various methods of treatment have been discussed with the patient and family. After consideration of risks, benefits and other options for treatment, the patient has consented to  Procedure(s): ESOPHAGOGASTRODUODENOSCOPY (EGD) WITH PROPOFOL (N/A) as a surgical intervention.  The patient's history has been reviewed, patient examined, no change in status, stable for surgery.  I have reviewed the patient's chart and labs.  Questions were answered to the patient's satisfaction.     Silvano Rusk

## 2021-11-15 DIAGNOSIS — R1115 Cyclical vomiting syndrome unrelated to migraine: Secondary | ICD-10-CM

## 2021-11-15 LAB — BASIC METABOLIC PANEL
Anion gap: 10 (ref 5–15)
BUN: 5 mg/dL — ABNORMAL LOW (ref 6–20)
CO2: 21 mmol/L — ABNORMAL LOW (ref 22–32)
Calcium: 8.7 mg/dL — ABNORMAL LOW (ref 8.9–10.3)
Chloride: 106 mmol/L (ref 98–111)
Creatinine, Ser: 0.66 mg/dL (ref 0.44–1.00)
GFR, Estimated: >60 mL/min (ref >60)
Glucose, Bld: 125 mg/dL — ABNORMAL HIGH (ref 70–99)
Potassium: 3.3 mmol/L — ABNORMAL LOW (ref 3.5–5.1)
Sodium: 137 mmol/L (ref 135–145)

## 2021-11-15 LAB — CBC WITH DIFFERENTIAL/PLATELET
Abs Immature Granulocytes: 0.04 10*3/uL (ref 0.00–0.07)
Basophils Absolute: 0 10*3/uL (ref 0.0–0.1)
Basophils Relative: 0 %
Eosinophils Absolute: 0.2 10*3/uL (ref 0.0–0.5)
Eosinophils Relative: 2 %
HCT: 32.3 % — ABNORMAL LOW (ref 36.0–46.0)
Hemoglobin: 11.2 g/dL — ABNORMAL LOW (ref 12.0–15.0)
Immature Granulocytes: 0 %
Lymphocytes Relative: 20 %
Lymphs Abs: 2.3 10*3/uL (ref 0.7–4.0)
MCH: 31.6 pg (ref 26.0–34.0)
MCHC: 34.7 g/dL (ref 30.0–36.0)
MCV: 91.2 fL (ref 80.0–100.0)
Monocytes Absolute: 1.3 10*3/uL — ABNORMAL HIGH (ref 0.1–1.0)
Monocytes Relative: 11 %
Neutro Abs: 7.4 10*3/uL (ref 1.7–7.7)
Neutrophils Relative %: 67 %
Platelets: 499 10*3/uL — ABNORMAL HIGH (ref 150–400)
RBC: 3.54 MIL/uL — ABNORMAL LOW (ref 3.87–5.11)
RDW: 12.6 % (ref 11.5–15.5)
WBC: 11.1 10*3/uL — ABNORMAL HIGH (ref 4.0–10.5)
nRBC: 0 % (ref 0.0–0.2)

## 2021-11-15 LAB — SURGICAL PATHOLOGY

## 2021-11-15 MED ORDER — PANTOPRAZOLE SODIUM 40 MG PO TBEC: 40.0000 mg | DELAYED_RELEASE_TABLET | Freq: Two times a day (BID) | ORAL | 1 refills | Status: DC

## 2021-11-15 MED ORDER — PANTOPRAZOLE SODIUM 40 MG PO TBEC: 40.0000 mg | DELAYED_RELEASE_TABLET | Freq: Two times a day (BID) | ORAL | Status: DC

## 2021-11-15 MED ORDER — POTASSIUM CHLORIDE CRYS ER 20 MEQ PO TBCR
40.0000 meq | EXTENDED_RELEASE_TABLET | ORAL | Status: DC
  Administered 2021-11-15: 40 meq via ORAL
  Filled 2021-11-15: qty 2

## 2021-11-15 NOTE — Anesthesia Postprocedure Evaluation (Signed)
Anesthesia Post Note  Patient: Pamela Livingston  Procedure(s) Performed: ESOPHAGOGASTRODUODENOSCOPY (EGD) WITH PROPOFOL BIOPSY     Patient location during evaluation: Endoscopy Anesthesia Type: MAC Level of consciousness: awake and alert Pain management: pain level controlled Vital Signs Assessment: post-procedure vital signs reviewed and stable Respiratory status: spontaneous breathing, nonlabored ventilation and respiratory function stable Cardiovascular status: stable and blood pressure returned to baseline Postop Assessment: no apparent nausea or vomiting Anesthetic complications: no   No notable events documented.  Last Vitals:  Vitals:   11/15/21 0525 11/15/21 0850  BP: (!) 150/86 134/89  Pulse: 92 (!) 101  Resp: 16 16  Temp: 36.8 C 36.6 C  SpO2: 93% 91%    Last Pain:  Vitals:   11/15/21 0850  TempSrc: Oral  PainSc:                  Swayze Pries

## 2021-11-15 NOTE — Progress Notes (Signed)
PT Cancellation Note  Patient Details Name: Pamela Livingston MRN: [id number] DOB: [birth year]   Cancelled Treatment:    Reason Eval/Treat Not Completed: PT screened, no needs identified, will sign off PT orders received, chart reviewed. Pt noted to be ambulating 280 ft without AD with supervision with mobility specialist. Pt does not require formal PT evaluation at this time. Team aware, orders completed.  Lavone Nian, PT, DPT 11/15/21, 8:38 AM   Waunita Schooner 11/15/2021, 8:38 AM

## 2021-11-15 NOTE — Discharge Summary (Signed)
Physician Discharge Summary  Pamela Livingston WGY:[id number] DOB: 1965-05-08 DOA: 11/13/2021  PCP: Soyla Dryer, PA-C  Admit date: 11/13/2021 Discharge date: 11/15/2021 30 Day Unplanned Readmission Risk Score    Flowsheet Row ED to Hosp-Admission (Current) from 11/13/2021 in [facility]  30 Day Unplanned Readmission Risk Score (%) 14.33 Filed at 11/15/2021 0801       This score is the patient's risk of an unplanned readmission within 30 days of being discharged (0 -100%). The score is based on dignosis, age, lab data, medications, orders, and past utilization.   Low:  0-14.9   Medium: 15-21.9   High: 22-29.9   Extreme: 30 and above          Admitted From: Home  Disposition: Home  Recommendations for Outpatient Follow-up:  Follow up with PCP in 1-2 weeks Please obtain BMP/CBC in one week Follow-up with GI/Dr. Sherryll Burger in 4 weeks Please follow up with your PCP on the following pending results: Unresulted Labs (From admission, onward)     Start     Ordered   11/15/21 0500  CBC with Differential/Platelet  Daily at 5am,   R      11/14/21 [place]: None Equipment/Devices: None  Discharge Condition: Stable CODE STATUS: Full code Diet recommendation: Cardiac  Subjective: Seen and examined.  Feels much better.  Very minimal pain behind her right breast which is likely musculoskeletal but otherwise she is feeling better.  She is ready to go home.  Brief/Interim Summary:  Pamela Livingston is a 56 y.o. female with medical history significant of COPD, arthritis, PUD, GERD, obesity, and polysubstance abuse who presented with complaints of abdominal pain.  She reports having right lower quadrant and epigastric abdominal pain for the last 5 days.  Reported associated symptoms of nausea, vomiting, diarrhea, hot/cold flashes, and dark urine.  Emesis was reported to be nonbloody in appearance.  Patient had been taking Pepto-Bismol  without improvement in symptoms.  Stools have been reported to be dark black without clear signs of blood present.  Patient notes that she continues to smoke about half pack cigarettes per day on average and chronically wheezes.  Denies any NSAID use, but is on meloxicam and takes it daily for arthritis pain.  Patient had colonoscopy and EGD last 07/26/2020 for positive fecal occult test along with dysphagia/heartburn which had noted nonbleeding internal hemorrhoid, one 4 mm polyp of the descending colon which was removed, small hiatal hernia, mild Slutsky ring which was dilated, and erythematous mucosa in the gastric body.   Upon admission into the patient was noted to be tachypneic and tachycardic with vital signs otherwise maintained.  Labs significant for WBC 15.6, hemoglobin 11.8, potassium 3.2, CO2 17, glucose 94, anion gap 16, and i-STAT hCG level 150.9.  Urinalysis noted large hemoglobin, 80 ketones, rare bacteria, 21-50 RBC/hpf.  CT angiogram of the chest along with CT abdomen/pelvis with contrast had been obtained noting new small right pleural effusion, no PE, and no other acute abdominal findings.  Fecal occult testing was positive.   Patient has been given 1 L of lactated Ringer's, potassium chloride 40 mEq Reglan, DuoNeb breathing treatment, fentanyl for pain, Reglan, and started on a Protonix drip.  And she was hospitalized with GI consultation.  Acute blood loss anemia 2/2 GI bleed: Patient reports having dark black stools, but has been taking Pepto-Bismol.  Stool guaiac positive for  blood.  Hemoglobin 11.8 g/dL and is slightly lower than baseline which had been within normal limits at 12.3 on 10/19.  Patient is on meloxicam which she takes daily for arthritis pain for about a year.  Seen by GI.  She did not require any blood transfusion.  She underwent EGD by Dr. Arelia Longest on 11/14/2021 and was found to have erythematous gastric mucosa with gastric polyp which was biopsied, pathology pending.   Patient has not had any further GI bleeding evidence.  Per GI, her black stools are likely due to Pepto-Bismol.  They have cleared her.  She is tolerating regular diet and she is feeling better and wants to go home.  They have recommended increasing the PPI to twice daily and instructed not to take any NSAIDs or meloxicam.  Patient understands that.   Leukocytosis: Nonspecific, she is afebrile.  Improving.     Abdominal pain/chest pain: Complains of pain in the right upper quadrant that travels to the right anterior chest.  CTA chest negative for PE, CT abdomen and pelvis with contrast negative for acute pathology as well.   Nausea, vomiting, diarrhea: Patient reports 5-day history of symptoms. Noted to have RLQ abd pain although no clear cause for symptoms appreciated on CT. SIM times have resolved.   Hematuria :  Patient denied any complaints of dysuria, but noted dark urine which is consistent with bleeding.  CT scan of the abdomen pelvis did not note any clear cause for patient's symptoms.  Patient had initially been given Rocephin which has not continued after admission and I agree with that since patient is totally asymptomatic.    Hypokalemia: Low again, will replace before discharge.   COPD, without acute exacerbation Patient currently without wheezing on physical exam. -Continue pharmacy substitution for Wixela inhaler -DuoNebs as needed for shortness of breath/wheezing   Essential hypertension Initially elevated up to 157/100.  Home blood pressure regimen includes lisinopril 20 mg daily . -Continue lisinopril.  Blood pressure controlled now.   Elevated i-STAT beta-hCG Acute.  I-STAT beta-hCG elevated at 150.9.  Documented history of history of hysterectomy, but uterus still present on CT scan of the abdomen. patient states that her ovaries were removed and suspect false positive as no other acute abnormality appreciated on the CT.   Tobacco abuse Patient reports smoking half pack  of cigarettes per day on average -Counseled on the need of cessation of tobacco use -Nicotine patch offered   Polysubstance abuse Patient admits to marijuana use and has prior history cociane use last documented in 2008   Obesity BMI 33.33 kg/m.  Weight loss and dietary modification counseling provided.  Discharge plan was discussed with patient and/or family member and they verbalized understanding and agreed with it.  Discharge Diagnoses:  Principal Problem:   GI bleed Active Problems:   Acute blood loss anemia   Leukocytosis   Nausea, vomiting, and diarrhea   Hematuria   Hypokalemia   Chronic obstructive pulmonary disease (HCC)   Cigarette nicotine dependence with nicotine-induced disorder   History of substance abuse (HCC)   Obesity (BMI 30-39.9)   GIB (gastrointestinal bleeding)   Persistent vomiting   Black stools   Heme + stool   Gastric polyp    Discharge Instructions   Allergies as of 11/15/2021       Reactions   Asa [aspirin] Anaphylaxis, Hives, Swelling   Codeine Nausea Only        Medication List     STOP taking these medications  meloxicam 7.5 MG tablet Commonly known as: MOBIC   omeprazole 40 MG capsule Commonly known as: PRILOSEC       TAKE these medications    acetaminophen 500 MG tablet Commonly known as: TYLENOL Take 1,500 mg by mouth every 6 (six) hours as needed for moderate pain.   albuterol 108 (90 Base) MCG/ACT inhaler Commonly known as: VENTOLIN HFA INHALE 2 PUFFS BY MOUTH EVERY 6 HOURS AS NEEDED FOR COUGHING, WHEEZING, OR SHORTNESS OF BREATH What changed: See the new instructions.   albuterol (2.5 MG/3ML) 0.083% nebulizer solution Commonly known as: PROVENTIL INHALE 1 VIAL VIA NEBULIZER EVERY 6 HOURS AS NEEDED FOR WHEEZING OR SHORTNESS OF BREATH What changed: See the new instructions.   atorvastatin 20 MG tablet Commonly known as: LIPITOR TAKE 1 Tablet BY MOUTH ONCE DAILY   cyclobenzaprine 10 MG tablet Commonly  known as: FLEXERIL Take 1 tablet (10 mg total) by mouth 3 (three) times daily as needed for muscle spasms.   diphenhydrAMINE 25 MG tablet Commonly known as: BENADRYL Take 50 mg by mouth at bedtime.   guaiFENesin 600 MG 12 hr tablet Commonly known as: MUCINEX Take 600 mg by mouth daily.   lisinopril 20 MG tablet Commonly known as: ZESTRIL TAKE 1 Tablet BY MOUTH ONCE DAILY   loratadine-pseudoephedrine 10-240 MG 24 hr tablet Commonly known as: CLARITIN-D 24-hour Take 1 tablet by mouth in the morning.   montelukast 10 MG tablet Commonly known as: SINGULAIR TAKE 1 Tablet BY MOUTH ONCE DAILY AS NEEDED What changed: reasons to take this   nitroGLYCERIN 0.4 MG SL tablet Commonly known as: NITROSTAT Place 1 tablet (0.4 mg total) under the tongue every 5 (five) minutes as needed for chest pain.   pantoprazole 40 MG tablet Commonly known as: Protonix Take 1 tablet (40 mg total) by mouth 2 (two) times daily.   Wixela Inhub 100-50 MCG/ACT Aepb Generic drug: fluticasone-salmeterol Inhale 1 puff into the lungs 2 (two) times daily.        Follow-up Information     Eloise Harman, DO. Schedule an appointment as soon as possible for a visit.   Specialty: Gastroenterology Why: arrange follow up for rectal bleeding and nausea. Contact information: [place] [address] [phone]         Soyla Dryer, PA-C Follow up in 1 week(s).   Specialty: Physician Assistant Contact information: [place] [address] [phone]                Allergies  Allergen Reactions   Asa [Aspirin] Anaphylaxis, Hives and Swelling   Codeine Nausea Only    Consultations: GI   Procedures/Studies: CT ABDOMEN PELVIS W CONTRAST  Result Date: 11/13/2021 CLINICAL DATA:  Abdominal pain EXAM: CT ABDOMEN AND PELVIS WITH CONTRAST TECHNIQUE: Multidetector CT imaging of the abdomen and pelvis was performed using the standard protocol following bolus  administration of intravenous contrast. RADIATION DOSE REDUCTION: This exam was performed according to the departmental dose-optimization program which includes automated exposure control, adjustment of the mA and/or kV according to patient size and/or use of iterative reconstruction technique. CONTRAST:  21m OMNIPAQUE IOHEXOL 350 MG/ML SOLN COMPARISON:  None Available. FINDINGS: Lower chest: Small right pleural effusion with associated passive atelectasis. Hepatobiliary: Hepatic steatosis. Liver has a contour. No focal liver lesions are seen. Portal veins are contrast opacified. Gallbladder is surgically absent. No evidence of intra or extrahepatic biliary duct dilatation. Pancreas: No evidence of Peri pancreatic fat stranding. No evidence of pancreatic ductal  dilatation. Spleen: Spleen is normal in size. No evidence of focal lesion or perisplenic fluid. Adrenals/Urinary Tract: Bilateral adrenal glands are normal in appearance. Evidence of hydronephrosis or nephrolithiasis. No focal renal lesions are visualized. There are nonspecific calcifications along the course of the right ureter, possibly related to prior embolization procedure. The urinary bladder is fluid-filled without evidence of focal wall thickening. Stomach/Bowel: The stomach, small bowel, large bowel are normal in caliber without evidence of bowel obstruction. There is diverticulosis without definitive evidence of diverticulitis. Status post appendectomy. Vascular/Lymphatic: No significant vascular findings are present. No enlarged abdominal or pelvic lymph nodes. Reproductive: Uterus and bilateral adnexa are unremarkable. Other: No abdominal wall hernia or abnormality. No abdominopelvic ascites. Musculoskeletal: No acute or significant osseous findings. IMPRESSION: No finding below the diaphragm to explain abdominal pain. See separately dictated CT of the chest. Electronically Signed   By: Marin Roberts M.D.   On: 11/13/2021 13:47   CT Angio Chest  PE W and/or Wo Contrast  Result Date: 11/13/2021 CLINICAL DATA:  Right sided pain, emesis EXAM: CT ANGIOGRAPHY CHEST WITH CONTRAST TECHNIQUE: Multidetector CT imaging of the chest was performed using the standard protocol during bolus administration of intravenous contrast. Multiplanar CT image reconstructions and MIPs were obtained to evaluate the vascular anatomy. RADIATION DOSE REDUCTION: This exam was performed according to the departmental dose-optimization program which includes automated exposure control, adjustment of the mA and/or kV according to patient size and/or use of iterative reconstruction technique. CONTRAST:  64m OMNIPAQUE IOHEXOL 350 MG/ML SOLN COMPARISON:  12/12/2019 FINDINGS: Cardiovascular: Heart size normal. No pericardial effusion. The RV is nondilated. Scattered coronary calcifications. Adequate contrast opacification of the thoracic aorta with no evidence of dissection, aneurysm, or stenosis. There is classic 3-vessel brachiocephalic arch anatomy without proximal stenosis. Scattered partially calcified plaque in the descending thoracic aorta. Mediastinum/Nodes: No mass or adenopathy. Lungs/Pleura: New small right pleural effusion. No pneumothorax. Dependent atelectasis at the right lung base. Platelike linear scarring or subsegmental atelectasis in the left lower lobe as before. Upper Abdomen: No acute findings. Musculoskeletal: Mild spondylitic changes in the lower cervical, mid and lower thoracic spine. No acute findings. Review of the MIP images confirms the above findings. IMPRESSION: 1. Negative for acute PE or thoracic aortic dissection. 2. New small right pleural effusion. 3. Coronary and  aortic Atherosclerosis (ICD10-I70.0). Electronically Signed   By: DLucrezia EuropeM.D.   On: 11/13/2021 13:31   DG Chest 2 View  Result Date: 11/09/2021 CLINICAL DATA:  Mid chest tightness. Shortness of breath and nausea. Vomiting yesterday. EXAM: CHEST - 2 VIEW COMPARISON:  Chest two views  05/17/2021 and 12/20/2019 FINDINGS: Cardiac silhouette is again mildly enlarged. Mediastinal contours are within normal limits with mild calcification within aortic arch. New right mid to lower lung horizontal linear subsegmental atelectasis. No focal airspace opacity to indicate pneumonia. No pleural effusion or pneumothorax. Mild-to-moderate multilevel degenerative disc changes of the thoracic spine. Likely cholecystectomy clips. IMPRESSION: New right mid to lower lung subsegmental atelectasis. No radiographic evidence of pneumonia. Electronically Signed   By: RYvonne KendallM.D.   On: 11/09/2021 12:37     Discharge Exam: Vitals:   11/15/21 0525 11/15/21 0850  BP: (!) 150/86 134/89  Pulse: 92 (!) 101  Resp: 16 16  Temp: 98.3 F (36.8 C) 97.8 F (36.6 C)  SpO2: 93% 91%   Vitals:   11/14/21 2142 11/15/21 0028 11/15/21 0525 11/15/21 0850  BP: (!) 141/92 137/79 (!) 150/86 134/89  Pulse: 100 86 92 (!) 101  Resp: '16 16 16 16  '$ Temp: 98.5 F (36.9 C) 98.7 F (37.1 C) 98.3 F (36.8 C) 97.8 F (36.6 C)  TempSrc: Oral Oral Oral Oral  SpO2: 94% 94% 93% 91%  Weight:      Height:        General: Pt is alert, awake, not in acute distress Cardiovascular: RRR, S1/S2 +, no rubs, no gallops Respiratory: CTA bilaterally, no wheezing, no rhonchi Abdominal: Soft, NT, ND, bowel sounds + Extremities: no edema, no cyanosis    The results of significant diagnostics from this hospitalization (including imaging, microbiology, ancillary and laboratory) are listed below for reference.     Microbiology: Recent Results (from the past 240 hour(s))  Resp Panel by RT-PCR (Flu A&B, Covid) Anterior Nasal Swab     Status: None   Collection Time: 11/09/21  2:46 PM   Specimen: Anterior Nasal Swab  Result Value Ref Range Status   SARS Coronavirus 2 by RT PCR NEGATIVE NEGATIVE Final    Comment: (NOTE) SARS-CoV-2 target nucleic acids are NOT DETECTED.  The SARS-CoV-2 RNA is generally detectable in upper  respiratory specimens during the acute phase of infection. The lowest concentration of SARS-CoV-2 viral copies this assay can detect is 138 copies/mL. A negative result does not preclude SARS-Cov-2 infection and should not be used as the sole basis for treatment or other patient management decisions. A negative result may occur with  improper specimen collection/handling, submission of specimen other than nasopharyngeal swab, presence of viral mutation(s) within the areas targeted by this assay, and inadequate number of viral copies(<138 copies/mL). A negative result must be combined with clinical observations, patient history, and epidemiological information. The expected result is Negative.  Fact Sheet for Patients:  EntrepreneurPulse.com.au  Fact Sheet for Healthcare Providers:  IncredibleEmployment.be  This test is no t yet approved or cleared by the [place] FDA and  has been authorized for detection and/or diagnosis of SARS-CoV-2 by FDA under an Emergency Use Authorization (EUA). This EUA will remain  in effect (meaning this test can be used) for the duration of the COVID-19 declaration under Section 564(b)(1) of the Act, 21 U.S.C.section 360bbb-3(b)(1), unless the authorization is terminated  or revoked sooner.       Influenza A by PCR NEGATIVE NEGATIVE Final   Influenza B by PCR NEGATIVE NEGATIVE Final    Comment: (NOTE) The Xpert Xpress SARS-CoV-2/FLU/RSV plus assay is intended as an aid in the diagnosis of influenza from Nasopharyngeal swab specimens and should not be used as a sole basis for treatment. Nasal washings and aspirates are unacceptable for Xpert Xpress SARS-CoV-2/FLU/RSV testing.  Fact Sheet for Patients: EntrepreneurPulse.com.au  Fact Sheet for Healthcare Providers: IncredibleEmployment.be  This test is not yet approved or cleared by the [place] FDA and has been  authorized for detection and/or diagnosis of SARS-CoV-2 by FDA under an Emergency Use Authorization (EUA). This EUA will remain in effect (meaning this test can be used) for the duration of the COVID-19 declaration under Section 564(b)(1) of the Act, 21 U.S.C. section 360bbb-3(b)(1), unless the authorization is terminated or revoked.  Performed at [facility], [address] [place], [place] [address]   Resp Panel by RT-PCR (Flu A&B, Covid) Anterior Nasal Swab     Status: None   Collection Time: 11/13/21  9:26 AM   Specimen: Anterior Nasal Swab  Result Value Ref Range Status   SARS Coronavirus 2 by RT PCR NEGATIVE NEGATIVE Final    Comment: (NOTE) SARS-CoV-2 target nucleic acids are NOT  DETECTED.  The SARS-CoV-2 RNA is generally detectable in upper respiratory specimens during the acute phase of infection. The lowest concentration of SARS-CoV-2 viral copies this assay can detect is 138 copies/mL. A negative result does not preclude SARS-Cov-2 infection and should not be used as the sole basis for treatment or other patient management decisions. A negative result may occur with  improper specimen collection/handling, submission of specimen other than nasopharyngeal swab, presence of viral mutation(s) within the areas targeted by this assay, and inadequate number of viral copies(<138 copies/mL). A negative result must be combined with clinical observations, patient history, and epidemiological information. The expected result is Negative.  Fact Sheet for Patients:  EntrepreneurPulse.com.au  Fact Sheet for Healthcare Providers:  IncredibleEmployment.be  This test is no t yet approved or cleared by the [place] FDA and  has been authorized for detection and/or diagnosis of SARS-CoV-2 by FDA under an Emergency Use Authorization (EUA). This EUA will remain  in effect (meaning this test can be used) for the duration of the COVID-19 declaration  under Section 564(b)(1) of the Act, 21 U.S.C.section 360bbb-3(b)(1), unless the authorization is terminated  or revoked sooner.       Influenza A by PCR NEGATIVE NEGATIVE Final   Influenza B by PCR NEGATIVE NEGATIVE Final    Comment: (NOTE) The Xpert Xpress SARS-CoV-2/FLU/RSV plus assay is intended as an aid in the diagnosis of influenza from Nasopharyngeal swab specimens and should not be used as a sole basis for treatment. Nasal washings and aspirates are unacceptable for Xpert Xpress SARS-CoV-2/FLU/RSV testing.  Fact Sheet for Patients: EntrepreneurPulse.com.au  Fact Sheet for Healthcare Providers: IncredibleEmployment.be  This test is not yet approved or cleared by the [place] FDA and has been authorized for detection and/or diagnosis of SARS-CoV-2 by FDA under an Emergency Use Authorization (EUA). This EUA will remain in effect (meaning this test can be used) for the duration of the COVID-19 declaration under Section 564(b)(1) of the Act, 21 U.S.C. section 360bbb-3(b)(1), unless the authorization is terminated or revoked.  Performed at [facility], [place] [address] [place], [place] [address]   Urine Culture     Status: None   Collection Time: 11/13/21  3:32 PM   Specimen: Urine, Clean Catch  Result Value Ref Range Status   Specimen Description URINE, CLEAN CATCH  Final   Special Requests NONE  Final   Culture   Final    NO GROWTH Performed at [facility], [place] [address] [place], [place] [address]    Report Status 11/14/2021 FINAL  Final     Labs: BNP (last 3 results) Recent Labs    11/14/21 0344  BNP 35.3   Basic Metabolic Panel: Recent Labs  Lab 11/09/21 1200 11/13/21 1043 11/14/21 0344 11/14/21 1224 11/15/21 0603  NA 138 135 134*  --  137  K 2.9* 3.2* 3.1*  --  3.3*  CL 106 102 104  --  106  CO2 20* 17* 19*  --  21*  GLUCOSE 118* 94 89  --  125*  BUN '9 9 7  '$ --  5*  CREATININE 0.76 0.76 0.70   --  0.66  CALCIUM 9.3 8.9 8.5*  --  8.7*  MG  --   --   --  1.7  --    Liver Function Tests: Recent Labs  Lab 11/09/21 1435 11/13/21 1043  AST 12* 12*  ALT 9 11  ALKPHOS 76 83  BILITOT 0.6 0.8  PROT 7.0 6.5  ALBUMIN 3.7 2.8*   Recent Labs  Lab 11/09/21 1435 11/13/21 1043  LIPASE 20 20   No results for input(s): "AMMONIA" in the last 168 hours. CBC: Recent Labs  Lab 11/09/21 1200 11/13/21 1043 11/13/21 1714 11/14/21 0344 11/15/21 0603  WBC 13.9* 15.6*  --  12.1* 11.1*  NEUTROABS  --   --   --   --  7.4  HGB 12.3 11.8* 11.1* 10.5* 11.2*  HCT 36.3 33.6* 33.1* 31.3* 32.3*  MCV 93.3 91.3  --  93.7 91.2  PLT 344 412*  --  416* 499*   Cardiac Enzymes: No results for input(s): "CKTOTAL", "CKMB", "CKMBINDEX", "TROPONINI" in the last 168 hours. BNP: Invalid input(s): "POCBNP" CBG: No results for input(s): "GLUCAP" in the last 168 hours. D-Dimer No results for input(s): "DDIMER" in the last 72 hours. Hgb A1c No results for input(s): "HGBA1C" in the last 72 hours. Lipid Profile No results for input(s): "CHOL", "HDL", "LDLCALC", "TRIG", "CHOLHDL", "LDLDIRECT" in the last 72 hours. Thyroid function studies No results for input(s): "TSH", "T4TOTAL", "T3FREE", "THYROIDAB" in the last 72 hours.  Invalid input(s): "FREET3" Anemia work up No results for input(s): "VITAMINB12", "FOLATE", "FERRITIN", "TIBC", "IRON", "RETICCTPCT" in the last 72 hours. Urinalysis    Component Value Date/Time   COLORURINE YELLOW 11/13/2021 0837   APPEARANCEUR CLEAR 11/13/2021 0837   LABSPEC >1.030 (H) 11/13/2021 0837   PHURINE 5.5 11/13/2021 0837   GLUCOSEU NEGATIVE 11/13/2021 0837   HGBUR LARGE (A) 11/13/2021 0837   BILIRUBINUR MODERATE (A) 11/13/2021 0837   BILIRUBINUR neg 05/16/2020 1023   KETONESUR >80 (A) 11/13/2021 0837   PROTEINUR 100 (A) 11/13/2021 0837   UROBILINOGEN 0.2 05/16/2020 1023   UROBILINOGEN 0.2 06/03/2014 1000   NITRITE NEGATIVE 11/13/2021 0837   LEUKOCYTESUR  NEGATIVE 11/13/2021 0837   Sepsis Labs Recent Labs  Lab 11/09/21 1200 11/13/21 1043 11/14/21 0344 11/15/21 0603  WBC 13.9* 15.6* 12.1* 11.1*   Microbiology Recent Results (from the past 240 hour(s))  Resp Panel by RT-PCR (Flu A&B, Covid) Anterior Nasal Swab     Status: None   Collection Time: 11/09/21  2:46 PM   Specimen: Anterior Nasal Swab  Result Value Ref Range Status   SARS Coronavirus 2 by RT PCR NEGATIVE NEGATIVE Final    Comment: (NOTE) SARS-CoV-2 target nucleic acids are NOT DETECTED.  The SARS-CoV-2 RNA is generally detectable in upper respiratory specimens during the acute phase of infection. The lowest concentration of SARS-CoV-2 viral copies this assay can detect is 138 copies/mL. A negative result does not preclude SARS-Cov-2 infection and should not be used as the sole basis for treatment or other patient management decisions. A negative result may occur with  improper specimen collection/handling, submission of specimen other than nasopharyngeal swab, presence of viral mutation(s) within the areas targeted by this assay, and inadequate number of viral copies(<138 copies/mL). A negative result must be combined with clinical observations, patient history, and epidemiological information. The expected result is Negative.  Fact Sheet for Patients:  EntrepreneurPulse.com.au  Fact Sheet for Healthcare Providers:  IncredibleEmployment.be  This test is no t yet approved or cleared by the [place] FDA and  has been authorized for detection and/or diagnosis of SARS-CoV-2 by FDA under an Emergency Use Authorization (EUA). This EUA will remain  in effect (meaning this test can be used) for the duration of the COVID-19 declaration under Section 564(b)(1) of the Act, 21 U.S.C.section 360bbb-3(b)(1), unless the authorization is terminated  or revoked sooner.  Influenza A by PCR NEGATIVE NEGATIVE Final   Influenza B by  PCR NEGATIVE NEGATIVE Final    Comment: (NOTE) The Xpert Xpress SARS-CoV-2/FLU/RSV plus assay is intended as an aid in the diagnosis of influenza from Nasopharyngeal swab specimens and should not be used as a sole basis for treatment. Nasal washings and aspirates are unacceptable for Xpert Xpress SARS-CoV-2/FLU/RSV testing.  Fact Sheet for Patients: EntrepreneurPulse.com.au  Fact Sheet for Healthcare Providers: IncredibleEmployment.be  This test is not yet approved or cleared by the [place] FDA and has been authorized for detection and/or diagnosis of SARS-CoV-2 by FDA under an Emergency Use Authorization (EUA). This EUA will remain in effect (meaning this test can be used) for the duration of the COVID-19 declaration under Section 564(b)(1) of the Act, 21 U.S.C. section 360bbb-3(b)(1), unless the authorization is terminated or revoked.  Performed at [facility], [address], [place], [place] [address]   Resp Panel by RT-PCR (Flu A&B, Covid) Anterior Nasal Swab     Status: None   Collection Time: 11/13/21  9:26 AM   Specimen: Anterior Nasal Swab  Result Value Ref Range Status   SARS Coronavirus 2 by RT PCR NEGATIVE NEGATIVE Final    Comment: (NOTE) SARS-CoV-2 target nucleic acids are NOT DETECTED.  The SARS-CoV-2 RNA is generally detectable in upper respiratory specimens during the acute phase of infection. The lowest concentration of SARS-CoV-2 viral copies this assay can detect is 138 copies/mL. A negative result does not preclude SARS-Cov-2 infection and should not be used as the sole basis for treatment or other patient management decisions. A negative result may occur with  improper specimen collection/handling, submission of specimen other than nasopharyngeal swab, presence of viral mutation(s) within the areas targeted by this assay, and inadequate number of viral copies(<138 copies/mL). A negative result must be combined  with clinical observations, patient history, and epidemiological information. The expected result is Negative.  Fact Sheet for Patients:  EntrepreneurPulse.com.au  Fact Sheet for Healthcare Providers:  IncredibleEmployment.be  This test is no t yet approved or cleared by the [place] FDA and  has been authorized for detection and/or diagnosis of SARS-CoV-2 by FDA under an Emergency Use Authorization (EUA). This EUA will remain  in effect (meaning this test can be used) for the duration of the COVID-19 declaration under Section 564(b)(1) of the Act, 21 U.S.C.section 360bbb-3(b)(1), unless the authorization is terminated  or revoked sooner.       Influenza A by PCR NEGATIVE NEGATIVE Final   Influenza B by PCR NEGATIVE NEGATIVE Final    Comment: (NOTE) The Xpert Xpress SARS-CoV-2/FLU/RSV plus assay is intended as an aid in the diagnosis of influenza from Nasopharyngeal swab specimens and should not be used as a sole basis for treatment. Nasal washings and aspirates are unacceptable for Xpert Xpress SARS-CoV-2/FLU/RSV testing.  Fact Sheet for Patients: EntrepreneurPulse.com.au  Fact Sheet for Healthcare Providers: IncredibleEmployment.be  This test is not yet approved or cleared by the [place] FDA and has been authorized for detection and/or diagnosis of SARS-CoV-2 by FDA under an Emergency Use Authorization (EUA). This EUA will remain in effect (meaning this test can be used) for the duration of the COVID-19 declaration under Section 564(b)(1) of the Act, 21 U.S.C. section 360bbb-3(b)(1), unless the authorization is terminated or revoked.  Performed at [facility], [place] [address], [place], [place] [address]   Urine Culture     Status: None   Collection Time: 11/13/21  3:32 PM   Specimen:  Urine, Clean Catch  Result Value Ref Range Status   Specimen Description URINE, CLEAN CATCH   Final   Special Requests NONE  Final   Culture   Final    NO GROWTH Performed at [facility], [address], [place], [place] [address]    Report Status 11/14/2021 FINAL  Final     Time coordinating discharge: Over 30 minutes  SIGNED:   Darliss Cheney, MD  [facility] 11/15/2021, 9:20 AM *Please note that this is a verbal dictation therefore any spelling or grammatical errors are due to the "[place] One" system interpretation. If 7PM-7AM, please contact night-coverage [email or web address]

## 2021-11-15 NOTE — Progress Notes (Addendum)
Daily Rounding Note  11/15/2021, 8:52 AM  LOS: 1 day   SUBJECTIVE:   Chief complaint:  R flank pain.  Nausea, vomiting.      Last vicodin and Zofran at 0540 today.   Pt says symptoms are much better overall.  Hospitalist is planning discharge today and she is ready to go home.  OBJECTIVE:         Vital signs in last 24 hours:    Temp:  [97.6 F (36.4 C)-98.7 F (37.1 C)] 98.3 F (36.8 C) (10/25 0525) Pulse Rate:  [86-100] 92 (10/25 0525) Resp:  [16-21] 16 (10/25 0525) BP: (94-150)/(72-92) 150/86 (10/25 0525) SpO2:  [93 %-99 %] 93 % (10/25 0525) Last BM Date : 11/14/21 Filed Weights   11/13/21 0834  Weight: 74.8 kg   General: Patient seen while she was taking a shower.  She looks comfortable. Smiling and in good spirits.  She looks better.  Did not examine the patient other than to see her sitting on the shower chair.  No trouble breathing.  I   Lab Results: Recent Labs    11/13/21 1043 11/13/21 1714 11/14/21 0344 11/15/21 0603  WBC 15.6*  --  12.1* 11.1*  HGB 11.8* 11.1* 10.5* 11.2*  HCT 33.6* 33.1* 31.3* 32.3*  PLT 412*  --  416* 499*   BMET Recent Labs    11/13/21 1043 11/14/21 0344 11/15/21 0603  NA 135 134* 137  K 3.2* 3.1* 3.3*  CL 102 104 106  CO2 17* 19* 21*  GLUCOSE 94 89 125*  BUN 9 7 5*  CREATININE 0.76 0.70 0.66  CALCIUM 8.9 8.5* 8.7*   LFT Recent Labs    11/13/21 1043  PROT 6.5  ALBUMIN 2.8*  AST 12*  ALT 11  ALKPHOS 83  BILITOT 0.8   PT/INR Recent Labs    11/13/21 1443  LABPROT 14.0  INR 1.1   Hepatitis Panel No results for input(s): "HEPBSAG", "HCVAB", "HEPAIGM", "HEPBIGM" in the last 72 hours.  Studies/Results: CT ABDOMEN PELVIS W CONTRAST  Result Date: 11/13/2021 CLINICAL DATA:  Abdominal pain EXAM: CT ABDOMEN AND PELVIS WITH CONTRAST TECHNIQUE: Multidetector CT imaging of the abdomen and pelvis was performed using the standard protocol following bolus  administration of intravenous contrast. RADIATION DOSE REDUCTION: This exam was performed according to the departmental dose-optimization program which includes automated exposure control, adjustment of the mA and/or kV according to patient size and/or use of iterative reconstruction technique. CONTRAST:  44m OMNIPAQUE IOHEXOL 350 MG/ML SOLN COMPARISON:  None Available. FINDINGS: Lower chest: Small right pleural effusion with associated passive atelectasis. Hepatobiliary: Hepatic steatosis. Liver has a contour. No focal liver lesions are seen. Portal veins are contrast opacified. Gallbladder is surgically absent. No evidence of intra or extrahepatic biliary duct dilatation. Pancreas: No evidence of Peri pancreatic fat stranding. No evidence of pancreatic ductal dilatation. Spleen: Spleen is normal in size. No evidence of focal lesion or perisplenic fluid. Adrenals/Urinary Tract: Bilateral adrenal glands are normal in appearance. Evidence of hydronephrosis or nephrolithiasis. No focal renal lesions are visualized. There are nonspecific calcifications along the course of the right ureter, possibly related to prior embolization procedure. The urinary bladder is fluid-filled without evidence of focal wall thickening. Stomach/Bowel: The stomach, small bowel, large bowel are normal in caliber without evidence of bowel obstruction. There is diverticulosis without definitive evidence of diverticulitis. Status post appendectomy. Vascular/Lymphatic: No significant vascular findings are present. No enlarged abdominal or pelvic lymph nodes. Reproductive:  Uterus and bilateral adnexa are unremarkable. Other: No abdominal wall hernia or abnormality. No abdominopelvic ascites. Musculoskeletal: No acute or significant osseous findings. IMPRESSION: No finding below the diaphragm to explain abdominal pain. See separately dictated CT of the chest. Electronically Signed   By: Marin Roberts M.D.   On: 11/13/2021 13:47   CT Angio Chest  PE W and/or Wo Contrast  Result Date: 11/13/2021 CLINICAL DATA:  Right sided pain, emesis EXAM: CT ANGIOGRAPHY CHEST WITH CONTRAST TECHNIQUE: Multidetector CT imaging of the chest was performed using the standard protocol during bolus administration of intravenous contrast. Multiplanar CT image reconstructions and MIPs were obtained to evaluate the vascular anatomy. RADIATION DOSE REDUCTION: This exam was performed according to the departmental dose-optimization program which includes automated exposure control, adjustment of the mA and/or kV according to patient size and/or use of iterative reconstruction technique. CONTRAST:  54m OMNIPAQUE IOHEXOL 350 MG/ML SOLN COMPARISON:  12/12/2019 FINDINGS: Cardiovascular: Heart size normal. No pericardial effusion. The RV is nondilated. Scattered coronary calcifications. Adequate contrast opacification of the thoracic aorta with no evidence of dissection, aneurysm, or stenosis. There is classic 3-vessel brachiocephalic arch anatomy without proximal stenosis. Scattered partially calcified plaque in the descending thoracic aorta. Mediastinum/Nodes: No mass or adenopathy. Lungs/Pleura: New small right pleural effusion. No pneumothorax. Dependent atelectasis at the right lung base. Platelike linear scarring or subsegmental atelectasis in the left lower lobe as before. Upper Abdomen: No acute findings. Musculoskeletal: Mild spondylitic changes in the lower cervical, mid and lower thoracic spine. No acute findings. Review of the MIP images confirms the above findings. IMPRESSION: 1. Negative for acute PE or thoracic aortic dissection. 2. New small right pleural effusion. 3. Coronary and  aortic Atherosclerosis (ICD10-I70.0). Electronically Signed   By: DLucrezia EuropeM.D.   On: 11/13/2021 13:31    Scheduled Meds:  atorvastatin  20 mg Oral Daily   diphenhydrAMINE  50 mg Oral QHS   guaiFENesin  600 mg Oral Daily   lisinopril  20 mg Oral Daily   mometasone-formoterol  2 puff  Inhalation BID   montelukast  10 mg Oral QHS   nicotine  14 mg Transdermal Daily   pantoprazole (PROTONIX) IV  40 mg Intravenous Q12H   potassium chloride  40 mEq Oral Q4H   sodium chloride flush  3 mL Intravenous Q12H   Continuous Infusions: PRN Meds:.acetaminophen **OR** acetaminophen, HYDROcodone-acetaminophen, ipratropium-albuterol, ondansetron **OR** ondansetron (ZOFRAN) IV   ASSESMENT:     N/V.  FOBT + stool. Mild chronic rectal bleeding.    11/14/21 EGD: multiple gastric polyps, bxd.  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  Mild patchy gastritis.  No source for bleeding.  Black stool likely from bismuth and FOB+ likely ano-rectal source.  ?slow recovery from viral gastroenteritis? Colonoscopy in 2022 w small TA polyp, non bleeding int rrhoids.      Hepatic steatosis per CT.  Normal LFTs.  Obesity.      Microscopic hematuria.  Non-obstructive R ureter calcifications  per CT  Hypokalemia.  Improved but not yet resolved.  2 additional doses of oral potassium ordered this morning.   PLAN     Protonix 40 mg po bid to  replace Omeprazole.  Take on empty stomach 30 before meals and 2 h after meals.  Would avoid Meloxicam if possible.      For rectal bleeding recommended she can use OTC hydrocortisone cream applied topically into rectum.  Discussed this w pt.     Fup w Dr CHurshel Keys  Azucena Freed  11/15/2021, 8:52 AM Phone [phone]  Agree with Ms. Sandi Carne assessment and plan. Gatha Mayer, MD, [facility]

## 2021-11-16 ENCOUNTER — Encounter (HOSPITAL_COMMUNITY): Payer: Self-pay | Admitting: Internal Medicine

## 2021-11-16 ENCOUNTER — Other Ambulatory Visit: Payer: Self-pay | Admitting: Physician Assistant

## 2021-11-16 ENCOUNTER — Encounter: Payer: Self-pay | Admitting: Internal Medicine

## 2021-11-16 DIAGNOSIS — D62 Acute posthemorrhagic anemia: Secondary | ICD-10-CM

## 2021-11-16 DIAGNOSIS — K922 Gastrointestinal hemorrhage, unspecified: Secondary | ICD-10-CM

## 2021-11-19 ENCOUNTER — Emergency Department (HOSPITAL_COMMUNITY): Payer: Medicaid Other

## 2021-11-19 ENCOUNTER — Emergency Department (HOSPITAL_COMMUNITY)
Admission: EM | Admit: 2021-11-19 | Discharge: 2021-11-20 | Discharge status: Against Medical Advice | Payer: Medicaid Other | Attending: Emergency Medicine | Admitting: Emergency Medicine

## 2021-11-19 ENCOUNTER — Other Ambulatory Visit: Payer: Self-pay

## 2021-11-19 DIAGNOSIS — R0789 Other chest pain: Secondary | ICD-10-CM | POA: Insufficient documentation

## 2021-11-19 DIAGNOSIS — Z5321 Procedure and treatment not carried out due to patient leaving prior to being seen by health care provider: Secondary | ICD-10-CM | POA: Insufficient documentation

## 2021-11-19 DIAGNOSIS — J449 Chronic obstructive pulmonary disease, unspecified: Secondary | ICD-10-CM | POA: Insufficient documentation

## 2021-11-19 DIAGNOSIS — R1110 Vomiting, unspecified: Secondary | ICD-10-CM | POA: Insufficient documentation

## 2021-11-19 LAB — CBC
HCT: 32.7 % — ABNORMAL LOW (ref 36.0–46.0)
Hemoglobin: 11.5 g/dL — ABNORMAL LOW (ref 12.0–15.0)
MCH: 32.2 pg (ref 26.0–34.0)
MCHC: 35.2 g/dL (ref 30.0–36.0)
MCV: 91.6 fL (ref 80.0–100.0)
Platelets: 681 10*3/uL — ABNORMAL HIGH (ref 150–400)
RBC: 3.57 MIL/uL — ABNORMAL LOW (ref 3.87–5.11)
RDW: 12.7 % (ref 11.5–15.5)
WBC: 12.1 10*3/uL — ABNORMAL HIGH (ref 4.0–10.5)
nRBC: 0 % (ref 0.0–0.2)

## 2021-11-19 LAB — BASIC METABOLIC PANEL
Anion gap: 16 — ABNORMAL HIGH (ref 5–15)
BUN: 13 mg/dL (ref 6–20)
CO2: 19 mmol/L — ABNORMAL LOW (ref 22–32)
Calcium: 8.7 mg/dL — ABNORMAL LOW (ref 8.9–10.3)
Chloride: 101 mmol/L (ref 98–111)
Creatinine, Ser: 0.64 mg/dL (ref 0.44–1.00)
GFR, Estimated: >60 mL/min (ref >60)
Glucose, Bld: 111 mg/dL — ABNORMAL HIGH (ref 70–99)
Potassium: 2.7 mmol/L — CL (ref 3.5–5.1)
Sodium: 136 mmol/L (ref 135–145)

## 2021-11-19 LAB — I-STAT BETA HCG BLOOD, ED (MC, WL, AP ONLY): I-stat hCG, quantitative: 209.3 m[IU]/mL — ABNORMAL HIGH (ref <5)

## 2021-11-19 LAB — TROPONIN I (HIGH SENSITIVITY): Troponin I (High Sensitivity): 5 ng/L (ref <18)

## 2021-11-19 NOTE — ED Notes (Signed)
Patient states she cant wait and called a ride so she can leave. Charge RN notified that patient is leaving. IV removed by this Probation officer. Encouraged patient to follow up with PCP

## 2021-11-19 NOTE — ED Triage Notes (Signed)
Pt here from home via [facility] for cp x2 weeks. Pt c/o R side  chest pressure that radiates to R flank, pt also endorses vomiting. EMS put pt on 3L O2 after pt ambulated to ambulance and had increased WOB, RA 95%. EMS gave 1 SL nitro w/o relief, and a total of '6mg'$  morphine, no ASA given due to allergy. Hx of GERD and COPD, recently admitted and was told she has polyps. Cbg 123

## 2021-11-20 ENCOUNTER — Emergency Department (HOSPITAL_COMMUNITY): Payer: Medicaid Other

## 2021-11-20 ENCOUNTER — Inpatient Hospital Stay (HOSPITAL_COMMUNITY): Payer: Medicaid Other

## 2021-11-20 ENCOUNTER — Telehealth: Payer: Self-pay | Admitting: Physician Assistant

## 2021-11-20 ENCOUNTER — Other Ambulatory Visit: Payer: Self-pay

## 2021-11-20 ENCOUNTER — Encounter (HOSPITAL_COMMUNITY): Payer: Self-pay | Admitting: Emergency Medicine

## 2021-11-20 ENCOUNTER — Inpatient Hospital Stay (HOSPITAL_COMMUNITY)
Admission: EM | Admit: 2021-11-20 | Discharge: 2021-11-24 | DRG: 194 | Disposition: A | Discharge status: Home / Self Care | Payer: Self-pay | Attending: Internal Medicine | Admitting: Internal Medicine

## 2021-11-20 DIAGNOSIS — Z79899 Other long term (current) drug therapy: Secondary | ICD-10-CM

## 2021-11-20 DIAGNOSIS — Z8541 Personal history of malignant neoplasm of cervix uteri: Secondary | ICD-10-CM

## 2021-11-20 DIAGNOSIS — Z885 Allergy status to narcotic agent status: Secondary | ICD-10-CM

## 2021-11-20 DIAGNOSIS — J440 Chronic obstructive pulmonary disease with acute lower respiratory infection: Secondary | ICD-10-CM | POA: Diagnosis present

## 2021-11-20 DIAGNOSIS — Z833 Family history of diabetes mellitus: Secondary | ICD-10-CM

## 2021-11-20 DIAGNOSIS — R911 Solitary pulmonary nodule: Secondary | ICD-10-CM | POA: Diagnosis present

## 2021-11-20 DIAGNOSIS — J189 Pneumonia, unspecified organism: Secondary | ICD-10-CM

## 2021-11-20 DIAGNOSIS — Z7951 Long term (current) use of inhaled steroids: Secondary | ICD-10-CM

## 2021-11-20 DIAGNOSIS — J181 Lobar pneumonia, unspecified organism: Principal | ICD-10-CM

## 2021-11-20 DIAGNOSIS — I10 Essential (primary) hypertension: Secondary | ICD-10-CM | POA: Diagnosis present

## 2021-11-20 DIAGNOSIS — Z90710 Acquired absence of both cervix and uterus: Secondary | ICD-10-CM

## 2021-11-20 DIAGNOSIS — K219 Gastro-esophageal reflux disease without esophagitis: Secondary | ICD-10-CM | POA: Diagnosis present

## 2021-11-20 DIAGNOSIS — Z6832 Body mass index (BMI) 32.0-32.9, adult: Secondary | ICD-10-CM

## 2021-11-20 DIAGNOSIS — J90 Pleural effusion, not elsewhere classified: Secondary | ICD-10-CM

## 2021-11-20 DIAGNOSIS — E876 Hypokalemia: Secondary | ICD-10-CM | POA: Diagnosis present

## 2021-11-20 DIAGNOSIS — R112 Nausea with vomiting, unspecified: Secondary | ICD-10-CM | POA: Diagnosis present

## 2021-11-20 DIAGNOSIS — I700 Atherosclerosis of aorta: Secondary | ICD-10-CM | POA: Diagnosis present

## 2021-11-20 DIAGNOSIS — R197 Diarrhea, unspecified: Secondary | ICD-10-CM

## 2021-11-20 DIAGNOSIS — J869 Pyothorax without fistula: Secondary | ICD-10-CM | POA: Diagnosis present

## 2021-11-20 DIAGNOSIS — F17210 Nicotine dependence, cigarettes, uncomplicated: Secondary | ICD-10-CM | POA: Diagnosis present

## 2021-11-20 DIAGNOSIS — Z825 Family history of asthma and other chronic lower respiratory diseases: Secondary | ICD-10-CM

## 2021-11-20 DIAGNOSIS — Z886 Allergy status to analgesic agent status: Secondary | ICD-10-CM

## 2021-11-20 DIAGNOSIS — E785 Hyperlipidemia, unspecified: Secondary | ICD-10-CM | POA: Diagnosis present

## 2021-11-20 DIAGNOSIS — E669 Obesity, unspecified: Secondary | ICD-10-CM | POA: Diagnosis present

## 2021-11-20 DIAGNOSIS — Z9049 Acquired absence of other specified parts of digestive tract: Secondary | ICD-10-CM

## 2021-11-20 DIAGNOSIS — J449 Chronic obstructive pulmonary disease, unspecified: Secondary | ICD-10-CM | POA: Diagnosis present

## 2021-11-20 DIAGNOSIS — Z8711 Personal history of peptic ulcer disease: Secondary | ICD-10-CM

## 2021-11-20 DIAGNOSIS — Z823 Family history of stroke: Secondary | ICD-10-CM

## 2021-11-20 DIAGNOSIS — Z8249 Family history of ischemic heart disease and other diseases of the circulatory system: Secondary | ICD-10-CM

## 2021-11-20 DIAGNOSIS — J918 Pleural effusion in other conditions classified elsewhere: Secondary | ICD-10-CM | POA: Diagnosis present

## 2021-11-20 DIAGNOSIS — Z8261 Family history of arthritis: Secondary | ICD-10-CM

## 2021-11-20 LAB — CBC WITH DIFFERENTIAL/PLATELET
Abs Immature Granulocytes: 0.09 10*3/uL — ABNORMAL HIGH (ref 0.00–0.07)
Basophils Absolute: 0 10*3/uL (ref 0.0–0.1)
Basophils Relative: 0 %
Eosinophils Absolute: 0.1 10*3/uL (ref 0.0–0.5)
Eosinophils Relative: 0 %
HCT: 36 % (ref 36.0–46.0)
Hemoglobin: 12.1 g/dL (ref 12.0–15.0)
Immature Granulocytes: 1 %
Lymphocytes Relative: 18 %
Lymphs Abs: 2.1 10*3/uL (ref 0.7–4.0)
MCH: 31.3 pg (ref 26.0–34.0)
MCHC: 33.6 g/dL (ref 30.0–36.0)
MCV: 93 fL (ref 80.0–100.0)
Monocytes Absolute: 0.9 10*3/uL (ref 0.1–1.0)
Monocytes Relative: 7 %
Neutro Abs: 8.7 10*3/uL — ABNORMAL HIGH (ref 1.7–7.7)
Neutrophils Relative %: 74 %
Platelets: 640 10*3/uL — ABNORMAL HIGH (ref 150–400)
RBC: 3.87 MIL/uL (ref 3.87–5.11)
RDW: 12.9 % (ref 11.5–15.5)
WBC: 11.9 10*3/uL — ABNORMAL HIGH (ref 4.0–10.5)
nRBC: 0 % (ref 0.0–0.2)

## 2021-11-20 LAB — BODY FLUID CELL COUNT WITH DIFFERENTIAL
Eos, Fluid: 0 %
Lymphs, Fluid: 79 %
Monocyte-Macrophage-Serous Fluid: 14 % — ABNORMAL LOW (ref 50–90)
Neutrophil Count, Fluid: 7 % (ref 0–25)
Total Nucleated Cell Count, Fluid: 2454 cu mm — ABNORMAL HIGH (ref 0–1000)

## 2021-11-20 LAB — URINALYSIS, ROUTINE W REFLEX MICROSCOPIC
Bacteria, UA: NONE SEEN
Bilirubin Urine: NEGATIVE
Glucose, UA: NEGATIVE mg/dL
Ketones, ur: 20 mg/dL — AB
Leukocytes,Ua: NEGATIVE
Nitrite: NEGATIVE
Protein, ur: 30 mg/dL — AB
Specific Gravity, Urine: 1.029 (ref 1.005–1.030)
pH: 6 (ref 5.0–8.0)

## 2021-11-20 LAB — COMPREHENSIVE METABOLIC PANEL
ALT: 11 U/L (ref 0–44)
AST: 17 U/L (ref 15–41)
Albumin: 2.9 g/dL — ABNORMAL LOW (ref 3.5–5.0)
Alkaline Phosphatase: 137 U/L — ABNORMAL HIGH (ref 38–126)
Anion gap: 15 (ref 5–15)
BUN: 13 mg/dL (ref 6–20)
CO2: 20 mmol/L — ABNORMAL LOW (ref 22–32)
Calcium: 8.6 mg/dL — ABNORMAL LOW (ref 8.9–10.3)
Chloride: 100 mmol/L (ref 98–111)
Creatinine, Ser: 0.64 mg/dL (ref 0.44–1.00)
GFR, Estimated: >60 mL/min (ref >60)
Glucose, Bld: 90 mg/dL (ref 70–99)
Potassium: 3.3 mmol/L — ABNORMAL LOW (ref 3.5–5.1)
Sodium: 135 mmol/L (ref 135–145)
Total Bilirubin: 0.6 mg/dL (ref 0.3–1.2)
Total Protein: 7.2 g/dL (ref 6.5–8.1)

## 2021-11-20 LAB — CBC
HCT: 32.7 % — ABNORMAL LOW (ref 36.0–46.0)
Hemoglobin: 11.1 g/dL — ABNORMAL LOW (ref 12.0–15.0)
MCH: 31.4 pg (ref 26.0–34.0)
MCHC: 33.9 g/dL (ref 30.0–36.0)
MCV: 92.4 fL (ref 80.0–100.0)
Platelets: 671 10*3/uL — ABNORMAL HIGH (ref 150–400)
RBC: 3.54 MIL/uL — ABNORMAL LOW (ref 3.87–5.11)
RDW: 12.8 % (ref 11.5–15.5)
WBC: 10.1 10*3/uL (ref 4.0–10.5)
nRBC: 0 % (ref 0.0–0.2)

## 2021-11-20 LAB — PROTEIN, PLEURAL OR PERITONEAL FLUID: Total protein, fluid: 4.3 g/dL

## 2021-11-20 LAB — LIPASE, BLOOD: Lipase: 21 U/L (ref 11–51)

## 2021-11-20 LAB — ALBUMIN, PLEURAL OR PERITONEAL FLUID: Albumin, Fluid: 2.2 g/dL

## 2021-11-20 LAB — CREATININE, SERUM
Creatinine, Ser: 0.63 mg/dL (ref 0.44–1.00)
GFR, Estimated: >60 mL/min (ref >60)

## 2021-11-20 LAB — GRAM STAIN: Gram Stain: NONE SEEN

## 2021-11-20 LAB — MAGNESIUM
Magnesium: 1.5 mg/dL — ABNORMAL LOW (ref 1.7–2.4)
Magnesium: 2.2 mg/dL (ref 1.7–2.4)

## 2021-11-20 LAB — POC URINE PREG, ED: Preg Test, Ur: NEGATIVE

## 2021-11-20 LAB — TROPONIN I (HIGH SENSITIVITY): Troponin I (High Sensitivity): 4 ng/L (ref <18)

## 2021-11-20 LAB — LACTATE DEHYDROGENASE, PLEURAL OR PERITONEAL FLUID: LD, Fluid: 634 U/L — ABNORMAL HIGH (ref 3–23)

## 2021-11-20 LAB — PHOSPHORUS: Phosphorus: 3.3 mg/dL (ref 2.5–4.6)

## 2021-11-20 MED ORDER — HEPARIN SODIUM (PORCINE) 5000 UNIT/ML IJ SOLN
5000.0000 [IU] | Freq: Three times a day (TID) | INTRAMUSCULAR | Status: DC
  Administered 2021-11-20 – 2021-11-24 (×11): 5000 [IU] via SUBCUTANEOUS
  Filled 2021-11-20 (×11): qty 1

## 2021-11-20 MED ORDER — ONDANSETRON HCL 4 MG PO TABS: 4.0000 mg | ORAL_TABLET | Freq: Four times a day (QID) | ORAL | Status: DC | PRN

## 2021-11-20 MED ORDER — LORATADINE-PSEUDOEPHEDRINE ER 10-240 MG PO TB24: 1.0000 | ORAL_TABLET | Freq: Every morning | ORAL | Status: DC

## 2021-11-20 MED ORDER — IOHEXOL 300 MG/ML  SOLN
100.0000 mL | Freq: Once | INTRAMUSCULAR | Status: AC | PRN
  Administered 2021-11-20: 100 mL via INTRAVENOUS

## 2021-11-20 MED ORDER — BUDESONIDE 0.5 MG/2ML IN SUSP
0.5000 mg | Freq: Two times a day (BID) | RESPIRATORY_TRACT | Status: DC
  Administered 2021-11-20 – 2021-11-24 (×8): 0.5 mg via RESPIRATORY_TRACT
  Filled 2021-11-20 (×8): qty 2

## 2021-11-20 MED ORDER — PSEUDOEPHEDRINE HCL ER 120 MG PO TB12
120.0000 mg | ORAL_TABLET | Freq: Two times a day (BID) | ORAL | Status: DC
  Filled 2021-11-20 (×3): qty 1

## 2021-11-20 MED ORDER — NICOTINE 14 MG/24HR TD PT24
14.0000 mg | MEDICATED_PATCH | Freq: Every day | TRANSDERMAL | Status: DC
  Filled 2021-11-20 (×6): qty 1

## 2021-11-20 MED ORDER — ACETAMINOPHEN 500 MG PO TABS
1000.0000 mg | ORAL_TABLET | Freq: Once | ORAL | Status: AC
  Administered 2021-11-20: 1000 mg via ORAL
  Filled 2021-11-20: qty 2

## 2021-11-20 MED ORDER — ONDANSETRON HCL 4 MG/2ML IJ SOLN: 4.0000 mg | Freq: Four times a day (QID) | INTRAMUSCULAR | Status: DC | PRN

## 2021-11-20 MED ORDER — SODIUM CHLORIDE 0.9 % IV SOLN: INTRAVENOUS | Status: AC

## 2021-11-20 MED ORDER — ATORVASTATIN CALCIUM 20 MG PO TABS
20.0000 mg | ORAL_TABLET | Freq: Every day | ORAL | Status: DC
  Administered 2021-11-20 – 2021-11-24 (×5): 20 mg via ORAL
  Filled 2021-11-20 (×5): qty 1

## 2021-11-20 MED ORDER — ACETAMINOPHEN 325 MG PO TABS
650.0000 mg | ORAL_TABLET | Freq: Four times a day (QID) | ORAL | Status: DC | PRN
  Administered 2021-11-23 (×2): 650 mg via ORAL
  Filled 2021-11-20 (×2): qty 2

## 2021-11-20 MED ORDER — LACTATED RINGERS IV BOLUS
1000.0000 mL | Freq: Once | INTRAVENOUS | Status: AC
  Administered 2021-11-20: 1000 mL via INTRAVENOUS

## 2021-11-20 MED ORDER — MONTELUKAST SODIUM 10 MG PO TABS
10.0000 mg | ORAL_TABLET | Freq: Every day | ORAL | Status: DC
  Administered 2021-11-21 – 2021-11-24 (×4): 10 mg via ORAL
  Filled 2021-11-20 (×4): qty 1

## 2021-11-20 MED ORDER — PIPERACILLIN-TAZOBACTAM 3.375 G IVPB 30 MIN: 3.3750 g | Freq: Three times a day (TID) | INTRAVENOUS | Status: DC

## 2021-11-20 MED ORDER — PANTOPRAZOLE SODIUM 40 MG PO TBEC
40.0000 mg | DELAYED_RELEASE_TABLET | Freq: Every day | ORAL | Status: DC
  Administered 2021-11-20: 40 mg via ORAL
  Filled 2021-11-20: qty 1

## 2021-11-20 MED ORDER — POTASSIUM CHLORIDE CRYS ER 20 MEQ PO TBCR
40.0000 meq | EXTENDED_RELEASE_TABLET | Freq: Once | ORAL | Status: AC
  Administered 2021-11-20: 40 meq via ORAL
  Filled 2021-11-20: qty 2

## 2021-11-20 MED ORDER — ACETAMINOPHEN 650 MG RE SUPP: 650.0000 mg | Freq: Four times a day (QID) | RECTAL | Status: DC | PRN

## 2021-11-20 MED ORDER — LIDOCAINE HCL (PF) 2 % IJ SOLN
INTRAMUSCULAR | Status: AC
  Filled 2021-11-20: qty 10

## 2021-11-20 MED ORDER — METOCLOPRAMIDE HCL 5 MG/ML IJ SOLN: 10.0000 mg | Freq: Three times a day (TID) | INTRAMUSCULAR | Status: DC | PRN

## 2021-11-20 MED ORDER — MAGNESIUM SULFATE 2 GM/50ML IV SOLN
2.0000 g | Freq: Once | INTRAVENOUS | Status: AC
  Administered 2021-11-20: 2 g via INTRAVENOUS
  Filled 2021-11-20: qty 50

## 2021-11-20 MED ORDER — LORATADINE 10 MG PO TABS
10.0000 mg | ORAL_TABLET | Freq: Every day | ORAL | Status: DC
  Administered 2021-11-21 – 2021-11-24 (×4): 10 mg via ORAL
  Filled 2021-11-20 (×4): qty 1

## 2021-11-20 MED ORDER — ORAL CARE MOUTH RINSE: 15.0000 mL | OROMUCOSAL | Status: DC | PRN

## 2021-11-20 MED ORDER — PANTOPRAZOLE SODIUM 40 MG PO TBEC
40.0000 mg | DELAYED_RELEASE_TABLET | Freq: Two times a day (BID) | ORAL | Status: DC
  Administered 2021-11-20 – 2021-11-24 (×8): 40 mg via ORAL
  Filled 2021-11-20 (×8): qty 1

## 2021-11-20 MED ORDER — PIPERACILLIN-TAZOBACTAM 3.375 G IVPB 30 MIN
3.3750 g | Freq: Once | INTRAVENOUS | Status: AC
  Administered 2021-11-20: 3.375 g via INTRAVENOUS
  Filled 2021-11-20: qty 50

## 2021-11-20 MED ORDER — DM-GUAIFENESIN ER 30-600 MG PO TB12
1.0000 | ORAL_TABLET | Freq: Two times a day (BID) | ORAL | Status: DC
  Administered 2021-11-20 – 2021-11-24 (×9): 1 via ORAL
  Filled 2021-11-20 (×9): qty 1

## 2021-11-20 MED ORDER — IPRATROPIUM-ALBUTEROL 0.5-2.5 (3) MG/3ML IN SOLN
3.0000 mL | Freq: Four times a day (QID) | RESPIRATORY_TRACT | Status: DC | PRN
  Administered 2021-11-22: 3 mL via RESPIRATORY_TRACT
  Filled 2021-11-20: qty 3

## 2021-11-20 MED ORDER — OXYCODONE HCL 5 MG PO TABS
5.0000 mg | ORAL_TABLET | ORAL | Status: AC
  Administered 2021-11-20: 5 mg via ORAL
  Filled 2021-11-20: qty 1

## 2021-11-20 MED ORDER — PIPERACILLIN-TAZOBACTAM 3.375 G IVPB
3.3750 g | Freq: Three times a day (TID) | INTRAVENOUS | Status: DC
  Administered 2021-11-20 – 2021-11-24 (×12): 3.375 g via INTRAVENOUS
  Filled 2021-11-20 (×12): qty 50

## 2021-11-20 MED ORDER — ONDANSETRON HCL 4 MG/2ML IJ SOLN
4.0000 mg | Freq: Once | INTRAMUSCULAR | Status: AC
  Administered 2021-11-20: 4 mg via INTRAVENOUS
  Filled 2021-11-20: qty 2

## 2021-11-20 MED ORDER — MORPHINE SULFATE (PF) 2 MG/ML IV SOLN
2.0000 mg | INTRAVENOUS | Status: DC | PRN
  Administered 2021-11-20 – 2021-11-24 (×17): 2 mg via INTRAVENOUS
  Filled 2021-11-20 (×17): qty 1

## 2021-11-20 NOTE — H&P (Signed)
History and Physical    Patient: Pamela Livingston MRN:[id number] DOB: 09/24/1965 DOA: 11/20/2021 DOS: the patient was seen and examined on 11/20/2021 PCP: McElroy, Shannon, PA-C  Patient coming from: Home  Chief Complaint:  Chief Complaint  Patient presents with   Weakness   HPI: [name] is a 56 y.o. female with medical history significant of gastroesophageal reflux disease, COPD/asthma, history of marijuana abuse and recent admission to the hospital (approximately 5 days ago) secondary to nausea/vomiting and abdominal pain with diarrhea.  Patient reports symptoms never resolving at time of her discharge except for diarrhea by 11/18/2021.  She presented now with general malaise, new development of pleuritic chest discomfort, coughing spells and shortness of breath with activity.  Patient also reported some chills but denies frank fever. Patient's chest pain present intermittently and mainly associated with coughing spells and deep breath. She had continue experiencing intermittent episode of nausea and vomiting at times making it difficult to keep things down and take her meds.  In the ED work-up demonstrating chest x-ray with moderate pleural effusion, with concerns for developing empyema and multifocal pneumonia.  Patient has mild leukocytosis, hypokalemia and hypomagnesemia.   Review of Systems: As mentioned in the history of present illness. All other systems reviewed and are negative. Past Medical History:  Diagnosis Date   Asthma    Chronic pelvic pain in female    COPD (chronic obstructive pulmonary disease) (HCC)    Endometriosis    Polysubstance abuse (HCC)    marijuana, cocaine, benzos   Stomach ulcer    Suicide attempt (HCC) 2008   by phenergan overdose   Syncope    Past Surgical History:  Procedure Laterality Date   ABDOMINAL HYSTERECTOMY     BALLOON DILATION N/A 07/26/2020   Procedure: BALLOON DILATION;  Surgeon: Carver, Charles K, DO;  Location: [facility];   Service: Endoscopy;  Laterality: N/A;   BIOPSY  07/26/2020   Procedure: BIOPSY;  Surgeon: Carver, Charles K, DO;  Location: [facility];  Service: Endoscopy;;   BIOPSY  11/14/2021   Procedure: BIOPSY;  Surgeon: Gessner, Carl E, MD;  Location: [facility];  Service: Gastroenterology;;   CERVICAL CONE BIOPSY     cervical cancer   CESAREAN SECTION  1993   CHOLECYSTECTOMY     COLONOSCOPY WITH PROPOFOL N/A 07/26/2020   Procedure: COLONOSCOPY WITH PROPOFOL;  Surgeon: Carver, Charles K, DO;  Location: [facility];  Service: Endoscopy;  Laterality: N/A;  12:00pm   ESOPHAGOGASTRODUODENOSCOPY (EGD) WITH PROPOFOL N/A 07/26/2020   Procedure: ESOPHAGOGASTRODUODENOSCOPY (EGD) WITH PROPOFOL;  Surgeon: Carver, Charles K, DO;  Location: [facility];  Service: Endoscopy;  Laterality: N/A;   ESOPHAGOGASTRODUODENOSCOPY (EGD) WITH PROPOFOL N/A 11/14/2021   Procedure: ESOPHAGOGASTRODUODENOSCOPY (EGD) WITH PROPOFOL;  Surgeon: Gessner, Carl E, MD;  Location: [facility];  Service: Gastroenterology;  Laterality: N/A;   RADICAL HYSTERECTOMY WITH TRANSPOSITION OF OVARIES     TOOTH EXTRACTION     TUBAL LIGATION  1993   Social History:  reports that she has been smoking cigarettes. She has a 13.50 pack-year smoking history. She has never used smokeless tobacco. She reports that she does not currently use alcohol. She reports current drug use. Frequency: 7.00 times per week. Drug: Marijuana.  Allergies  Allergen Reactions   Asa [Aspirin] Anaphylaxis, Hives and Swelling   Codeine Nausea Only    Family History  Problem Relation Age of Onset   Heart disease Mother    Hypertension Mother    Diabetes Mother      Rheum arthritis Mother    Heart disease Father    Hypertension Father    Heart attack Sister    Stroke Sister    Dementia Sister    Heart attack Maternal Uncle    Heart attack Maternal Uncle    COPD Paternal Grandfather    Colon cancer Neg Hx     Prior to Admission medications   Medication Sig  Start Date End Date Taking? Authorizing Provider  acetaminophen (TYLENOL) 500 MG tablet Take 1,500 mg by mouth every 6 (six) hours as needed for moderate pain.   Yes [provider]  albuterol (PROVENTIL) (2.5 MG/3ML) 0.083% nebulizer solution INHALE 1 VIAL VIA NEBULIZER EVERY 6 HOURS AS NEEDED FOR WHEEZING OR SHORTNESS OF BREATH Patient taking differently: Take 2.5 mg by nebulization every 6 (six) hours as needed for wheezing or shortness of breath. 10/23/21  Yes Soyla Dryer, PA-C  albuterol (VENTOLIN HFA) 108 (90 Base) MCG/ACT inhaler INHALE 2 PUFFS BY MOUTH EVERY 6 HOURS AS NEEDED FOR COUGHING, WHEEZING, OR SHORTNESS OF BREATH Patient taking differently: Inhale 2 puffs into the lungs every 6 (six) hours as needed for shortness of breath or wheezing. 10/23/21  Yes Soyla Dryer, PA-C  atorvastatin (LIPITOR) 20 MG tablet TAKE 1 Tablet BY MOUTH ONCE DAILY Patient taking differently: Take 20 mg by mouth daily. 10/23/21  Yes Soyla Dryer, PA-C  diphenhydrAMINE (BENADRYL) 25 MG tablet Take 50 mg by mouth at bedtime.   Yes [provider]  fluticasone-salmeterol (WIXELA INHUB) 100-50 MCG/ACT AEPB Inhale 1 puff into the lungs 2 (two) times daily.   Yes [provider]  guaiFENesin (MUCINEX) 600 MG 12 hr tablet Take 600 mg by mouth daily.   Yes [provider]  loratadine-pseudoephedrine (CLARITIN-D 24-HOUR) 10-240 MG 24 hr tablet Take 1 tablet by mouth in the morning.    Yes [provider]  montelukast (SINGULAIR) 10 MG tablet TAKE 1 Tablet BY MOUTH ONCE DAILY AS NEEDED Patient taking differently: Take 10 mg by mouth daily at 6 (six) AM. 10/23/21  Yes Soyla Dryer, PA-C  pantoprazole (PROTONIX) 40 MG tablet Take 1 tablet (40 mg total) by mouth 2 (two) times daily. 11/15/21 12/15/21 Yes Pahwani, Einar Grad, MD  nitroGLYCERIN (NITROSTAT) 0.4 MG SL tablet Place 1 tablet (0.4 mg total) under the tongue every 5 (five) minutes as needed for chest pain. 10/02/18    Soyla Dryer, PA-C    Physical Exam: Vitals:   11/20/21 1130 11/20/21 1332 11/20/21 1359 11/20/21 1439  BP: (!) 155/84 (!) 143/83 125/87 (!) 150/88  Pulse: (!) 101   78  Resp: (!) 21   18  Temp:    (!) 97.4 F (36.3 C)  TempSrc:    Oral  SpO2: 95% 96%  97%  Weight:    72.4 kg  Height:    4' 11" (1.499 m)   General exam: Alert, awake, oriented x 3; reporting general malaise, intermittent coughing spells unsure when the sensation. Respiratory system: Positive rhonchi bilaterally; no expiratory wheezing.  No using accessory muscles. Cardiovascular system:RRR. No rubs or gallops; no JVD. Gastrointestinal system: Abdomen is nondistended, soft and reporting vague diffuse tenderness in her abdomen. No organomegaly or masses felt. Normal bowel sounds heard. Central nervous system: Alert and oriented. No focal neurological deficits. Extremities: No cyanosis or clubbing. Skin: No petechiae. Psychiatry: Judgement and insight appear normal. Mood & affect appropriate.   Data Reviewed: Comprehensive metabolic panel: Sodium 619, potassium 3.3, chloride 100, bicarb 20, BUN 13, creatinine 0.64, albumin 2.9, alk  phos 137; GFR more than 60. CBC: WBCs 11.9, hemoglobin 12.1, platelet count 640 K High sensitive troponin: 5>>4 Magnesium: 1.5 Urinalysis: With negative nitrite and negative leukocytes; positive hemoglobin urine dipstick.  Assessment and Plan: * Empyema lung ([place]) - Patient presenting with shortness of breath, intermittent coughing spells and ongoing abdominal discomfort with nausea and vomiting. -Work-up not demonstrating what appears to be worsening pleural effusion with development of empyema -Mild leukocytosis and no fever appreciated at time of admission; patient reporting chills at home along with general malaise -After discussing with emergency department physician radiologist was involved for thoracentesis -Zosyn has been started -Follow cultures results, fluid analysis and  clinical response -Will provide as needed bronchodilators, fluid resuscitation and supportive care.  Nausea, vomiting, and diarrhea - With recent admission for similar symptoms -Patient expressed diarrhea has now subsided since 11/18/2021; but continued having nausea/vomiting -Clear liquid diet will be allowed -Provide antiemetics -As needed Reglan for refractory symptoms -Provide fluid resuscitation and supportive care. -CT abdomen and pelvis demonstrating acute abnormality to explain Patient's symptoms.  Hypokalemia - In the setting of GI losses -Will check magnesium and phosphorus level -Replete electrolytes and follow trend.  Chronic obstructive pulmonary disease (HCC) - Currently no wheezing -Continue the use of Singulair daily and resume the use of Pulmicort. -Continue as needed bronchodilators.  Obesity (BMI 30-39.9) -Body mass index is 32.24 kg/m. -Low calorie diet and portion control discussed with patient.  Hyperlipidemia - Continue Lipitor.  Esophageal reflux - Continue PPI.  Cigarette nicotine dependence, uncomplicated - Cessation counseling provided -Nicotine patch ordered  Hypomagnesemia: -Will replete magnesium and follow trend.    Advance Care Planning:   Code Status: Full Code   Consults: Radiologist for thoracentesis.  Family Communication: No family at bedside.  Severity of Illness: The appropriate patient status for this patient is INPATIENT. Inpatient status is judged to be reasonable and necessary in order to provide the required intensity of service to ensure the patient's safety. The patient's presenting symptoms, physical exam findings, and initial radiographic and laboratory data in the context of their chronic comorbidities is felt to place them at high risk for further clinical deterioration. Furthermore, it is not anticipated that the patient will be medically stable for discharge from the hospital within 2 midnights of admission.   * I  certify that at the point of admission it is my clinical judgment that the patient will require inpatient hospital care spanning beyond 2 midnights from the point of admission due to high intensity of service, high risk for further deterioration and high frequency of surveillance required.*  Author: Barton Dubois, MD 11/20/2021 6:09 PM  For on call review [email or web address].

## 2021-11-20 NOTE — Assessment & Plan Note (Addendum)
-  Patient presenting with shortness of breath, intermittent coughing spells and ongoing abdominal discomfort with nausea and vomiting. -Work-up not demonstrating what appears to be worsening pleural effusion with development of empyema. -Status post thoracentesis -Fluid analysis pending -Continue current antibiotics and the use of flutter valve. -Follow clinical response. -Patient is overall feeling better. -Good saturation on room air appreciated.

## 2021-11-20 NOTE — Telephone Encounter (Signed)
Pt was called back- she left 2 voicemails on the office machine over the weekend.  Pt says she is still no better from when she was in hospital last week (with discharge on Oct 25).  She says her bp yesterday was 184/99 and this morning was 157/89.  She says she is still throwing up.  Her last episode of diarrhea was on Saturday.    Pt says she has never been this sick in her life and she feels like she is dying.  Pt went to ER yesterday and was told wait would be 19 hours so she left and went home.  Discussed with pt that her labs drawn yesterday were abnormal (K+ 2.7) and that she really needed to go back to the ER.  Discussed that the ER can treat her emesis and low K+ and give her fluids if needed and make sure she is stable.  Discussed that ER can decide if she needs to be re-admitted.  Pt unsure if she will go to [facility] or [facility] but says she will go back to ER.

## 2021-11-20 NOTE — Progress Notes (Signed)
Thoracentesis complete 430 ml of pleural fluid removed. Patient taken for post thoracentesis chest xray.

## 2021-11-20 NOTE — Assessment & Plan Note (Signed)
-  Currently no wheezing -Continue the use of Singulair daily and resume the use of Pulmicort. -will also Continue as needed bronchodilators.

## 2021-11-20 NOTE — ED Triage Notes (Signed)
Pt from home with reports of weakness x 2 weeks Pt c/o pain to the right side of her chest. Pt was told to come to ER due to potassium of 2.3.

## 2021-11-20 NOTE — Procedures (Signed)
PreOperative Dx: Pleural effusion, right  Postoperative Dx: Pleural effusion, right  Procedure:   US guided RIGHT thoracentesis Radiologist:  Thornton Papas Anesthesia:  10 ml of 1% lidocaine Specimen:  430 mL of clear yellow colored fluid EBL:   < 1 ml Complications:  None

## 2021-11-20 NOTE — Assessment & Plan Note (Signed)
-  Continue Lipitor -Heart healthy diet discussed with patient. 

## 2021-11-20 NOTE — Assessment & Plan Note (Signed)
-   With recent admission for similar symptoms. -Reporting having some loose stools overnight after antibiotics initiated. -No abdominal pains or vomiting.  Tolerating clear liquids. -Starting Florastor and will start advancing diet to soft consistency. -Continue to maintain adequate hydration and follow clinical response. -CT abdomen and pelvis demonstrating acute abnormality to explain Patient's symptoms.

## 2021-11-20 NOTE — Assessment & Plan Note (Signed)
-  In the setting of GI losses -Continue to replete electrolytes as needed -Potassium 3.2 currently.

## 2021-11-20 NOTE — Assessment & Plan Note (Signed)
-  Body mass index is 32.24 kg/m. -Low calorie diet and portion control discussed with patient.

## 2021-11-20 NOTE — Assessment & Plan Note (Signed)
Continue PPI ?

## 2021-11-20 NOTE — ED Provider Notes (Signed)
[facility] EMERGENCY DEPARTMENT Provider Note   CSN: [id number] Arrival date & time: 11/20/21  7482     History {Add pertinent medical, surgical, social history, OB history to HPI:1} Chief Complaint  Patient presents with   Weakness    Pamela Livingston is a 56 y.o. female.  56 year old female with a history of cocaine and marijuana abuse, COPD, and endometriosis who presents emergency department with nausea and vomiting.  Patient reports that for the past 2 weeks she has had innumerable episodes of nausea and vomiting.  Describes it as yellow and is nonbloody nonbilious.  Says that she is also having a significant amount of diarrhea until Saturday when it stopped.  Says that she has had a mild amount of abdominal pain that is diffuse and started after the nausea and vomiting.  Also reports that the same time that she started having nausea and vomiting she started experiencing right-sided pleuritic pressure-like chest pain.  Unsure if it is exertional or positional.  Reports history of cholecystectomy and hysterectomy.  Was hospitalized from 10/23 to 11/15/2021 for similar symptoms.  Did have a CTA of the chest that did not show evidence of PE or other acute abnormality.  Had a CT of the abdomen pelvis that did not show any acute abnormality.  She reports a hysterectomy but still had her uterus on that CT scan.  She was reporting that she had black stools and had stool that was Hemoccult positive in the setting of taking Pepto-Bismol.  Had EGD on 11/14/2021 that showed gastric polyp that was biopsied and is pending results.  Says that after she was discharged she has had persistent nausea and vomiting so she went to [facility] emergency department yesterday and had labs checked that showed a potassium of 2.7.  She was then referred into the emergency department today for additional evaluation.  Reports that because of this she has lost approximately 10 pounds.  Denies any marijuana use in the last  week.  Says that she has not drank alcohol in over a year.   Past Medical History:  Diagnosis Date   Asthma    Chronic pelvic pain in female    COPD (chronic obstructive pulmonary disease) ([place])    Endometriosis    Polysubstance abuse ([facility])    marijuana, cocaine, benzos   Stomach ulcer    Suicide attempt ([place]) 2008   by phenergan overdose   Syncope       Home Medications Prior to Admission medications   Medication Sig Start Date End Date Taking? Authorizing Provider  acetaminophen (TYLENOL) 500 MG tablet Take 1,500 mg by mouth every 6 (six) hours as needed for moderate pain.    [provider]  albuterol (PROVENTIL) (2.5 MG/3ML) 0.083% nebulizer solution INHALE 1 VIAL VIA NEBULIZER EVERY 6 HOURS AS NEEDED FOR WHEEZING OR SHORTNESS OF BREATH Patient taking differently: Take 2.5 mg by nebulization every 6 (six) hours as needed for wheezing or shortness of breath. 10/23/21   Soyla Dryer, PA-C  albuterol (VENTOLIN HFA) 108 (90 Base) MCG/ACT inhaler INHALE 2 PUFFS BY MOUTH EVERY 6 HOURS AS NEEDED FOR COUGHING, WHEEZING, OR SHORTNESS OF BREATH Patient taking differently: Inhale 2 puffs into the lungs every 6 (six) hours as needed for shortness of breath or wheezing. 10/23/21   Soyla Dryer, PA-C  atorvastatin (LIPITOR) 20 MG tablet TAKE 1 Tablet BY MOUTH ONCE DAILY Patient taking differently: Take 20 mg by mouth daily. 10/23/21   Soyla Dryer, PA-C  cyclobenzaprine (FLEXERIL) 10  MG tablet Take 1 tablet (10 mg total) by mouth 3 (three) times daily as needed for muscle spasms. Patient not taking: Reported on 09/13/2021 07/18/21   Soyla Dryer, PA-C  diphenhydrAMINE (BENADRYL) 25 MG tablet Take 50 mg by mouth at bedtime.    [provider]  fluticasone-salmeterol (WIXELA INHUB) 100-50 MCG/ACT AEPB Inhale 1 puff into the lungs 2 (two) times daily.    [provider]  guaiFENesin (MUCINEX) 600 MG 12 hr tablet Take 600 mg by mouth daily.    [provider]  lisinopril (ZESTRIL) 20 MG tablet TAKE 1 Tablet BY MOUTH ONCE DAILY Patient taking differently: Take 20 mg by mouth daily. 10/23/21   Soyla Dryer, PA-C  loratadine-pseudoephedrine (CLARITIN-D 24-HOUR) 10-240 MG 24 hr tablet Take 1 tablet by mouth in the morning.     [provider]  montelukast (SINGULAIR) 10 MG tablet TAKE 1 Tablet BY MOUTH ONCE DAILY AS NEEDED Patient taking differently: Take 10 mg by mouth daily as needed (asthma). 10/23/21   Soyla Dryer, PA-C  nitroGLYCERIN (NITROSTAT) 0.4 MG SL tablet Place 1 tablet (0.4 mg total) under the tongue every 5 (five) minutes as needed for chest pain. 10/02/18   Soyla Dryer, PA-C  pantoprazole (PROTONIX) 40 MG tablet Take 1 tablet (40 mg total) by mouth 2 (two) times daily. 11/15/21 12/15/21  Darliss Cheney, MD      Allergies    Asa [aspirin] and Codeine    Review of Systems   Review of Systems  Physical Exam Updated Vital Signs BP (!) 136/97   Pulse 85   Temp 98.2 F (36.8 C) (Oral)   Resp 16   SpO2 98%  Physical Exam Vitals and nursing note reviewed.  Constitutional:      General: She is not in acute distress.    Appearance: She is well-developed.  HENT:     Head: Normocephalic and atraumatic.     Right Ear: External ear normal.     Left Ear: External ear normal.     Nose: Nose normal.  Eyes:     Extraocular Movements: Extraocular movements intact.     Conjunctiva/sclera: Conjunctivae normal.     Pupils: Pupils are equal, round, and reactive to light.  Cardiovascular:     Rate and Rhythm: Normal rate and regular rhythm.     Heart sounds: No murmur heard. Pulmonary:     Effort: Pulmonary effort is normal. No respiratory distress.     Breath sounds: Normal breath sounds.  Abdominal:     General: Abdomen is flat. There is no distension.     Palpations: Abdomen is soft. There is no mass.     Tenderness: There is abdominal tenderness (Epigastric). There is no guarding.  Musculoskeletal:         General: No swelling.     Cervical back: Normal range of motion and neck supple.     Right lower leg: No edema.     Left lower leg: No edema.  Skin:    General: Skin is warm and dry.     Capillary Refill: Capillary refill takes more than 3 seconds.  Neurological:     Mental Status: She is alert and oriented to person, place, and time. Mental status is at baseline.  Psychiatric:        Mood and Affect: Mood normal.     ED Results / Procedures / Treatments   Labs (all labs ordered are listed, but only abnormal results are displayed) Labs Reviewed - No  data to display  EKG None  Radiology DG Chest 2 View  Result Date: 11/19/2021 CLINICAL DATA:  Chest pain EXAM: CHEST - 2 VIEW COMPARISON:  11/09/2021 FINDINGS: Layering right pleural effusion with right lower lobe atelectasis or infiltrate. No confluent opacity on the left. Heart is borderline in size. No acute bony abnormality. IMPRESSION: Right pleural effusion with right lower lobe atelectasis or infiltrate. Electronically Signed   By: Rolm Baptise M.D.   On: 11/19/2021 21:04    Procedures Procedures   Medications Ordered in ED Medications - No data to display  ED Course/ Medical Decision Making/ A&P                           Medical Decision Making  Pamela Livingston is a 56 y.o. female with comorbidities that complicate the patient evaluation including marijuana use, cocaine use, benzodiazepine use, cholecystectomy who presents with chief complaint of nausea and vomiting and chest pain.  This patient presents to the ED for concern of complaints listed in HPI, this involves an extensive number of treatment options, and is a complaint that carries with it a high risk of complications and morbidity. Disposition including potential need for admission considered.   Initial Ddx:  MI, PE, symptomatic anemia, gastritis, pancreatitis, cholecystitis, cannabinoid induced hyperemesis, gastroenteritis  MDM:  Feel the patient  initially was having a bout of gastroenteritis given her symptoms and recent work-up from her hospitalization.  Patient is still tender at this time does not have any rebound or guarding.  Feel that cannabinoid hyperemesis could also be at play.  No risk factors at this time for pancreatitis and she has had her gallbladder removed and is not drinking alcohol.  With her chest pain initially was concerned about pulmonary embolism but the CTA that she had having this pain is reassuring.  Will obtain EKGs and troponin to evaluate for MI but her troponin yesterday was WNL.  Plan:  Labs Lipase Troponin Fluids EKG Zofran  ED Summary/Re-evaluation:  ***  Dispo: {Disposition:28069}   Additional history obtained from {Additional History:28067} Records reviewed {Records Reviewed:28068} The following labs were independently interpreted: {labs interpreted:28064} and show {lab findings:28250} I independently reviewed the following imaging with scope of interpretation limited to determining acute life threatening conditions related to emergency care: {imaging interpreted:28065}, which revealed {No acute abnormality:28066}  I personally reviewed and interpreted cardiac monitoring: {cardiac monitoring:28251} I personally reviewed and interpreted the pt's EKG: see above for interpretation  I have reviewed the patients home medications and made adjustments as needed Consults: {Consultants:28063} Social Determinants of health:  ***  Final Clinical Impression(s) / ED Diagnoses Final diagnoses:  None    Rx / DC Orders ED Discharge Orders     None

## 2021-11-20 NOTE — Assessment & Plan Note (Signed)
-  Cessation counseling provided -Nicotine patch ordered. 

## 2021-11-21 LAB — CBC
HCT: 30.8 % — ABNORMAL LOW (ref 36.0–46.0)
Hemoglobin: 10.3 g/dL — ABNORMAL LOW (ref 12.0–15.0)
MCH: 31.5 pg (ref 26.0–34.0)
MCHC: 33.4 g/dL (ref 30.0–36.0)
MCV: 94.2 fL (ref 80.0–100.0)
Platelets: 625 10*3/uL — ABNORMAL HIGH (ref 150–400)
RBC: 3.27 MIL/uL — ABNORMAL LOW (ref 3.87–5.11)
RDW: 12.8 % (ref 11.5–15.5)
WBC: 7 10*3/uL (ref 4.0–10.5)
nRBC: 0 % (ref 0.0–0.2)

## 2021-11-21 LAB — BASIC METABOLIC PANEL
Anion gap: 9 (ref 5–15)
BUN: 8 mg/dL (ref 6–20)
CO2: 23 mmol/L (ref 22–32)
Calcium: 8.1 mg/dL — ABNORMAL LOW (ref 8.9–10.3)
Chloride: 104 mmol/L (ref 98–111)
Creatinine, Ser: 0.62 mg/dL (ref 0.44–1.00)
GFR, Estimated: >60 mL/min (ref >60)
Glucose, Bld: 80 mg/dL (ref 70–99)
Potassium: 3.2 mmol/L — ABNORMAL LOW (ref 3.5–5.1)
Sodium: 136 mmol/L (ref 135–145)

## 2021-11-21 MED ORDER — PSEUDOEPHEDRINE HCL 60 MG PO TABS
60.0000 mg | ORAL_TABLET | Freq: Four times a day (QID) | ORAL | Status: DC
  Administered 2021-11-21 – 2021-11-24 (×13): 60 mg via ORAL
  Filled 2021-11-21 (×26): qty 1

## 2021-11-21 MED ORDER — SACCHAROMYCES BOULARDII 250 MG PO CAPS
250.0000 mg | ORAL_CAPSULE | Freq: Two times a day (BID) | ORAL | Status: DC
  Administered 2021-11-21 – 2021-11-24 (×6): 250 mg via ORAL
  Filled 2021-11-21 (×6): qty 1

## 2021-11-21 MED ORDER — POTASSIUM CHLORIDE CRYS ER 20 MEQ PO TBCR
40.0000 meq | EXTENDED_RELEASE_TABLET | ORAL | Status: AC
  Administered 2021-11-21 – 2021-11-22 (×3): 40 meq via ORAL
  Filled 2021-11-21 (×3): qty 2

## 2021-11-21 NOTE — Progress Notes (Signed)
Progress Note   Patient: Pamela Livingston PPI:[id number] DOB: [birth year] DOA: 11/20/2021     1 DOS: the patient was seen and examined on 11/21/2021   Brief hospital course: YUMIKO ALKINS is a 56 y.o. female with medical history significant of gastroesophageal reflux disease, COPD/asthma, history of marijuana abuse and recent admission to the hospital (approximately 5 days ago) secondary to nausea/vomiting and abdominal pain with diarrhea.  Patient reports symptoms never resolving at time of her discharge except for diarrhea by 11/18/2021.  She presented now with general malaise, new development of pleuritic chest discomfort, coughing spells and shortness of breath with activity.  Patient also reported some chills but denies frank fever. Patient's chest pain present intermittently and mainly associated with coughing spells and deep breath. She had continue experiencing intermittent episode of nausea and vomiting at times making it difficult to keep things down and take her meds.   In the ED work-up demonstrating chest x-ray with moderate pleural effusion, with concerns for developing empyema and multifocal pneumonia.  Patient has mild leukocytosis, hypokalemia and hypomagnesemia.  Assessment and Plan: * Empyema lung ([place]) -Patient presenting with shortness of breath, intermittent coughing spells and ongoing abdominal discomfort with nausea and vomiting. -Work-up not demonstrating what appears to be worsening pleural effusion with development of empyema. -Status post thoracentesis -Fluid analysis pending -Continue current antibiotics and the use of flutter valve. -Follow clinical response. -Patient is overall feeling better. -Good saturation on room air appreciated.  Nausea, vomiting, and diarrhea - With recent admission for similar symptoms. -Reporting having some loose stools overnight after antibiotics initiated. -No abdominal pains or vomiting.  Tolerating clear liquids. -Starting Florastor  and will start advancing diet to soft consistency. -Continue to maintain adequate hydration and follow clinical response. -CT abdomen and pelvis demonstrating acute abnormality to explain Patient's symptoms.  Hypokalemia -In the setting of GI losses -Continue to replete electrolytes as needed -Potassium 3.2 currently.  Chronic obstructive pulmonary disease (HCC) -Currently no wheezing -Continue the use of Singulair daily and resume the use of Pulmicort. -will also Continue as needed bronchodilators.  Obesity (BMI 30-39.9) -Body mass index is 32.24 kg/m. -Low calorie diet and portion control discussed with patient.  Hyperlipidemia -Continue Lipitor. -Heart healthy diet discussed with patient.  Esophageal reflux -Continue PPI.  Cigarette nicotine dependence, uncomplicated -Cessation counseling provided -Nicotine patch ordered    Subjective:  Afebrile, reporting feeling slightly better and no longer having vomiting.  Still short winded with activity and complaining of discomfort in the area where thoracentesis was done.  Physical Exam: Vitals:   11/21/21 0548 11/21/21 0725 11/21/21 1140 11/21/21 1434  BP: 139/75  (!) 144/85 (!) 163/88  Pulse: 82 86 79 76  Resp: '20 14 18 18  '$ Temp: 97.8 F (36.6 C)  (!) 97.5 F (36.4 C) (!) 97.4 F (36.3 C)  TempSrc: Oral  Axillary Axillary  SpO2: 96% 94% 98% 99%  Weight:      Height:       General exam: Alert, awake, oriented x 3; reports feeling better and no longer having episodes of vomiting.  Has tolerated clear liquid diet.  Reporting pain in her back lateral aspect from thoracentesis. Respiratory system: Positive rhonchi, no wheezing, good saturation on room air. Cardiovascular system:RRR. No murmurs, rubs, gallops.  No JVD. Gastrointestinal system: Abdomen is obese, nondistended, soft and nontender. No organomegaly or masses felt. Normal bowel sounds heard. Central nervous system: Alert and oriented. No focal neurological  deficits. Extremities: No cyanosis or clubbing. Skin: No  petechiae. Psychiatry: Judgement and insight appear normal. Mood & affect appropriate.   Data Reviewed: CBC: WBC 7.0, hemoglobin 10.3, platelet count 014 5K Basic metabolic panel: Sodium 840, potassium 3.2, chloride 104, bicarb 23, BUN 8, creatinine 0.62; anion gap 9.  Family Communication: No family at bedside.  Disposition: Status is: Inpatient Remains inpatient appropriate because: Continue IV antibiotic therapy.   Planned Discharge Destination: Home  Time spent: 35 minutes  Author: Barton Dubois, MD 11/21/2021 4:38 PM  For on call review [email or web address].

## 2021-11-21 NOTE — TOC Initial Note (Signed)
Transition of Care [facility]) - Initial/Assessment Note    Patient Details  Name: Pamela Livingston MRN: [id number] Date of Birth: August 15, 1965  Transition of Care [facility]) CM/SW Contact:    Salome Arnt, [place] Phone Number: 11/21/2021, 8:36 AM  Clinical Narrative: Assessment completed due to high risk readmission score. Pt reports she lives with a friend. She is independent with ADLs. Pt reports she sometimes uses a cane. She has transportation to appointments and has PCP. Pt plans to return home when medically stable. Pt does not have insurance. She indicates she gets her medications at no cost through a program her PCP set up for her. TOC will continue to follow.                    Expected Discharge Plan: Home/Self Care Barriers to Discharge: Continued Medical Work up   Patient Goals and CMS Choice Patient states their goals for this hospitalization and ongoing recovery are:: return home   Choice offered to / list presented to : Patient  Expected Discharge Plan and Services Expected Discharge Plan: Home/Self Care In-house Referral: Clinical Social Work     Living arrangements for the past 2 months: Single Family Home Expected Discharge Date: 11/22/21                                    Prior Living Arrangements/Services Living arrangements for the past 2 months: Single Family Home Lives with:: Friends Patient language and need for interpreter reviewed:: Yes Do you feel safe going back to the place where you live?: Yes      Need for Family Participation in Patient Care: No (Comment)   Current home services: DME (cane) Criminal Activity/Legal Involvement Pertinent to Current Situation/Hospitalization: No - Comment as needed  Activities of Daily Living Home Assistive Devices/Equipment: None ADL Screening (condition at time of admission) Patient's cognitive ability adequate to safely complete daily activities?: No Is the patient deaf or have difficulty hearing?:  No Does the patient have difficulty seeing, even when wearing glasses/contacts?: No Does the patient have difficulty concentrating, remembering, or making decisions?: No Patient able to express need for assistance with ADLs?: Yes Does the patient have difficulty dressing or bathing?: No Independently performs ADLs?: No Communication: Independent Dressing (OT): Independent Grooming: Independent Feeding: Independent Bathing: Needs assistance Is this a change from baseline?: Change from baseline, expected to last <3 days Toileting: Independent In/Out Bed: Independent Walks in Home: Independent (would prefer to use walker at this time due to gen weakness) Does the patient have difficulty walking or climbing stairs?: Yes Weakness of Legs: Both Weakness of Arms/Hands: Both  Permission Sought/Granted                  Emotional Assessment     Affect (typically observed): Appropriate Orientation: : Oriented to Self, Oriented to Place, Oriented to  Time, Oriented to Situation Alcohol / Substance Use: Not Applicable Psych Involvement: No (comment)  Admission diagnosis:  Empyema lung ([place]) [J86.9] Patient Active Problem List   Diagnosis Date Noted   Empyema lung ([place]) 11/20/2021   GIB (gastrointestinal bleeding) 11/14/2021   Persistent vomiting 11/14/2021   Black stools 11/14/2021   Heme + stool 11/14/2021   Gastric polyp 11/14/2021   GI bleed 11/13/2021   Obesity (BMI 30-39.9) 11/13/2021   Acute blood loss anemia 11/13/2021   Leukocytosis 11/13/2021   Nausea, vomiting, and diarrhea 11/13/2021   Hypokalemia  11/13/2021   Bilateral hand pain 09/20/2020   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   Pain in left leg 09/20/2020   Esophageal dysphagia 09/30/2019   RUQ pain 05/27/2019   GERD (gastroesophageal reflux disease) 05/27/2019   Constipation 05/27/2019   Rectal bleeding 05/27/2019   Cigarette nicotine dependence with nicotine-induced disorder 04/15/2015   Chronic obstructive  pulmonary disease ([place]) 04/15/2015   Hyperlipidemia 04/15/2015   Abnormal mammogram 04/15/2015   Hematuria 03/07/2015   Abdominal pain, epigastric 03/07/2015   Cigarette nicotine dependence, uncomplicated 82/95/6213   History of substance abuse ([place]) 03/07/2015   Esophageal reflux 03/07/2015   PCP:  Soyla Dryer, PA-C Pharmacy:   [facility] [facility], [place] - [address] [place] [address] Phone: [phone] Fax: [phone]  [facility], [address], [place], [place] [address] Phone: [phone] Fax: [phone]     Social Determinants of Health (SDOH) Interventions    Readmission Risk Interventions    11/21/2021    8:34 AM  Readmission Risk Prevention Plan  Transportation Screening Complete  Home Care Screening Complete  Medication Review (RN CM) Complete

## 2021-11-21 NOTE — Progress Notes (Signed)
Patient upgraded to dys 3 diet asked for cream of chicken soup tolerated well. Still receiving prn med q4 for right flank pain. Still receiving IVB ABT.

## 2021-11-22 ENCOUNTER — Ambulatory Visit: Payer: Medicaid Other | Admitting: Physician Assistant

## 2021-11-22 DIAGNOSIS — J90 Pleural effusion, not elsewhere classified: Secondary | ICD-10-CM

## 2021-11-22 LAB — CBC
HCT: 32.1 % — ABNORMAL LOW (ref 36.0–46.0)
Hemoglobin: 11.3 g/dL — ABNORMAL LOW (ref 12.0–15.0)
MCH: 32.7 pg (ref 26.0–34.0)
MCHC: 35.2 g/dL (ref 30.0–36.0)
MCV: 92.8 fL (ref 80.0–100.0)
Platelets: 701 10*3/uL — ABNORMAL HIGH (ref 150–400)
RBC: 3.46 MIL/uL — ABNORMAL LOW (ref 3.87–5.11)
RDW: 12.8 % (ref 11.5–15.5)
WBC: 7.9 10*3/uL (ref 4.0–10.5)
nRBC: 0 % (ref 0.0–0.2)

## 2021-11-22 LAB — BASIC METABOLIC PANEL
Anion gap: 11 (ref 5–15)
BUN: <5 mg/dL — ABNORMAL LOW (ref 6–20)
CO2: 21 mmol/L — ABNORMAL LOW (ref 22–32)
Calcium: 8.4 mg/dL — ABNORMAL LOW (ref 8.9–10.3)
Chloride: 104 mmol/L (ref 98–111)
Creatinine, Ser: 0.53 mg/dL (ref 0.44–1.00)
GFR, Estimated: >60 mL/min (ref >60)
Glucose, Bld: 85 mg/dL (ref 70–99)
Potassium: 3.9 mmol/L (ref 3.5–5.1)
Sodium: 136 mmol/L (ref 135–145)

## 2021-11-22 LAB — MAGNESIUM: Magnesium: 1.9 mg/dL (ref 1.7–2.4)

## 2021-11-22 NOTE — Consult Note (Addendum)
NAME:  Pamela Livingston, MRN:  [id number], DOB:  [birth year], LOS: 2 ADMISSION DATE:  11/20/2021, CONSULTATION DATE:  11/22/2021  REFERRING MD:  Tat, [facility], CHIEF COMPLAINT: Diarrhea, pleural effusion  History of Present Illness:  56 year old smoker presented with nausea vomiting and diarrhea ongoing for 3 weeks.  She presented 10/23 with right-sided chest pain and emesis and was hospitalized CT angiogram chest showed small right pleural effusion, CT abdomen was negative She had mild leukocytosis 15.6, hemoglobin 11.8 with fecal occult blood positive. She underwent EGD 10/24 which showed gastric mucosal erythema and gastric polyp which was biopsied She will return on 10/31 with right-sided chest pain, pleuritic and intermittent nausea and vomiting. Chest x-ray showed increased right effusion.  CT chest/abdomen/pelvis showed right basilar airspace disease with large right effusion that appeared loculated.  There was pleural enhancement to suggest empyema.  6 mm left upper lobe nodule was noted She underwent thoracentesis with removal of 430 cc of clear amber-yellow fluid  Pertinent  Medical History  "COPD" Marijuana abuse  Significant Hospital Events: Including procedures, antibiotic start and stop dates in addition to other pertinent events   10/30 right thoracentesis >> lymphocytic exudate  Interim History / Subjective:  Complains of right-sided pleuritic chest pain Afebrile Feels 50% improved since hospital admission Complaints of loose stools  Objective   Blood pressure 133/76, pulse 81, temperature 98.3 F (36.8 C), temperature source Oral, resp. rate 20, height '4\' 11"'$  (1.499 m), weight 72.4 kg, SpO2 96 %.        Intake/Output Summary (Last 24 hours) at 11/22/2021 1542 Last data filed at 11/22/2021 1500 Gross per 24 hour  Intake 720 ml  Output --  Net 720 ml   Filed Weights   11/20/21 1439  Weight: 72.4 kg    Examination: General: Middle-aged woman, lying supine in bed, no  distress HENT: Mild pallor, no icterus, no JVD, no lymphadenopathy Lungs: Decreased breath sounds right base, clear on left, no accessory muscle use Cardiovascular: S1-S2 regular, no murmur Abdomen:, Soft nontender, no guarding no hepatosplenomegaly Extremities: No deformity, no edema Neuro: Alert oriented, nonfocal  Labs show normal electrolytes, no leukocytosis, hemoglobin 11.3   Resolved Hospital Problem list     Assessment & Plan:  Acute development of right pleural effusion, increased in size compared to 10/24, now loculated -thoracentesis shows lymphocytic exudate , possibly viral etiology but loculation concerning for bacterial -She appears to have clinically improved but still has some residual pleuritic pain -We will repeat chest x-ray and if still has significant effusion then consider IR placement of chest tube with lytics  I discussed options with the patient including antibiotics alone, chest tube with or without lytics or surgery/VATS  Left upper lobe pulmonary nodule -will need 39-monthfollow-up CT chest  COPD/tobacco abuse -PFTs as outpatient once active issues resolved  Best Practice (right click and "Reselect all SmartList Selections" daily)   Per [facility] Code Status:  full code Last date of multidisciplinary goals of care discussion [NA]  Labs   CBC: Recent Labs  Lab 11/19/21 2035 11/20/21 0959 11/20/21 1512 11/21/21 0307 11/22/21 0409  WBC 12.1* 11.9* 10.1 7.0 7.9  NEUTROABS  --  8.7*  --   --   --   HGB 11.5* 12.1 11.1* 10.3* 11.3*  HCT 32.7* 36.0 32.7* 30.8* 32.1*  MCV 91.6 93.0 92.4 94.2 92.8  PLT 681* 640* 671* 625* 701*    Basic Metabolic Panel: Recent Labs  Lab 11/19/21 2035 11/20/21 0959 11/20/21 1512 11/21/21 05621  11/22/21 0409  NA 136 135  --  136 136  K 2.7* 3.3*  --  3.2* 3.9  CL 101 100  --  104 104  CO2 19* 20*  --  23 21*  GLUCOSE 111* 90  --  80 85  BUN 13 13  --  8 <5*  CREATININE 0.64 0.64 0.63 0.62 0.53  CALCIUM 8.7*  8.6*  --  8.1* 8.4*  MG  --  1.5* 2.2  --  1.9  PHOS  --   --  3.3  --   --    GFR: Estimated Creatinine Clearance: 69.7 mL/min (by C-G formula based on SCr of 0.53 mg/dL). Recent Labs  Lab 11/20/21 0959 11/20/21 1512 11/21/21 0307 11/22/21 0409  WBC 11.9* 10.1 7.0 7.9    Liver Function Tests: Recent Labs  Lab 11/20/21 0959  AST 17  ALT 11  ALKPHOS 137*  BILITOT 0.6  PROT 7.2  ALBUMIN 2.9*   Recent Labs  Lab 11/20/21 0959  LIPASE 21   No results for input(s): "AMMONIA" in the last 168 hours.  ABG    Component Value Date/Time   HCO3 23.4 12/29/2006 0940   TCO2 25 12/29/2006 0940   ACIDBASEDEF 2.0 12/29/2006 0940     Coagulation Profile: No results for input(s): "INR", "PROTIME" in the last 168 hours.  Cardiac Enzymes: No results for input(s): "CKTOTAL", "CKMB", "CKMBINDEX", "TROPONINI" in the last 168 hours.  HbA1C: Hgb A1c MFr Bld  Date/Time Value Ref Range Status  04/28/2020 09:15 AM 5.7 (H) 4.8 - 5.6 % Final    Comment:    (NOTE) Pre diabetes:          5.7%-6.4%  Diabetes:              >6.4%  Glycemic control for   <7.0% adults with diabetes   11/28/2016 08:14 AM 5.7 (H) 4.8 - 5.6 % Final    Comment:    (NOTE) Pre diabetes:          5.7%-6.4% Diabetes:              >6.4% Glycemic control for   <7.0% adults with diabetes     CBG: No results for input(s): "GLUCAP" in the last 168 hours.  Review of Systems:   Constitutional: negative for anorexia, fevers and sweats  Eyes: negative for irritation, redness and visual disturbance  Ears, nose, mouth, throat, and face: negative for earaches, epistaxis, nasal congestion and sore throat  Respiratory: negative for cough, dyspnea on exertion, sputum and wheezing  Cardiovascular: negative for  lower extremity edema, orthopnea, palpitations and syncope  Genitourinary:negative for dysuria, frequency and hematuria  Hematologic/lymphatic: negative for bleeding, easy bruising and lymphadenopathy   Musculoskeletal:negative for arthralgias, muscle weakness and stiff joints  Neurological: negative for coordination problems, gait problems, headaches and weakness  Endocrine: negative for diabetic symptoms including polydipsia, polyuria and weight loss   Past Medical History:  She,  has a past medical history of Asthma, Chronic pelvic pain in female, COPD (chronic obstructive pulmonary disease) ([place]), Endometriosis, Polysubstance abuse ([place]), Stomach ulcer, Suicide attempt ([place]) (2008), and Syncope.   Surgical History:   Past Surgical History:  Procedure Laterality Date   ABDOMINAL HYSTERECTOMY     BALLOON DILATION N/A 07/26/2020   Procedure: BALLOON DILATION;  Surgeon: Eloise Harman, DO;  Location: [facility];  Service: Endoscopy;  Laterality: N/A;   BIOPSY  07/26/2020   Procedure: BIOPSY;  Surgeon: Eloise Harman, DO;  Location[facility];  Service: Endoscopy;;   BIOPSY  11/14/2021   Procedure: BIOPSY;  Surgeon: Gatha Mayer, MD;  Location[facility] END[facility];  Service: Gastroenterology;;   CERVICAL CONE BIOPSY     cervical cancer   CESAREAN SECTION  1993   CHOLECYSTECTOMY     COLONOSCOPY WITH PROPOFOL N/A 07/26/2020   Procedure: COLONOSCOPY WITH PROPOFOL;  Surgeon: Eloise Harman, DO;  Location: [facility];  Service: Endoscopy;  Laterality: N/A;  12:00pm   ESOPHAGOGASTRODUODENOSCOPY (EGD) WITH PROPOFOL N/A 07/26/2020   Procedure: ESOPHAGOGASTRODUODENOSCOPY (EGD) WITH PROPOFOL;  Surgeon: Eloise Harman, DO;  Location: [facility];  Service: Endoscopy;  Laterality: N/A;   ESOPHAGOGASTRODUODENOSCOPY (EGD) WITH PROPOFOL N/A 11/14/2021   Procedure: ESOPHAGOGASTRODUODENOSCOPY (EGD) WITH PROPOFOL;  Surgeon: Gatha Mayer, MD;  Location: [facility];  Service: Gastroenterology;  Laterality: N/A;   RADICAL HYSTERECTOMY WITH TRANSPOSITION OF OVARIES     TOOTH EXTRACTION     TUBAL LIGATION  1993     Social History:   reports that she has been smoking cigarettes. She has  a 13.50 pack-year smoking history. She has never used smokeless tobacco. She reports that she does not currently use alcohol. She reports current drug use. Frequency: 7.00 times per week. Drug: Marijuana.   Family History:  Her family history includes COPD in her paternal grandfather; Dementia in her sister; Diabetes in her mother; Heart attack in her maternal uncle, maternal uncle, and sister; Heart disease in her father and mother; Hypertension in her father and mother; Rheum arthritis in her mother; Stroke in her sister. There is no history of Colon cancer.   Allergies Allergies  Allergen Reactions   Asa [Aspirin] Anaphylaxis, Hives and Swelling   Codeine Nausea Only     Home Medications  Prior to Admission medications   Medication Sig Start Date End Date Taking? Authorizing Provider  acetaminophen (TYLENOL) 500 MG tablet Take 1,500 mg by mouth every 6 (six) hours as needed for moderate pain.   Yes [provider]  albuterol (PROVENTIL) (2.5 MG/3ML) 0.083% nebulizer solution INHALE 1 VIAL VIA NEBULIZER EVERY 6 HOURS AS NEEDED FOR WHEEZING OR SHORTNESS OF BREATH Patient taking differently: Take 2.5 mg by nebulization every 6 (six) hours as needed for wheezing or shortness of breath. 10/23/21  Yes Soyla Dryer, PA-C  albuterol (VENTOLIN HFA) 108 (90 Base) MCG/ACT inhaler INHALE 2 PUFFS BY MOUTH EVERY 6 HOURS AS NEEDED FOR COUGHING, WHEEZING, OR SHORTNESS OF BREATH Patient taking differently: Inhale 2 puffs into the lungs every 6 (six) hours as needed for shortness of breath or wheezing. 10/23/21  Yes Soyla Dryer, PA-C  atorvastatin (LIPITOR) 20 MG tablet TAKE 1 Tablet BY MOUTH ONCE DAILY Patient taking differently: Take 20 mg by mouth daily. 10/23/21  Yes Soyla Dryer, PA-C  diphenhydrAMINE (BENADRYL) 25 MG tablet Take 50 mg by mouth at bedtime.   Yes [provider]  fluticasone-salmeterol (WIXELA INHUB) 100-50 MCG/ACT AEPB Inhale 1 puff into the lungs 2 (two)  times daily.   Yes [provider]  guaiFENesin (MUCINEX) 600 MG 12 hr tablet Take 600 mg by mouth daily.   Yes [provider]  loratadine-pseudoephedrine (CLARITIN-D 24-HOUR) 10-240 MG 24 hr tablet Take 1 tablet by mouth in the morning.    Yes [provider]  montelukast (SINGULAIR) 10 MG tablet TAKE 1 Tablet BY MOUTH ONCE DAILY AS NEEDED Patient taking differently: Take 10 mg by mouth daily at 6 (six) AM. 10/23/21  Yes Soyla Dryer, PA-C  pantoprazole (PROTONIX) 40  MG tablet Take 1 tablet (40 mg total) by mouth 2 (two) times daily. 11/15/21 12/15/21 Yes Pahwani, Einar Grad, MD  nitroGLYCERIN (NITROSTAT) 0.4 MG SL tablet Place 1 tablet (0.4 mg total) under the tongue every 5 (five) minutes as needed for chest pain. 10/02/18   Soyla Dryer, PA-C     Kara Mead MD. FCCP. [facility] Pager : [phone]  If no response to pager , please call [phone] until 7 pm After 7:00 pm call Elink  [phone]   11/22/2021]

## 2021-11-22 NOTE — Progress Notes (Signed)
PROGRESS NOTE  Pamela Livingston BMW:[id number] DOB: [birth year] DOA: 11/20/2021 PCP: Pamela Dryer, PA-C  Brief History:  56 year old female with a history of COPD, peptic ulcer disease, GERD, obesity, polysubstance abuse including THC and tobacco, hypertension, and remote cocaine use presenting with pleuritic chest pain, coughing, and shortness of breath.  Notably, the patient was recently admitted to the hospital from 11/13/2021 to 11/15/2021.  At that time, the patient was treated for acute blood loss anemia secondary to GI bleed.  She underwent EGD on 11/14/2021 which showed erythematous mucosa in the gastric body and antrum there was a gastroesophageal flap valve grade 3.  There was no sign of GI bleed.  Melena was attributed to Pepto-Bismol. Patient states that her diarrhea subsequently resolved after discharge.  However she continued to have nausea and vomiting and malaise.  The patient represented with pleuritic chest discomfort and coughing.  She complains of shortness of breath and dyspnea on exertion. In the ED, the patient was afebrile and hemodynamically stable with oxygen saturation 94-98% on room air.  CT chest showed enlarging right pleural effusion with subtle pleural enhancement.  The pleural effusion was loculated.  There was concern for possible empyema.  There was worsening right basilar airspace disease with signs of consolidation in the lingula.  CT of the abdomen pelvis was negative for any acute findings.  WBC 11.9, hemoglobin 12.1, platelets 640,000.  Serum creatinine was 0.64.  LFTs were unremarkable.  The patient was started on Zosyn.  Pulmonary medicine was consulted to assist.   Assessment/Plan: Exudative Pleural Effusion/Parapneumonic Effusion -11/21/2021 thoracocentesis--430 cc -pleural fluid WBC 2454 (79L, 14 Mono, 7 neutrophils) -pleural fluid LDH 634, protein 4.3 -pleural fluid culture--neg to date -pulmonary consult--discussed with Dr. Elsworth Livingston -continue  empiric zosyn  Lobar Pneumonia -CT chest with worsen RLL airspace disease -continue zosyn -follow culture  Intractable nausea and vomiting/Cannabis Hyperemesis Syndrome -Start Zofran around-the-clock -Start pantoprazole twice daily -11/14/2021 EGD as discussed above -Suspect prolonged viral gastroenteritis  Obesity -BMI 32.24 -Lifestyle modification  Tobacco abuse -Tobacco cessation discussed  Hyperlipidemia -Continue statin  Hypokalemia -Repleted -Magnesium 1.9  COPD, without acute exacerbation Patient currently without wheezing on physical exam. -Continue pharmacy substitution for Wixela inhaler -DuoNebs as needed for shortness of breath/wheezing         Family Communication:   no Family at bedside  Consultants:  pulm  Code Status:  FULL  DVT Prophylaxis:  Lorraine Heparin    Procedures: As Listed in Progress Note Above  Antibiotics: Zosyn 10/30>>     Subjective: Pt states she is breathing better since thora. Still has Right pleuritic chest pain but a little better.  Denies n/v.  Has loose stool.  No hematochezia or melena  Objective: Vitals:   11/21/21 2223 11/22/21 0440 11/22/21 0522 11/22/21 1353  BP: (!) 140/84 (!) 142/92  133/76  Pulse: 77 82  81  Resp: 16 20    Temp: (!) 97.5 F (36.4 C) 98.3 F (36.8 C)    TempSrc:  Oral    SpO2: 96% 98% 98% 96%  Weight:      Height:        Intake/Output Summary (Last 24 hours) at 11/22/2021 1500 Last data filed at 11/22/2021 0802 Gross per 24 hour  Intake 480 ml  Output --  Net 480 ml   Weight change:  Exam:  General:  Pt is alert, follows commands appropriately, not in acute distress HEENT: No icterus, No thrush, No neck  mass, /AT Cardiovascular: RRR, S1/S2, no rubs, no gallops Respiratory: R-basilar rales.  Left CTA Abdomen: Soft/+BS, non tender, non distended, no guarding Extremities: No edema, No lymphangitis, No petechiae, No rashes, no synovitis   Data Reviewed: I have personally  reviewed following labs and imaging studies Basic Metabolic Panel: Recent Labs  Lab 11/19/21 2035 11/20/21 0959 11/20/21 1512 11/21/21 0307 11/22/21 0409  NA 136 135  --  136 136  K 2.7* 3.3*  --  3.2* 3.9  CL 101 100  --  104 104  CO2 19* 20*  --  23 21*  GLUCOSE 111* 90  --  80 85  BUN 13 13  --  8 <5*  CREATININE 0.64 0.64 0.63 0.62 0.53  CALCIUM 8.7* 8.6*  --  8.1* 8.4*  MG  --  1.5* 2.2  --  1.9  PHOS  --   --  3.3  --   --    Liver Function Tests: Recent Labs  Lab 11/20/21 0959  AST 17  ALT 11  ALKPHOS 137*  BILITOT 0.6  PROT 7.2  ALBUMIN 2.9*   Recent Labs  Lab 11/20/21 0959  LIPASE 21   No results for input(s): "AMMONIA" in the last 168 hours. Coagulation Profile: No results for input(s): "INR", "PROTIME" in the last 168 hours. CBC: Recent Labs  Lab 11/19/21 2035 11/20/21 0959 11/20/21 1512 11/21/21 0307 11/22/21 0409  WBC 12.1* 11.9* 10.1 7.0 7.9  NEUTROABS  --  8.7*  --   --   --   HGB 11.5* 12.1 11.1* 10.3* 11.3*  HCT 32.7* 36.0 32.7* 30.8* 32.1*  MCV 91.6 93.0 92.4 94.2 92.8  PLT 681* 640* 671* 625* 701*   Cardiac Enzymes: No results for input(s): "CKTOTAL", "CKMB", "CKMBINDEX", "TROPONINI" in the last 168 hours. BNP: Invalid input(s): "POCBNP" CBG: No results for input(s): "GLUCAP" in the last 168 hours. HbA1C: No results for input(s): "HGBA1C" in the last 72 hours. Urine analysis:    Component Value Date/Time   COLORURINE YELLOW 11/20/2021 [name] 11/20/2021 1156   LABSPEC 1.029 11/20/2021 1156   PHURINE 6.0 11/20/2021 1156   GLUCOSEU NEGATIVE 11/20/2021 1156   HGBUR LARGE (A) 11/20/2021 1156   BILIRUBINUR NEGATIVE 11/20/2021 1156   BILIRUBINUR neg 05/16/2020 1023   KETONESUR 20 (A) 11/20/2021 1156   PROTEINUR 30 (A) 11/20/2021 1156   UROBILINOGEN 0.2 05/16/2020 1023   UROBILINOGEN 0.2 06/03/2014 1000   NITRITE NEGATIVE 11/20/2021 1156   LEUKOCYTESUR NEGATIVE 11/20/2021 1156   Sepsis  Labs: '@LABRCNTIP'$ (procalcitonin:4,lacticidven:4) ) Recent Results (from the past 240 hour(s))  Resp Panel by RT-PCR (Flu A&B, Covid) Anterior Nasal Swab     Status: None   Collection Time: 11/13/21  9:26 AM   Specimen: Anterior Nasal Swab  Result Value Ref Range Status   SARS Coronavirus 2 by RT PCR NEGATIVE NEGATIVE Final    Comment: (NOTE) SARS-CoV-2 target nucleic acids are NOT DETECTED.  The SARS-CoV-2 RNA is generally detectable in upper respiratory specimens during the acute phase of infection. The lowest concentration of SARS-CoV-2 viral copies this assay can detect is 138 copies/mL. A negative result does not preclude SARS-Cov-2 infection and should not be used as the sole basis for treatment or other patient management decisions. A negative result may occur with  improper specimen collection/handling, submission of specimen other than nasopharyngeal swab, presence of viral mutation(s) within the areas targeted by this assay, and inadequate number of viral copies(<138 copies/mL). A negative result must be combined with  clinical observations, patient history, and epidemiological information. The expected result is Negative.  Fact Sheet for Patients:  EntrepreneurPulse.com.au  Fact Sheet for Healthcare Providers:  IncredibleEmployment.be  This test is no t yet approved or cleared by the [place] FDA and  has been authorized for detection and/or diagnosis of SARS-CoV-2 by FDA under an Emergency Use Authorization (EUA). This EUA will remain  in effect (meaning this test can be used) for the duration of the COVID-19 declaration under Section 564(b)(1) of the Act, 21 U.S.C.section 360bbb-3(b)(1), unless the authorization is terminated  or revoked sooner.       Influenza A by PCR NEGATIVE NEGATIVE Final   Influenza B by PCR NEGATIVE NEGATIVE Final    Comment: (NOTE) The Xpert Xpress SARS-CoV-2/FLU/RSV plus assay is intended as an  aid in the diagnosis of influenza from Nasopharyngeal swab specimens and should not be used as a sole basis for treatment. Nasal washings and aspirates are unacceptable for Xpert Xpress SARS-CoV-2/FLU/RSV testing.  Fact Sheet for Patients: EntrepreneurPulse.com.au  Fact Sheet for Healthcare Providers: IncredibleEmployment.be  This test is not yet approved or cleared by the [place] FDA and has been authorized for detection and/or diagnosis of SARS-CoV-2 by FDA under an Emergency Use Authorization (EUA). This EUA will remain in effect (meaning this test can be used) for the duration of the COVID-19 declaration under Section 564(b)(1) of the Act, 21 U.S.C. section 360bbb-3(b)(1), unless the authorization is terminated or revoked.  Performed at [facility], [place] [address] [place], [place] [address]   Urine Culture     Status: None   Collection Time: 11/13/21  3:32 PM   Specimen: Urine, Clean Catch  Result Value Ref Range Status   Specimen Description URINE, CLEAN CATCH  Final   Special Requests NONE  Final   Culture   Final    NO GROWTH Performed at [facility], [place] [address] [place], [place] [address]    Report Status 11/14/2021 FINAL  Final  Culture, body fluid w Gram Stain-bottle     Status: None (Preliminary result)   Collection Time: 11/20/21  1:36 PM   Specimen: Pleura  Result Value Ref Range Status   Specimen Description PLEURAL BOTTLES DRAWN AEROBIC AND ANAEROBIC  Final   Special Requests Blood Culture adequate volume  Final   Culture   Final    NO GROWTH 2 DAYS Performed at [facility], [address] [place], [place] [address]    Report Status PENDING  Incomplete  Gram stain     Status: None   Collection Time: 11/20/21  1:36 PM   Specimen: Pleura  Result Value Ref Range Status   Specimen Description PLEURAL  Final   Special Requests NONE  Final   Gram Stain   Final    NO ORGANISMS SEEN WBC PRESENT,  PREDOMINANTLY MONONUCLEAR CYTOSPIN SMEAR Performed at [facility], [address] [place], [place] [address]    Report Status 11/20/2021 FINAL  Final     Scheduled Meds:  atorvastatin  20 mg Oral Daily   budesonide (PULMICORT) nebulizer solution  0.5 mg Nebulization BID   dextromethorphan-guaiFENesin  1 tablet Oral BID   heparin  5,000 Units Subcutaneous Q8H   loratadine  10 mg Oral Daily   montelukast  10 mg Oral Q0600   nicotine  14 mg Transdermal Daily   pantoprazole  40 mg Oral BID   pseudoephedrine  60 mg Oral QID   saccharomyces boulardii  250 mg Oral BID  Continuous Infusions:  piperacillin-tazobactam (ZOSYN)  IV 3.375 g (11/22/21 0915)    Procedures/Studies: DG Chest 1 View  Result Date: 11/20/2021 CLINICAL DATA:  Post RIGHT thoracentesis EXAM: CHEST  1 VIEW COMPARISON:  Repeat exam 1403 hours compared to 1007 hours FINDINGS: Upper normal size of cardiac silhouette. RIGHT basilar effusion and atelectasis, accentuated by expiratory technique. No pneumothorax post thoracentesis. Linear subsegmental atelectasis LEFT lower lobe unchanged. IMPRESSION: No pneumothorax following RIGHT thoracentesis. Electronically Signed   By: Lavonia Dana M.D.   On: 11/20/2021 14:29   US THORACENTESIS ASP PLEURAL SPACE W/IMG GUIDE  Result Date: 11/20/2021 INDICATION: Partially loculated RIGHT pleural effusion EXAM: ULTRASOUND GUIDED DIAGNOSTIC RIGHT THORACENTESIS MEDICATIONS: None. COMPLICATIONS: None immediate. PROCEDURE: An ultrasound guided thoracentesis was thoroughly discussed with the patient and questions answered. The benefits, risks, alternatives and complications were also discussed. The patient understands and wishes to proceed with the procedure. Written consent was obtained. Ultrasound was performed to localize and mark an adequate pocket of fluid in the RIGHT chest. The area was then prepped and draped in the normal sterile fashion. 1% Lidocaine was used for local anesthesia. Under  ultrasound guidance a 19 gauge, 7-cm, Yueh catheter was introduced. Thoracentesis was performed. The catheter was removed and a dressing applied. FINDINGS: A total of approximately 430 cc of clear amber colored fluid was removed. Samples were sent to the laboratory as requested by the clinical team. IMPRESSION: Successful ultrasound guided RIGHT thoracentesis yielding 430 cc of pleural fluid. Electronically Signed   By: Lavonia Dana M.D.   On: 11/20/2021 14:20   CT CHEST ABDOMEN PELVIS W CONTRAST  Result Date: 11/20/2021 CLINICAL DATA:  A 56 year old female presents with nausea vomiting and weight loss. History of 10 pound weight loss and RIGHT-sided chest and abdominal pain for 2 weeks associated with nausea and vomiting. EXAM: CT CHEST, ABDOMEN, AND PELVIS WITH CONTRAST TECHNIQUE: Multidetector CT imaging of the chest, abdomen and pelvis was performed following the standard protocol during bolus administration of intravenous contrast. RADIATION DOSE REDUCTION: This exam was performed according to the departmental dose-optimization program which includes automated exposure control, adjustment of the mA and/or kV according to patient size and/or use of iterative reconstruction technique. CONTRAST:  171m OMNIPAQUE IOHEXOL 300 MG/ML  SOLN COMPARISON:  November 13, 2021 and October 24, 2020. FINDINGS: CT CHEST FINDINGS Cardiovascular: Calcified and noncalcified aortic atherosclerotic plaque is mild. No aneurysmal dilation of the thoracic aorta. Normal heart size without pericardial effusion. Normal caliber of central pulmonary vessels. Mediastinum/Nodes: No thoracic inlet, axillary, mediastinal or hilar adenopathy. Esophagus grossly normal. Lungs/Pleura: Enlarging RIGHT-sided pleural effusion, now moderately large, previously small with developing sub pulmonic component. Worsening RIGHT basilar airspace disease. Pleural effusion appears loculated. Subtle pleural enhancement may be developing though there is no thick  rind i and n there is no gas within the pleural fluid. Signs of lingular consolidation which has developed since previous imaging. Airways are patent aerated portions of the lung. No debris or material in the trachea. LEFT upper lobe pulmonary nodule (image 26/3). 6 mm. Small 3 mm nodule in the RIGHT upper lobe at the periphery. Musculoskeletal: No chest wall mass. CT ABDOMEN PELVIS FINDINGS Hepatobiliary: Post cholecystectomy. Mild fissural widening of hepatic fissures. No focal, suspicious hepatic lesion. The portal vein is patent. No biliary duct distension. Pancreas: Mild pancreatic atrophy.  No peripancreatic stranding. Spleen: Normal. Adrenals/Urinary Tract: Adrenal glands are unremarkable. Symmetric renal enhancement. No sign of hydronephrosis. No suspicious renal lesion or perinephric stranding. Urinary bladder is grossly unremarkable.  Stomach/Bowel: No stranding adjacent to the stomach. No sign of small bowel obstruction or inflammation. Appendix not visualized, no secondary signs to suggest acute appendicitis. No stranding adjacent to the colon. Vascular/Lymphatic: Aortic atherosclerosis. No sign of aneurysm. Smooth contour of the IVC. There is no gastrohepatic or hepatoduodenal ligament lymphadenopathy. No retroperitoneal or mesenteric lymphadenopathy. No pelvic sidewall lymphadenopathy. Atherosclerotic changes are mild. Reproductive: Post tubal ligation. Other: No ascites.  No pneumoperitoneum. Musculoskeletal: Spinal degenerative changes. Degenerative changes in the hips LEFT greater than RIGHT. No acute or destructive bone findings. IMPRESSION: 1. Enlarging RIGHT-sided pleural effusion, now moderately large, previously small with developing sub pulmonic component. Subtle pleural enhancement may be developing though there is no thick rind in there is no gas within the pleural fluid. Findings may reflect 2. Developing empyema is difficult to exclude given loculated appearance and subtle pleural  enhancement. 3. Worsening RIGHT basilar airspace disease and signs of consolidation in the lingula. Findings may reflect multifocal pneumonia in the appropriate clinical setting. 6 mm LEFT upper lobe pulmonary nodule. Non-contrast chest CT at 3-6 months is recommended. If the nodules are stable at time of repeat CT, then future CT at 18-24 months (from today's scan) is considered optional for low-risk patients, but is recommended for high-risk patients. This recommendation follows the consensus statement: Guidelines for Management of Incidental Pulmonary Nodules Detected on CT Images: From the Fleischner Society 2017; Radiology 2017; 284:228-243. 4. No acute findings in the abdomen or pelvis. 5. Post cholecystectomy. 6. Mild fissural widening of hepatic fissures. Correlate with any clinical or laboratory evidence of liver disease. 7. Aortic atherosclerosis. Aortic Atherosclerosis (ICD10-I70.0). Electronically Signed   By: Zetta Bills M.D.   On: 11/20/2021 11:51   DG Chest 2 View  Result Date: 11/20/2021 CLINICAL DATA:  Right chest pain EXAM: CHEST - 2 VIEW COMPARISON:  Previous studies including the examination of 11/20/2018 FINDINGS: Transverse diameter of heart is slightly increased. There are no signs of alveolar pulmonary edema. Moderate right pleural effusion is seen. Linear densities are seen in the lower lung fields suggesting subsegmental atelectasis. Possibility of underlying pneumonia in right lower lung field is not excluded. Overall, no significant interval changes are noted. Left lateral CP angle is unremarkable. There is no pneumothorax. IMPRESSION: Moderate right pleural effusion. Possible underlying atelectasis/pneumonia in right lower lung field. Small linear densities are seen in the lower lung fields on both sides suggesting subsegmental atelectasis. No significant interval changes are noted. Electronically Signed   By: Elmer Picker M.D.   On: 11/20/2021 10:23   DG Chest 2  View  Result Date: 11/19/2021 CLINICAL DATA:  Chest pain EXAM: CHEST - 2 VIEW COMPARISON:  11/09/2021 FINDINGS: Layering right pleural effusion with right lower lobe atelectasis or infiltrate. No confluent opacity on the left. Heart is borderline in size. No acute bony abnormality. IMPRESSION: Right pleural effusion with right lower lobe atelectasis or infiltrate. Electronically Signed   By: Rolm Baptise M.D.   On: 11/19/2021 21:04   CT ABDOMEN PELVIS W CONTRAST  Result Date: 11/13/2021 CLINICAL DATA:  Abdominal pain EXAM: CT ABDOMEN AND PELVIS WITH CONTRAST TECHNIQUE: Multidetector CT imaging of the abdomen and pelvis was performed using the standard protocol following bolus administration of intravenous contrast. RADIATION DOSE REDUCTION: This exam was performed according to the departmental dose-optimization program which includes automated exposure control, adjustment of the mA and/or kV according to patient size and/or use of iterative reconstruction technique. CONTRAST:  60m OMNIPAQUE IOHEXOL 350 MG/ML SOLN COMPARISON:  None Available. FINDINGS:  Lower chest: Small right pleural effusion with associated passive atelectasis. Hepatobiliary: Hepatic steatosis. Liver has a contour. No focal liver lesions are seen. Portal veins are contrast opacified. Gallbladder is surgically absent. No evidence of intra or extrahepatic biliary duct dilatation. Pancreas: No evidence of Peri pancreatic fat stranding. No evidence of pancreatic ductal dilatation. Spleen: Spleen is normal in size. No evidence of focal lesion or perisplenic fluid. Adrenals/Urinary Tract: Bilateral adrenal glands are normal in appearance. Evidence of hydronephrosis or nephrolithiasis. No focal renal lesions are visualized. There are nonspecific calcifications along the course of the right ureter, possibly related to prior embolization procedure. The urinary bladder is fluid-filled without evidence of focal wall thickening. Stomach/Bowel: The  stomach, small bowel, large bowel are normal in caliber without evidence of bowel obstruction. There is diverticulosis without definitive evidence of diverticulitis. Status post appendectomy. Vascular/Lymphatic: No significant vascular findings are present. No enlarged abdominal or pelvic lymph nodes. Reproductive: Uterus and bilateral adnexa are unremarkable. Other: No abdominal wall hernia or abnormality. No abdominopelvic ascites. Musculoskeletal: No acute or significant osseous findings. IMPRESSION: No finding below the diaphragm to explain abdominal pain. See separately dictated CT of the chest. Electronically Signed   By: Marin Roberts M.D.   On: 11/13/2021 13:47   CT Angio Chest PE W and/or Wo Contrast  Result Date: 11/13/2021 CLINICAL DATA:  Right sided pain, emesis EXAM: CT ANGIOGRAPHY CHEST WITH CONTRAST TECHNIQUE: Multidetector CT imaging of the chest was performed using the standard protocol during bolus administration of intravenous contrast. Multiplanar CT image reconstructions and MIPs were obtained to evaluate the vascular anatomy. RADIATION DOSE REDUCTION: This exam was performed according to the departmental dose-optimization program which includes automated exposure control, adjustment of the mA and/or kV according to patient size and/or use of iterative reconstruction technique. CONTRAST:  1m OMNIPAQUE IOHEXOL 350 MG/ML SOLN COMPARISON:  12/12/2019 FINDINGS: Cardiovascular: Heart size normal. No pericardial effusion. The RV is nondilated. Scattered coronary calcifications. Adequate contrast opacification of the thoracic aorta with no evidence of dissection, aneurysm, or stenosis. There is classic 3-vessel brachiocephalic arch anatomy without proximal stenosis. Scattered partially calcified plaque in the descending thoracic aorta. Mediastinum/Nodes: No mass or adenopathy. Lungs/Pleura: New small right pleural effusion. No pneumothorax. Dependent atelectasis at the right lung base. Platelike  linear scarring or subsegmental atelectasis in the left lower lobe as before. Upper Abdomen: No acute findings. Musculoskeletal: Mild spondylitic changes in the lower cervical, mid and lower thoracic spine. No acute findings. Review of the MIP images confirms the above findings. IMPRESSION: 1. Negative for acute PE or thoracic aortic dissection. 2. New small right pleural effusion. 3. Coronary and  aortic Atherosclerosis (ICD10-I70.0). Electronically Signed   By: DLucrezia EuropeM.D.   On: 11/13/2021 13:31   DG Chest 2 View  Result Date: 11/09/2021 CLINICAL DATA:  Mid chest tightness. Shortness of breath and nausea. Vomiting yesterday. EXAM: CHEST - 2 VIEW COMPARISON:  Chest two views 05/17/2021 and 12/20/2019 FINDINGS: Cardiac silhouette is again mildly enlarged. Mediastinal contours are within normal limits with mild calcification within aortic arch. New right mid to lower lung horizontal linear subsegmental atelectasis. No focal airspace opacity to indicate pneumonia. No pleural effusion or pneumothorax. Mild-to-moderate multilevel degenerative disc changes of the thoracic spine. Likely cholecystectomy clips. IMPRESSION: New right mid to lower lung subsegmental atelectasis. No radiographic evidence of pneumonia. Electronically Signed   By: RYvonne KendallM.D.   On: 11/09/2021 12:37    DOrson Eva DO  [facility]  If 7PM-7AM, please contact night-coverage  [email or web address] Password [email or web address] 11/22/2021, 3:00 PM   LOS: 2 days

## 2021-11-22 NOTE — Progress Notes (Signed)
Patient is stable. Patient complained of pain after she walked to the bathroom and was given prn morphine. Pain still around her flank area. No complaints for N/V/D with new diet order.

## 2021-11-22 NOTE — Hospital Course (Signed)
56 year old female with a history of COPD, peptic ulcer disease, GERD, obesity, polysubstance abuse including THC and tobacco, hypertension, and remote cocaine use presenting with pleuritic chest pain, coughing, and shortness of breath.  Notably, the patient was recently admitted to the hospital from 11/13/2021 to 11/15/2021.  At that time, the patient was treated for acute blood loss anemia secondary to GI bleed.  She underwent EGD on 11/14/2021 which showed erythematous mucosa in the gastric body and antrum there was a gastroesophageal flap valve grade 3.  There was no sign of GI bleed.  Melena was attributed to Pepto-Bismol. Patient states that her diarrhea subsequently resolved after discharge.  However she continued to have nausea and vomiting and malaise.  The patient represented with pleuritic chest discomfort and coughing.  She complains of shortness of breath and dyspnea on exertion. In the ED, the patient was afebrile and hemodynamically stable with oxygen saturation 94-98% on room air.  CT chest showed enlarging right pleural effusion with subtle pleural enhancement.  The pleural effusion was loculated.  There was concern for possible empyema.  There was worsening right basilar airspace disease with signs of consolidation in the lingula.  CT of the abdomen pelvis was negative for any acute findings.  WBC 11.9, hemoglobin 12.1, platelets 640,000.  Serum creatinine was 0.64.  LFTs were unremarkable.  The patient was started on Zosyn.  Pulmonary medicine was consulted to assist.

## 2021-11-23 ENCOUNTER — Inpatient Hospital Stay (HOSPITAL_COMMUNITY): Payer: Medicaid Other

## 2021-11-23 ENCOUNTER — Other Ambulatory Visit: Payer: Self-pay

## 2021-11-23 DIAGNOSIS — J90 Pleural effusion, not elsewhere classified: Secondary | ICD-10-CM

## 2021-11-23 LAB — BASIC METABOLIC PANEL
Anion gap: 10 (ref 5–15)
BUN: 5 mg/dL — ABNORMAL LOW (ref 6–20)
CO2: 25 mmol/L (ref 22–32)
Calcium: 8.7 mg/dL — ABNORMAL LOW (ref 8.9–10.3)
Chloride: 101 mmol/L (ref 98–111)
Creatinine, Ser: 0.59 mg/dL (ref 0.44–1.00)
GFR, Estimated: >60 mL/min (ref >60)
Glucose, Bld: 98 mg/dL (ref 70–99)
Potassium: 3.6 mmol/L (ref 3.5–5.1)
Sodium: 136 mmol/L (ref 135–145)

## 2021-11-23 LAB — CBC
HCT: 31.7 % — ABNORMAL LOW (ref 36.0–46.0)
Hemoglobin: 10.7 g/dL — ABNORMAL LOW (ref 12.0–15.0)
MCH: 31.5 pg (ref 26.0–34.0)
MCHC: 33.8 g/dL (ref 30.0–36.0)
MCV: 93.2 fL (ref 80.0–100.0)
Platelets: 682 10*3/uL — ABNORMAL HIGH (ref 150–400)
RBC: 3.4 MIL/uL — ABNORMAL LOW (ref 3.87–5.11)
RDW: 12.8 % (ref 11.5–15.5)
WBC: 7.2 10*3/uL (ref 4.0–10.5)
nRBC: 0 % (ref 0.0–0.2)

## 2021-11-23 LAB — MAGNESIUM: Magnesium: 1.7 mg/dL (ref 1.7–2.4)

## 2021-11-23 NOTE — Progress Notes (Signed)
PROGRESS NOTE  Pamela Livingston YTK:[id number] DOB: 25-Oct-1965 DOA: 11/20/2021 PCP: Soyla Dryer, PA-C  Brief History:  56 year old female with a history of COPD, peptic ulcer disease, GERD, obesity, polysubstance abuse including THC and tobacco, hypertension, and remote cocaine use presenting with pleuritic chest pain, coughing, and shortness of breath.  Notably, the patient was recently admitted to the hospital from 11/13/2021 to 11/15/2021.  At that time, the patient was treated for acute blood loss anemia secondary to GI bleed.  She underwent EGD on 11/14/2021 which showed erythematous mucosa in the gastric body and antrum there was a gastroesophageal flap valve grade 3.  There was no sign of GI bleed.  Melena was attributed to Pepto-Bismol. Patient states that her diarrhea subsequently resolved after discharge.  However she continued to have nausea and vomiting and malaise.  The patient represented with pleuritic chest discomfort and coughing.  She complains of shortness of breath and dyspnea on exertion. In the ED, the patient was afebrile and hemodynamically stable with oxygen saturation 94-98% on room air.  CT chest showed enlarging right pleural effusion with subtle pleural enhancement.  The pleural effusion was loculated.  There was concern for possible empyema.  There was worsening right basilar airspace disease with signs of consolidation in the lingula.  CT of the abdomen pelvis was negative for any acute findings.  WBC 11.9, hemoglobin 12.1, platelets 640,000.  Serum creatinine was 0.64.  LFTs were unremarkable.  The patient was started on Zosyn.  Pulmonary medicine was consulted to assist.     Assessment and Plan:  Exudative Pleural Effusion/Parapneumonic Effusion -11/21/2021 thoracocentesis--430 cc -pleural fluid WBC 2454 (79L, 14 Mono, 7 neutrophils) -pleural fluid LDH 634, protein 4.3 -pleural fluid culture--neg to date -pulmonary consult--discussed with Dr. Janean Sark  clinical improvement, invasive intervention not felt necessary -continue empiric zosyn -11/23/21 CXR smaller right pleural effusion   Lobar Pneumonia -CT chest with worsen RLL airspace disease -continue zosyn -follow pleural fluid culture--neg   Intractable nausea and vomiting/Cannabis Hyperemesis Syndrome -Started pantoprazole twice daily -11/14/2021 EGD as discussed above -Suspect prolonged viral gastroenteritis -overall improved   Obesity -BMI 32.24 -Lifestyle modification   Tobacco abuse -Tobacco cessation discussed   Hyperlipidemia -Continue statin   Hypokalemia -Repleted -Magnesium 1.9   COPD, without acute exacerbation Patient currently without wheezing on physical exam. -Continue pharmacy substitution for Wixela inhaler -DuoNebs as needed for shortness of breath/wheezing                 Family Communication:   no Family at bedside   Consultants:  pulm   Code Status:  FULL   DVT Prophylaxis:  [place] Heparin      Procedures: As Listed in Progress Note Above   Antibiotics: Zosyn 10/30>>          Subjective: Patient states sob is improving as well as pleuritic cp.  States n/v are improving.  Denies f/c, sob, abd pain.  Objective: Vitals:   11/23/21 0449 11/23/21 0803 11/23/21 0906 11/23/21 1333  BP: (!) 151/94 (!) 162/80 127/85 128/83  Pulse: 80 71 84 74  Resp: '20  19 16  '$ Temp: 97.7 F (36.5 C)  98.1 F (36.7 C) 97.7 F (36.5 C)  TempSrc: Oral  Oral   SpO2: 96% 99% 100% 97%  Weight:      Height:        Intake/Output Summary (Last 24 hours) at 11/23/2021 1806 Last data filed at 11/23/2021 1328 Gross per 24  hour  Intake 720 ml  Output --  Net 720 ml   Weight change:  Exam:  General:  Pt is alert, follows commands appropriately, not in acute distress HEENT: No icterus, No thrush, No neck mass, [place]/AT Cardiovascular: RRR, S1/S2, no rubs, no gallops Respiratory: R-basilar crackles.  L-CTA. No wheeze Abdomen: Soft/+BS, non tender,  non distended, no guarding Extremities: No edema, No lymphangitis, No petechiae, No rashes, no synovitis   Data Reviewed: I have personally reviewed following labs and imaging studies Basic Metabolic Panel: Recent Labs  Lab 11/19/21 2035 11/20/21 0959 11/20/21 1512 11/21/21 0307 11/22/21 0409 11/23/21 0340  NA 136 135  --  136 136 136  K 2.7* 3.3*  --  3.2* 3.9 3.6  CL 101 100  --  104 104 101  CO2 19* 20*  --  23 21* 25  GLUCOSE 111* 90  --  80 85 98  BUN 13 13  --  8 <5* 5*  CREATININE 0.64 0.64 0.63 0.62 0.53 0.59  CALCIUM 8.7* 8.6*  --  8.1* 8.4* 8.7*  MG  --  1.5* 2.2  --  1.9 1.7  PHOS  --   --  3.3  --   --   --    Liver Function Tests: Recent Labs  Lab 11/20/21 0959  AST 17  ALT 11  ALKPHOS 137*  BILITOT 0.6  PROT 7.2  ALBUMIN 2.9*   Recent Labs  Lab 11/20/21 0959  LIPASE 21   No results for input(s): "AMMONIA" in the last 168 hours. Coagulation Profile: No results for input(s): "INR", "PROTIME" in the last 168 hours. CBC: Recent Labs  Lab 11/20/21 0959 11/20/21 1512 11/21/21 0307 11/22/21 0409 11/23/21 0340  WBC 11.9* 10.1 7.0 7.9 7.2  NEUTROABS 8.7*  --   --   --   --   HGB 12.1 11.1* 10.3* 11.3* 10.7*  HCT 36.0 32.7* 30.8* 32.1* 31.7*  MCV 93.0 92.4 94.2 92.8 93.2  PLT 640* 671* 625* 701* 682*   Cardiac Enzymes: No results for input(s): "CKTOTAL", "CKMB", "CKMBINDEX", "TROPONINI" in the last 168 hours. BNP: Invalid input(s): "POCBNP" CBG: No results for input(s): "GLUCAP" in the last 168 hours. HbA1C: No results for input(s): "HGBA1C" in the last 72 hours. Urine analysis:    Component Value Date/Time   COLORURINE YELLOW 11/20/2021 Power 11/20/2021 1156   LABSPEC 1.029 11/20/2021 1156   PHURINE 6.0 11/20/2021 1156   GLUCOSEU NEGATIVE 11/20/2021 1156   HGBUR LARGE (A) 11/20/2021 1156   BILIRUBINUR NEGATIVE 11/20/2021 1156   BILIRUBINUR neg 05/16/2020 1023   KETONESUR 20 (A) 11/20/2021 1156   PROTEINUR 30 (A)  11/20/2021 1156   UROBILINOGEN 0.2 05/16/2020 1023   UROBILINOGEN 0.2 06/03/2014 1000   NITRITE NEGATIVE 11/20/2021 1156   LEUKOCYTESUR NEGATIVE 11/20/2021 1156   Sepsis Labs: '@LABRCNTIP'$ (procalcitonin:4,lacticidven:4) ) Recent Results (from the past 240 hour(s))  Culture, body fluid w Gram Stain-bottle     Status: None (Preliminary result)   Collection Time: 11/20/21  1:36 PM   Specimen: Pleura  Result Value Ref Range Status   Specimen Description PLEURAL BOTTLES DRAWN AEROBIC AND ANAEROBIC  Final   Special Requests Blood Culture adequate volume  Final   Culture   Final    NO GROWTH 3 DAYS Performed at [facility], [address], [place], [place] [address]    Report Status PENDING  Incomplete  Gram stain     Status: None   Collection Time: 11/20/21  1:36 PM  Specimen: Pleura  Result Value Ref Range Status   Specimen Description PLEURAL  Final   Special Requests NONE  Final   Gram Stain   Final    NO ORGANISMS SEEN WBC PRESENT, PREDOMINANTLY MONONUCLEAR CYTOSPIN SMEAR Performed at [facility], [address] [place], [place] [address]    Report Status 11/20/2021 FINAL  Final     Scheduled Meds:  atorvastatin  20 mg Oral Daily   budesonide (PULMICORT) nebulizer solution  0.5 mg Nebulization BID   dextromethorphan-guaiFENesin  1 tablet Oral BID   heparin  5,000 Units Subcutaneous Q8H   loratadine  10 mg Oral Daily   montelukast  10 mg Oral Q0600   nicotine  14 mg Transdermal Daily   pantoprazole  40 mg Oral BID   pseudoephedrine  60 mg Oral QID   saccharomyces boulardii  250 mg Oral BID   Continuous Infusions:  piperacillin-tazobactam (ZOSYN)  IV 3.375 g (11/23/21 1054)    Procedures/Studies: DG Chest 2 View  Result Date: 11/23/2021 CLINICAL DATA:  Pleural effusion EXAM: CHEST - 2 VIEW COMPARISON:  Chest x-ray dated November 20, 2021 FINDINGS: Visualized cardiac and mediastinal contours are unchanged. Stable small right pleural effusion. Lower lung linear  opacities which are likely due to atelectasis, unchanged. No evidence of pneumothorax. IMPRESSION: Stable small right pleural effusion. Electronically Signed   By: Yetta Glassman M.D.   On: 11/23/2021 08:29   DG Chest 1 View  Result Date: 11/20/2021 CLINICAL DATA:  Post RIGHT thoracentesis EXAM: CHEST  1 VIEW COMPARISON:  Repeat exam 1403 hours compared to 1007 hours FINDINGS: Upper normal size of cardiac silhouette. RIGHT basilar effusion and atelectasis, accentuated by expiratory technique. No pneumothorax post thoracentesis. Linear subsegmental atelectasis LEFT lower lobe unchanged. IMPRESSION: No pneumothorax following RIGHT thoracentesis. Electronically Signed   By: Lavonia Dana M.D.   On: 11/20/2021 14:29   US THORACENTESIS ASP PLEURAL SPACE W/IMG GUIDE  Result Date: 11/20/2021 INDICATION: Partially loculated RIGHT pleural effusion EXAM: ULTRASOUND GUIDED DIAGNOSTIC RIGHT THORACENTESIS MEDICATIONS: None. COMPLICATIONS: None immediate. PROCEDURE: An ultrasound guided thoracentesis was thoroughly discussed with the patient and questions answered. The benefits, risks, alternatives and complications were also discussed. The patient understands and wishes to proceed with the procedure. Written consent was obtained. Ultrasound was performed to localize and mark an adequate pocket of fluid in the RIGHT chest. The area was then prepped and draped in the normal sterile fashion. 1% Lidocaine was used for local anesthesia. Under ultrasound guidance a 19 gauge, 7-cm, Yueh catheter was introduced. Thoracentesis was performed. The catheter was removed and a dressing applied. FINDINGS: A total of approximately 430 cc of clear amber colored fluid was removed. Samples were sent to the laboratory as requested by the clinical team. IMPRESSION: Successful ultrasound guided RIGHT thoracentesis yielding 430 cc of pleural fluid. Electronically Signed   By: Lavonia Dana M.D.   On: 11/20/2021 14:20   CT CHEST ABDOMEN  PELVIS W CONTRAST  Result Date: 11/20/2021 CLINICAL DATA:  A 56 year old female presents with nausea vomiting and weight loss. History of 10 pound weight loss and RIGHT-sided chest and abdominal pain for 2 weeks associated with nausea and vomiting. EXAM: CT CHEST, ABDOMEN, AND PELVIS WITH CONTRAST TECHNIQUE: Multidetector CT imaging of the chest, abdomen and pelvis was performed following the standard protocol during bolus administration of intravenous contrast. RADIATION DOSE REDUCTION: This exam was performed according to the departmental dose-optimization program which includes automated exposure control, adjustment of the mA and/or kV according to patient size  and/or use of iterative reconstruction technique. CONTRAST:  13m OMNIPAQUE IOHEXOL 300 MG/ML  SOLN COMPARISON:  November 13, 2021 and October 24, 2020. FINDINGS: CT CHEST FINDINGS Cardiovascular: Calcified and noncalcified aortic atherosclerotic plaque is mild. No aneurysmal dilation of the thoracic aorta. Normal heart size without pericardial effusion. Normal caliber of central pulmonary vessels. Mediastinum/Nodes: No thoracic inlet, axillary, mediastinal or hilar adenopathy. Esophagus grossly normal. Lungs/Pleura: Enlarging RIGHT-sided pleural effusion, now moderately large, previously small with developing sub pulmonic component. Worsening RIGHT basilar airspace disease. Pleural effusion appears loculated. Subtle pleural enhancement may be developing though there is no thick rind i and n there is no gas within the pleural fluid. Signs of lingular consolidation which has developed since previous imaging. Airways are patent aerated portions of the lung. No debris or material in the trachea. LEFT upper lobe pulmonary nodule (image 26/3). 6 mm. Small 3 mm nodule in the RIGHT upper lobe at the periphery. Musculoskeletal: No chest wall mass. CT ABDOMEN PELVIS FINDINGS Hepatobiliary: Post cholecystectomy. Mild fissural widening of hepatic fissures. No  focal, suspicious hepatic lesion. The portal vein is patent. No biliary duct distension. Pancreas: Mild pancreatic atrophy.  No peripancreatic stranding. Spleen: Normal. Adrenals/Urinary Tract: Adrenal glands are unremarkable. Symmetric renal enhancement. No sign of hydronephrosis. No suspicious renal lesion or perinephric stranding. Urinary bladder is grossly unremarkable. Stomach/Bowel: No stranding adjacent to the stomach. No sign of small bowel obstruction or inflammation. Appendix not visualized, no secondary signs to suggest acute appendicitis. No stranding adjacent to the colon. Vascular/Lymphatic: Aortic atherosclerosis. No sign of aneurysm. Smooth contour of the IVC. There is no gastrohepatic or hepatoduodenal ligament lymphadenopathy. No retroperitoneal or mesenteric lymphadenopathy. No pelvic sidewall lymphadenopathy. Atherosclerotic changes are mild. Reproductive: Post tubal ligation. Other: No ascites.  No pneumoperitoneum. Musculoskeletal: Spinal degenerative changes. Degenerative changes in the hips LEFT greater than RIGHT. No acute or destructive bone findings. IMPRESSION: 1. Enlarging RIGHT-sided pleural effusion, now moderately large, previously small with developing sub pulmonic component. Subtle pleural enhancement may be developing though there is no thick rind in there is no gas within the pleural fluid. Findings may reflect 2. Developing empyema is difficult to exclude given loculated appearance and subtle pleural enhancement. 3. Worsening RIGHT basilar airspace disease and signs of consolidation in the lingula. Findings may reflect multifocal pneumonia in the appropriate clinical setting. 6 mm LEFT upper lobe pulmonary nodule. Non-contrast chest CT at 3-6 months is recommended. If the nodules are stable at time of repeat CT, then future CT at 18-24 months (from today's scan) is considered optional for low-risk patients, but is recommended for high-risk patients. This recommendation follows  the consensus statement: Guidelines for Management of Incidental Pulmonary Nodules Detected on CT Images: From the Fleischner Society 2017; Radiology 2017; 284:228-243. 4. No acute findings in the abdomen or pelvis. 5. Post cholecystectomy. 6. Mild fissural widening of hepatic fissures. Correlate with any clinical or laboratory evidence of liver disease. 7. Aortic atherosclerosis. Aortic Atherosclerosis (ICD10-I70.0). Electronically Signed   By: GZetta BillsM.D.   On: 11/20/2021 11:51   DG Chest 2 View  Result Date: 11/20/2021 CLINICAL DATA:  Right chest pain EXAM: CHEST - 2 VIEW COMPARISON:  Previous studies including the examination of 11/20/2018 FINDINGS: Transverse diameter of heart is slightly increased. There are no signs of alveolar pulmonary edema. Moderate right pleural effusion is seen. Linear densities are seen in the lower lung fields suggesting subsegmental atelectasis. Possibility of underlying pneumonia in right lower lung field is not excluded. Overall, no significant interval  changes are noted. Left lateral CP angle is unremarkable. There is no pneumothorax. IMPRESSION: Moderate right pleural effusion. Possible underlying atelectasis/pneumonia in right lower lung field. Small linear densities are seen in the lower lung fields on both sides suggesting subsegmental atelectasis. No significant interval changes are noted. Electronically Signed   By: Elmer Picker M.D.   On: 11/20/2021 10:23   DG Chest 2 View  Result Date: 11/19/2021 CLINICAL DATA:  Chest pain EXAM: CHEST - 2 VIEW COMPARISON:  11/09/2021 FINDINGS: Layering right pleural effusion with right lower lobe atelectasis or infiltrate. No confluent opacity on the left. Heart is borderline in size. No acute bony abnormality. IMPRESSION: Right pleural effusion with right lower lobe atelectasis or infiltrate. Electronically Signed   By: Rolm Baptise M.D.   On: 11/19/2021 21:04   CT ABDOMEN PELVIS W CONTRAST  Result Date:  11/13/2021 CLINICAL DATA:  Abdominal pain EXAM: CT ABDOMEN AND PELVIS WITH CONTRAST TECHNIQUE: Multidetector CT imaging of the abdomen and pelvis was performed using the standard protocol following bolus administration of intravenous contrast. RADIATION DOSE REDUCTION: This exam was performed according to the departmental dose-optimization program which includes automated exposure control, adjustment of the mA and/or kV according to patient size and/or use of iterative reconstruction technique. CONTRAST:  59m OMNIPAQUE IOHEXOL 350 MG/ML SOLN COMPARISON:  None Available. FINDINGS: Lower chest: Small right pleural effusion with associated passive atelectasis. Hepatobiliary: Hepatic steatosis. Liver has a contour. No focal liver lesions are seen. Portal veins are contrast opacified. Gallbladder is surgically absent. No evidence of intra or extrahepatic biliary duct dilatation. Pancreas: No evidence of Peri pancreatic fat stranding. No evidence of pancreatic ductal dilatation. Spleen: Spleen is normal in size. No evidence of focal lesion or perisplenic fluid. Adrenals/Urinary Tract: Bilateral adrenal glands are normal in appearance. Evidence of hydronephrosis or nephrolithiasis. No focal renal lesions are visualized. There are nonspecific calcifications along the course of the right ureter, possibly related to prior embolization procedure. The urinary bladder is fluid-filled without evidence of focal wall thickening. Stomach/Bowel: The stomach, small bowel, large bowel are normal in caliber without evidence of bowel obstruction. There is diverticulosis without definitive evidence of diverticulitis. Status post appendectomy. Vascular/Lymphatic: No significant vascular findings are present. No enlarged abdominal or pelvic lymph nodes. Reproductive: Uterus and bilateral adnexa are unremarkable. Other: No abdominal wall hernia or abnormality. No abdominopelvic ascites. Musculoskeletal: No acute or significant osseous  findings. IMPRESSION: No finding below the diaphragm to explain abdominal pain. See separately dictated CT of the chest. Electronically Signed   By: HMarin RobertsM.D.   On: 11/13/2021 13:47   CT Angio Chest PE W and/or Wo Contrast  Result Date: 11/13/2021 CLINICAL DATA:  Right sided pain, emesis EXAM: CT ANGIOGRAPHY CHEST WITH CONTRAST TECHNIQUE: Multidetector CT imaging of the chest was performed using the standard protocol during bolus administration of intravenous contrast. Multiplanar CT image reconstructions and MIPs were obtained to evaluate the vascular anatomy. RADIATION DOSE REDUCTION: This exam was performed according to the departmental dose-optimization program which includes automated exposure control, adjustment of the mA and/or kV according to patient size and/or use of iterative reconstruction technique. CONTRAST:  879mOMNIPAQUE IOHEXOL 350 MG/ML SOLN COMPARISON:  12/12/2019 FINDINGS: Cardiovascular: Heart size normal. No pericardial effusion. The RV is nondilated. Scattered coronary calcifications. Adequate contrast opacification of the thoracic aorta with no evidence of dissection, aneurysm, or stenosis. There is classic 3-vessel brachiocephalic arch anatomy without proximal stenosis. Scattered partially calcified plaque in the descending thoracic aorta. Mediastinum/Nodes: No mass or  adenopathy. Lungs/Pleura: New small right pleural effusion. No pneumothorax. Dependent atelectasis at the right lung base. Platelike linear scarring or subsegmental atelectasis in the left lower lobe as before. Upper Abdomen: No acute findings. Musculoskeletal: Mild spondylitic changes in the lower cervical, mid and lower thoracic spine. No acute findings. Review of the MIP images confirms the above findings. IMPRESSION: 1. Negative for acute PE or thoracic aortic dissection. 2. New small right pleural effusion. 3. Coronary and  aortic Atherosclerosis (ICD10-I70.0). Electronically Signed   By: Lucrezia Europe M.D.    On: 11/13/2021 13:31   DG Chest 2 View  Result Date: 11/09/2021 CLINICAL DATA:  Mid chest tightness. Shortness of breath and nausea. Vomiting yesterday. EXAM: CHEST - 2 VIEW COMPARISON:  Chest two views 05/17/2021 and 12/20/2019 FINDINGS: Cardiac silhouette is again mildly enlarged. Mediastinal contours are within normal limits with mild calcification within aortic arch. New right mid to lower lung horizontal linear subsegmental atelectasis. No focal airspace opacity to indicate pneumonia. No pleural effusion or pneumothorax. Mild-to-moderate multilevel degenerative disc changes of the thoracic spine. Likely cholecystectomy clips. IMPRESSION: New right mid to lower lung subsegmental atelectasis. No radiographic evidence of pneumonia. Electronically Signed   By: Yvonne Kendall M.D.   On: 11/09/2021 12:37    Orson Eva, DO  [facility]  If 7PM-7AM, please contact night-coverage [email or web address] Password [email or web address] 11/23/2021, 6:06 PM   LOS: 3 days

## 2021-11-23 NOTE — Progress Notes (Signed)
NAME:  Pamela Livingston, MRN:  [id number], DOB:  [birth year], LOS: 3 ADMISSION DATE:  11/20/2021, CONSULTATION DATE:  11/23/2021  REFERRING MD:  Tat, [facility], CHIEF COMPLAINT: Diarrhea, pleural effusion  History of Present Illness:  56 year old smoker presented with nausea vomiting and diarrhea ongoing for 3 weeks.  She presented 10/23 with right-sided chest pain and emesis and was hospitalized CT angiogram chest showed small right pleural effusion, CT abdomen was negative She had mild leukocytosis 15.6, hemoglobin 11.8 with fecal occult blood positive. She underwent EGD 10/24 which showed gastric mucosal erythema and gastric polyp which was biopsied She will return on 10/31 with right-sided chest pain, pleuritic and intermittent nausea and vomiting. Chest x-ray showed increased right effusion.  CT chest/abdomen/pelvis showed right basilar airspace disease with large right effusion that appeared loculated.  There was pleural enhancement to suggest empyema.  6 mm left upper lobe nodule was noted She underwent thoracentesis with removal of 430 cc of clear amber-yellow fluid  Pertinent  Medical History  "COPD" Marijuana abuse  Significant Hospital Events: Including procedures, antibiotic start and stop dates in addition to other pertinent events   10/30 right thoracentesis >> lymphocytic exudate  Interim History / Subjective:   Feels much better No loose stools Afebrile  Objective   Blood pressure 128/83, pulse 74, temperature 97.7 F (36.5 C), resp. rate 16, height '4\' 11"'$  (1.499 m), weight 72.4 kg, SpO2 97 %.        Intake/Output Summary (Last 24 hours) at 11/23/2021 1503 Last data filed at 11/23/2021 1328 Gross per 24 hour  Intake 720 ml  Output --  Net 720 ml    Filed Weights   11/20/21 1439  Weight: 72.4 kg    Examination: General: Middle-aged woman, lying supine in bed, no distress HENT: Mild pallor, no icterus, no JVD, no lymphadenopathy Lungs: Improved air entry on right,  clear on left, no accessory muscle use Cardiovascular: S1-S2 regular, no murmur Abdomen:, Soft nontender, no guarding no hepatosplenomegaly Extremities: No deformity, no edema Neuro: Alert oriented, nonfocal  Labs show normal electrolytes, no leukocytosis, stable hemoglobin   Resolved Hospital Problem list     Assessment & Plan:  Acute development of right pleural effusion, increased in size compared to 10/24, now loculated -thoracentesis shows lymphocytic exudate , possibly viral etiology but loculation concerning for bacterial -She she is clinically improved and chest x-ray today shows decreased diffusion  I discussed options with the patient including antibiotics alone, chest tube with or without lytics or surgery/VATS -Given clinical improvement and invasive intervention not felt necessary, pleural fluid culture is negative, lymphocytic predominance indicates good prognosis.  Recommend repeat chest x-ray in 1 to 2 weeks  Left upper lobe pulmonary nodule -will need 47-monthfollow-up CT chest  COPD/tobacco abuse -PFTs as outpatient once active issues resolved -Smoking cessation encouraged  Best Practice (right click and "Reselect all SmartList Selections" daily)   Per [facility] Code Status:  full code Last date of multidisciplinary goals of care discussion [NA]  Labs   CBC: Recent Labs  Lab 11/20/21 0959 11/20/21 1512 11/21/21 0307 11/22/21 0409 11/23/21 0340  WBC 11.9* 10.1 7.0 7.9 7.2  NEUTROABS 8.7*  --   --   --   --   HGB 12.1 11.1* 10.3* 11.3* 10.7*  HCT 36.0 32.7* 30.8* 32.1* 31.7*  MCV 93.0 92.4 94.2 92.8 93.2  PLT 640* 671* 625* 701* 682*     Basic Metabolic Panel: Recent Labs  Lab 11/19/21 2035 11/20/21 0959 11/20/21 1512 11/21/21  7078 11/22/21 0409 11/23/21 0340  NA 136 135  --  136 136 136  K 2.7* 3.3*  --  3.2* 3.9 3.6  CL 101 100  --  104 104 101  CO2 19* 20*  --  23 21* 25  GLUCOSE 111* 90  --  80 85 98  BUN 13 13  --  8 <5* 5*  CREATININE  0.64 0.64 0.63 0.62 0.53 0.59  CALCIUM 8.7* 8.6*  --  8.1* 8.4* 8.7*  MG  --  1.5* 2.2  --  1.9 1.7  PHOS  --   --  3.3  --   --   --     GFR: Estimated Creatinine Clearance: 69.7 mL/min (by C-G formula based on SCr of 0.59 mg/dL). Recent Labs  Lab 11/20/21 1512 11/21/21 0307 11/22/21 0409 11/23/21 0340  WBC 10.1 7.0 7.9 7.2     Liver Function Tests: Recent Labs  Lab 11/20/21 0959  AST 17  ALT 11  ALKPHOS 137*  BILITOT 0.6  PROT 7.2  ALBUMIN 2.9*    Recent Labs  Lab 11/20/21 0959  LIPASE 21    No results for input(s): "AMMONIA" in the last 168 hours.  ABG    Component Value Date/Time   HCO3 23.4 12/29/2006 0940   TCO2 25 12/29/2006 0940   ACIDBASEDEF 2.0 12/29/2006 0940     Coagulation Profile: No results for input(s): "INR", "PROTIME" in the last 168 hours.  Cardiac Enzymes: No results for input(s): "CKTOTAL", "CKMB", "CKMBINDEX", "TROPONINI" in the last 168 hours.  HbA1C: Hgb A1c MFr Bld  Date/Time Value Ref Range Status  04/28/2020 09:15 AM 5.7 (H) 4.8 - 5.6 % Final    Comment:    (NOTE) Pre diabetes:          5.7%-6.4%  Diabetes:              >6.4%  Glycemic control for   <7.0% adults with diabetes   11/28/2016 08:14 AM 5.7 (H) 4.8 - 5.6 % Final    Comment:    (NOTE) Pre diabetes:          5.7%-6.4% Diabetes:              >6.4% Glycemic control for   <7.0% adults with diabetes     CBG: No results for input(s): "GLUCAP" in the last 168 hours.  Kara Mead MD. Shade Flood. [facility] Pager : [phone]  If no response to pager , please call [phone] until 7 pm After 7:00 pm call Elink  [phone]   11/23/2021]

## 2021-11-23 NOTE — Progress Notes (Signed)
Patient did not sleep very well this shift, she was awake every time nurse entered the room. Patient denies pain or discomfort at this time.

## 2021-11-24 DIAGNOSIS — J181 Lobar pneumonia, unspecified organism: Secondary | ICD-10-CM

## 2021-11-24 DIAGNOSIS — J918 Pleural effusion in other conditions classified elsewhere: Secondary | ICD-10-CM

## 2021-11-24 DIAGNOSIS — J189 Pneumonia, unspecified organism: Secondary | ICD-10-CM

## 2021-11-24 LAB — BASIC METABOLIC PANEL
Anion gap: 16 — ABNORMAL HIGH (ref 5–15)
BUN: 7 mg/dL (ref 6–20)
CO2: 24 mmol/L (ref 22–32)
Calcium: 9.3 mg/dL (ref 8.9–10.3)
Chloride: 99 mmol/L (ref 98–111)
Creatinine, Ser: 0.65 mg/dL (ref 0.44–1.00)
GFR, Estimated: >60 mL/min (ref >60)
Glucose, Bld: 115 mg/dL — ABNORMAL HIGH (ref 70–99)
Potassium: 3.4 mmol/L — ABNORMAL LOW (ref 3.5–5.1)
Sodium: 139 mmol/L (ref 135–145)

## 2021-11-24 LAB — CBC
HCT: 35.7 % — ABNORMAL LOW (ref 36.0–46.0)
Hemoglobin: 11.7 g/dL — ABNORMAL LOW (ref 12.0–15.0)
MCH: 30.9 pg (ref 26.0–34.0)
MCHC: 32.8 g/dL (ref 30.0–36.0)
MCV: 94.2 fL (ref 80.0–100.0)
Platelets: 726 10*3/uL — ABNORMAL HIGH (ref 150–400)
RBC: 3.79 MIL/uL — ABNORMAL LOW (ref 3.87–5.11)
RDW: 12.8 % (ref 11.5–15.5)
WBC: 8.6 10*3/uL (ref 4.0–10.5)
nRBC: 0 % (ref 0.0–0.2)

## 2021-11-24 LAB — CYTOLOGY - NON PAP

## 2021-11-24 MED ORDER — AMOXICILLIN-POT CLAVULANATE 875-125 MG PO TABS: 1.0000 | ORAL_TABLET | Freq: Two times a day (BID) | ORAL | 0 refills | Status: DC

## 2021-11-24 MED ORDER — POTASSIUM CHLORIDE CRYS ER 20 MEQ PO TBCR: 20.0000 meq | EXTENDED_RELEASE_TABLET | Freq: Once | ORAL | Status: DC

## 2021-11-24 MED ORDER — AMOXICILLIN-POT CLAVULANATE 875-125 MG PO TABS: 1.0000 | ORAL_TABLET | Freq: Two times a day (BID) | ORAL | Status: DC

## 2021-11-24 MED ORDER — OXYCODONE HCL 5 MG PO TABS: 5.0000 mg | ORAL_TABLET | ORAL | Status: DC | PRN

## 2021-11-24 MED ORDER — OXYCODONE HCL 5 MG PO TABS: 5.0000 mg | ORAL_TABLET | ORAL | 0 refills | Status: DC | PRN

## 2021-11-24 NOTE — Discharge Summary (Signed)
Physician Discharge Summary   Patient: Pamela Livingston MRN: [id number] DOB: 04-27-1965  Admit date:     11/20/2021  Discharge date: 11/24/21  Discharge Physician: Shanon Brow Heiley Shaikh   PCP: Soyla Dryer, PA-C   Recommendations at discharge:   Please follow up with primary care provider within 1-2 weeks  Please repeat BMP and CBC in one week Repeat CXR in 1-2 weeks  Please follow up on/with Dr. Pablo Ledger Course: 56 year old female with a history of COPD, peptic ulcer disease, GERD, obesity, polysubstance abuse including THC and tobacco, hypertension, and remote cocaine use presenting with pleuritic chest pain, coughing, and shortness of breath.  Notably, the patient was recently admitted to the hospital from 11/13/2021 to 11/15/2021.  At that time, the patient was treated for acute blood loss anemia secondary to GI bleed.  She underwent EGD on 11/14/2021 which showed erythematous mucosa in the gastric body and antrum there was a gastroesophageal flap valve grade 3.  There was no sign of GI bleed.  Melena was attributed to Pepto-Bismol. Patient states that her diarrhea subsequently resolved after discharge.  However she continued to have nausea and vomiting and malaise.  The patient represented with pleuritic chest discomfort and coughing.  She complains of shortness of breath and dyspnea on exertion. In the ED, the patient was afebrile and hemodynamically stable with oxygen saturation 94-98% on room air.  CT chest showed enlarging right pleural effusion with subtle pleural enhancement.  The pleural effusion was loculated.  There was concern for possible empyema.  There was worsening right basilar airspace disease with signs of consolidation in the lingula.  CT of the abdomen pelvis was negative for any acute findings.  WBC 11.9, hemoglobin 12.1, platelets 640,000.  Serum creatinine was 0.64.  LFTs were unremarkable.  The patient was started on Zosyn.  Pulmonary medicine was consulted to  assist.  Assessment and Plan: Exudative Pleural Effusion/Parapneumonic Effusion -11/21/2021 thoracocentesis--430 cc -pleural fluid WBC 2454 (79L, 14 Mono, 7 neutrophils) -pleural fluid LDH 634, protein 4.3 -pleural fluid culture--neg to date -pulmonary consult--discussed with Dr. Janean Sark clinical improvement, invasive intervention not felt necessary -continue empiric zosyn>>d/c home with amox/clav x 10 more days -11/23/21 CXR smaller right pleural effusion   Lobar Pneumonia -CT chest with worsen RLL airspace disease -continue zosyn>>d/c home with amox/clav x 10 more days -follow pleural fluid culture--neg   Intractable nausea and vomiting/Cannabis Hyperemesis Syndrome -Started pantoprazole twice daily -11/14/2021 EGD as discussed above -Suspect prolonged viral gastroenteritis -overall improved>>tolerating diet   Obesity -BMI 32.24 -Lifestyle modification   Tobacco abuse -Tobacco cessation discussed   Hyperlipidemia -Continue statin   Hypokalemia -Repleted -Magnesium 1.9   COPD, without acute exacerbation Patient currently without wheezing on physical exam. -Continue pharmacy substitution for Wixela inhaler -DuoNebs as needed for shortness of breath/wheezing             Consultants: pulmonary Procedures performed: thoracocentesis 10/31  Disposition: Home Diet recommendation:  Cardiac diet DISCHARGE MEDICATION: Allergies as of 11/24/2021       Reactions   Asa [aspirin] Anaphylaxis, Hives, Swelling   Codeine Nausea Only        Medication List     TAKE these medications    acetaminophen 500 MG tablet Commonly known as: TYLENOL Take 1,500 mg by mouth every 6 (six) hours as needed for moderate pain.   albuterol 108 (90 Base) MCG/ACT inhaler Commonly known as: VENTOLIN HFA INHALE 2 PUFFS BY MOUTH EVERY 6 HOURS AS NEEDED FOR COUGHING, WHEEZING, OR  SHORTNESS OF BREATH What changed: See the new instructions.   albuterol (2.5 MG/3ML) 0.083%  nebulizer solution Commonly known as: PROVENTIL INHALE 1 VIAL VIA NEBULIZER EVERY 6 HOURS AS NEEDED FOR WHEEZING OR SHORTNESS OF BREATH What changed: See the new instructions.   amoxicillin-clavulanate 875-125 MG tablet Commonly known as: AUGMENTIN Take 1 tablet by mouth every 12 (twelve) hours.   atorvastatin 20 MG tablet Commonly known as: LIPITOR TAKE 1 Tablet BY MOUTH ONCE DAILY   diphenhydrAMINE 25 MG tablet Commonly known as: BENADRYL Take 50 mg by mouth at bedtime.   guaiFENesin 600 MG 12 hr tablet Commonly known as: MUCINEX Take 600 mg by mouth daily.   loratadine-pseudoephedrine 10-240 MG 24 hr tablet Commonly known as: CLARITIN-D 24-hour Take 1 tablet by mouth in the morning.   montelukast 10 MG tablet Commonly known as: SINGULAIR TAKE 1 Tablet BY MOUTH ONCE DAILY AS NEEDED What changed: when to take this   nitroGLYCERIN 0.4 MG SL tablet Commonly known as: NITROSTAT Place 1 tablet (0.4 mg total) under the tongue every 5 (five) minutes as needed for chest pain.   oxyCODONE 5 MG immediate release tablet Commonly known as: Oxy IR/ROXICODONE Take 1 tablet (5 mg total) by mouth every 4 (four) hours as needed for moderate pain.   pantoprazole 40 MG tablet Commonly known as: Protonix Take 1 tablet (40 mg total) by mouth 2 (two) times daily.   Wixela Inhub 100-50 MCG/ACT Aepb Generic drug: fluticasone-salmeterol Inhale 1 puff into the lungs 2 (two) times daily.        Discharge Exam: Filed Weights   11/20/21 1439  Weight: 72.4 kg   HEENT:  [place]/AT, No thrush, no icterus CV:  RRR, no rub, no S3, no S4 Lung:  right basilar crackles. L-CTA Abd:  soft/+BS, NT Ext:  No edema, no lymphangitis, no synovitis, no rash   Condition at discharge: stable  The results of significant diagnostics from this hospitalization (including imaging, microbiology, ancillary and laboratory) are listed below for reference.   Imaging Studies: DG Chest 2 View  Result Date:  11/23/2021 CLINICAL DATA:  Pleural effusion EXAM: CHEST - 2 VIEW COMPARISON:  Chest x-ray dated November 20, 2021 FINDINGS: Visualized cardiac and mediastinal contours are unchanged. Stable small right pleural effusion. Lower lung linear opacities which are likely due to atelectasis, unchanged. No evidence of pneumothorax. IMPRESSION: Stable small right pleural effusion. Electronically Signed   By: Yetta Glassman M.D.   On: 11/23/2021 08:29   DG Chest 1 View  Result Date: 11/20/2021 CLINICAL DATA:  Post RIGHT thoracentesis EXAM: CHEST  1 VIEW COMPARISON:  Repeat exam 1403 hours compared to 1007 hours FINDINGS: Upper normal size of cardiac silhouette. RIGHT basilar effusion and atelectasis, accentuated by expiratory technique. No pneumothorax post thoracentesis. Linear subsegmental atelectasis LEFT lower lobe unchanged. IMPRESSION: No pneumothorax following RIGHT thoracentesis. Electronically Signed   By: Lavonia Dana M.D.   On: 11/20/2021 14:29   US THORACENTESIS ASP PLEURAL SPACE W/IMG GUIDE  Result Date: 11/20/2021 INDICATION: Partially loculated RIGHT pleural effusion EXAM: ULTRASOUND GUIDED DIAGNOSTIC RIGHT THORACENTESIS MEDICATIONS: None. COMPLICATIONS: None immediate. PROCEDURE: An ultrasound guided thoracentesis was thoroughly discussed with the patient and questions answered. The benefits, risks, alternatives and complications were also discussed. The patient understands and wishes to proceed with the procedure. Written consent was obtained. Ultrasound was performed to localize and mark an adequate pocket of fluid in the RIGHT chest. The area was then prepped and draped in the normal sterile fashion. 1% Lidocaine was  used for local anesthesia. Under ultrasound guidance a 19 gauge, 7-cm, Yueh catheter was introduced. Thoracentesis was performed. The catheter was removed and a dressing applied. FINDINGS: A total of approximately 430 cc of clear amber colored fluid was removed. Samples were sent to  the laboratory as requested by the clinical team. IMPRESSION: Successful ultrasound guided RIGHT thoracentesis yielding 430 cc of pleural fluid. Electronically Signed   By: Lavonia Dana M.D.   On: 11/20/2021 14:20   CT CHEST ABDOMEN PELVIS W CONTRAST  Result Date: 11/20/2021 CLINICAL DATA:  A 56 year old female presents with nausea vomiting and weight loss. History of 10 pound weight loss and RIGHT-sided chest and abdominal pain for 2 weeks associated with nausea and vomiting. EXAM: CT CHEST, ABDOMEN, AND PELVIS WITH CONTRAST TECHNIQUE: Multidetector CT imaging of the chest, abdomen and pelvis was performed following the standard protocol during bolus administration of intravenous contrast. RADIATION DOSE REDUCTION: This exam was performed according to the departmental dose-optimization program which includes automated exposure control, adjustment of the mA and/or kV according to patient size and/or use of iterative reconstruction technique. CONTRAST:  119m OMNIPAQUE IOHEXOL 300 MG/ML  SOLN COMPARISON:  November 13, 2021 and October 24, 2020. FINDINGS: CT CHEST FINDINGS Cardiovascular: Calcified and noncalcified aortic atherosclerotic plaque is mild. No aneurysmal dilation of the thoracic aorta. Normal heart size without pericardial effusion. Normal caliber of central pulmonary vessels. Mediastinum/Nodes: No thoracic inlet, axillary, mediastinal or hilar adenopathy. Esophagus grossly normal. Lungs/Pleura: Enlarging RIGHT-sided pleural effusion, now moderately large, previously small with developing sub pulmonic component. Worsening RIGHT basilar airspace disease. Pleural effusion appears loculated. Subtle pleural enhancement may be developing though there is no thick rind i and n there is no gas within the pleural fluid. Signs of lingular consolidation which has developed since previous imaging. Airways are patent aerated portions of the lung. No debris or material in the trachea. LEFT upper lobe pulmonary nodule  (image 26/3). 6 mm. Small 3 mm nodule in the RIGHT upper lobe at the periphery. Musculoskeletal: No chest wall mass. CT ABDOMEN PELVIS FINDINGS Hepatobiliary: Post cholecystectomy. Mild fissural widening of hepatic fissures. No focal, suspicious hepatic lesion. The portal vein is patent. No biliary duct distension. Pancreas: Mild pancreatic atrophy.  No peripancreatic stranding. Spleen: Normal. Adrenals/Urinary Tract: Adrenal glands are unremarkable. Symmetric renal enhancement. No sign of hydronephrosis. No suspicious renal lesion or perinephric stranding. Urinary bladder is grossly unremarkable. Stomach/Bowel: No stranding adjacent to the stomach. No sign of small bowel obstruction or inflammation. Appendix not visualized, no secondary signs to suggest acute appendicitis. No stranding adjacent to the colon. Vascular/Lymphatic: Aortic atherosclerosis. No sign of aneurysm. Smooth contour of the IVC. There is no gastrohepatic or hepatoduodenal ligament lymphadenopathy. No retroperitoneal or mesenteric lymphadenopathy. No pelvic sidewall lymphadenopathy. Atherosclerotic changes are mild. Reproductive: Post tubal ligation. Other: No ascites.  No pneumoperitoneum. Musculoskeletal: Spinal degenerative changes. Degenerative changes in the hips LEFT greater than RIGHT. No acute or destructive bone findings. IMPRESSION: 1. Enlarging RIGHT-sided pleural effusion, now moderately large, previously small with developing sub pulmonic component. Subtle pleural enhancement may be developing though there is no thick rind in there is no gas within the pleural fluid. Findings may reflect 2. Developing empyema is difficult to exclude given loculated appearance and subtle pleural enhancement. 3. Worsening RIGHT basilar airspace disease and signs of consolidation in the lingula. Findings may reflect multifocal pneumonia in the appropriate clinical setting. 6 mm LEFT upper lobe pulmonary nodule. Non-contrast chest CT at 3-6 months is  recommended. If the nodules  are stable at time of repeat CT, then future CT at 18-24 months (from today's scan) is considered optional for low-risk patients, but is recommended for high-risk patients. This recommendation follows the consensus statement: Guidelines for Management of Incidental Pulmonary Nodules Detected on CT Images: From the Fleischner Society 2017; Radiology 2017; 284:228-243. 4. No acute findings in the abdomen or pelvis. 5. Post cholecystectomy. 6. Mild fissural widening of hepatic fissures. Correlate with any clinical or laboratory evidence of liver disease. 7. Aortic atherosclerosis. Aortic Atherosclerosis (ICD10-I70.0). Electronically Signed   By: Zetta Bills M.D.   On: 11/20/2021 11:51   DG Chest 2 View  Result Date: 11/20/2021 CLINICAL DATA:  Right chest pain EXAM: CHEST - 2 VIEW COMPARISON:  Previous studies including the examination of 11/20/2018 FINDINGS: Transverse diameter of heart is slightly increased. There are no signs of alveolar pulmonary edema. Moderate right pleural effusion is seen. Linear densities are seen in the lower lung fields suggesting subsegmental atelectasis. Possibility of underlying pneumonia in right lower lung field is not excluded. Overall, no significant interval changes are noted. Left lateral CP angle is unremarkable. There is no pneumothorax. IMPRESSION: Moderate right pleural effusion. Possible underlying atelectasis/pneumonia in right lower lung field. Small linear densities are seen in the lower lung fields on both sides suggesting subsegmental atelectasis. No significant interval changes are noted. Electronically Signed   By: Elmer Picker M.D.   On: 11/20/2021 10:23   DG Chest 2 View  Result Date: 11/19/2021 CLINICAL DATA:  Chest pain EXAM: CHEST - 2 VIEW COMPARISON:  11/09/2021 FINDINGS: Layering right pleural effusion with right lower lobe atelectasis or infiltrate. No confluent opacity on the left. Heart is borderline in size. No  acute bony abnormality. IMPRESSION: Right pleural effusion with right lower lobe atelectasis or infiltrate. Electronically Signed   By: Rolm Baptise M.D.   On: 11/19/2021 21:04   CT ABDOMEN PELVIS W CONTRAST  Result Date: 11/13/2021 CLINICAL DATA:  Abdominal pain EXAM: CT ABDOMEN AND PELVIS WITH CONTRAST TECHNIQUE: Multidetector CT imaging of the abdomen and pelvis was performed using the standard protocol following bolus administration of intravenous contrast. RADIATION DOSE REDUCTION: This exam was performed according to the departmental dose-optimization program which includes automated exposure control, adjustment of the mA and/or kV according to patient size and/or use of iterative reconstruction technique. CONTRAST:  69m OMNIPAQUE IOHEXOL 350 MG/ML SOLN COMPARISON:  None Available. FINDINGS: Lower chest: Small right pleural effusion with associated passive atelectasis. Hepatobiliary: Hepatic steatosis. Liver has a contour. No focal liver lesions are seen. Portal veins are contrast opacified. Gallbladder is surgically absent. No evidence of intra or extrahepatic biliary duct dilatation. Pancreas: No evidence of Peri pancreatic fat stranding. No evidence of pancreatic ductal dilatation. Spleen: Spleen is normal in size. No evidence of focal lesion or perisplenic fluid. Adrenals/Urinary Tract: Bilateral adrenal glands are normal in appearance. Evidence of hydronephrosis or nephrolithiasis. No focal renal lesions are visualized. There are nonspecific calcifications along the course of the right ureter, possibly related to prior embolization procedure. The urinary bladder is fluid-filled without evidence of focal wall thickening. Stomach/Bowel: The stomach, small bowel, large bowel are normal in caliber without evidence of bowel obstruction. There is diverticulosis without definitive evidence of diverticulitis. Status post appendectomy. Vascular/Lymphatic: No significant vascular findings are present. No  enlarged abdominal or pelvic lymph nodes. Reproductive: Uterus and bilateral adnexa are unremarkable. Other: No abdominal wall hernia or abnormality. No abdominopelvic ascites. Musculoskeletal: No acute or significant osseous findings. IMPRESSION: No finding below the diaphragm  to explain abdominal pain. See separately dictated CT of the chest. Electronically Signed   By: Marin Roberts M.D.   On: 11/13/2021 13:47   CT Angio Chest PE W and/or Wo Contrast  Result Date: 11/13/2021 CLINICAL DATA:  Right sided pain, emesis EXAM: CT ANGIOGRAPHY CHEST WITH CONTRAST TECHNIQUE: Multidetector CT imaging of the chest was performed using the standard protocol during bolus administration of intravenous contrast. Multiplanar CT image reconstructions and MIPs were obtained to evaluate the vascular anatomy. RADIATION DOSE REDUCTION: This exam was performed according to the departmental dose-optimization program which includes automated exposure control, adjustment of the mA and/or kV according to patient size and/or use of iterative reconstruction technique. CONTRAST:  22m OMNIPAQUE IOHEXOL 350 MG/ML SOLN COMPARISON:  12/12/2019 FINDINGS: Cardiovascular: Heart size normal. No pericardial effusion. The RV is nondilated. Scattered coronary calcifications. Adequate contrast opacification of the thoracic aorta with no evidence of dissection, aneurysm, or stenosis. There is classic 3-vessel brachiocephalic arch anatomy without proximal stenosis. Scattered partially calcified plaque in the descending thoracic aorta. Mediastinum/Nodes: No mass or adenopathy. Lungs/Pleura: New small right pleural effusion. No pneumothorax. Dependent atelectasis at the right lung base. Platelike linear scarring or subsegmental atelectasis in the left lower lobe as before. Upper Abdomen: No acute findings. Musculoskeletal: Mild spondylitic changes in the lower cervical, mid and lower thoracic spine. No acute findings. Review of the MIP images confirms  the above findings. IMPRESSION: 1. Negative for acute PE or thoracic aortic dissection. 2. New small right pleural effusion. 3. Coronary and  aortic Atherosclerosis (ICD10-I70.0). Electronically Signed   By: DLucrezia EuropeM.D.   On: 11/13/2021 13:31   DG Chest 2 View  Result Date: 11/09/2021 CLINICAL DATA:  Mid chest tightness. Shortness of breath and nausea. Vomiting yesterday. EXAM: CHEST - 2 VIEW COMPARISON:  Chest two views 05/17/2021 and 12/20/2019 FINDINGS: Cardiac silhouette is again mildly enlarged. Mediastinal contours are within normal limits with mild calcification within aortic arch. New right mid to lower lung horizontal linear subsegmental atelectasis. No focal airspace opacity to indicate pneumonia. No pleural effusion or pneumothorax. Mild-to-moderate multilevel degenerative disc changes of the thoracic spine. Likely cholecystectomy clips. IMPRESSION: New right mid to lower lung subsegmental atelectasis. No radiographic evidence of pneumonia. Electronically Signed   By: RYvonne KendallM.D.   On: 11/09/2021 12:37    Microbiology: Results for orders placed or performed during the hospital encounter of 11/20/21  Culture, body fluid w Gram Stain-bottle     Status: None (Preliminary result)   Collection Time: 11/20/21  1:36 PM   Specimen: Pleura  Result Value Ref Range Status   Specimen Description PLEURAL BOTTLES DRAWN AEROBIC AND ANAEROBIC  Final   Special Requests Blood Culture adequate volume  Final   Culture   Final    NO GROWTH 4 DAYS [facility] [address] [place] [id number]   Report Status PENDING  Incomplete  Gram stain     Status: None   Collection Time: 11/20/21  1:36 PM   Specimen: Pleura  Result Value Ref Range Status   Specimen Description PLEURAL  Final   Special Requests NONE  Final   Gram Stain   Final    NO ORGANISMS SEEN WBC PRESENT, PREDOMINANTLY MONONUCLEAR CYTOSPIN SMEAR [facility] [address] [place] [id number]   Report Status 11/20/2021 FINAL  Final    Labs: CBC: Recent Labs  Lab 11/20/21 0959 11/20/21 1512 11/21/21 0307 11/22/21 0409 11/23/21 0340 11/24/21  0343  WBC 11.9* 10.1 7.0 7.9 7.2 8.6  NEUTROABS 8.7*  --   --   --   --   --   HGB 12.1 11.1* 10.3* 11.3* 10.7* 11.7*  HCT 36.0 32.7* 30.8* 32.1* 31.7* 35.7*  MCV 93.0 92.4 94.2 92.8 93.2 94.2  PLT 640* 671* 625* 701* 682* 709*   Basic Metabolic Panel: Recent Labs  Lab 11/20/21 0959 11/20/21 1512 11/21/21 0307 11/22/21 0409 11/23/21 0340 11/24/21 0343  NA 135  --  136 136 136 139  K 3.3*  --  3.2* 3.9 3.6 3.4*  CL 100  --  104 104 101 99  CO2 20*  --  23 21* 25 24  GLUCOSE 90  --  80 85 98 115*  BUN 13  --  8 <5* 5* 7  CREATININE 0.64 0.63 0.62 0.53 0.59 0.65  CALCIUM 8.6*  --  8.1* 8.4* 8.7* 9.3  MG 1.5* 2.2  --  1.9 1.7  --   PHOS  --  3.3  --   --   --   --    Liver Function Tests: Recent Labs  Lab 11/20/21 0959  AST 17  ALT 11  ALKPHOS 137*  BILITOT 0.6  PROT 7.2  ALBUMIN 2.9*   CBG: No results for input(s): "GLUCAP" in the last 168 hours.  Discharge time spent: greater than 30 minutes.  Signed: Orson Eva, MD [facility] 11/24/2021

## 2021-11-24 NOTE — Progress Notes (Signed)
Nsg Discharge Note  Admit Date:  11/20/2021 Discharge date: 11/24/2021   TANEAH MASRI to be D/C'd Home per MD order.  AVS completed.  Patient/caregiver able to verbalize understanding.  Discharge Medication: Allergies as of 11/24/2021       Reactions   Asa [aspirin] Anaphylaxis, Hives, Swelling   Codeine Nausea Only        Medication List     TAKE these medications    acetaminophen 500 MG tablet Commonly known as: TYLENOL Take 1,500 mg by mouth every 6 (six) hours as needed for moderate pain.   albuterol 108 (90 Base) MCG/ACT inhaler Commonly known as: VENTOLIN HFA INHALE 2 PUFFS BY MOUTH EVERY 6 HOURS AS NEEDED FOR COUGHING, WHEEZING, OR SHORTNESS OF BREATH What changed: See the new instructions.   albuterol (2.5 MG/3ML) 0.083% nebulizer solution Commonly known as: PROVENTIL INHALE 1 VIAL VIA NEBULIZER EVERY 6 HOURS AS NEEDED FOR WHEEZING OR SHORTNESS OF BREATH What changed: See the new instructions.   amoxicillin-clavulanate 875-125 MG tablet Commonly known as: AUGMENTIN Take 1 tablet by mouth every 12 (twelve) hours.   atorvastatin 20 MG tablet Commonly known as: LIPITOR TAKE 1 Tablet BY MOUTH ONCE DAILY   diphenhydrAMINE 25 MG tablet Commonly known as: BENADRYL Take 50 mg by mouth at bedtime.   guaiFENesin 600 MG 12 hr tablet Commonly known as: MUCINEX Take 600 mg by mouth daily.   loratadine-pseudoephedrine 10-240 MG 24 hr tablet Commonly known as: CLARITIN-D 24-hour Take 1 tablet by mouth in the morning.   montelukast 10 MG tablet Commonly known as: SINGULAIR TAKE 1 Tablet BY MOUTH ONCE DAILY AS NEEDED What changed: when to take this   nitroGLYCERIN 0.4 MG SL tablet Commonly known as: NITROSTAT Place 1 tablet (0.4 mg total) under the tongue every 5 (five) minutes as needed for chest pain.   oxyCODONE 5 MG immediate release tablet Commonly known as: Oxy IR/ROXICODONE Take 1 tablet (5 mg total) by mouth every 4 (four) hours as needed for moderate  pain.   pantoprazole 40 MG tablet Commonly known as: Protonix Take 1 tablet (40 mg total) by mouth 2 (two) times daily.   Wixela Inhub 100-50 MCG/ACT Aepb Generic drug: fluticasone-salmeterol Inhale 1 puff into the lungs 2 (two) times daily.        Discharge Assessment: Vitals:   11/24/21 0613 11/24/21 0737  BP: 127/82   Pulse: 76   Resp: 20   Temp: 98 F (36.7 C)   SpO2: 96% 97%   Skin clean, dry and intact without evidence of skin break down, no evidence of skin tears noted. IV catheter discontinued intact. Site without signs and symptoms of complications - no redness or edema noted at insertion site, patient denies c/o pain - only slight tenderness at site.  Dressing with slight pressure applied.  D/c Instructions-Education: Discharge instructions given to patient/family with verbalized understanding. D/c education completed with patient/family including follow up instructions, medication list, d/c activities limitations if indicated, with other d/c instructions as indicated by MD - patient able to verbalize understanding, all questions fully answered. Patient instructed to return to ED, call 911, or call MD for any changes in condition.  Patient escorted via [place], and D/C home via private auto.  Kathie Rhodes, RN 11/24/2021 10:34 AM

## 2021-11-24 NOTE — Progress Notes (Signed)
Pt awake majority of the night. Pt has complained of 10/10 pain, PRN morphine given with no relief. Clear lung sounds noted bilaterally.

## 2021-11-24 NOTE — TOC Transition Note (Signed)
Transition of Care [facility]) - CM/SW Discharge Note   Patient Details  Name: Pamela Livingston MRN: [id number] Date of Birth: [birth year]  [facility] CM/SW Contact:  Shade Flood, LCSW Phone Number: 11/24/2021, 10:26 AM   Clinical Narrative:     Pt stable for dc today per MD. No TOC needs identified for dc.  Final next level of care: Home/Self Care Barriers to Discharge: Barriers Resolved   Patient Goals and CMS Choice Patient states their goals for this hospitalization and ongoing recovery are:: return home   Choice offered to / list presented to : Patient  Discharge Placement                       Discharge Plan and Services In-house Referral: Clinical Social Work                                   Social Determinants of Health ([place]) Interventions     Readmission Risk Interventions    11/21/2021    8:34 AM  Readmission Risk Prevention Plan  Transportation Screening Complete  Home Care Screening Complete  Medication Review (RN CM) Complete

## 2021-11-25 LAB — CULTURE, BODY FLUID W GRAM STAIN -BOTTLE
Culture: NO GROWTH
Special Requests: ADEQUATE

## 2021-11-27 ENCOUNTER — Other Ambulatory Visit: Payer: Self-pay | Admitting: Physician Assistant

## 2021-11-27 DIAGNOSIS — E785 Hyperlipidemia, unspecified: Secondary | ICD-10-CM

## 2021-11-27 DIAGNOSIS — D62 Acute posthemorrhagic anemia: Secondary | ICD-10-CM

## 2021-11-27 DIAGNOSIS — I10 Essential (primary) hypertension: Secondary | ICD-10-CM

## 2021-12-04 ENCOUNTER — Ambulatory Visit (HOSPITAL_COMMUNITY)
Admission: RE | Admit: 2021-12-04 | Discharge: 2021-12-04 | Disposition: A | Discharge status: Home / Self Care | Payer: Self-pay | Source: Ambulatory Visit | Attending: Pulmonary Disease | Admitting: Pulmonary Disease

## 2021-12-04 DIAGNOSIS — J90 Pleural effusion, not elsewhere classified: Secondary | ICD-10-CM | POA: Insufficient documentation

## 2021-12-12 ENCOUNTER — Ambulatory Visit: Payer: Medicaid Other | Admitting: Physician Assistant

## 2021-12-12 ENCOUNTER — Encounter: Payer: Self-pay | Admitting: Physician Assistant

## 2021-12-12 DIAGNOSIS — M545 Low back pain, unspecified: Secondary | ICD-10-CM

## 2021-12-12 DIAGNOSIS — M79605 Pain in left leg: Secondary | ICD-10-CM

## 2021-12-12 DIAGNOSIS — J069 Acute upper respiratory infection, unspecified: Secondary | ICD-10-CM

## 2021-12-12 MED ORDER — GABAPENTIN 300 MG PO CAPS: 300.0000 mg | ORAL_CAPSULE | Freq: Two times a day (BID) | ORAL | 0 refills | Status: DC

## 2021-12-12 NOTE — Progress Notes (Signed)
There were no vitals taken for this visit.   Subjective:    Patient ID: Pamela Livingston, female    DOB: 07-05-1965, 56 y.o.   MRN: [id number]  HPI: Pamela Livingston is a 56 y.o. female presenting on 12/12/2021 for No chief complaint on file.   HPI   This is a telemedicine appointment through [employer]  I connected with  SWATHI DAUPHIN on 12/12/21 by a video enabled telemedicine application and verified that I am speaking with the correct person using two identifiers.   I discussed the limitations of evaluation and management by telemedicine. The patient expressed understanding and agreed to proceed.   Pt is at home.  Provider is at office.     Pt appointment is due to she called reporting being sick.  She says she Started feeling bad yesterday when she got up  She complains of Drainage and EA and ST and Lots of pressure.  She denies coughing, sob, fever.  She has Not been smoking 1 1/2 months.  No cigarettes or MJ.  She says it is so wonderful because now she isn't coughing all the time.   She is Using claritin D, Using inhaler and nebulizer- fluticasone and albuterol.     She finiished 2 wk of augmentin on Friday 3 days ago.    She says she is Not sleeping due to legs and back  She is in process of getting custody of 17monthold grandson.   She keeps her 118yo grandson every weekend.  She had f/u cxr last Monday.  She has f/u OV with pulmonology December 14  She didn't do a covid test since she started feeling bad yesterday.      Relevant past medical, surgical, family and social history reviewed and updated as indicated. Interim medical history since our last visit reviewed. Allergies and medications reviewed and updated.   Current Outpatient Medications:    albuterol (PROVENTIL) (2.5 MG/3ML) 0.083% nebulizer solution, INHALE 1 VIAL VIA NEBULIZER EVERY 6 HOURS AS NEEDED FOR WHEEZING OR SHORTNESS OF BREATH (Patient taking differently: Take 2.5 mg by nebulization every 6 (six)  hours as needed for wheezing or shortness of breath.), Disp: 225 mL, Rfl: 0   albuterol (VENTOLIN HFA) 108 (90 Base) MCG/ACT inhaler, INHALE 2 PUFFS BY MOUTH EVERY 6 HOURS AS NEEDED FOR COUGHING, WHEEZING, OR SHORTNESS OF BREATH (Patient taking differently: Inhale 2 puffs into the lungs every 6 (six) hours as needed for shortness of breath or wheezing.), Disp: 20.1 g, Rfl: 0   diphenhydrAMINE (BENADRYL) 25 MG tablet, Take 50 mg by mouth at bedtime., Disp: , Rfl:    fluticasone-salmeterol (WIXELA INHUB) 100-50 MCG/ACT AEPB, Inhale 1 puff into the lungs 2 (two) times daily., Disp: , Rfl:    guaiFENesin (MUCINEX) 600 MG 12 hr tablet, Take 600 mg by mouth daily., Disp: , Rfl:    loratadine-pseudoephedrine (CLARITIN-D 24-HOUR) 10-240 MG 24 hr tablet, Take 1 tablet by mouth in the morning. , Disp: , Rfl:    montelukast (SINGULAIR) 10 MG tablet, TAKE 1 Tablet BY MOUTH ONCE DAILY AS NEEDED (Patient taking differently: Take 10 mg by mouth daily at 6 (six) AM.), Disp: 90 tablet, Rfl: 0   acetaminophen (TYLENOL) 500 MG tablet, Take 1,500 mg by mouth every 6 (six) hours as needed for moderate pain., Disp: , Rfl:    atorvastatin (LIPITOR) 20 MG tablet, TAKE 1 Tablet BY MOUTH ONCE DAILY (Patient taking differently: Take 20 mg by mouth daily.), Disp: 90 tablet,  Rfl: 0   nitroGLYCERIN (NITROSTAT) 0.4 MG SL tablet, Place 1 tablet (0.4 mg total) under the tongue every 5 (five) minutes as needed for chest pain., Disp: 25 tablet, Rfl: prn   pantoprazole (PROTONIX) 40 MG tablet, Take 1 tablet (40 mg total) by mouth 2 (two) times daily., Disp: 30 tablet, Rfl: 1    Review of Systems  Per HPI unless specifically indicated above     Objective:    There were no vitals taken for this visit.  Wt Readings from Last 3 Encounters:  11/20/21 159 lb 9.6 oz (72.4 kg)  11/13/21 165 lb (74.8 kg)  11/09/21 165 lb (74.8 kg)    Physical Exam Constitutional:      General: She is not in acute distress.    Appearance: She is  not toxic-appearing.  HENT:     Head: Normocephalic and atraumatic.  Pulmonary:     Effort: Pulmonary effort is normal. No tachypnea.     Comments: Pt is speaking in complete sentences without dyspnea.  No witnessed coughing or wheezing. Neurological:     Mental Status: She is alert and oriented to person, place, and time.  Psychiatric:        Behavior: Behavior normal.           Assessment & Plan:    Encounter Diagnoses  Name Primary?   Upper respiratory tract infection, unspecified type Yes   Pain of left lower extremity    Chronic low back pain, unspecified back pain laterality, unspecified whether sciatica present     -discussed with pt likely viral.  Recommended she rest, fluids, OTCs prn.  Home Covid test suggested. -rx Gabapentin sent per pt request.  She was on this in past and says it helped.   -discussed that she saw orthopedist earlier this year with follow up as needed.  Recommend she call to get follow up appointment.  -she has routine follow up in this office next week.

## 2021-12-18 ENCOUNTER — Inpatient Hospital Stay (HOSPITAL_COMMUNITY): Admission: RE | Admit: 2021-12-18 | Payer: Medicaid Other | Source: Ambulatory Visit

## 2021-12-18 ENCOUNTER — Other Ambulatory Visit (HOSPITAL_COMMUNITY)
Admission: RE | Admit: 2021-12-18 | Discharge: 2021-12-18 | Disposition: A | Discharge status: Home / Self Care | Payer: Medicaid Other | Source: Ambulatory Visit | Attending: Physician Assistant | Admitting: Physician Assistant

## 2021-12-18 DIAGNOSIS — E785 Hyperlipidemia, unspecified: Secondary | ICD-10-CM | POA: Insufficient documentation

## 2021-12-18 DIAGNOSIS — I10 Essential (primary) hypertension: Secondary | ICD-10-CM | POA: Insufficient documentation

## 2021-12-18 DIAGNOSIS — D62 Acute posthemorrhagic anemia: Secondary | ICD-10-CM | POA: Insufficient documentation

## 2021-12-18 LAB — CBC
HCT: 34.8 % — ABNORMAL LOW (ref 36.0–46.0)
Hemoglobin: 11.6 g/dL — ABNORMAL LOW (ref 12.0–15.0)
MCH: 30.9 pg (ref 26.0–34.0)
MCHC: 33.3 g/dL (ref 30.0–36.0)
MCV: 92.8 fL (ref 80.0–100.0)
Platelets: 505 10*3/uL — ABNORMAL HIGH (ref 150–400)
RBC: 3.75 MIL/uL — ABNORMAL LOW (ref 3.87–5.11)
RDW: 14 % (ref 11.5–15.5)
WBC: 7 10*3/uL (ref 4.0–10.5)
nRBC: 0 % (ref 0.0–0.2)

## 2021-12-18 LAB — COMPREHENSIVE METABOLIC PANEL
ALT: 11 U/L (ref 0–44)
AST: 15 U/L (ref 15–41)
Albumin: 3.8 g/dL (ref 3.5–5.0)
Alkaline Phosphatase: 83 U/L (ref 38–126)
Anion gap: 9 (ref 5–15)
BUN: 19 mg/dL (ref 6–20)
CO2: 24 mmol/L (ref 22–32)
Calcium: 9.1 mg/dL (ref 8.9–10.3)
Chloride: 105 mmol/L (ref 98–111)
Creatinine, Ser: 0.67 mg/dL (ref 0.44–1.00)
GFR, Estimated: >60 mL/min (ref >60)
Glucose, Bld: 104 mg/dL — ABNORMAL HIGH (ref 70–99)
Potassium: 3.3 mmol/L — ABNORMAL LOW (ref 3.5–5.1)
Sodium: 138 mmol/L (ref 135–145)
Total Bilirubin: 0.4 mg/dL (ref 0.3–1.2)
Total Protein: 7.3 g/dL (ref 6.5–8.1)

## 2021-12-18 LAB — LIPID PANEL
Cholesterol: 187 mg/dL (ref 0–200)
HDL: 49 mg/dL (ref >40)
LDL Cholesterol: 111 mg/dL — ABNORMAL HIGH (ref 0–99)
Total CHOL/HDL Ratio: 3.8 RATIO
Triglycerides: 133 mg/dL (ref <150)
VLDL: 27 mg/dL (ref 0–40)

## 2021-12-19 ENCOUNTER — Encounter: Payer: Self-pay | Admitting: Orthopaedic Surgery

## 2021-12-19 ENCOUNTER — Ambulatory Visit (INDEPENDENT_AMBULATORY_CARE_PROVIDER_SITE_OTHER): Payer: Self-pay | Admitting: Orthopaedic Surgery

## 2021-12-19 VITALS — BP 152/100 | HR 84 | Ht 59.0 in | Wt 168.0 lb

## 2021-12-19 DIAGNOSIS — G8929 Other chronic pain: Secondary | ICD-10-CM

## 2021-12-19 DIAGNOSIS — M25562 Pain in left knee: Secondary | ICD-10-CM

## 2021-12-19 DIAGNOSIS — M25561 Pain in right knee: Secondary | ICD-10-CM

## 2021-12-19 MED ORDER — METHYLPREDNISOLONE ACETATE 40 MG/ML IJ SUSP
40.0000 mg | Freq: Once | INTRAMUSCULAR | Status: AC
  Administered 2021-12-19: 40 mg via INTRA_ARTICULAR

## 2021-12-19 NOTE — Progress Notes (Signed)
My knees are hurting.  She has history of lower leg pain and lower back pain.  She had MRI of the back in May which showed some spondylolisthesis.  She has been followed at the [facility] and has been on Neurontin for that.  She has developed bilateral knee pain with more pain and swelling of the left knee.  She has no giving way, no trauma, no redness.  She uses a walker.  She has more pain after walking a distance.  ROM of the right knee is 0 to 110, crepitus, slight effusion, stable, NV intact.  ROM of the left knee is 0 to 105, effusion, crepitus, pain, limp left, stable, NV intact.  Encounter Diagnosis  Name Primary?   Chronic pain of both knees Yes   PROCEDURE NOTE:  The patient requests injections of the left knee , verbal consent was obtained.  The left knee was prepped appropriately after time out was performed.   Sterile technique was observed and injection of 1 cc of DepoMedrol 40 mg with several cc's of plain xylocaine. Anesthesia was provided by ethyl chloride and a 20-gauge needle was used to inject the knee area. The injection was tolerated well.  A band aid dressing was applied.  The patient was advised to apply ice later today and tomorrow to the injection sight as needed.  PROCEDURE NOTE:  The patient requests injections of the right knee , verbal consent was obtained.  The right knee was prepped appropriately after time out was performed.   Sterile technique was observed and injection of 1 cc of DepoMedrol '40mg'$  with several cc's of plain xylocaine. Anesthesia was provided by ethyl chloride and a 20-gauge needle was used to inject the knee area. The injection was tolerated well.  A band aid dressing was applied.  The patient was advised to apply ice later today and tomorrow to the injection sight as needed.  Return in five weeks.  Call if any problem.  Precautions discussed.  Electronically Signed Sanjuana Kava, MD 11/28/202310:47 AM

## 2021-12-20 ENCOUNTER — Encounter: Payer: Self-pay | Admitting: Physician Assistant

## 2021-12-20 ENCOUNTER — Ambulatory Visit: Payer: Medicaid Other | Admitting: Physician Assistant

## 2021-12-20 VITALS — BP 156/104 | HR 93 | Temp 97.2°F | Ht 59.0 in | Wt 156.0 lb

## 2021-12-20 DIAGNOSIS — J449 Chronic obstructive pulmonary disease, unspecified: Secondary | ICD-10-CM

## 2021-12-20 DIAGNOSIS — E785 Hyperlipidemia, unspecified: Secondary | ICD-10-CM

## 2021-12-20 DIAGNOSIS — I10 Essential (primary) hypertension: Secondary | ICD-10-CM

## 2021-12-20 DIAGNOSIS — G8929 Other chronic pain: Secondary | ICD-10-CM

## 2021-12-20 MED ORDER — AMLODIPINE BESYLATE 5 MG PO TABS: 5.0000 mg | ORAL_TABLET | Freq: Every day | ORAL | 1 refills | Status: DC

## 2021-12-20 MED ORDER — CYCLOBENZAPRINE HCL 10 MG PO TABS: 10.0000 mg | ORAL_TABLET | Freq: Three times a day (TID) | ORAL | 0 refills | Status: DC | PRN

## 2021-12-20 NOTE — Progress Notes (Signed)
BP (!) 156/104   Pulse 93   Temp (!) 97.2 F (36.2 C)   Ht '4\' 11"'$  (1.499 m)   Wt 156 lb (70.8 kg)   SpO2 95%   BMI 31.51 kg/m    Subjective:    Patient ID: Pamela Livingston, female    DOB: Oct 20, 1965, 56 y.o.   MRN: [id number]  HPI: Pamela Livingston is a 56 y.o. female presenting on 12/20/2021 for Hypertension, Hyperlipidemia, and COPD   HPI  Chief Complaint  Patient presents with   Hypertension   Hyperlipidemia   COPD     She has appointment to follow up with pulmonology in December for pleural effusion  She was seen yesterday and has appointment in January with orthopedics  She has medicaid starting December 1   She says she is feeling okay today.  Her breathing is improved ; she is no longer smoking.   Her Medicaid starts December 1.    Relevant past medical, surgical, family and social history reviewed and updated as indicated. Interim medical history since our last visit reviewed. Allergies and medications reviewed and updated.   Current Outpatient Medications:    acetaminophen (TYLENOL) 500 MG tablet, Take 1,500 mg by mouth every 6 (six) hours as needed for moderate pain., Disp: , Rfl:    albuterol (PROVENTIL) (2.5 MG/3ML) 0.083% nebulizer solution, INHALE 1 VIAL VIA NEBULIZER EVERY 6 HOURS AS NEEDED FOR WHEEZING OR SHORTNESS OF BREATH (Patient taking differently: Take 2.5 mg by nebulization every 6 (six) hours as needed for wheezing or shortness of breath.), Disp: 225 mL, Rfl: 0   albuterol (VENTOLIN HFA) 108 (90 Base) MCG/ACT inhaler, INHALE 2 PUFFS BY MOUTH EVERY 6 HOURS AS NEEDED FOR COUGHING, WHEEZING, OR SHORTNESS OF BREATH (Patient taking differently: Inhale 2 puffs into the lungs every 6 (six) hours as needed for shortness of breath or wheezing.), Disp: 20.1 g, Rfl: 0   atorvastatin (LIPITOR) 20 MG tablet, TAKE 1 Tablet BY MOUTH ONCE DAILY (Patient taking differently: Take 20 mg by mouth daily.), Disp: 90 tablet, Rfl: 0   diphenhydrAMINE (BENADRYL) 25 MG  tablet, Take 50 mg by mouth at bedtime., Disp: , Rfl:    fluticasone-salmeterol (WIXELA INHUB) 100-50 MCG/ACT AEPB, Inhale 1 puff into the lungs 2 (two) times daily., Disp: , Rfl:    gabapentin (NEURONTIN) 300 MG capsule, Take 1 capsule (300 mg total) by mouth 2 (two) times daily., Disp: 60 capsule, Rfl: 0   guaiFENesin (MUCINEX) 600 MG 12 hr tablet, Take 600 mg by mouth daily., Disp: , Rfl:    lisinopril (ZESTRIL) 20 MG tablet, Take 20 mg by mouth daily., Disp: , Rfl:    loratadine-pseudoephedrine (CLARITIN-D 24-HOUR) 10-240 MG 24 hr tablet, Take 1 tablet by mouth in the morning. , Disp: , Rfl:    montelukast (SINGULAIR) 10 MG tablet, TAKE 1 Tablet BY MOUTH ONCE DAILY AS NEEDED (Patient taking differently: Take 10 mg by mouth daily at 6 (six) AM.), Disp: 90 tablet, Rfl: 0   nitroGLYCERIN (NITROSTAT) 0.4 MG SL tablet, Place 1 tablet (0.4 mg total) under the tongue every 5 (five) minutes as needed for chest pain., Disp: 25 tablet, Rfl: prn   pantoprazole (PROTONIX) 40 MG tablet, Take 1 tablet (40 mg total) by mouth 2 (two) times daily. (Patient not taking: Reported on 12/20/2021), Disp: 30 tablet, Rfl: 1     Review of Systems  Per HPI unless specifically indicated above     Objective:    BP (!) 156/104  Pulse 93   Temp (!) 97.2 F (36.2 C)   Ht '4\' 11"'$  (1.499 m)   Wt 156 lb (70.8 kg)   SpO2 95%   BMI 31.51 kg/m   Wt Readings from Last 3 Encounters:  12/20/21 156 lb (70.8 kg)  12/19/21 168 lb (76.2 kg)  11/20/21 159 lb 9.6 oz (72.4 kg)    Physical Exam Vitals reviewed.  Constitutional:      General: She is not in acute distress.    Appearance: She is not toxic-appearing.  HENT:     Head: Normocephalic and atraumatic.  Cardiovascular:     Rate and Rhythm: Normal rate and regular rhythm.  Pulmonary:     Effort: Pulmonary effort is normal.     Breath sounds: Normal breath sounds.  Abdominal:     General: Bowel sounds are normal.     Palpations: Abdomen is soft. There is no  mass.     Tenderness: There is no abdominal tenderness.  Musculoskeletal:     Cervical back: Neck supple.     Right lower leg: No edema.     Left lower leg: No edema.  Lymphadenopathy:     Cervical: No cervical adenopathy.  Skin:    General: Skin is warm and dry.  Neurological:     Mental Status: She is alert and oriented to person, place, and time.  Psychiatric:        Behavior: Behavior normal.     Results for orders placed or performed during the hospital encounter of 12/18/21  Comprehensive metabolic panel  Result Value Ref Range   Sodium 138 135 - 145 mmol/L   Potassium 3.3 (L) 3.5 - 5.1 mmol/L   Chloride 105 98 - 111 mmol/L   CO2 24 22 - 32 mmol/L   Glucose, Bld 104 (H) 70 - 99 mg/dL   BUN 19 6 - 20 mg/dL   Creatinine, Ser 0.67 0.44 - 1.00 mg/dL   Calcium 9.1 8.9 - 10.3 mg/dL   Total Protein 7.3 6.5 - 8.1 g/dL   Albumin 3.8 3.5 - 5.0 g/dL   AST 15 15 - 41 U/L   ALT 11 0 - 44 U/L   Alkaline Phosphatase 83 38 - 126 U/L   Total Bilirubin 0.4 0.3 - 1.2 mg/dL   GFR, Estimated >60 >60 mL/min   Anion gap 9 5 - 15  Lipid panel  Result Value Ref Range   Cholesterol 187 0 - 200 mg/dL   Triglycerides 133 <150 mg/dL   HDL 49 >40 mg/dL   Total CHOL/HDL Ratio 3.8 RATIO   VLDL 27 0 - 40 mg/dL   LDL Cholesterol 111 (H) 0 - 99 mg/dL  CBC  Result Value Ref Range   WBC 7.0 4.0 - 10.5 K/uL   RBC 3.75 (L) 3.87 - 5.11 MIL/uL   Hemoglobin 11.6 (L) 12.0 - 15.0 g/dL   HCT 34.8 (L) 36.0 - 46.0 %   MCV 92.8 80.0 - 100.0 fL   MCH 30.9 26.0 - 34.0 pg   MCHC 33.3 30.0 - 36.0 g/dL   RDW 14.0 11.5 - 15.5 %   Platelets 505 (H) 150 - 400 K/uL   nRBC 0.0 0.0 - 0.2 %      Assessment & Plan:   Encounter Diagnoses  Name Primary?   Essential hypertension Yes   Hyperlipidemia, unspecified hyperlipidemia type    Chronic low back pain, unspecified back pain laterality, unspecified whether sciatica present    Chronic obstructive pulmonary disease, unspecified COPD  type ([place])    Bilateral  chronic knee pain       -reviewed labs with pt -Add amlodipine '5mg'$   for bp -continue current meds -encouraged lowfat diet to help her atorvastatin with lipids -continue with orthopedics and pulmonology  as scheduled -congratulated on smoking cessation and encouraged her to continue this -pt encouraged to get scheduled with new PCP for bp recheck in a timely fashion  with her medicaid starting 12/22/21

## 2021-12-20 NOTE — Patient Instructions (Signed)

## 2022-01-04 ENCOUNTER — Ambulatory Visit (INDEPENDENT_AMBULATORY_CARE_PROVIDER_SITE_OTHER): Payer: Medicaid Other | Admitting: Pulmonary Disease

## 2022-01-04 ENCOUNTER — Encounter: Payer: Self-pay | Admitting: Pulmonary Disease

## 2022-01-04 VITALS — BP 102/70 | HR 111 | Ht 59.0 in | Wt 153.6 lb

## 2022-01-04 DIAGNOSIS — J90 Pleural effusion, not elsewhere classified: Secondary | ICD-10-CM | POA: Diagnosis not present

## 2022-01-04 DIAGNOSIS — R911 Solitary pulmonary nodule: Secondary | ICD-10-CM | POA: Diagnosis not present

## 2022-01-04 DIAGNOSIS — J449 Chronic obstructive pulmonary disease, unspecified: Secondary | ICD-10-CM | POA: Diagnosis not present

## 2022-01-04 NOTE — Assessment & Plan Note (Signed)
Follow-up chest x-ray today to ensure that effusion has resolved

## 2022-01-04 NOTE — Addendum Note (Signed)
Addended by: Priscille Kluver on: 01/04/2022 12:29 PM   Modules accepted: Orders

## 2022-01-04 NOTE — Patient Instructions (Addendum)
  X CXR to follow up on effusion  X schedule PFTs  X CT chest wo contrast to follow up on nodule in May 2024

## 2022-01-04 NOTE — Assessment & Plan Note (Signed)
She has stopped using Wixela and albuterol. Schedule PFTs. Based on this we will describe need for bronchodilators

## 2022-01-04 NOTE — Assessment & Plan Note (Signed)
Incidental finding of 6 mm left upper lobe nodule on CT scan. She will obtain 13-monthfollow-up CT scan in May 2024

## 2022-01-04 NOTE — Progress Notes (Signed)
   Subjective:    Patient ID: Pamela Livingston, female    DOB: 10-24-1965, 56 y.o.   MRN: [id number]  HPI  56 year old smoker presented with nausea vomiting and diarrhea ongoing for 3 weeks.  She presented 10/23 with right-sided chest pain and emesis and was hospitalized CT angiogram chest showed small right pleural effusion, CT abdomen was negative She had mild leukocytosis 15.6, hemoglobin 11.8 with fecal occult blood positive. She underwent EGD 10/24 which showed gastric mucosal erythema and gastric polyp which was biopsied She returned on 10/31 with right-sided chest pain, pleuritic and intermittent nausea and vomiting. Chest x-ray showed increased right effusion.  CT chest/abdomen/pelvis showed right basilar airspace disease with large right effusion that appeared loculated.  There was pleural enhancement to suggest empyema.  6 mm left upper lobe nodule was noted She underwent thoracentesis with removal of 430 cc of clear amber-yellow fluid , lymphocytic exudate.  She was discharged on Augmentin for 10 days  She admitted to heavy marijuana use She smoked more than 30 pack years  Chief Complaint  Patient presents with   Follow-up    Pt hospital f/u, she is feeling fatigued but otherwise seem fine.    1 month follow-up visit Her breathing is much improved.  She states she has quit smoking. She has custody of her 39-monthold grandson, her daughter  is a heroin addict She denies chest pain, nausea and vomiting has resolved. She was felt to have hyperemesis from cannabis, she has stopped using this. She has Wixela and albuterol inhalers but has not needed to use this  Chest x-ray 11/13 independently reviewed shows tiny right effusion Significant tests/ events reviewed 10/30 right thoracentesis >> lymphocytic exudate   CT chest >> 659mLUL nodule   Review of Systems neg for any significant sore throat, dysphagia, itching, sneezing, nasal congestion or excess/ purulent secretions, fever,  chills, sweats, unintended wt loss, pleuritic or exertional cp, hempoptysis, orthopnea pnd or change in chronic leg swelling. Also denies presyncope, palpitations, heartburn, abdominal pain, nausea, vomiting, diarrhea or change in bowel or urinary habits, dysuria,hematuria, rash, arthralgias, visual complaints, headache, numbness weakness or ataxia.     Objective:   Physical Exam  Gen. Pleasant, obese, in no distress, normal affect ENT - no pallor,icterus, no post nasal drip, class 2-3 airway Neck: No JVD, no thyromegaly, no carotid bruits Lungs: no use of accessory muscles, no dullness to percussion, decreased without rales or rhonchi  Cardiovascular: Rhythm regular, heart sounds  normal, no murmurs or gallops, no peripheral edema Abdomen: soft and non-tender, no hepatosplenomegaly, BS normal. Musculoskeletal: No deformities, no cyanosis or clubbing Neuro:  alert, non focal, no tremors       Assessment & Plan:

## 2022-01-09 ENCOUNTER — Ambulatory Visit (HOSPITAL_COMMUNITY)
Admission: RE | Admit: 2022-01-09 | Discharge: 2022-01-09 | Disposition: A | Discharge status: Home / Self Care | Payer: Medicaid Other | Source: Ambulatory Visit | Attending: Pulmonary Disease | Admitting: Pulmonary Disease

## 2022-01-09 DIAGNOSIS — J90 Pleural effusion, not elsewhere classified: Secondary | ICD-10-CM | POA: Insufficient documentation

## 2022-01-23 ENCOUNTER — Ambulatory Visit: Payer: Medicaid Other | Admitting: Orthopaedic Surgery

## 2022-01-24 ENCOUNTER — Ambulatory Visit: Payer: Medicaid Other | Admitting: Family Medicine

## 2022-01-24 ENCOUNTER — Encounter: Payer: Self-pay | Admitting: Family Medicine

## 2022-01-24 VITALS — BP 100/72 | HR 88 | Temp 97.3°F | Ht 59.0 in | Wt 158.0 lb

## 2022-01-24 DIAGNOSIS — K219 Gastro-esophageal reflux disease without esophagitis: Secondary | ICD-10-CM

## 2022-01-24 DIAGNOSIS — E669 Obesity, unspecified: Secondary | ICD-10-CM

## 2022-01-24 DIAGNOSIS — R911 Solitary pulmonary nodule: Secondary | ICD-10-CM

## 2022-01-24 DIAGNOSIS — J449 Chronic obstructive pulmonary disease, unspecified: Secondary | ICD-10-CM

## 2022-01-24 DIAGNOSIS — F17219 Nicotine dependence, cigarettes, with unspecified nicotine-induced disorders: Secondary | ICD-10-CM | POA: Diagnosis not present

## 2022-01-24 DIAGNOSIS — Z23 Encounter for immunization: Secondary | ICD-10-CM

## 2022-01-24 DIAGNOSIS — Z7689 Persons encountering health services in other specified circumstances: Secondary | ICD-10-CM

## 2022-01-24 DIAGNOSIS — Z1231 Encounter for screening mammogram for malignant neoplasm of breast: Secondary | ICD-10-CM

## 2022-01-24 DIAGNOSIS — E785 Hyperlipidemia, unspecified: Secondary | ICD-10-CM

## 2022-01-24 DIAGNOSIS — Z0001 Encounter for general adult medical examination with abnormal findings: Secondary | ICD-10-CM

## 2022-01-24 DIAGNOSIS — I10 Essential (primary) hypertension: Secondary | ICD-10-CM

## 2022-01-24 MED ORDER — FLUTICASONE-SALMETEROL 100-50 MCG/ACT IN AEPB: 1.0000 | INHALATION_SPRAY | Freq: Two times a day (BID) | RESPIRATORY_TRACT | 3 refills | Status: DC

## 2022-01-24 MED ORDER — OMEPRAZOLE 40 MG PO CPDR: 40.0000 mg | DELAYED_RELEASE_CAPSULE | Freq: Two times a day (BID) | ORAL | 3 refills | Status: DC

## 2022-01-24 MED ORDER — ATORVASTATIN CALCIUM 20 MG PO TABS: 20.0000 mg | ORAL_TABLET | Freq: Every day | ORAL | 0 refills | Status: DC

## 2022-01-24 MED ORDER — MONTELUKAST SODIUM 10 MG PO TABS: 10.0000 mg | ORAL_TABLET | Freq: Every day | ORAL | 0 refills | Status: DC | PRN

## 2022-01-24 MED ORDER — LISINOPRIL 20 MG PO TABS: 20.0000 mg | ORAL_TABLET | Freq: Every day | ORAL | 0 refills | Status: DC

## 2022-01-24 MED ORDER — GABAPENTIN 300 MG PO CAPS: 300.0000 mg | ORAL_CAPSULE | Freq: Two times a day (BID) | ORAL | 3 refills | Status: DC

## 2022-01-24 MED ORDER — ALBUTEROL SULFATE HFA 108 (90 BASE) MCG/ACT IN AERS: 2.0000 | INHALATION_SPRAY | Freq: Four times a day (QID) | RESPIRATORY_TRACT | 0 refills | Status: DC | PRN

## 2022-01-24 NOTE — Assessment & Plan Note (Signed)
Well controlled on current regimen. Will continue Wixela and PRN Albuterol. Instructed to return to office for worsening shortness of breath, wheezing, or inability to complete activities due to dysnea.

## 2022-01-24 NOTE — Assessment & Plan Note (Signed)
Well controlled on current regimen, will recheck labs in 3 months.

## 2022-01-24 NOTE — Assessment & Plan Note (Signed)
Chronic. Well controlled on current regimen. She denies chest pain, palpitations, headaches, vision changes, and edema, encouraged to seek medical care for any of the above. Does endorse occasional lightheadedness, I encouraged her to continue home BP checks BID and report to office if she is getting readings consistently <100/60 or >130/80.

## 2022-01-24 NOTE — Assessment & Plan Note (Signed)
CT scheduled in May 2024 for follow-up. Encouraged to quit smoking and offered support when she is ready.

## 2022-01-24 NOTE — Progress Notes (Signed)
New Patient Office Visit  Subjective    Patient ID: Pamela Livingston, female    DOB: October 06, 1965  Age: 57 y.o. MRN: [id number]  CC:  Chief Complaint  Patient presents with   [place] presents to establish care. Oriented to practice routines and expectations. PMH includes HTN, COPD, HLD, arthritis, "a pinched nerve in her back", and "2 slipped discs". She sees a pulmonologist, and orthopedic, and a rheumatologist. She recently had labs done with her previous PCP in November. She requests mediation refills.  Cervical cancer screening- hysterectomy Breast cancer screening- due, will order today Lung cancer screening- nodule present, CT on 05/25/22 Colon cancer screening- 07/26/2020, 1 polyp removed, repeat 5y Current every day smoker <0.5ppd Denies ETOH, uses marijuana Declines STI testing Vaccines: flu done, Covid at pharmacy, Shingles today   Outpatient Encounter Medications as of 01/24/2022  Medication Sig   albuterol (VENTOLIN HFA) 108 (90 Base) MCG/ACT inhaler Inhale 2 puffs into the lungs every 6 (six) hours as needed for wheezing or shortness of breath.   cyclobenzaprine (FLEXERIL) 10 MG tablet Take 1 tablet (10 mg total) by mouth 3 (three) times daily as needed for muscle spasms.   nitroGLYCERIN (NITROSTAT) 0.4 MG SL tablet Place 1 tablet (0.4 mg total) under the tongue every 5 (five) minutes as needed for chest pain.   [DISCONTINUED] acetaminophen (TYLENOL) 500 MG tablet Take 1,500 mg by mouth every 6 (six) hours as needed for moderate pain.   [DISCONTINUED] albuterol (PROVENTIL) (2.5 MG/3ML) 0.083% nebulizer solution INHALE 1 VIAL VIA NEBULIZER EVERY 6 HOURS AS NEEDED FOR WHEEZING OR SHORTNESS OF BREATH (Patient taking differently: Take 2.5 mg by nebulization every 6 (six) hours as needed for wheezing or shortness of breath.)   [DISCONTINUED] albuterol (VENTOLIN HFA) 108 (90 Base) MCG/ACT inhaler INHALE 2 PUFFS BY MOUTH EVERY 6 HOURS AS NEEDED FOR COUGHING,  WHEEZING, OR SHORTNESS OF BREATH (Patient taking differently: Inhale 2 puffs into the lungs every 6 (six) hours as needed for shortness of breath or wheezing.)   [DISCONTINUED] atorvastatin (LIPITOR) 20 MG tablet TAKE 1 Tablet BY MOUTH ONCE DAILY (Patient taking differently: Take 20 mg by mouth daily.)   [DISCONTINUED] diphenhydrAMINE (BENADRYL) 25 MG tablet Take 50 mg by mouth at bedtime.   [DISCONTINUED] fluticasone-salmeterol (WIXELA INHUB) 100-50 MCG/ACT AEPB Inhale 1 puff into the lungs 2 (two) times daily.   [DISCONTINUED] gabapentin (NEURONTIN) 300 MG capsule Take 1 capsule (300 mg total) by mouth 2 (two) times daily.   [DISCONTINUED] lisinopril (ZESTRIL) 20 MG tablet Take 20 mg by mouth daily.   [DISCONTINUED] loratadine-pseudoephedrine (CLARITIN-D 24-HOUR) 10-240 MG 24 hr tablet Take 1 tablet by mouth in the morning.    [DISCONTINUED] montelukast (SINGULAIR) 10 MG tablet TAKE 1 Tablet BY MOUTH ONCE DAILY AS NEEDED (Patient taking differently: Take 10 mg by mouth daily at 6 (six) AM.)   [DISCONTINUED] omeprazole (PRILOSEC) 40 MG capsule Take 40 mg by mouth in the morning and at bedtime.   atorvastatin (LIPITOR) 20 MG tablet Take 1 tablet (20 mg total) by mouth daily.   fluticasone-salmeterol (WIXELA INHUB) 100-50 MCG/ACT AEPB Inhale 1 puff into the lungs 2 (two) times daily.   gabapentin (NEURONTIN) 300 MG capsule Take 1 capsule (300 mg total) by mouth 2 (two) times daily.   lisinopril (ZESTRIL) 20 MG tablet Take 1 tablet (20 mg total) by mouth daily.   montelukast (SINGULAIR) 10 MG tablet Take 1 tablet (10 mg total) by mouth daily as  needed.   omeprazole (PRILOSEC) 40 MG capsule Take 1 capsule (40 mg total) by mouth in the morning and at bedtime.   [DISCONTINUED] amLODipine (NORVASC) 5 MG tablet Take 1 tablet (5 mg total) by mouth daily. (Patient not taking: Reported on 01/24/2022)   [DISCONTINUED] guaiFENesin (MUCINEX) 600 MG 12 hr tablet Take 600 mg by mouth daily. (Patient not taking:  Reported on 01/24/2022)   [DISCONTINUED] pantoprazole (PROTONIX) 40 MG tablet Take 1 tablet (40 mg total) by mouth 2 (two) times daily. (Patient not taking: Reported on 12/20/2021)   No facility-administered encounter medications on file as of 01/24/2022.    Past Medical History:  Diagnosis Date   Asthma    Chronic pelvic pain in female    COPD (chronic obstructive pulmonary disease) ([place])    Endometriosis    Polysubstance abuse ([place])    marijuana, cocaine, benzos   Stomach ulcer    Suicide attempt ([place]) 2008   by phenergan overdose   Syncope     Past Surgical History:  Procedure Laterality Date   ABDOMINAL HYSTERECTOMY     BALLOON DILATION N/A 07/26/2020   Procedure: BALLOON DILATION;  Surgeon: Eloise Harman, DO;  [facility];  Service: Endoscopy;  Laterality: N/A;   BIOPSY  07/26/2020   Procedure: BIOPSY;  Surgeon: Eloise Harman, DO;  [facility];  Service: Endoscopy;;   BIOPSY  11/14/2021   Procedure: BIOPSY;  Surgeon: Gatha Mayer, MD;  Location: [facility] ENDOSCOPY;  Service: Gastroenterology;;   CERVICAL CONE BIOPSY     cervical cancer   CESAREAN SECTION  1993   CHOLECYSTECTOMY     COLONOSCOPY WITH PROPOFOL N/A 07/26/2020   Procedure: COLONOSCOPY WITH PROPOFOL;  Surgeon: Eloise Harman, DO;  [facility];  Service: Endoscopy;  Laterality: N/A;  12:00pm   ESOPHAGOGASTRODUODENOSCOPY (EGD) WITH PROPOFOL N/A 07/26/2020   Procedure: ESOPHAGOGASTRODUODENOSCOPY (EGD) WITH PROPOFOL;  Surgeon: Eloise Harman, DO;  [facility];  Service: Endoscopy;  Laterality: N/A;   ESOPHAGOGASTRODUODENOSCOPY (EGD) WITH PROPOFOL N/A 11/14/2021   Procedure: ESOPHAGOGASTRODUODENOSCOPY (EGD) WITH PROPOFOL;  Surgeon: Gatha Mayer, MD;  Location: [place];  Service: Gastroenterology;  Laterality: N/A;   RADICAL HYSTERECTOMY WITH TRANSPOSITION OF OVARIES     TOOTH EXTRACTION     TUBAL LIGATION  1993    Family History  Problem Relation Age of  Onset   Heart disease Mother    Hypertension Mother    Diabetes Mother    Rheum arthritis Mother    Heart disease Father    Hypertension Father    Heart attack Sister    Stroke Sister    Dementia Sister    Heart attack Maternal Uncle    Heart attack Maternal Uncle    COPD Paternal Grandfather    Colon cancer Neg Hx     Social History   Socioeconomic History   Marital status: Divorced    Spouse name: Not on file   Number of children: Not on file   Years of education: Not on file   Highest education level: Not on file  Occupational History   Not on file  Tobacco Use   Smoking status: Former    Packs/day: 0.50    Years: 27.00    Total pack years: 13.50    Types: Cigarettes    Quit date: 11/21/2021    Years since quitting: 0.1   Smokeless tobacco: Never   Tobacco comments:    1 cig every other day  Vaping Use   Vaping Use: Never used  Substance and Sexual Activity   Alcohol use: Not Currently   Drug use: Yes    Frequency: 7.0 times per week    Types: Marijuana   Sexual activity: Yes    Birth control/protection: Surgical  Other Topics Concern   Not on file  Social History Narrative   Not on file   Social Determinants of Health   Financial Resource Strain: Not on file  Food Insecurity: Not on file  Transportation Needs: Not on file  Physical Activity: Not on file  Stress: Not on file  Social Connections: Not on file  Intimate Partner Violence: Not on file    Review of Systems  Constitutional: Negative.   HENT: Negative.    Eyes: Negative.   Respiratory: Negative.    Cardiovascular: Negative.   Gastrointestinal: Negative.   Genitourinary: Negative.   Musculoskeletal:  Positive for back pain and joint pain.  Skin: Negative.   Neurological: Negative.   Endo/Heme/Allergies: Negative.   Psychiatric/Behavioral: Negative.    All other systems reviewed and are negative.       Objective    BP 100/72   Pulse 88   Temp (!) 97.3 F (36.3 C) (Oral)    Ht '4\' 11"'$  (1.499 m)   Wt 158 lb (71.7 kg)   SpO2 99%   BMI 31.91 kg/m    Physical Exam Vitals and nursing note reviewed.  Constitutional:      Appearance: Normal appearance. She is normal weight.  HENT:     Head: Normocephalic and atraumatic.     Right Ear: Tympanic membrane, ear canal and external ear normal.     Left Ear: Tympanic membrane, ear canal and external ear normal.     Nose: Nose normal.     Mouth/Throat:     Mouth: Mucous membranes are moist.     Pharynx: Oropharynx is clear.  Eyes:     Extraocular Movements: Extraocular movements intact.     Conjunctiva/sclera: Conjunctivae normal.     Pupils: Pupils are equal, round, and reactive to light.  Cardiovascular:     Rate and Rhythm: Normal rate and regular rhythm.     Pulses: Normal pulses.     Heart sounds: Normal heart sounds.  Pulmonary:     Effort: Pulmonary effort is normal.     Breath sounds: Normal breath sounds.  Abdominal:     General: Bowel sounds are normal.     Palpations: Abdomen is soft.  Musculoskeletal:        General: Normal range of motion.     Cervical back: Normal range of motion and neck supple.  Skin:    General: Skin is warm and dry.     Capillary Refill: Capillary refill takes less than 2 seconds.  Neurological:     General: No focal deficit present.     Mental Status: She is alert and oriented to person, place, and time. Mental status is at baseline.  Psychiatric:        Mood and Affect: Mood normal.        Behavior: Behavior normal.        Thought Content: Thought content normal.        Judgment: Judgment normal.         Assessment & Plan:   Problem List Items Addressed This Visit       Cardiovascular and Mediastinum   Hypertension    Chronic. Well controlled on current regimen. She denies chest pain,  palpitations, headaches, vision changes, and edema, encouraged to seek medical care for any of the above. Does endorse occasional lightheadedness, I encouraged her to  continue home BP checks BID and report to office if she is getting readings consistently <100/60 or >130/80.       Relevant Medications   atorvastatin (LIPITOR) 20 MG tablet   lisinopril (ZESTRIL) 20 MG tablet     Respiratory   Chronic obstructive pulmonary disease (HCC)    Well controlled on current regimen. Will continue Wixela and PRN Albuterol. Instructed to return to office for worsening shortness of breath, wheezing, or inability to complete activities due to dysnea.      Relevant Medications   fluticasone-salmeterol (WIXELA INHUB) 100-50 MCG/ACT AEPB   montelukast (SINGULAIR) 10 MG tablet   albuterol (VENTOLIN HFA) 108 (90 Base) MCG/ACT inhaler   Pulmonary nodule    CT scheduled in May 2024 for follow-up. Encouraged to quit smoking and offered support when she is ready.        Digestive   GERD (gastroesophageal reflux disease)   Relevant Medications   omeprazole (PRILOSEC) 40 MG capsule     Nervous and Auditory   Cigarette nicotine dependence with nicotine-induced disorder     Other   Hyperlipidemia    Well controlled on current regimen, will recheck labs in 3 months.      Relevant Medications   atorvastatin (LIPITOR) 20 MG tablet   lisinopril (ZESTRIL) 20 MG tablet   Obesity (BMI 30-39.9)   Encounter to establish care with new doctor - Primary    Today your medical history was reviewed medication refills were provided as appropriate per your medical records. Recommend incorporating exercise weekly as tolerated and consuming a well-balanced diet. Advised to stop smoking if a smoker, avoid smoking if a non-smoker, limit alcohol consumption to 1 drink per day for women and 2 drinks per day for men, and avoid illicit drug use. Counseled on safe sex practices and offered STI testing today. Counseled on the importance of sunscreen use. Counseled in mental health awareness and when to seek medical care. Vaccine maintenance discussed. Appropriate health maintenance items  reviewed. Return to office in 1 year for annual physical exam.       Other Visit Diagnoses     Need for vaccination       Relevant Orders   Varicella-zoster vaccine IM (Completed)   Encounter for screening mammogram for malignant neoplasm of breast       Relevant Orders   MM DIGITAL SCREENING BILATERAL       Return in about 3 months (around 04/25/2022) for labs and blood pressure follow up.   Rubie Maid, FNP

## 2022-01-24 NOTE — Assessment & Plan Note (Signed)
Today your medical history was reviewed medication refills were provided as appropriate per your medical records. Recommend incorporating exercise weekly as tolerated and consuming a well-balanced diet. Advised to stop smoking if a smoker, avoid smoking if a non-smoker, limit alcohol consumption to 1 drink per day for women and 2 drinks per day for men, and avoid illicit drug use. Counseled on safe sex practices and offered STI testing today. Counseled on the importance of sunscreen use. Counseled in mental health awareness and when to seek medical care. Vaccine maintenance discussed. Appropriate health maintenance items reviewed. Return to office in 1 year for annual physical exam.

## 2022-01-31 ENCOUNTER — Other Ambulatory Visit: Payer: Self-pay

## 2022-01-31 ENCOUNTER — Emergency Department (HOSPITAL_COMMUNITY): Payer: Medicaid Other

## 2022-01-31 ENCOUNTER — Ambulatory Visit: Payer: Medicaid Other | Admitting: Family Medicine

## 2022-01-31 ENCOUNTER — Emergency Department (HOSPITAL_COMMUNITY)
Admission: EM | Admit: 2022-01-31 | Discharge: 2022-01-31 | Disposition: A | Discharge status: Home / Self Care | Payer: Medicaid Other | Attending: Emergency Medicine | Admitting: Emergency Medicine

## 2022-01-31 ENCOUNTER — Encounter (HOSPITAL_COMMUNITY): Payer: Self-pay | Admitting: *Deleted

## 2022-01-31 ENCOUNTER — Encounter: Payer: Self-pay | Admitting: Family Medicine

## 2022-01-31 VITALS — BP 112/70 | HR 120 | Temp 98.2°F | Resp 24 | Ht 59.0 in | Wt 155.0 lb

## 2022-01-31 DIAGNOSIS — Z79899 Other long term (current) drug therapy: Secondary | ICD-10-CM | POA: Diagnosis not present

## 2022-01-31 DIAGNOSIS — J45909 Unspecified asthma, uncomplicated: Secondary | ICD-10-CM | POA: Insufficient documentation

## 2022-01-31 DIAGNOSIS — R051 Acute cough: Secondary | ICD-10-CM

## 2022-01-31 DIAGNOSIS — J449 Chronic obstructive pulmonary disease, unspecified: Secondary | ICD-10-CM | POA: Insufficient documentation

## 2022-01-31 DIAGNOSIS — U071 COVID-19: Secondary | ICD-10-CM | POA: Diagnosis not present

## 2022-01-31 DIAGNOSIS — Z7951 Long term (current) use of inhaled steroids: Secondary | ICD-10-CM | POA: Insufficient documentation

## 2022-01-31 DIAGNOSIS — R079 Chest pain, unspecified: Secondary | ICD-10-CM | POA: Diagnosis not present

## 2022-01-31 DIAGNOSIS — R0602 Shortness of breath: Secondary | ICD-10-CM | POA: Diagnosis present

## 2022-01-31 LAB — CBC WITH DIFFERENTIAL/PLATELET
Abs Immature Granulocytes: 0.03 10*3/uL (ref 0.00–0.07)
Basophils Absolute: 0 10*3/uL (ref 0.0–0.1)
Basophils Relative: 0 %
Eosinophils Absolute: 0 10*3/uL (ref 0.0–0.5)
Eosinophils Relative: 0 %
HCT: 35.8 % — ABNORMAL LOW (ref 36.0–46.0)
Hemoglobin: 12 g/dL (ref 12.0–15.0)
Immature Granulocytes: 1 %
Lymphocytes Relative: 13 %
Lymphs Abs: 0.8 10*3/uL (ref 0.7–4.0)
MCH: 30.5 pg (ref 26.0–34.0)
MCHC: 33.5 g/dL (ref 30.0–36.0)
MCV: 91.1 fL (ref 80.0–100.0)
Monocytes Absolute: 0.7 10*3/uL (ref 0.1–1.0)
Monocytes Relative: 12 %
Neutro Abs: 4.3 10*3/uL (ref 1.7–7.7)
Neutrophils Relative %: 74 %
Platelets: 353 10*3/uL (ref 150–400)
RBC: 3.93 MIL/uL (ref 3.87–5.11)
RDW: 15.1 % (ref 11.5–15.5)
WBC: 5.7 10*3/uL (ref 4.0–10.5)
nRBC: 0 % (ref 0.0–0.2)

## 2022-01-31 LAB — COMPREHENSIVE METABOLIC PANEL
ALT: 12 U/L (ref 0–44)
AST: 18 U/L (ref 15–41)
Albumin: 4.2 g/dL (ref 3.5–5.0)
Alkaline Phosphatase: 87 U/L (ref 38–126)
Anion gap: 12 (ref 5–15)
BUN: 12 mg/dL (ref 6–20)
CO2: 19 mmol/L — ABNORMAL LOW (ref 22–32)
Calcium: 9.2 mg/dL (ref 8.9–10.3)
Chloride: 102 mmol/L (ref 98–111)
Creatinine, Ser: 0.77 mg/dL (ref 0.44–1.00)
GFR, Estimated: >60 mL/min (ref >60)
Glucose, Bld: 97 mg/dL (ref 70–99)
Potassium: 3.6 mmol/L (ref 3.5–5.1)
Sodium: 133 mmol/L — ABNORMAL LOW (ref 135–145)
Total Bilirubin: 0.6 mg/dL (ref 0.3–1.2)
Total Protein: 8.2 g/dL — ABNORMAL HIGH (ref 6.5–8.1)

## 2022-01-31 LAB — RESP PANEL BY RT-PCR (RSV, FLU A&B, COVID)  RVPGX2
Influenza A by PCR: NEGATIVE
Influenza B by PCR: NEGATIVE
Resp Syncytial Virus by PCR: NEGATIVE
SARS Coronavirus 2 by RT PCR: POSITIVE — AB

## 2022-01-31 LAB — INFLUENZA A AND B AG, IMMUNOASSAY
INFLUENZA A ANTIGEN: NOT DETECTED
INFLUENZA B ANTIGEN: NOT DETECTED

## 2022-01-31 LAB — TROPONIN I (HIGH SENSITIVITY)
Troponin I (High Sensitivity): <2 ng/L (ref <18)
Troponin I (High Sensitivity): <2 ng/L (ref <18)

## 2022-01-31 MED ORDER — ALBUTEROL SULFATE HFA 108 (90 BASE) MCG/ACT IN AERS: 2.0000 | INHALATION_SPRAY | RESPIRATORY_TRACT | Status: DC | PRN

## 2022-01-31 MED ORDER — NIRMATRELVIR/RITONAVIR (PAXLOVID)TABLET: 3.0000 | ORAL_TABLET | Freq: Two times a day (BID) | ORAL | 0 refills | Status: AC

## 2022-01-31 MED ORDER — PROMETHAZINE-DM 6.25-15 MG/5ML PO SYRP: 2.5000 mL | ORAL_SOLUTION | Freq: Four times a day (QID) | ORAL | 0 refills | Status: DC | PRN

## 2022-01-31 MED ORDER — IPRATROPIUM-ALBUTEROL 0.5-2.5 (3) MG/3ML IN SOLN
3.0000 mL | RESPIRATORY_TRACT | Status: AC
  Administered 2022-01-31 (×3): 3 mL via RESPIRATORY_TRACT
  Filled 2022-01-31: qty 9

## 2022-01-31 NOTE — Discharge Instructions (Signed)
You tested positive for COVID today.  As this is a viral infection, no antibiotics are indicated.  I recommend that you get plenty of rest and focus on symptomatic relief which includes Cepacol throat lozenges for sore throat, Mucinex D (orange box) which you can get from behind the counter at your local pharmacy for congestion, and tylenol/ibuprofen as needed for fevers and bodyaches. I have also given you a prescription for Paxlovid which is the COVID antiviral for you to take as prescribed in its entirety for management of your symptoms.  I also given you a prescription for a Phenergan cough syrup for you to take as prescribed as needed for management of your symptoms.  Do not drive or operate heavy machinery while taking this medication as it can be sedating.  I also recommend:  Increased fluid intake. Sports drinks offer valuable electrolytes, sugars, and fluids.  Breathing heated mist or steam (vaporizer or shower).  Eating chicken soup or other clear broths, and maintaining good nutrition.   Increasing usage of your inhaler if you have asthma.  Return to work when your temperature has returned to normal.  Gargle warm salt water and spit it out for sore throat. Take benadryl or Zyrtec to decrease sinus secretions.  Follow Up: Follow up with your primary care doctor in 5-7 days for recheck of ongoing symptoms.  Return to emergency department for emergent changing or worsening of symptoms.

## 2022-01-31 NOTE — Progress Notes (Signed)
Acute Office Visit  Subjective:     Patient ID: Pamela Livingston, female    DOB: January 13, 1966, 57 y.o.   MRN: [id number]  Chief Complaint  Patient presents with   Acute Visit    hard to breath/aching all over    HPI Patient is in today for fatigue, chills, nausea, body aches, shortness of breath, left rib pain, cough, left chest pressure since last night. She reports similar symptoms when she had pneumonia previously. Denies fever, wheezing, vomiting. No known sick exposures No covid at home test Has tried Tylenol this AM  Review of Systems  All other systems reviewed and are negative.  Past Medical History:  Diagnosis Date   Asthma    Chronic pelvic pain in female    COPD (chronic obstructive pulmonary disease) ([place])    Endometriosis    Polysubstance abuse ([place])    marijuana, cocaine, benzos   Stomach ulcer    Suicide attempt ([place]) 2008   by phenergan overdose   Syncope    Past Surgical History:  Procedure Laterality Date   ABDOMINAL HYSTERECTOMY     BALLOON DILATION N/A 07/26/2020   Procedure: BALLOON DILATION;  Surgeon: Eloise Harman, DO;  [facility];  Service: Endoscopy;  Laterality: N/A;   BIOPSY  07/26/2020   Procedure: BIOPSY;  Surgeon: Eloise Harman, DO;  [facility];  Service: Endoscopy;;   BIOPSY  11/14/2021   Procedure: BIOPSY;  Surgeon: Gatha Mayer, MD;  Location: [facility] ENDOSCOPY;  Service: Gastroenterology;;   CERVICAL CONE BIOPSY     cervical cancer   CESAREAN SECTION  1993   CHOLECYSTECTOMY     COLONOSCOPY WITH PROPOFOL N/A 07/26/2020   Procedure: COLONOSCOPY WITH PROPOFOL;  Surgeon: Eloise Harman, DO;  [facility];  Service: Endoscopy;  Laterality: N/A;  12:00pm   ESOPHAGOGASTRODUODENOSCOPY (EGD) WITH PROPOFOL N/A 07/26/2020   Procedure: ESOPHAGOGASTRODUODENOSCOPY (EGD) WITH PROPOFOL;  Surgeon: Eloise Harman, DO;  [facility];  Service: Endoscopy;  Laterality: N/A;   ESOPHAGOGASTRODUODENOSCOPY  (EGD) WITH PROPOFOL N/A 11/14/2021   Procedure: ESOPHAGOGASTRODUODENOSCOPY (EGD) WITH PROPOFOL;  Surgeon: Gatha Mayer, MD;  Location: [place];  Service: Gastroenterology;  Laterality: N/A;   RADICAL HYSTERECTOMY WITH TRANSPOSITION OF OVARIES     TOOTH EXTRACTION     TUBAL LIGATION  1993   No current facility-administered medications on file prior to visit.   Current Outpatient Medications on File Prior to Visit  Medication Sig Dispense Refill   albuterol (VENTOLIN HFA) 108 (90 Base) MCG/ACT inhaler Inhale 2 puffs into the lungs every 6 (six) hours as needed for wheezing or shortness of breath. 8 g 0   atorvastatin (LIPITOR) 20 MG tablet Take 1 tablet (20 mg total) by mouth daily. 90 tablet 0   cyclobenzaprine (FLEXERIL) 10 MG tablet Take 1 tablet (10 mg total) by mouth 3 (three) times daily as needed for muscle spasms. (Patient not taking: Reported on 01/31/2022) 30 tablet 0   fluticasone-salmeterol (WIXELA INHUB) 100-50 MCG/ACT AEPB Inhale 1 puff into the lungs 2 (two) times daily. 90 each 3   gabapentin (NEURONTIN) 300 MG capsule Take 1 capsule (300 mg total) by mouth 2 (two) times daily. 60 capsule 3   lisinopril (ZESTRIL) 20 MG tablet Take 1 tablet (20 mg total) by mouth daily. 90 tablet 0   montelukast (SINGULAIR) 10 MG tablet Take 1 tablet (10 mg total) by mouth daily as needed. 90 tablet 0   nitroGLYCERIN (NITROSTAT) 0.4  MG SL tablet Place 1 tablet (0.4 mg total) under the tongue every 5 (five) minutes as needed for chest pain. 25 tablet prn   omeprazole (PRILOSEC) 40 MG capsule Take 1 capsule (40 mg total) by mouth in the morning and at bedtime. 90 capsule 3   Allergies  Allergen Reactions   Asa [Aspirin] Anaphylaxis, Hives and Swelling   Codeine Nausea Only        Objective:    BP 112/70   Pulse (!) 120   Temp 98.2 F (36.8 C)   Resp (!) 24   Ht '4\' 11"'$  (1.499 m)   Wt 155 lb (70.3 kg)   SpO2 96%   BMI 31.31 kg/m    Physical Exam Vitals and nursing note  reviewed.  Constitutional:      Appearance: She is normal weight. She is ill-appearing.  HENT:     Head: Normocephalic and atraumatic.  Cardiovascular:     Rate and Rhythm: Regular rhythm. Tachycardia present.     Pulses: Normal pulses.     Heart sounds: Normal heart sounds.  Pulmonary:     Effort: Tachypnea and accessory muscle usage present.     Breath sounds: Normal breath sounds. No wheezing, rhonchi or rales.  Skin:    General: Skin is warm and dry.     Coloration: Skin is ashen.  Neurological:     General: No focal deficit present.     Mental Status: She is alert and oriented to person, place, and time. Mental status is at baseline.     Motor: Weakness present.  Psychiatric:        Mood and Affect: Mood normal.        Behavior: Behavior normal.        Thought Content: Thought content normal.        Judgment: Judgment normal.     Results for orders placed or performed during the hospital encounter of 01/31/22  CBC with Differential  Result Value Ref Range   WBC 5.7 4.0 - 10.5 K/uL   RBC 3.93 3.87 - 5.11 MIL/uL   Hemoglobin 12.0 12.0 - 15.0 g/dL   HCT 35.8 (L) 36.0 - 46.0 %   MCV 91.1 80.0 - 100.0 fL   MCH 30.5 26.0 - 34.0 pg   MCHC 33.5 30.0 - 36.0 g/dL   RDW 15.1 11.5 - 15.5 %   Platelets 353 150 - 400 K/uL   nRBC 0.0 0.0 - 0.2 %   Neutrophils Relative % 74 %   Neutro Abs 4.3 1.7 - 7.7 K/uL   Lymphocytes Relative 13 %   Lymphs Abs 0.8 0.7 - 4.0 K/uL   Monocytes Relative 12 %   Monocytes Absolute 0.7 0.1 - 1.0 K/uL   Eosinophils Relative 0 %   Eosinophils Absolute 0.0 0.0 - 0.5 K/uL   Basophils Relative 0 %   Basophils Absolute 0.0 0.0 - 0.1 K/uL   Immature Granulocytes 1 %   Abs Immature Granulocytes 0.03 0.00 - 0.07 K/uL  Results for orders placed or performed in visit on 01/31/22  Influenza A and B Ag, Immunoassay  Result Value Ref Range   Source: NASOPHARYNX    INFLUENZA A ANTIGEN NOT DETECTED NOT DETECTED   INFLUENZA B ANTIGEN NOT DETECTED NOT  DETECTED        Assessment & Plan:   Problem List Items Addressed This Visit       Other   Chest pain - Primary    Patient presented to office with  complaint of body aches and difficulty breathing. She is ill appearing, tachypneic, and tachycardic. She endorsed chest pressure, shortness of breath, left rib pain, nausea, and body aches and was very fatigued in office. EKG showed sinus tachycardia and flu was negative in office. I am referring her immediately to the ED for further evaluation, she refused EMS transport as her partner is here with her and stated she will proceed to [facility].      Relevant Orders   EKG 12-Lead (Completed)   Other Visit Diagnoses     Acute cough       Relevant Orders   Influenza A and B Ag, Immunoassay (Completed)       No orders of the defined types were placed in this encounter.   Return if symptoms worsen or fail to improve, for after ED visit.  Rubie Maid, FNP

## 2022-01-31 NOTE — ED Notes (Signed)
Pt assisted to bsc and back to bed with minimal assistance; pt given ice chips and informed she will be going to CT

## 2022-01-31 NOTE — ED Notes (Signed)
Pt disconnected self form monitor. Went over [employer]. And wheeled out to lobby

## 2022-01-31 NOTE — Assessment & Plan Note (Signed)
Patient presented to office with complaint of body aches and difficulty breathing. She is ill appearing, tachypneic, and tachycardic. She endorsed chest pressure, shortness of breath, left rib pain, nausea, and body aches and was very fatigued in office. EKG showed sinus tachycardia and flu was negative in office. I am referring her immediately to the ED for further evaluation, she refused EMS transport as her partner is here with her and stated she will proceed to [facility].

## 2022-01-31 NOTE — Progress Notes (Deleted)
   Acute Office Visit  Subjective:     Patient ID: Pamela Livingston, female    DOB: 04-Jan-1966, 57 y.o.   MRN: [id number]  No chief complaint on file.   HPI   ROS      Objective:    There were no vitals taken for this visit. {Vitals History (Optional):23777}  Physical Exam  No results found for any visits on 01/31/22.      Assessment & Plan:   Problem List Items Addressed This Visit   None   No orders of the defined types were placed in this encounter.   No follow-ups on file.  Rubie Maid, FNP

## 2022-01-31 NOTE — ED Provider Notes (Signed)
[facility] EMERGENCY DEPARTMENT Provider Note   CSN: [id number] Arrival date & time: 01/31/22  1521     History  Chief Complaint  Patient presents with   Shortness of [place] is a 57 y.o. female.  Patient with history of asthma and COPD presents today with complaints of chest pain and shortness of breath. She states that same has been ongoing for the past day. She endorses cough and congestion as well. States that her boyfriend is sick with similar symptoms. She states that she feels like when she had pneumonia a few months ago and is concerned for same. She is coughing up green sputum. Denies fevers or chills. Does endorse associated bodyaches. Was seen at her primary care office earlier today and had flu swab that was negative and was subsequently sent here for evaluation. Patient denies nausea, vomiting, or diarrhea.   The history is provided by the patient. No language interpreter was used.  Shortness of Breath Associated symptoms: cough        Home Medications Prior to Admission medications   Medication Sig Start Date End Date Taking? Authorizing Provider  acetaminophen (TYLENOL) 325 MG tablet Take 3 tablets by mouth every 6 (six) hours as needed for moderate pain.   Yes [provider]  albuterol (VENTOLIN HFA) 108 (90 Base) MCG/ACT inhaler Inhale 2 puffs into the lungs every 6 (six) hours as needed for wheezing or shortness of breath. 01/24/22  Yes Howard, Amber S, FNP  atorvastatin (LIPITOR) 20 MG tablet Take 1 tablet (20 mg total) by mouth daily. 01/24/22  Yes Mila Merry S, FNP  diphenhydrAMINE (BENADRYL) 25 mg capsule Take 25 mg by mouth at bedtime as needed for sleep.   Yes [provider]  fluticasone-salmeterol (WIXELA INHUB) 100-50 MCG/ACT AEPB Inhale 1 puff into the lungs 2 (two) times daily. 01/24/22  Yes Rubie Maid, FNP  gabapentin (NEURONTIN) 300 MG capsule Take 1 capsule (300 mg total) by mouth 2 (two) times daily. 01/24/22  Yes  Howard, Amber S, FNP  lisinopril (ZESTRIL) 20 MG tablet Take 1 tablet (20 mg total) by mouth daily. 01/24/22  Yes Howard, Amber S, FNP  Loratadine-Pseudoephedrine (CLARITIN-D 12 HOUR PO) Take 1 tablet by mouth daily.   Yes [provider]  montelukast (SINGULAIR) 10 MG tablet Take 1 tablet (10 mg total) by mouth daily as needed. 01/24/22  Yes Rubie Maid, FNP  nitroGLYCERIN (NITROSTAT) 0.4 MG SL tablet Place 1 tablet (0.4 mg total) under the tongue every 5 (five) minutes as needed for chest pain. 10/02/18  Yes Soyla Dryer, PA-C  omeprazole (PRILOSEC) 40 MG capsule Take 1 capsule (40 mg total) by mouth in the morning and at bedtime. 01/24/22  Yes Rubie Maid, FNP  cyclobenzaprine (FLEXERIL) 10 MG tablet Take 1 tablet (10 mg total) by mouth 3 (three) times daily as needed for muscle spasms. Patient not taking: Reported on 01/31/2022 12/20/21   Soyla Dryer, PA-C      Allergies    Asa [aspirin] and Codeine    Review of Systems   Review of Systems  HENT:  Positive for congestion.   Respiratory:  Positive for cough and shortness of breath.   All other systems reviewed and are negative.   Physical Exam Updated Vital Signs BP 102/64   Pulse (!) 104   Temp 98.4 F (36.9 C) (Oral)   Resp 15   Ht '4\' 11"'$  (1.499 m)   SpO2 97%   BMI  31.31 kg/m  Physical Exam Vitals and nursing note reviewed.  Constitutional:      General: She is not in acute distress.    Appearance: Normal appearance. She is normal weight. She is not ill-appearing, toxic-appearing or diaphoretic.  HENT:     Head: Normocephalic and atraumatic.  Cardiovascular:     Rate and Rhythm: Normal rate and regular rhythm.     Heart sounds: Normal heart sounds.  Pulmonary:     Effort: Pulmonary effort is normal. No respiratory distress.     Breath sounds: Normal breath sounds.  Abdominal:     Palpations: Abdomen is soft.  Musculoskeletal:        General: Normal range of motion.     Cervical back: Normal range  of motion.     Right lower leg: No tenderness. No edema.     Left lower leg: No tenderness. No edema.  Skin:    General: Skin is warm and dry.  Neurological:     General: No focal deficit present.     Mental Status: She is alert.  Psychiatric:        Mood and Affect: Mood normal.        Behavior: Behavior normal.     ED Results / Procedures / Treatments   Labs (all labs ordered are listed, but only abnormal results are displayed) Labs Reviewed  RESP PANEL BY RT-PCR (RSV, FLU A&B, COVID)  RVPGX2 - Abnormal; Notable for the following components:      Result Value   SARS Coronavirus 2 by RT PCR POSITIVE (*)    All other components within normal limits  CBC WITH DIFFERENTIAL/PLATELET - Abnormal; Notable for the following components:   HCT 35.8 (*)    All other components within normal limits  COMPREHENSIVE METABOLIC PANEL - Abnormal; Notable for the following components:   Sodium 133 (*)    CO2 19 (*)    Total Protein 8.2 (*)    All other components within normal limits  TROPONIN I (HIGH SENSITIVITY)  TROPONIN I (HIGH SENSITIVITY)    EKG None  Radiology DG Chest Port 1 View  Result Date: 01/31/2022 CLINICAL DATA:  Shortness of breath and cough EXAM: PORTABLE CHEST 1 VIEW COMPARISON:  01/09/2022 FINDINGS: There is some linear opacity at the bases likely scar or atelectasis. Less so than the prior examination. Question tiny left effusion or opacity. New from prior. No pneumothorax or edema. Stable cardiopericardial silhouette. Overlapping cardiac leads. IMPRESSION: Improving atelectasis however more focal ill-defined opacity at the left costophrenic angle versus a tiny effusion. Recommend follow-up Electronically Signed   By: Jill Side M.D.   On: 01/31/2022 17:43    Procedures Procedures    Medications Ordered in ED Medications  albuterol (VENTOLIN HFA) 108 (90 Base) MCG/ACT inhaler 2 puff (has no administration in time range)  ipratropium-albuterol (DUONEB) 0.5-2.5 (3)  MG/3ML nebulizer solution 3 mL (3 mLs Nebulization Given 01/31/22 1626)    ED Course/ Medical Decision Making/ A&P                           Medical Decision Making Amount and/or Complexity of Data Reviewed Labs: ordered. Radiology: ordered.  Risk Prescription drug management.   This patient is a 57 y.o. female who presents to the ED for concern of chest pain, shortness of breath, cough, congestion, this involves an extensive number of treatment options, and is a complaint that carries with it a high risk of complications  and morbidity. The emergent differential diagnosis prior to evaluation includes, but is not limited to,  CHF, pericardial effusion/tamponade, arrhythmias, ACS, COPD, asthma, bronchitis, pneumonia, pneumothorax, PE, anemia   This is not an exhaustive differential.   Past Medical History / Co-morbidities / Social History: Patient recently admitted with pleural effusion in 11/20/21  Lab Tests: I ordered, and personally interpreted labs.  The pertinent results include:  COVID +. Troponin negative   Imaging Studies: I ordered imaging studies including CXR. I independently visualized and interpreted imaging which showed    Improving atelectasis however more focal ill-defined opacity at the left costophrenic angle versus a tiny effusion. Recommend follow-up  I agree with the radiologist interpretation.   Cardiac Monitoring:  The patient was maintained on a cardiac monitor which showed an underlying rhythm of: no STEMI. I agree with this interpretation.   Medications: I ordered medication including duoneb. Reevaluation of the patient after these medicines showed that the patient improved. I have reviewed the patients home medicines and have made adjustments as needed.    Disposition:  Patient presents today with complaints of chest pain and shortness of breath.  She is afebrile, nontoxic-appearing, and in no acute distress.  He is notably tachycardic, however is  not tachypneic or hypoxic.  Laboratory evaluation obtained and reveals that she has COVID.  No other concerning laboratory findings. Given her tachycardia and shortness of breath with chest pain, I did have some concern for PE and had planned for CTA for evaluation of same. However, after reevaluation patient does state she is feeling some better and would rather go home. She is able to ambulate without any hypoxia or tachypnea and her heartrate has improved. Therefore plan to discharge home with Paxlovid and close return precautions as well as close pcp follow-up. Will also send for phenergan cough syrup for symptomatic management. Patient aware this can be sedating. She is understanding and amenable with plan, educated on red flag symptoms that would prompt immediate return. Patient discharged in stable condition.   Final Clinical Impression(s) / ED Diagnoses Final diagnoses:  COVID    Rx / DC Orders ED Discharge Orders          Ordered    nirmatrelvir/ritonavir (PAXLOVID) 20 x 150 MG & 10 x '100MG'$  TABS  2 times daily        01/31/22 1928    promethazine-dextromethorphan (PROMETHAZINE-DM) 6.25-15 MG/5ML syrup  4 times daily PRN        01/31/22 1928          An After Visit Summary was printed and given to the patient.     Nestor Lewandowsky 01/31/22 Deeann Saint, MD 02/01/22 1453

## 2022-01-31 NOTE — ED Triage Notes (Signed)
Pt with SOB starting today, + cough, unknown of fevers.  Pt states PCP sent pt over from office today.

## 2022-02-01 NOTE — Addendum Note (Signed)
Addended by: Rubie Maid on: 02/01/2022 04:49 PM   Modules accepted: Level of Service

## 2022-02-05 ENCOUNTER — Ambulatory Visit (HOSPITAL_COMMUNITY): Payer: Medicaid Other

## 2022-02-27 ENCOUNTER — Ambulatory Visit (HOSPITAL_COMMUNITY)
Admission: RE | Admit: 2022-02-27 | Discharge: 2022-02-27 | Disposition: A | Discharge status: Home / Self Care | Payer: Medicaid Other | Source: Ambulatory Visit | Attending: Pulmonary Disease | Admitting: Pulmonary Disease

## 2022-02-27 DIAGNOSIS — J449 Chronic obstructive pulmonary disease, unspecified: Secondary | ICD-10-CM | POA: Diagnosis not present

## 2022-02-27 LAB — PULMONARY FUNCTION TEST
DL/VA % pred: 69 %
DL/VA: 3.1 ml/min/mmHg/L
DLCO cor % pred: 63 %
DLCO cor: 11.02 ml/min/mmHg
DLCO unc % pred: 60 %
DLCO unc: 10.51 ml/min/mmHg
FEF 25-75 Post: 2.12 L/sec
FEF 25-75 Pre: 1.8 L/sec
FEF2575-%Change-Post: 17 %
FEF2575-%Pred-Post: 91 %
FEF2575-%Pred-Pre: 77 %
FEV1-%Change-Post: 6 %
FEV1-%Pred-Post: 95 %
FEV1-%Pred-Pre: 89 %
FEV1-Post: 2.15 L
FEV1-Pre: 2.02 L
FEV1FVC-%Change-Post: 1 %
FEV1FVC-%Pred-Pre: 96 %
FEV6-%Change-Post: 3 %
FEV6-%Pred-Post: 98 %
FEV6-%Pred-Pre: 94 %
FEV6-Post: 2.74 L
FEV6-Pre: 2.63 L
FEV6FVC-%Change-Post: 0 %
FEV6FVC-%Pred-Post: 102 %
FEV6FVC-%Pred-Pre: 103 %
FVC-%Change-Post: 4 %
FVC-%Pred-Post: 96 %
FVC-%Pred-Pre: 91 %
FVC-Post: 2.75 L
FVC-Pre: 2.63 L
Post FEV1/FVC ratio: 78 %
Post FEV6/FVC ratio: 99 %
Pre FEV1/FVC ratio: 77 %
Pre FEV6/FVC Ratio: 100 %

## 2022-02-27 MED ORDER — ALBUTEROL SULFATE (2.5 MG/3ML) 0.083% IN NEBU
2.5000 mg | INHALATION_SOLUTION | Freq: Once | RESPIRATORY_TRACT | Status: AC
  Administered 2022-02-27: 2.5 mg via RESPIRATORY_TRACT

## 2022-03-13 ENCOUNTER — Ambulatory Visit: Payer: Medicaid Other | Admitting: Family Medicine

## 2022-03-15 ENCOUNTER — Encounter: Payer: Self-pay | Admitting: Family Medicine

## 2022-03-15 ENCOUNTER — Ambulatory Visit: Payer: Medicaid Other | Admitting: Family Medicine

## 2022-03-15 VITALS — BP 122/84 | HR 88 | Temp 97.4°F | Ht 59.0 in | Wt 158.0 lb

## 2022-03-15 DIAGNOSIS — I10 Essential (primary) hypertension: Secondary | ICD-10-CM | POA: Diagnosis not present

## 2022-03-15 DIAGNOSIS — M545 Low back pain, unspecified: Secondary | ICD-10-CM | POA: Diagnosis not present

## 2022-03-15 DIAGNOSIS — G8929 Other chronic pain: Secondary | ICD-10-CM | POA: Diagnosis not present

## 2022-03-15 DIAGNOSIS — R600 Localized edema: Secondary | ICD-10-CM | POA: Diagnosis not present

## 2022-03-15 MED ORDER — CYCLOBENZAPRINE HCL 10 MG PO TABS: 10.0000 mg | ORAL_TABLET | Freq: Three times a day (TID) | ORAL | 0 refills | Status: DC | PRN

## 2022-03-15 NOTE — Progress Notes (Signed)
Acute Office Visit  Subjective:     Patient ID: Pamela Livingston, female    DOB: 1965-11-08, 57 y.o.   MRN: [id number]  Chief Complaint  Patient presents with   Follow-up    b/p elevated and both feet swollen (sx since Friday 2/16) - JBG\\\     HPI Patient is in today for elevated BP and swelling in both feet, legs, and hands. The swelling is improved in the AM and worse by the end of the day. BP elevated to 157/85 Friday to Monday, she is checking daily, it has been improved since Tuesday. She reports she has been consuming only liquids due to dental surgeries for 2 weeks, including broths, jello, pudding, bananas. No medication changes. Denies chest pain, worsening dyspnea, headaches, vision changes, palpitations, lightheadedness/dizziness, redness/warmth/pain to legs.  Review of Systems  All other systems reviewed and are negative.   Past Medical History:  Diagnosis Date   Asthma    Chronic pelvic pain in female    COPD (chronic obstructive pulmonary disease) ([place])    Endometriosis    Polysubstance abuse ([place])    marijuana, cocaine, benzos   Stomach ulcer    Suicide attempt ([place]) 2008   by phenergan overdose   Syncope    Past Surgical History:  Procedure Laterality Date   ABDOMINAL HYSTERECTOMY     BALLOON DILATION N/A 07/26/2020   Procedure: BALLOON DILATION;  Surgeon: Eloise Harman, DO;  [facility];  Service: Endoscopy;  Laterality: N/A;   BIOPSY  07/26/2020   Procedure: BIOPSY;  Surgeon: Eloise Harman, DO;  [facility];  Service: Endoscopy;;   BIOPSY  11/14/2021   Procedure: BIOPSY;  Surgeon: Gatha Mayer, MD;  Location: [facility] ENDOSCOPY;  Service: Gastroenterology;;   CERVICAL CONE BIOPSY     cervical cancer   CESAREAN SECTION  1993   CHOLECYSTECTOMY     COLONOSCOPY WITH PROPOFOL N/A 07/26/2020   Procedure: COLONOSCOPY WITH PROPOFOL;  Surgeon: Eloise Harman, DO;  [facility];  Service: Endoscopy;  Laterality: N/A;  12:00pm    ESOPHAGOGASTRODUODENOSCOPY (EGD) WITH PROPOFOL N/A 07/26/2020   Procedure: ESOPHAGOGASTRODUODENOSCOPY (EGD) WITH PROPOFOL;  Surgeon: Eloise Harman, DO;  [facility];  Service: Endoscopy;  Laterality: N/A;   ESOPHAGOGASTRODUODENOSCOPY (EGD) WITH PROPOFOL N/A 11/14/2021   Procedure: ESOPHAGOGASTRODUODENOSCOPY (EGD) WITH PROPOFOL;  Surgeon: Gatha Mayer, MD;  Location: [place];  Service: Gastroenterology;  Laterality: N/A;   RADICAL HYSTERECTOMY WITH TRANSPOSITION OF OVARIES     TOOTH EXTRACTION     TUBAL LIGATION  1993   Current Outpatient Medications on File Prior to Visit  Medication Sig Dispense Refill   acetaminophen (TYLENOL) 325 MG tablet Take 3 tablets by mouth every 6 (six) hours as needed for moderate pain.     albuterol (VENTOLIN HFA) 108 (90 Base) MCG/ACT inhaler Inhale 2 puffs into the lungs every 6 (six) hours as needed for wheezing or shortness of breath. 8 g 0   atorvastatin (LIPITOR) 20 MG tablet Take 1 tablet (20 mg total) by mouth daily. 90 tablet 0   diphenhydrAMINE (BENADRYL) 25 mg capsule Take 25 mg by mouth at bedtime as needed for sleep.     fluticasone-salmeterol (WIXELA INHUB) 100-50 MCG/ACT AEPB Inhale 1 puff into the lungs 2 (two) times daily. 90 each 3   gabapentin (NEURONTIN) 300 MG capsule Take 1 capsule (300 mg total) by mouth 2 (two) times daily. 60 capsule 3   lisinopril (ZESTRIL) 20  MG tablet Take 1 tablet (20 mg total) by mouth daily. 90 tablet 0   Loratadine-Pseudoephedrine (CLARITIN-D 12 HOUR PO) Take 1 tablet by mouth daily.     montelukast (SINGULAIR) 10 MG tablet Take 1 tablet (10 mg total) by mouth daily as needed. 90 tablet 0   nitroGLYCERIN (NITROSTAT) 0.4 MG SL tablet Place 1 tablet (0.4 mg total) under the tongue every 5 (five) minutes as needed for chest pain. 25 tablet prn   omeprazole (PRILOSEC) 40 MG capsule Take 1 capsule (40 mg total) by mouth in the morning and at bedtime. 90 capsule 3   No current facility-administered  medications on file prior to visit.   Allergies  Allergen Reactions   Asa [Aspirin] Anaphylaxis, Hives and Swelling   Codeine Nausea Only        Objective:    BP 122/84   Pulse 88   Temp (!) 97.4 F (36.3 C) (Oral)   Ht 4' 11"$  (1.499 m)   Wt 158 lb (71.7 kg)   SpO2 99%   BMI 31.91 kg/m  BP Readings from Last 3 Encounters:  03/15/22 122/84  01/31/22 102/64  01/31/22 112/70      Physical Exam Vitals and nursing note reviewed.  Constitutional:      Appearance: Normal appearance. She is normal weight.  HENT:     Head: Normocephalic and atraumatic.  Cardiovascular:     Rate and Rhythm: Normal rate and regular rhythm.     Pulses: Normal pulses.     Heart sounds: Normal heart sounds.  Pulmonary:     Effort: Pulmonary effort is normal.     Breath sounds: Normal breath sounds.  Musculoskeletal:     Right lower leg: No edema.     Left lower leg: No edema.  Skin:    General: Skin is warm and dry.  Neurological:     General: No focal deficit present.     Mental Status: She is alert and oriented to person, place, and time. Mental status is at baseline.  Psychiatric:        Mood and Affect: Mood normal.        Behavior: Behavior normal.        Thought Content: Thought content normal.        Judgment: Judgment normal.     No results found for any visits on 03/15/22.      Assessment & Plan:   Problem List Items Addressed This Visit       Cardiovascular and Mediastinum   Hypertension - Primary    Blood pressure is back to baseline today and no edema noted on exam. Will continue Lisinopril 70m daily and home BP checks. CMP and BNP drawn today. Encouraged her to use compression stockings throughout the day. Notify office if BP sustains >140/90, seek medical care for BP>180/110, chest pain, shortness of breath, palpitations, lightheadedness, dizziness, worsening or persistent edema. Follow up at next office visit in 4-6 weeks.      Relevant Orders   COMPLETE  METABOLIC PANEL WITH GFR   Brain natriuretic peptide   Other Visit Diagnoses     Bilateral lower extremity edema       Relevant Orders   COMPLETE METABOLIC PANEL WITH GFR   Brain natriuretic peptide   Chronic bilateral low back pain without sciatica       Relevant Medications   cyclobenzaprine (FLEXERIL) 10 MG tablet       Meds ordered this encounter  Medications   cyclobenzaprine (FLEXERIL)  10 MG tablet    Sig: Take 1 tablet (10 mg total) by mouth 3 (three) times daily as needed for muscle spasms.    Dispense:  30 tablet    Refill:  0    Order Specific Question:   Supervising Provider    Answer:   Jenna Luo T [id number]    Return if symptoms worsen or fail to improve.  Rubie Maid, FNP

## 2022-03-15 NOTE — Assessment & Plan Note (Addendum)
Blood pressure is back to baseline today and no edema noted on exam. Will continue Lisinopril 45m daily and home BP checks. CMP and BNP drawn today. Encouraged her to use compression stockings throughout the day. Notify office if BP sustains >140/90, seek medical care for BP>180/110, chest pain, shortness of breath, palpitations, lightheadedness, dizziness, worsening or persistent edema. Follow up at next office visit in 4-6 weeks.

## 2022-03-16 LAB — COMPLETE METABOLIC PANEL WITH GFR
AG Ratio: 1.5 (calc) (ref 1.0–2.5)
ALT: 5 U/L — ABNORMAL LOW (ref 6–29)
AST: 10 U/L (ref 10–35)
Albumin: 4 g/dL (ref 3.6–5.1)
Alkaline phosphatase (APISO): 88 U/L (ref 37–153)
BUN: 11 mg/dL (ref 7–25)
CO2: 23 mmol/L (ref 20–32)
Calcium: 9.3 mg/dL (ref 8.6–10.4)
Chloride: 110 mmol/L (ref 98–110)
Creat: 0.59 mg/dL (ref 0.50–1.03)
Globulin: 2.6 g/dL (calc) (ref 1.9–3.7)
Glucose, Bld: 97 mg/dL (ref 65–99)
Potassium: 3.5 mmol/L (ref 3.5–5.3)
Sodium: 143 mmol/L (ref 135–146)
Total Bilirubin: 0.1 mg/dL — ABNORMAL LOW (ref 0.2–1.2)
Total Protein: 6.6 g/dL (ref 6.1–8.1)
eGFR: 107 mL/min/{1.73_m2} (ref >=60)

## 2022-03-16 LAB — BRAIN NATRIURETIC PEPTIDE: Brain Natriuretic Peptide: 25 pg/mL (ref <100)

## 2022-03-19 ENCOUNTER — Ambulatory Visit: Payer: Medicaid Other | Admitting: Family Medicine

## 2022-03-19 ENCOUNTER — Encounter: Payer: Self-pay | Admitting: Family Medicine

## 2022-03-19 VITALS — BP 130/90 | HR 95 | Temp 97.5°F | Ht 59.0 in | Wt 149.0 lb

## 2022-03-19 DIAGNOSIS — J42 Unspecified chronic bronchitis: Secondary | ICD-10-CM | POA: Diagnosis not present

## 2022-03-19 DIAGNOSIS — J209 Acute bronchitis, unspecified: Secondary | ICD-10-CM

## 2022-03-19 MED ORDER — PREDNISONE 50 MG PO TABS: ORAL_TABLET | ORAL | 0 refills | Status: DC

## 2022-03-19 MED ORDER — AMOXICILLIN-POT CLAVULANATE 875-125 MG PO TABS: 1.0000 | ORAL_TABLET | Freq: Two times a day (BID) | ORAL | 0 refills | Status: DC

## 2022-03-19 NOTE — Assessment & Plan Note (Addendum)
Patient experiencing acute worsening of shortness of breath, sputum purulence, and sputum volume for 5 days. She does not appear to be in respiratory distress. Has been able to control her SOB and wheezing with q4h PRN inhalers. Will start Prednisone '50mg'$  daily for 7days and Augmentin '875mg'$  BID for 5 days. Instructed to seek medical care if symptoms persist or worsen.

## 2022-03-19 NOTE — Progress Notes (Signed)
Acute Office Visit  Subjective:     Patient ID: Pamela Livingston, female    DOB: Sep 10, 1965, 57 y.o.   MRN: [id number]  Chief Complaint  Patient presents with   Follow-up    head cold; settling in chest. Sx: congestion, coughing a lot. Mucous greenish. Neg COVID test night of 03/18/22 - JBG\\\    HPI Patient is in today for 4 days of shortness of breath, mucopurulent cough, nasal and chest congestion, malaise, and wheezing. Denies fever, body aches, chills. She was blowing leaves last week.  Has tried claritin d, tylenol sinus, mucinex, albuterol q4h and nebulizer q4h without improvement.  Review of Systems  All other systems reviewed and are negative.  Past Medical History:  Diagnosis Date   Asthma    Chronic pelvic pain in female    COPD (chronic obstructive pulmonary disease) ([place])    Endometriosis    Polysubstance abuse ([place])    marijuana, cocaine, benzos   Stomach ulcer    Suicide attempt ([place]) 2008   by phenergan overdose   Syncope    Past Surgical History:  Procedure Laterality Date   ABDOMINAL HYSTERECTOMY     BALLOON DILATION N/A 07/26/2020   Procedure: BALLOON DILATION;  Surgeon: Eloise Harman, DO;  [facility];  Service: Endoscopy;  Laterality: N/A;   BIOPSY  07/26/2020   Procedure: BIOPSY;  Surgeon: Eloise Harman, DO;  [facility];  Service: Endoscopy;;   BIOPSY  11/14/2021   Procedure: BIOPSY;  Surgeon: Gatha Mayer, MD;  Location[facility] ENDOSCOPY;  Service: Gastroenterology;;   CERVICAL CONE BIOPSY     cervical cancer   CESAREAN SECTION  1993   CHOLECYSTECTOMY     COLONOSCOPY WITH PROPOFOL N/A 07/26/2020   Procedure: COLONOSCOPY WITH PROPOFOL;  Surgeon: Eloise Harman, DO;  [facility];  Service: Endoscopy;  Laterality: N/A;  12:00pm   ESOPHAGOGASTRODUODENOSCOPY (EGD) WITH PROPOFOL N/A 07/26/2020   Procedure: ESOPHAGOGASTRODUODENOSCOPY (EGD) WITH PROPOFOL;  Surgeon: Eloise Harman, DO;  [facility];   Service: Endoscopy;  Laterality: N/A;   ESOPHAGOGASTRODUODENOSCOPY (EGD) WITH PROPOFOL N/A 11/14/2021   Procedure: ESOPHAGOGASTRODUODENOSCOPY (EGD) WITH PROPOFOL;  Surgeon: Gatha Mayer, MD;  Location: [place];  Service: Gastroenterology;  Laterality: N/A;   RADICAL HYSTERECTOMY WITH TRANSPOSITION OF OVARIES     TOOTH EXTRACTION     TUBAL LIGATION  1993   Current Outpatient Medications on File Prior to Visit  Medication Sig Dispense Refill   acetaminophen (TYLENOL) 325 MG tablet Take 3 tablets by mouth every 6 (six) hours as needed for moderate pain.     albuterol (VENTOLIN HFA) 108 (90 Base) MCG/ACT inhaler Inhale 2 puffs into the lungs every 6 (six) hours as needed for wheezing or shortness of breath. 8 g 0   atorvastatin (LIPITOR) 20 MG tablet Take 1 tablet (20 mg total) by mouth daily. 90 tablet 0   cyclobenzaprine (FLEXERIL) 10 MG tablet Take 1 tablet (10 mg total) by mouth 3 (three) times daily as needed for muscle spasms. 30 tablet 0   diphenhydrAMINE (BENADRYL) 25 mg capsule Take 25 mg by mouth at bedtime as needed for sleep.     fluticasone-salmeterol (WIXELA INHUB) 100-50 MCG/ACT AEPB Inhale 1 puff into the lungs 2 (two) times daily. 90 each 3   gabapentin (NEURONTIN) 300 MG capsule Take 1 capsule (300 mg total) by mouth 2 (two) times daily. 60 capsule 3   lisinopril (ZESTRIL) 20 MG tablet Take 1  tablet (20 mg total) by mouth daily. 90 tablet 0   Loratadine-Pseudoephedrine (CLARITIN-D 12 HOUR PO) Take 1 tablet by mouth daily.     montelukast (SINGULAIR) 10 MG tablet Take 1 tablet (10 mg total) by mouth daily as needed. 90 tablet 0   nitroGLYCERIN (NITROSTAT) 0.4 MG SL tablet Place 1 tablet (0.4 mg total) under the tongue every 5 (five) minutes as needed for chest pain. 25 tablet prn   omeprazole (PRILOSEC) 40 MG capsule Take 1 capsule (40 mg total) by mouth in the morning and at bedtime. 90 capsule 3   No current facility-administered medications on file prior to visit.    Allergies  Allergen Reactions   Asa [Aspirin] Anaphylaxis, Hives and Swelling   Codeine Nausea Only        Objective:    BP (!) 130/90   Pulse 95   Temp (!) 97.5 F (36.4 C) (Oral)   Ht '4\' 11"'$  (1.499 m)   Wt 149 lb (67.6 kg)   SpO2 98%   BMI 30.09 kg/m    Physical Exam Vitals and nursing note reviewed.  Constitutional:      Appearance: Normal appearance. She is normal weight.  HENT:     Head: Normocephalic and atraumatic.  Cardiovascular:     Rate and Rhythm: Normal rate and regular rhythm.     Pulses: Normal pulses.     Heart sounds: Normal heart sounds.  Pulmonary:     Effort: Pulmonary effort is normal.     Breath sounds: Wheezing and rhonchi present.  Skin:    General: Skin is warm and dry.  Neurological:     General: No focal deficit present.     Mental Status: She is alert and oriented to person, place, and time. Mental status is at baseline.  Psychiatric:        Mood and Affect: Mood normal.        Behavior: Behavior normal.        Thought Content: Thought content normal.        Judgment: Judgment normal.     No results found for any visits on 03/19/22.      Assessment & Plan:   Problem List Items Addressed This Visit       Respiratory   Acute exacerbation of chronic bronchitis ([place]) - Primary    Patient experiencing acute worsening of shortness of breath, sputum purulence, and sputum volume for 5 days. She does not appear to be in respiratory distress. Has been able to control her SOB and wheezing with q4h PRN inhalers. Will start Prednisone '50mg'$  daily for 7days and Augmentin '875mg'$  BID for 5 days. Instructed to seek medical care if symptoms persist or worsen.       No orders of the defined types were placed in this encounter.   Return if symptoms worsen or fail to improve.  Rubie Maid, FNP

## 2022-04-25 ENCOUNTER — Ambulatory Visit: Payer: Medicaid Other | Admitting: Family Medicine

## 2022-04-25 VITALS — BP 130/88 | HR 73 | Temp 97.4°F | Ht 59.0 in | Wt 152.4 lb

## 2022-04-25 DIAGNOSIS — I10 Essential (primary) hypertension: Secondary | ICD-10-CM

## 2022-04-25 DIAGNOSIS — Z23 Encounter for immunization: Secondary | ICD-10-CM | POA: Diagnosis not present

## 2022-04-25 MED ORDER — ADVAIR DISKUS 250-50 MCG/ACT IN AEPB: 1.0000 | INHALATION_SPRAY | Freq: Two times a day (BID) | RESPIRATORY_TRACT | 0 refills | Status: DC

## 2022-04-25 MED ORDER — GABAPENTIN 300 MG PO CAPS: 300.0000 mg | ORAL_CAPSULE | Freq: Two times a day (BID) | ORAL | 3 refills | Status: DC

## 2022-04-25 MED ORDER — HYDROCHLOROTHIAZIDE 12.5 MG PO TABS: 12.5000 mg | ORAL_TABLET | Freq: Every day | ORAL | 3 refills | Status: DC

## 2022-04-25 MED ORDER — CYCLOBENZAPRINE HCL 10 MG PO TABS: 10.0000 mg | ORAL_TABLET | Freq: Three times a day (TID) | ORAL | 0 refills | Status: DC | PRN

## 2022-04-25 NOTE — Progress Notes (Unsigned)
Acute Office Visit  Subjective:     Patient ID: Pamela Livingston, female    DOB: Sep 17, 1965, 57 y.o.   MRN: [id number]  Chief Complaint  Patient presents with   Hypertension    Follow up patient states she is having swelling in both ankles.    Hypertension   Patient is in today for hypertension follow-up. She does endorse lower extremity edema today, this has been going on intermittently for years. It usually lasts a few days and self resolves. Her BP is stable at home without spikes and she checks daily. She has been monitoring her sodium intake.   HYPERTENSION without Chronic Kidney Disease Hypertension status: stable  Satisfied with current treatment? yes Duration of hypertension: chronic BP monitoring frequency:  daily BP range: BP medication side effects:  no Medication compliance: excellent compliance Previous BP meds:lisinopril Aspirin: no Recurrent headaches: no Visual changes: no Palpitations: no Dyspnea: no Chest pain: no Lower extremity edema: yes Dizzy/lightheaded: no   Review of Systems  All other systems reviewed and are negative.   Past Medical History:  Diagnosis Date   Asthma    Chronic pelvic pain in female    COPD (chronic obstructive pulmonary disease) ([place])    Endometriosis    Polysubstance abuse ([place])    marijuana, cocaine, benzos   Stomach ulcer    Suicide attempt ([place]) 2008   by phenergan overdose   Syncope    Past Surgical History:  Procedure Laterality Date   ABDOMINAL HYSTERECTOMY     BALLOON DILATION N/A 07/26/2020   Procedure: BALLOON DILATION;  Surgeon: Eloise Harman, DO;  [facility];  Service: Endoscopy;  Laterality: N/A;   BIOPSY  07/26/2020   Procedure: BIOPSY;  Surgeon: Eloise Harman, DO;  [facility];  Service: Endoscopy;;   BIOPSY  11/14/2021   Procedure: BIOPSY;  Surgeon: Gatha Mayer, MD;  Location: [facility];  Service: Gastroenterology;;   CERVICAL CONE BIOPSY     cervical cancer    CESAREAN SECTION  1993   CHOLECYSTECTOMY     COLONOSCOPY WITH PROPOFOL N/A 07/26/2020   Procedure: COLONOSCOPY WITH PROPOFOL;  Surgeon: Eloise Harman, DO;  [facility];  Service: Endoscopy;  Laterality: N/A;  12:00pm   ESOPHAGOGASTRODUODENOSCOPY (EGD) WITH PROPOFOL N/A 07/26/2020   Procedure: ESOPHAGOGASTRODUODENOSCOPY (EGD) WITH PROPOFOL;  Surgeon: Eloise Harman, DO;  [facility];  Service: Endoscopy;  Laterality: N/A;   ESOPHAGOGASTRODUODENOSCOPY (EGD) WITH PROPOFOL N/A 11/14/2021   Procedure: ESOPHAGOGASTRODUODENOSCOPY (EGD) WITH PROPOFOL;  Surgeon: Gatha Mayer, MD;  Location: [facility];  Service: Gastroenterology;  Laterality: N/A;   RADICAL HYSTERECTOMY WITH TRANSPOSITION OF OVARIES     TOOTH EXTRACTION     TUBAL LIGATION  1993   Current Outpatient Medications on File Prior to Visit  Medication Sig Dispense Refill   acetaminophen (TYLENOL) 325 MG tablet Take 3 tablets by mouth every 6 (six) hours as needed for moderate pain.     albuterol (VENTOLIN HFA) 108 (90 Base) MCG/ACT inhaler Inhale 2 puffs into the lungs every 6 (six) hours as needed for wheezing or shortness of breath. 8 g 0   atorvastatin (LIPITOR) 20 MG tablet Take 1 tablet (20 mg total) by mouth daily. 90 tablet 0   diphenhydrAMINE (BENADRYL) 25 mg capsule Take 25 mg by mouth at bedtime as needed for sleep.     lisinopril (ZESTRIL) 20 MG tablet Take 1 tablet (20 mg total) by mouth daily. 90 tablet 0  Loratadine-Pseudoephedrine (CLARITIN-D 12 HOUR PO) Take 1 tablet by mouth daily.     montelukast (SINGULAIR) 10 MG tablet Take 1 tablet (10 mg total) by mouth daily as needed. 90 tablet 0   nitroGLYCERIN (NITROSTAT) 0.4 MG SL tablet Place 1 tablet (0.4 mg total) under the tongue every 5 (five) minutes as needed for chest pain. 25 tablet prn   omeprazole (PRILOSEC) 40 MG capsule Take 1 capsule (40 mg total) by mouth in the morning and at bedtime. 90 capsule 3   No current facility-administered  medications on file prior to visit.   Allergies  Allergen Reactions   Asa [Aspirin] Anaphylaxis, Hives and Swelling   Codeine Nausea Only       Objective:    BP 130/88   Pulse 73   Temp (!) 97.4 F (36.3 C) (Oral)   Ht 4\' 11"  (1.499 m)   Wt 152 lb 6.4 oz (69.1 kg)   SpO2 98%   BMI 30.78 kg/m  BP Readings from Last 3 Encounters:  04/25/22 130/88  03/19/22 (!) 130/90  03/15/22 122/84      Physical Exam Vitals and nursing note reviewed.  Constitutional:      Appearance: Normal appearance. She is normal weight.  HENT:     Head: Normocephalic and atraumatic.  Cardiovascular:     Rate and Rhythm: Normal rate and regular rhythm.     Pulses: Normal pulses.     Heart sounds: Normal heart sounds.  Pulmonary:     Effort: Pulmonary effort is normal.     Breath sounds: Normal breath sounds.  Musculoskeletal:     Right ankle: Swelling present.     Left ankle: Swelling present.  Skin:    General: Skin is warm and dry.  Neurological:     General: No focal deficit present.     Mental Status: She is alert and oriented to person, place, and time. Mental status is at baseline.  Psychiatric:        Mood and Affect: Mood normal.        Behavior: Behavior normal.        Thought Content: Thought content normal.        Judgment: Judgment normal.     No results found for any visits on 04/25/22.      Assessment & Plan:   Problem List Items Addressed This Visit       Cardiovascular and Mediastinum   Hypertension    BP remains elevated >130/80. She does not have home BP readings but states it stays in this range. She also endorses intermittent lower extremity edema. Will start HCTZ 12.5mg  daily and continue Lisinopril 20mg  daily. Continue to monitor BP at home daily and report to office if sustains >140/90 or for readings <100/60. Seek medical care for BP >180/110, chest pain, shortness of breath, palpitations, dizziness, worsening or persist edema. Follow up in 2 weeks for BP  check, CMP, and repeat lipids.      Relevant Medications   hydrochlorothiazide (HYDRODIURIL) 12.5 MG tablet     Other   Need for vaccination - Primary   Relevant Orders   Zoster Recombinant (Shingrix ) (Completed)    Meds ordered this encounter  Medications   gabapentin (NEURONTIN) 300 MG capsule    Sig: Take 1 capsule (300 mg total) by mouth 2 (two) times daily.    Dispense:  60 capsule    Refill:  3   cyclobenzaprine (FLEXERIL) 10 MG tablet    Sig: Take 1 tablet (  10 mg total) by mouth 3 (three) times daily as needed for muscle spasms.    Dispense:  30 tablet    Refill:  0   fluticasone-salmeterol (ADVAIR DISKUS) 250-50 MCG/ACT AEPB    Sig: Inhale 1 puff into the lungs in the morning and at bedtime.    Dispense:  360 each    Refill:  0    Order Specific Question:   Supervising Provider    Answer:   Jenna Luo T [[id number]]   hydrochlorothiazide (HYDRODIURIL) 12.5 MG tablet    Sig: Take 1 tablet (12.5 mg total) by mouth daily.    Dispense:  90 tablet    Refill:  3    Order Specific Question:   Supervising Provider    Answer:   Jenna Luo T [[id number]]    Return in about 2 weeks (around 05/09/2022) for labs and BP check.  Rubie Maid, FNP

## 2022-04-26 NOTE — Assessment & Plan Note (Signed)
BP remains elevated >130/80. She does not have home BP readings but states it stays in this range. She also endorses intermittent lower extremity edema. Will start HCTZ 12.5mg  daily and continue Lisinopril 20mg  daily. Continue to monitor BP at home daily and report to office if sustains >140/90 or for readings <100/60. Seek medical care for BP >180/110, chest pain, shortness of breath, palpitations, dizziness, worsening or persist edema. Follow up in 2 weeks for BP check, CMP, and repeat lipids.

## 2022-04-27 ENCOUNTER — Other Ambulatory Visit: Payer: Self-pay | Admitting: Family Medicine

## 2022-05-09 ENCOUNTER — Encounter: Payer: Self-pay | Admitting: Family Medicine

## 2022-05-09 ENCOUNTER — Ambulatory Visit: Payer: Medicaid Other | Admitting: Family Medicine

## 2022-05-09 VITALS — BP 100/72 | HR 77 | Temp 97.6°F | Ht 59.0 in | Wt 149.8 lb

## 2022-05-09 DIAGNOSIS — I10 Essential (primary) hypertension: Secondary | ICD-10-CM

## 2022-05-09 NOTE — Assessment & Plan Note (Signed)
BP at goal <130/80, checking daily at home. Improved lower extremity edema. Continue Lisinopril  daily. Continue to monitor BP at home daily and report to office if sustains >140/90 or for readings <100/60. Seek medical care for BP >180/110, chest pain, shortness of breath, palpitations, dizziness, worsening or persist edema. Follow up in 3 months.

## 2022-05-09 NOTE — Progress Notes (Signed)
Acute Office Visit  Subjective:     Patient ID: Pamela Livingston, female    DOB: April 28, 1965, 57 y.o.   MRN: [id number]  Chief Complaint  Patient presents with   Follow-up    2 weeks (around 05/09/2022) for labs and BP check - JBG\\\      HPI Patient is in today for follow-up for blood pressure. She was started on HCTZ 12.5mg  daily in addition to her  daily Lisinopril 2 weeks ago. She took the HCTZ for 3 days and discontinued because her blood pressure dropped to 70s/50. She has continued Lisinopril  daily. She has made dietary improvements and reduced her sodium intake. Still endorses some mild ankle swelling. She is also quitting smoking.  HYPERTENSION without Chronic Kidney Disease Hypertension status: better  Satisfied with current treatment? yes Duration of hypertension: chronic BP monitoring frequency:  daily BP range: 130s/70s BP medication side effects:  no Medication compliance: excellent compliance Previous BP meds:HCTZ and lisinopril Aspirin: no Recurrent headaches: no Visual changes: no Palpitations: no Dyspnea: no Chest pain: no Lower extremity edema: yes Dizzy/lightheaded: no   Review of Systems  All other systems reviewed and are negative.       Objective:    BP 100/72   Pulse 77   Temp 97.6 F (36.4 C) (Oral)   Ht  (1.499 m)   Wt 149 lb 12.8 oz (67.9 kg)   SpO2 94%   BMI 30.26 kg/m  BP Readings from Last 3 Encounters:  05/09/22 100/72  04/25/22 130/88  03/19/22 (!) 130/90      Physical Exam Vitals and nursing note reviewed.  Constitutional:      Appearance: Normal appearance. She is normal weight.  HENT:     Head: Normocephalic and atraumatic.  Cardiovascular:     Rate and Rhythm: Normal rate and regular rhythm.     Pulses: Normal pulses.     Heart sounds: Normal heart sounds.  Pulmonary:     Effort: Pulmonary effort is normal.     Breath sounds: Normal breath sounds.  Musculoskeletal:     Left ankle: Swelling  present.  Skin:    General: Skin is warm and dry.  Neurological:     General: No focal deficit present.     Mental Status: She is alert and oriented to person, place, and time. Mental status is at baseline.  Psychiatric:        Mood and Affect: Mood normal.        Behavior: Behavior normal.        Thought Content: Thought content normal.        Judgment: Judgment normal.     No results found for any visits on 05/09/22.      Assessment & Plan:   Problem List Items Addressed This Visit       Cardiovascular and Mediastinum   Hypertension - Primary    BP at goal <130/80, checking daily at home. Improved lower extremity edema. Continue Lisinopril  daily. Continue to monitor BP at home daily and report to office if sustains >140/90 or for readings <100/60. Seek medical care for BP >180/110, chest pain, shortness of breath, palpitations, dizziness, worsening or persist edema. Follow up in 3 months.      Relevant Orders   COMPLETE METABOLIC PANEL WITH GFR   Lipid panel    No orders of the defined types were placed in this encounter.   Return in about 3 months (around 08/08/2022) for blood pressure.  Rubie Maid, FNP

## 2022-05-10 ENCOUNTER — Other Ambulatory Visit: Payer: Self-pay

## 2022-05-10 LAB — COMPLETE METABOLIC PANEL WITH GFR
AG Ratio: 1.6 (calc) (ref 1.0–2.5)
ALT: 6 U/L (ref 6–29)
AST: 10 U/L (ref 10–35)
Albumin: 4.4 g/dL (ref 3.6–5.1)
Alkaline phosphatase (APISO): 103 U/L (ref 37–153)
BUN: 18 mg/dL (ref 7–25)
CO2: 22 mmol/L (ref 20–32)
Calcium: 10.1 mg/dL (ref 8.6–10.4)
Chloride: 107 mmol/L (ref 98–110)
Creat: 0.72 mg/dL (ref 0.50–1.03)
Globulin: 2.7 g/dL (calc) (ref 1.9–3.7)
Glucose, Bld: 91 mg/dL (ref 65–99)
Potassium: 4.6 mmol/L (ref 3.5–5.3)
Sodium: 139 mmol/L (ref 135–146)
Total Bilirubin: 0.3 mg/dL (ref 0.2–1.2)
Total Protein: 7.1 g/dL (ref 6.1–8.1)
eGFR: 99 mL/min/{1.73_m2} (ref >=60)

## 2022-05-10 LAB — LIPID PANEL
Cholesterol: 241 mg/dL — ABNORMAL HIGH (ref <200)
HDL: 45 mg/dL — ABNORMAL LOW (ref >=50)
LDL Cholesterol (Calc): 168 mg/dL (calc) — ABNORMAL HIGH
Non-HDL Cholesterol (Calc): 196 mg/dL (calc) — ABNORMAL HIGH (ref <130)
Total CHOL/HDL Ratio: 5.4 (calc) — ABNORMAL HIGH (ref <5.0)
Triglycerides: 142 mg/dL (ref <150)

## 2022-05-10 MED ORDER — ATORVASTATIN CALCIUM 40 MG PO TABS: 40.0000 mg | ORAL_TABLET | Freq: Every day | ORAL | 3 refills | Status: DC

## 2022-06-04 ENCOUNTER — Ambulatory Visit (HOSPITAL_COMMUNITY): Payer: Medicaid Other

## 2022-06-05 ENCOUNTER — Ambulatory Visit: Payer: Medicaid Other | Attending: Internal Medicine | Admitting: Internal Medicine

## 2022-06-05 ENCOUNTER — Encounter: Payer: Self-pay | Admitting: Internal Medicine

## 2022-06-05 VITALS — BP 124/85 | HR 91 | Resp 16 | Ht 59.0 in | Wt 151.0 lb

## 2022-06-05 DIAGNOSIS — M19041 Primary osteoarthritis, right hand: Secondary | ICD-10-CM | POA: Diagnosis not present

## 2022-06-05 DIAGNOSIS — R700 Elevated erythrocyte sedimentation rate: Secondary | ICD-10-CM | POA: Diagnosis not present

## 2022-06-05 DIAGNOSIS — M25571 Pain in right ankle and joints of right foot: Secondary | ICD-10-CM | POA: Diagnosis not present

## 2022-06-05 DIAGNOSIS — M19042 Primary osteoarthritis, left hand: Secondary | ICD-10-CM | POA: Diagnosis not present

## 2022-06-05 MED ORDER — PREDNISONE 5 MG PO TABS
ORAL_TABLET | ORAL | 0 refills | Status: AC
Start: 2022-06-05 — End: 2022-06-16

## 2022-06-05 NOTE — Progress Notes (Signed)
Office Visit Note  Patient: Pamela Livingston             Date of Birth: 02/04/1965           MRN: [id number]             PCP: Park Meo, FNP Referring: Park Meo, FNP Visit Date: 06/05/2022   Subjective:  Follow-up (Patient states both of her hands are hurting bad.)   History of Present Illness: Pamela Livingston is a 57 y.o. female here for follow up For joint pain multiple areas previous findings of generalized osteoarthritis with some questionable inflammatory process at visit in 2022.  Was recommended to come back due to increased symptoms she is experiencing hand pain and swelling on both sides.  Also getting increased leg pain worse on the right side.  Will try wearing compressive gloves use of Tylenol and gabapentin without very good relief so far.   Previous HPI 11/01/20  Pamela Livingston is a 57 y.o. female here for follow up with osteoarthritis and joint pains. Left thumb has improved partially after local steroid injection and wearing a compressive glove. The cooler weather has increased symptoms somewhat with more pain and stiffness at the thumb. She went for left knee MRI this demonstrated partial thickness chondral defect of the medial trochlea otherwise was unremarkable. Otherwise knee pain and instability is unchanged.    Previous HPI 10/04/20 Pamela Livingston is a 57 y.o. female here for follow up for joint pain in multiple sites at her last visit hand x-rays demonstrated osteoarthritis but the left knee no findings to explain the continued joint pain swelling stiffness and instability.  He has not had any new fall continues to walk with a cane for additional stability.  She continues having intermittent numbness or cold sensation or paresthesias distal to the left knee.  No significant changes in symptoms compared to at our initial visit.    09/20/20 Pamela Livingston is a 57 y.o. 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 She saw vascular surgery with unremarkable workup including doppler ultrasound, ABIs, and abdominal CT angiogram.  She describes left leg symptoms started since about 1 year ago with pain pretty much the entire left leg worst at the level of the knee.  Also new development of a persistent livedo reticularis rash pattern on the distal left leg.  There is some weakness raising the leg and gait instability noted using a cane for support since about 6 months ago.  She has developed some intermittent numbness of the toes on the left foot but none elsewhere.  There is intermittent swelling of the left knee this was never aspirated and no recent joint imaging.  Since about 6 months ago describes worsening pain in her bilateral thumbs and bilateral elbows with some intermittent swelling at the left elbow.  She has taken prednisone off and on for many years due to her COPD.  Trial of gabapentin was partially beneficial for symptoms.  Otherwise no particular treatments at this time. She has a history of gastritis negative for H. pylori on endoscopy biopsies. She denies skin rashes elsewhere on the body. Only swelling areas are left foot, left knee, and left elbow intermittently. No falls or injuries although she feels unsteady walking with cane currently.   Labs reviewed 06/2020 ANA neg RF neg ESR 55 CRP 0.7 CBC wnl CMP unremarkable   Review of Systems  Constitutional:  Negative for fatigue.  HENT:  Negative for mouth sores and mouth dryness.   Eyes:  Negative for dryness.  Respiratory:  Positive for shortness of breath.   Cardiovascular:  Negative for chest pain and palpitations.  Gastrointestinal:  Negative for blood in stool, constipation and diarrhea.  Endocrine: Negative for increased urination.  Genitourinary:  Negative for involuntary urination.  Musculoskeletal:  Positive for joint pain, joint pain, joint swelling, muscle weakness, morning stiffness and muscle tenderness. Negative  for gait problem, myalgias and myalgias.  Skin:  Negative for color change, rash, hair loss and sensitivity to sunlight.  Allergic/Immunologic: Negative for susceptible to infections.  Neurological:  Negative for dizziness and headaches.  Hematological:  Negative for swollen glands.  Psychiatric/Behavioral:  Positive for sleep disturbance. Negative for depressed mood. The patient is not nervous/anxious.     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   Esophageal dysphagia 09/30/2019   GERD (gastroesophageal reflux disease) 05/27/2019   Cigarette nicotine dependence with nicotine-induced disorder 04/15/2015   Chronic obstructive pulmonary disease (HCC) 04/15/2015   Hyperlipidemia 04/15/2015   Abnormal mammogram 04/15/2015   History of substance abuse ([facility]) 03/07/2015   Esophageal reflux 03/07/2015    Past Medical History:  Diagnosis Date   Asthma    Chronic pelvic pain in female    COPD (chronic obstructive pulmonary disease) ([facility])    Endometriosis    Pneumonia 10/2021   Polysubstance abuse ([facility])    marijuana, cocaine, benzos   Stomach ulcer    Suicide attempt ([facility]) 2008   by phenergan overdose   Syncope     Family History  Problem Relation Age of Onset   Heart disease Mother    Hypertension Mother    Diabetes Mother    Rheum arthritis Mother    Heart disease Father    Hypertension Father    Heart attack Sister    Stroke  Sister    Dementia Sister    Heart attack Maternal Uncle    Heart attack Maternal Uncle    COPD Paternal Grandfather    Colon cancer Neg Hx    Past Surgical History:  Procedure Laterality Date   ABDOMINAL HYSTERECTOMY     BALLOON DILATION N/A 07/26/2020   Procedure: BALLOON DILATION;  Surgeon: Lanelle Bal, DO;  [facility];  Service: Endoscopy;  Laterality: N/A;   BIOPSY  07/26/2020   Procedure: BIOPSY;  Surgeon: Lanelle Bal, DO;  [facility];  Service: Endoscopy;;   BIOPSY  11/14/2021   Procedure: BIOPSY;  Surgeon: Iva Boop, MD;  Location: [facility] END[facility]COPY;  Service: Gastroenterology;;   CERVICAL CONE BIOPSY     cervical cancer   CESAREAN SECTION  1993   CHOLECYSTECTOMY     COLONOSCOPY WITH PROPOFOL N/A 07/26/2020   Procedure: COLONOSCOPY WITH PROPOFOL;  Surgeon: Lanelle Bal, DO;  [facility];  Service: Endoscopy;  Laterality: N/A;  12:00pm   ESOPHAGOGASTRODUODENOSCOPY (EGD)  WITH PROPOFOL N/A 07/26/2020   Procedure: ESOPHAGOGASTRODUODENOSCOPY (EGD) WITH PROPOFOL;  Surgeon: Lanelle Bal, DO;  [facility];  Service: Endoscopy;  Laterality: N/A;   ESOPHAGOGASTRODUODENOSCOPY (EGD) WITH PROPOFOL N/A 11/14/2021   Procedure: ESOPHAGOGASTRODUODENOSCOPY (EGD) WITH PROPOFOL;  Surgeon: Iva Boop, MD;  Location: [facility] ENDOSCOPY;  Service: Gastroenterology;  Laterality: N/A;   RADICAL HYSTERECTOMY WITH TRANSPOSITION OF OVARIES     TOOTH EXTRACTION     TUBAL LIGATION  1993   Social History   Social History Narrative   Not on file   Immunization History  Administered Date(s) Administered   Influenza,inj,Quad PF,6+ Mos 11/03/2017   Moderna Sars-Covid-2 Vaccination 05/07/2019, 06/04/2019   Zoster Recombinat (Shingrix) 01/24/2022, 04/25/2022     Objective: Vital Signs: BP 124/85 (BP Location: Left Arm, Patient Position: Sitting, Cuff Size: Normal)   Pulse 91   Resp 16   Ht 4\' 11"  (1.499 m)   Wt 151 lb (68.5 kg)   BMI  30.50 kg/m    Physical Exam Cardiovascular:     Rate and Rhythm: Normal rate and regular rhythm.  Pulmonary:     Effort: Pulmonary effort is normal.     Breath sounds: Normal breath sounds.  Skin:    General: Skin is warm and dry.     Findings: No rash.  Neurological:     Mental Status: She is alert.  Psychiatric:        Mood and Affect: Mood normal.      Musculoskeletal Exam:  Shoulders full ROM no tenderness or swelling Elbows full ROM no tenderness or swelling Wrists full ROM no tenderness or swelling Fingers full ROM tender to pressure at 1st CMC joints and throughout PIP joints, palpable synovitis at right 1st CMC Knees no tenderness or palpable effusions Right distal leg with lateral pain and worse with ankle inversion   Investigation: No additional findings.  Imaging: No results found.  Recent Labs: Lab Results  Component Value Date   WBC 5.7 01/31/2022   HGB 12.0 01/31/2022   PLT 353 01/31/2022   NA 139 05/09/2022   K 4.6 05/09/2022   CL 107 05/09/2022   CO2 22 05/09/2022   GLUCOSE 91 05/09/2022   BUN 18 05/09/2022   CREATININE 0.72 05/09/2022   BILITOT 0.3 05/09/2022   ALKPHOS 87 01/31/2022   AST 10 05/09/2022   ALT 6 05/09/2022   PROT 7.1 05/09/2022   ALBUMIN 4.2 01/31/2022   CALCIUM 10.1 05/09/2022   GFRAA >60 08/03/2019    Speciality Comments: No specialty comments available.  Procedures:  No procedures performed Allergies: Asa [aspirin] and Codeine   Assessment / Plan:     Visit Diagnoses: Primary osteoarthritis of both hands - Plan: predniSONE (DELTASONE) 5 MG tablet  Previous evaluation with hand pain on both sides attributed to primary osteoarthritis.  There is some more objective peripheral joint synovitis on exam today this could be suggestive for a seronegative rheumatoid arthritis.  Checking sedimentation rate and CRP for systemic inflammatory disease assessment.  Also started on a prednisone taper initially from 20 mg down over 2  weeks to see degree of response.  Pain in right ankle and joints of right foot - Plan: predniSONE (DELTASONE) 5 MG tablet  Not sure if the right ankle pain is related to the inflammation as elsewhere.  Distribution is most suggestive for something like peroneal tendinopathy versus true joint disease.   Orders: Orders Placed This Encounter  Procedures   Sedimentation rate   C-reactive protein  Meds ordered this encounter  Medications   predniSONE (DELTASONE) 5 MG tablet    Sig: Take 4 tablets (20 mg total) by mouth daily with breakfast for 3 days, THEN 3 tablets (15 mg total) daily with breakfast for 3 days, THEN 2 tablets (10 mg total) daily with breakfast for 3 days, THEN 1 tablet (5 mg total) daily with breakfast for 3 days.    Dispense:  30 tablet    Refill:  0     Follow-Up Instructions: Return in about 4 weeks (around 07/03/2022) for OA/?RA GC taper  f/u 40mo.   Fuller Plan, MD  Note - This record has been created using [employer].  Chart creation errors have been sought, but may not always  have been located. Such creation errors do not reflect on  the standard of medical care.

## 2022-06-06 LAB — SEDIMENTATION RATE: Sed Rate: 34 mm/h — ABNORMAL HIGH (ref 0–30)

## 2022-06-06 LAB — C-REACTIVE PROTEIN: CRP: 10.9 mg/L — ABNORMAL HIGH (ref <8.0)

## 2022-06-22 ENCOUNTER — Ambulatory Visit (HOSPITAL_COMMUNITY)
Admission: RE | Admit: 2022-06-22 | Discharge: 2022-06-22 | Disposition: A | Discharge status: Home / Self Care | Payer: Medicaid Other | Source: Ambulatory Visit | Attending: Pulmonary Disease | Admitting: Pulmonary Disease

## 2022-06-22 DIAGNOSIS — R911 Solitary pulmonary nodule: Secondary | ICD-10-CM | POA: Diagnosis present

## 2022-07-04 ENCOUNTER — Encounter: Payer: Self-pay | Admitting: Internal Medicine

## 2022-07-04 ENCOUNTER — Ambulatory Visit: Payer: Medicaid Other | Attending: Internal Medicine | Admitting: Internal Medicine

## 2022-07-04 VITALS — BP 96/70 | HR 82 | Resp 16 | Ht 59.0 in | Wt 148.0 lb

## 2022-07-04 DIAGNOSIS — M19041 Primary osteoarthritis, right hand: Secondary | ICD-10-CM | POA: Diagnosis not present

## 2022-07-04 DIAGNOSIS — R231 Pallor: Secondary | ICD-10-CM

## 2022-07-04 DIAGNOSIS — R700 Elevated erythrocyte sedimentation rate: Secondary | ICD-10-CM

## 2022-07-04 DIAGNOSIS — M19042 Primary osteoarthritis, left hand: Secondary | ICD-10-CM

## 2022-07-04 LAB — SEDIMENTATION RATE: Sed Rate: 45 mm/h — ABNORMAL HIGH (ref 0–30)

## 2022-07-04 NOTE — Progress Notes (Signed)
Office Visit Note  Patient: Pamela Livingston             Date of Birth: 08/17/1965           MRN: [id number]             PCP: Park Meo, FNP Referring: Park Meo, FNP Visit Date: 07/04/2022   Subjective:  Follow-up (Patient states she is having gum surgery tomorrow. )   History of Present Illness: Pamela Livingston is a 57 y.o. female here for follow up for arthritis multiple areas especially had increased hand pain and some swelling last month.  Labs at that time showed increase in sedimentation rate and C-reactive protein recommended trial of prednisone taper starting at 20 mg.  She states that the prednisone did not improve her symptoms significantly.  Leg rashes swelling remains good though.  She is having problems with her gums and has surgery scheduled for tomorrow.  Besides her chin and remains on gabapentin 300 mg twice daily she thinks is partially helpful.  Previous HPI 06/05/22 Pamela Livingston is a 57 y.o. female here for follow up For joint pain multiple areas previous findings of generalized osteoarthritis with some questionable inflammatory process at visit in 2022.  Was recommended to come back due to increased symptoms she is experiencing hand pain and swelling on both sides.  Also getting increased leg pain worse on the right side.  Will try wearing compressive gloves use of Tylenol and gabapentin without very good relief so far.   Previous HPI 11/01/20  Pamela Livingston is a 56 y.o. female here for follow up with osteoarthritis and joint pains. Left thumb has improved partially after local steroid injection and wearing a compressive glove. The cooler weather has increased symptoms somewhat with more pain and stiffness at the thumb. She went for left knee MRI this demonstrated partial thickness chondral defect of the medial trochlea otherwise was unremarkable. Otherwise knee pain and instability is unchanged.    Previous HPI 10/04/20 Pamela Livingston is a 57 y.o. female here for follow  up for joint pain in multiple sites at her last visit hand x-rays demonstrated osteoarthritis but the left knee no findings to explain the continued joint pain swelling stiffness and instability.  He has not had any new fall continues to walk with a cane for additional stability.  She continues having intermittent numbness or cold sensation or paresthesias distal to the left knee.  No significant changes in symptoms compared to at our initial visit.    09/20/20 Pamela Livingston is a 57 y.o. 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 She saw vascular surgery with unremarkable workup including doppler ultrasound, ABIs, and abdominal CT angiogram.  She describes left leg symptoms started since about 1 year ago with pain pretty much the entire left leg worst at the level of the knee.  Also new development of a persistent livedo reticularis rash pattern on the distal left leg.  There is some weakness raising the leg and gait instability noted using a cane for support since about 6 months ago.  She has developed some intermittent numbness of the toes on the left foot but none elsewhere.  There is intermittent swelling of the left knee this was never aspirated and no recent joint imaging.  Since about 6 months ago describes worsening pain in her bilateral thumbs and bilateral elbows with some intermittent swelling at the  left elbow.  She has taken prednisone off and on for many years due to her COPD.  Trial of gabapentin was partially beneficial for symptoms.  Otherwise no particular treatments at this time. She has a history of gastritis negative for H. pylori on endoscopy biopsies. She denies skin rashes elsewhere on the body. Only swelling areas are left foot, left knee, and left elbow intermittently. No falls or injuries although she feels unsteady walking with cane currently.   Labs reviewed 06/2020 ANA neg RF neg ESR 55 CRP 0.7 CBC  wnl CMP unremarkable   Review of Systems  Constitutional:  Negative for fatigue.  HENT:  Negative for mouth sores and mouth dryness.   Eyes:  Negative for dryness.  Respiratory:  Positive for shortness of breath.   Cardiovascular:  Negative for chest pain and palpitations.  Gastrointestinal:  Negative for blood in stool, constipation and diarrhea.  Endocrine: Negative for increased urination.  Genitourinary:  Negative for involuntary urination.  Musculoskeletal:  Positive for joint pain, joint pain, joint swelling, muscle weakness, morning stiffness and muscle tenderness. Negative for gait problem, myalgias and myalgias.  Skin:  Negative for color change, rash, hair loss and sensitivity to sunlight.  Allergic/Immunologic: Positive for susceptible to infections.  Neurological:  Negative for dizziness and headaches.  Hematological:  Negative for swollen glands.  Psychiatric/Behavioral:  Negative for depressed mood and sleep disturbance. The patient is not nervous/anxious.     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   Esophageal dysphagia 09/30/2019   GERD (gastroesophageal reflux disease) 05/27/2019   Cigarette nicotine dependence with nicotine-induced disorder 04/15/2015   Chronic obstructive pulmonary disease (HCC) 04/15/2015   Hyperlipidemia 04/15/2015   Abnormal mammogram 04/15/2015   History of substance abuse (HCC) 03/07/2015    Esophageal reflux 03/07/2015    Past Medical History:  Diagnosis Date   Asthma    Chronic pelvic pain in female    COPD (chronic obstructive pulmonary disease) (HCC)    Endometriosis    Pneumonia 10/2021   Polysubstance abuse (HCC)    marijuana, cocaine, benzos   Stomach ulcer    Suicide attempt (HCC) 2008   by phenergan overdose   Syncope     Family History  Problem Relation Age of Onset   Heart disease Mother    Hypertension Mother    Diabetes Mother    Rheum arthritis Mother    Heart disease Father    Hypertension Father    Heart attack Sister    Stroke Sister    Dementia Sister    Heart attack Maternal Uncle    Heart attack Maternal Uncle    COPD Paternal Grandfather    Colon cancer Neg Hx    Past Surgical History:  Procedure Laterality Date   ABDOMINAL HYSTERECTOMY     BALLOON DILATION N/A 07/26/2020   Procedure: BALLOON DILATION;  Surgeon: Lanelle Bal, DO;  [facility];  Service: Endoscopy;  Laterality: N/A;  BIOPSY  07/26/2020   Procedure: BIOPSY;  Surgeon: Lanelle Bal, DO;  [facility];  Service: Endoscopy;;   BIOPSY  11/14/2021   Procedure: BIOPSY;  Surgeon: Iva Boop, MD;  Location: [facility];  Service: Gastroenterology;;   CERVICAL CONE BIOPSY     cervical cancer   CESAREAN SECTION  1993   CHOLECYSTECTOMY     COLONOSCOPY WITH PROPOFOL N/A 07/26/2020   Procedure: COLONOSCOPY WITH PROPOFOL;  Surgeon: Lanelle Bal, DO;  [facility]: [facility];  Service: Endoscopy;  Laterality: N/A;  12:00pm   ESOPHAGOGASTRODUODENOSCOPY (EGD) WITH PROPOFOL N/A 07/26/2020   Procedure: ESOPHAGOGASTRODUODENOSCOPY (EGD) WITH PROPOFOL;  Surgeon: Lanelle Bal, DO;  [facility];  Service: Endoscopy;  Laterality: N/A;   ESOPHAGOGASTRODUODENOSCOPY (EGD) WITH PROPOFOL N/A 11/14/2021   Procedure: ESOPHAGOGASTRODUODENOSCOPY (EGD) WITH PROPOFOL;  Surgeon: Iva Boop, MD;  Location[facility];  Service: Gastroenterology;   Laterality: N/A;   RADICAL HYSTERECTOMY WITH TRANSPOSITION OF OVARIES     TOOTH EXTRACTION     TUBAL LIGATION  1993   Social History   Social History Narrative   Not on file   Immunization History  Administered Date(s) Administered   Influenza,inj,Quad PF,6+ Mos 11/03/2017   Moderna Sars-Covid-2 Vaccination 05/07/2019, 06/04/2019   Zoster Recombinat (Shingrix) 01/24/2022, 04/25/2022     Objective: Vital Signs: BP 96/70 (BP Location: Left Arm, Patient Position: Sitting, Cuff Size: Normal)   Pulse 82   Resp 16   Ht 4\' 11"  (1.499 m)   Wt 148 lb (67.1 kg)   BMI 29.89 kg/m    Physical Exam Cardiovascular:     Rate and Rhythm: Normal rate and regular rhythm.  Pulmonary:     Effort: Pulmonary effort is normal.     Breath sounds: Normal breath sounds.  Skin:    General: Skin is warm and dry.     Findings: No rash.  Neurological:     Mental Status: She is alert.      Musculoskeletal Exam:  Shoulders full ROM no tenderness or swelling Elbows full ROM no tenderness or swelling Wrists full ROM no tenderness or swelling Fingers full ROM there is tenderness at the [facility] joints bilaterally, no palpable synovitis Knees full ROM no tenderness or swelling Right lateral calf pain and worse with ankle inversion range of motion, no palpable swelling  Investigation: No additional findings.  Imaging: CT Chest Wo Contrast  Result Date: 06/27/2022 CLINICAL DATA:  Follow-up lung nodules EXAM: CT CHEST WITHOUT CONTRAST TECHNIQUE: Multidetector CT imaging of the chest was performed following the standard protocol without IV contrast. RADIATION DOSE REDUCTION: This exam was performed according to the departmental dose-optimization program which includes automated exposure control, adjustment of the mA and/or kV according to patient size and/or use of iterative reconstruction technique. COMPARISON:  Chest radiograph 01/31/2022 and CT 11/20/2021 FINDINGS: Cardiovascular: Normal heart size. No  pericardial effusion. Coronary artery and aortic atherosclerotic calcification. Mediastinum/Nodes: Patent trachea. Unremarkable esophagus. No adenopathy. Lungs/Pleura: Bibasilar scarring/atelectasis. There are a few patchy ground-glass opacities in the left lower lobe. The previous right pleural effusion has resolved. The previous 6 mm pulmonary nodule in the left upper lobe has resolved. No pneumothorax. Upper Abdomen: No acute abnormality. Musculoskeletal: No acute fracture or destructive osseous lesion. IMPRESSION: 1. Few patchy ground-glass opacities in the left lower lobe, likely infectious/inflammatory. 2. Resolution of previous right pleural effusion. 3. Resolution of the 6 mm left upper lobe nodule. Aortic Atherosclerosis (ICD10-I70.0). Electronically Signed   By: Minerva Fester  M.D.   On: 06/27/2022 23:00    Recent Labs: Lab Results  Component Value Date   WBC 5.7 01/31/2022   HGB 12.0 01/31/2022   PLT 353 01/31/2022   NA 139 05/09/2022   K 4.6 05/09/2022   CL 107 05/09/2022   CO2 22 05/09/2022   GLUCOSE 91 05/09/2022   BUN 18 05/09/2022   CREATININE 0.72 05/09/2022   BILITOT 0.3 05/09/2022   ALKPHOS 87 01/31/2022   AST 10 05/09/2022   ALT 6 05/09/2022   PROT 7.1 05/09/2022   ALBUMIN 4.2 01/31/2022   CALCIUM 10.1 05/09/2022   GFRAA >60 08/03/2019    Speciality Comments: No specialty comments available.  Procedures:  No procedures performed Allergies: Asa [aspirin] and Codeine   Assessment / Plan:     Visit Diagnoses: Primary osteoarthritis of both hands  Hand pain is most consistent with some osteoarthritis symptoms localized mostly to the first [facility] joint of both hands.  There is no appreciable synovitis.  Lack of response to 20 mg prednisone very unusual for inflammatory arthritis.  Might recommend to see hand specialist for evaluation see if there is need for local procedure or more advanced imaging.  Sedimentation rate elevation - Plan: Sedimentation rate Livedo  reticularis  No clear underlying process identified so far.  Does not show evidence of systemic vasculitis.  Lab results have shown inconsistent mildly elevated serum inflammatory markers but otherwise nonspecific.  Orders: Orders Placed This Encounter  Procedures   Sedimentation rate   No orders of the defined types were placed in this encounter.    Follow-Up Instructions: Return if symptoms worsen or fail to improve.   Fuller Plan, MD  Note - This record has been created using [employer].  Chart creation errors have been sought, but may not always  have been located. Such creation errors do not reflect on  the standard of medical care.

## 2022-07-15 ENCOUNTER — Other Ambulatory Visit: Payer: Self-pay | Admitting: Family Medicine

## 2022-07-17 ENCOUNTER — Inpatient Hospital Stay: Admission: RE | Admit: 2022-07-17 | Payer: Medicaid Other | Source: Ambulatory Visit

## 2022-07-23 NOTE — Telephone Encounter (Signed)
Requested Prescriptions  Pending Prescriptions Disp Refills   omeprazole (PRILOSEC) 40 MG capsule [Pharmacy Med Name: Omeprazole 40 MG Oral Capsule Delayed Release] 90 capsule 2    Sig: TAKE 1 CAPSULE BY MOUTH IN THE MORNING AND AT BEDTIME     Gastroenterology: Proton Pump Inhibitors Failed - 07/23/2022  1:14 PM      Failed - Valid encounter within last 12 months    Recent Outpatient Visits   None     Future Appointments             In 2 months Oretha Milch, MD [facility]

## 2022-07-23 NOTE — Telephone Encounter (Signed)
Prescription Request  07/23/2022  LOV: 05/09/2022  What is the name of the medication or equipment?   omeprazole (PRILOSEC) 40 MG capsule  **Received refill request from pharmacy**  Have you contacted your pharmacy to request a refill? Yes   Which pharmacy would you like this sent to?  [facility] [address] [place] - [address] [place] [address] Phone: [phone] Fax: [phone]    Patient notified that their request is being sent to the clinical staff for review and that they should receive a response within 2 business days.   Please advise pharmacist.

## 2022-07-25 ENCOUNTER — Other Ambulatory Visit: Payer: Self-pay | Admitting: Family Medicine

## 2022-08-08 ENCOUNTER — Ambulatory Visit: Payer: Medicaid Other | Admitting: Family Medicine

## 2022-08-08 ENCOUNTER — Encounter: Payer: Self-pay | Admitting: Family Medicine

## 2022-08-08 VITALS — BP 120/70 | HR 84 | Temp 97.6°F | Ht 59.0 in | Wt 147.8 lb

## 2022-08-08 DIAGNOSIS — I10 Essential (primary) hypertension: Secondary | ICD-10-CM | POA: Diagnosis not present

## 2022-08-08 DIAGNOSIS — E782 Mixed hyperlipidemia: Secondary | ICD-10-CM | POA: Diagnosis not present

## 2022-08-08 MED ORDER — CYCLOBENZAPRINE HCL 10 MG PO TABS: 10.0000 mg | ORAL_TABLET | Freq: Three times a day (TID) | ORAL | 0 refills | Status: DC | PRN

## 2022-08-08 MED ORDER — GABAPENTIN 300 MG PO CAPS: 300.0000 mg | ORAL_CAPSULE | Freq: Three times a day (TID) | ORAL | 3 refills | Status: DC

## 2022-08-08 MED ORDER — ALBUTEROL SULFATE HFA 108 (90 BASE) MCG/ACT IN AERS: 2.0000 | INHALATION_SPRAY | Freq: Four times a day (QID) | RESPIRATORY_TRACT | 0 refills | Status: DC | PRN

## 2022-08-08 NOTE — Assessment & Plan Note (Signed)
BP at goal <130/80, checking BID at home. Continue Lisinopril 20mg  daily. Continue to monitor BP at home daily and report to office if sustains >140/90 or for readings <100/60. Seek medical care for BP >180/110, chest pain, shortness of breath, palpitations, dizziness, worsening or persist edema. Follow up in 6 months. CMP and lipids today

## 2022-08-08 NOTE — Progress Notes (Signed)
Subjective:  HPI: Pamela Livingston is a 57 y.o. female presenting on 08/08/2022 for Follow-up (3 month follow up on medications. Pt states she is taking 2 Gabapentin per day but it is not helping. Pt states she is also having issues with swelling to legs. Pt would like refill on muscle relaxers and on albuterol. )   HPI Patient is in today for hypertension follow-up. She also requests refills on her Flexeril for her back pain, she uses this a few times a week when she is working, and Albuterol, she uses this almost once daily with the hot weather. She also requests increase in her Gabapentin to TID stating it works well but wears off between doses.  HYPERTENSION without Chronic Kidney Disease Hypertension status: controlled  Satisfied with current treatment? yes Duration of hypertension: chronic BP monitoring frequency:  a few times a day BP range: 125-120/70-73 BP medication side effects:  no Medication compliance: excellent compliance Previous BP meds:lisinopril Aspirin: no Recurrent headaches: no Visual changes: no Palpitations: no Dyspnea: no Chest pain: no Lower extremity edema: no Dizzy/lightheaded: no  Review of Systems  All other systems reviewed and are negative.   Relevant past medical history reviewed and updated as indicated.   Past Medical History:  Diagnosis Date   Asthma    Chronic pelvic pain in female    COPD (chronic obstructive pulmonary disease) (HCC)    Endometriosis    Pneumonia 10/2021   Polysubstance abuse (HCC)    marijuana, cocaine, benzos   Stomach ulcer    Suicide attempt (HCC) 2008   by phenergan overdose   Syncope      Past Surgical History:  Procedure Laterality Date   ABDOMINAL HYSTERECTOMY     BALLOON DILATION N/A 07/26/2020   Procedure: BALLOON DILATION;  Surgeon: Lanelle Bal, DO;  [facility];  Service: Endoscopy;  Laterality: N/A;   BIOPSY  07/26/2020   Procedure: BIOPSY;  Surgeon: Lanelle Bal, DO;  [facility];  Service: Endoscopy;;   BIOPSY  11/14/2021   Procedure: BIOPSY;  Surgeon: Iva Boop, MD;  [facility] END[facility]COPY;  Service: Gastroenterology;;   CERVICAL CONE BIOPSY     cervical cancer   CESAREAN SECTION  1993   CHOLECYSTECTOMY     COLONOSCOPY WITH PROPOFOL N/A 07/26/2020   Procedure: COLONOSCOPY WITH PROPOFOL;  Surgeon: Lanelle Bal, DO;  [facility];  Service: Endoscopy;  Laterality: N/A;  12:00pm   ESOPHAGOGASTRODUODENOSCOPY (EGD) WITH PROPOFOL N/A 07/26/2020   Procedure: ESOPHAGOGASTRODUODENOSCOPY (EGD) WITH PROPOFOL;  Surgeon: Lanelle Bal, DO;  [facility];  Service: Endoscopy;  Laterality: N/A;   ESOPHAGOGASTRODUODENOSCOPY (EGD) WITH PROPOFOL N/A 11/14/2021   Procedure: ESOPHAGOGASTRODUODENOSCOPY (EGD) WITH PROPOFOL;  Surgeon: Iva Boop, MD;  Location: [facility] ENDOSCOPY;  Service: Gastroenterology;  Laterality: N/A;   RADICAL HYSTERECTOMY WITH TRANSPOSITION OF OVARIES     TOOTH EXTRACTION     TUBAL LIGATION  1993    Allergies and medications reviewed and updated.   Current Outpatient Medications:    acetaminophen (TYLENOL) 325 MG tablet, Take 3 tablets by mouth every 6 (six) hours as needed for moderate pain., Disp: , Rfl:    atorvastatin (LIPITOR) 40 MG tablet, Take 1 tablet (40 mg total) by mouth daily., Disp: 90 tablet, Rfl: 3   diphenhydrAMINE (BENADRYL) 25 mg capsule, Take 25 mg by mouth at bedtime as needed for sleep., Disp: , Rfl:    fluticasone-salmeterol (ADVAIR DISKUS) 250-50 MCG/ACT AEPB, Inhale  1 puff into the lungs in the morning and at bedtime., Disp: 360 each, Rfl: 0   lisinopril (ZESTRIL) 20 MG tablet, Take 1 tablet by mouth once daily, Disp: 90 tablet, Rfl: 0   Loratadine-Pseudoephedrine (CLARITIN-D 12 HOUR PO), Take 1 tablet by mouth daily., Disp: , Rfl:    montelukast (SINGULAIR) 10 MG tablet, TAKE 1 TABLET BY MOUTH ONCE DAILY AS NEEDED, Disp: 90 tablet, Rfl: 0   nitroGLYCERIN (NITROSTAT) 0.4 MG SL tablet,  Place 1 tablet (0.4 mg total) under the tongue every 5 (five) minutes as needed for chest pain., Disp: 25 tablet, Rfl: prn   omeprazole (PRILOSEC) 40 MG capsule, TAKE 1 CAPSULE BY MOUTH IN THE MORNING AND AT BEDTIME, Disp: 90 capsule, Rfl: 2   albuterol (VENTOLIN HFA) 108 (90 Base) MCG/ACT inhaler, Inhale 2 puffs into the lungs every 6 (six) hours as needed for wheezing or shortness of breath., Disp: 8 g, Rfl: 0   cyclobenzaprine (FLEXERIL) 10 MG tablet, Take 1 tablet (10 mg total) by mouth 3 (three) times daily as needed for muscle spasms., Disp: 30 tablet, Rfl: 0   gabapentin (NEURONTIN) 300 MG capsule, Take 1 capsule (300 mg total) by mouth 3 (three) times daily., Disp: 60 capsule, Rfl: 3  Allergies  Allergen Reactions   Asa [Aspirin] Anaphylaxis, Hives and Swelling   Codeine Nausea Only    Objective:   BP 120/70   Pulse 84   Temp 97.6 F (36.4 C)   Ht 4\' 11"  (1.499 m)   Wt 147 lb 12.8 oz (67 kg)   SpO2 98%   BMI 29.85 kg/m      08/08/2022    8:05 AM 07/04/2022   10:40 AM 06/05/2022    1:41 PM  Vitals with BMI  Height 4\' 11"  4\' 11"  4\' 11"   Weight 147 lbs 13 oz 148 lbs 151 lbs  BMI 29.84 29.88 30.48  Systolic 120 96 124  Diastolic 70 70 85  Pulse 84 82 91     Physical Exam Vitals and nursing note reviewed.  Constitutional:      Appearance: Normal appearance. She is normal weight.  HENT:     Head: Normocephalic and atraumatic.  Cardiovascular:     Rate and Rhythm: Normal rate and regular rhythm.     Pulses: Normal pulses.     Heart sounds: Normal heart sounds.  Pulmonary:     Effort: Pulmonary effort is normal.     Breath sounds: Wheezing present.  Skin:    General: Skin is warm and dry.  Neurological:     General: No focal deficit present.     Mental Status: She is alert and oriented to person, place, and time. Mental status is at baseline.  Psychiatric:        Mood and Affect: Mood normal.        Behavior: Behavior normal.        Thought Content: Thought  content normal.        Judgment: Judgment normal.     Assessment & Plan:  Primary hypertension Assessment & Plan: BP at goal <130/80, checking BID at home. Continue Lisinopril 20mg  daily. Continue to monitor BP at home daily and report to office if sustains >140/90 or for readings <100/60. Seek medical care for BP >180/110, chest pain, shortness of breath, palpitations, dizziness, worsening or persist edema. Follow up in 6 months. CMP and lipids today   Mixed hyperlipidemia Assessment & Plan: Well controlled on current regimen. Continue Atorvastatin 40mg  daily. CMP  and lipids today. Continue heart healthy diet and 150 minutes exercise weekly. The 10-year ASCVD risk score (Arnett DK, et al., 2019) is: 7.9%   Orders: -     COMPLETE METABOLIC PANEL WITH GFR -     Lipid panel  Other orders -     Gabapentin; Take 1 capsule (300 mg total) by mouth 3 (three) times daily.  Dispense: 60 capsule; Refill: 3 -     Albuterol Sulfate HFA; Inhale 2 puffs into the lungs every 6 (six) hours as needed for wheezing or shortness of breath.  Dispense: 8 g; Refill: 0 -     Cyclobenzaprine HCl; Take 1 tablet (10 mg total) by mouth 3 (three) times daily as needed for muscle spasms.  Dispense: 30 tablet; Refill: 0     Follow up plan: Return in about 6 months (around 02/08/2023) for annual physical with labs 1 week prior.  Park Meo, FNP

## 2022-08-08 NOTE — Assessment & Plan Note (Addendum)
Well controlled on current regimen. Continue Atorvastatin 40mg  daily. CMP and lipids today. Continue heart healthy diet and 150 minutes exercise weekly. The 10-year ASCVD risk score (Arnett DK, et al., 2019) is: 7.9%

## 2022-08-09 LAB — COMPLETE METABOLIC PANEL WITHOUT GFR
AG Ratio: 1.6 (calc) (ref 1.0–2.5)
ALT: 8 U/L (ref 6–29)
AST: 11 U/L (ref 10–35)
Albumin: 3.8 g/dL (ref 3.6–5.1)
Alkaline phosphatase (APISO): 109 U/L (ref 37–153)
BUN: 13 mg/dL (ref 7–25)
CO2: 25 mmol/L (ref 20–32)
Calcium: 9 mg/dL (ref 8.6–10.4)
Chloride: 109 mmol/L (ref 98–110)
Creat: 0.67 mg/dL (ref 0.50–1.03)
Globulin: 2.4 g/dL (ref 1.9–3.7)
Glucose, Bld: 86 mg/dL (ref 65–99)
Potassium: 4.2 mmol/L (ref 3.5–5.3)
Sodium: 142 mmol/L (ref 135–146)
Total Bilirubin: 0.3 mg/dL (ref 0.2–1.2)
Total Protein: 6.2 g/dL (ref 6.1–8.1)
eGFR: 104 mL/min/1.73m2 (ref >=60)

## 2022-08-09 LAB — LIPID PANEL
Cholesterol: 146 mg/dL (ref <200)
HDL: 40 mg/dL — ABNORMAL LOW (ref >=50)
LDL Cholesterol (Calc): 86 mg/dL (calc)
Non-HDL Cholesterol (Calc): 106 mg/dL (calc) (ref <130)
Total CHOL/HDL Ratio: 3.7 (calc) (ref <5.0)
Triglycerides: 102 mg/dL (ref <150)

## 2022-08-14 ENCOUNTER — Inpatient Hospital Stay: Payer: Medicaid Other

## 2022-09-02 ENCOUNTER — Other Ambulatory Visit: Payer: Self-pay | Admitting: Family Medicine

## 2022-09-27 ENCOUNTER — Other Ambulatory Visit: Payer: Self-pay | Admitting: Family Medicine

## 2022-09-27 ENCOUNTER — Ambulatory Visit: Payer: Medicaid Other | Admitting: Pulmonary Disease

## 2022-09-27 ENCOUNTER — Encounter: Payer: Self-pay | Admitting: Pulmonary Disease

## 2022-10-02 ENCOUNTER — Encounter: Payer: Self-pay | Admitting: Family Medicine

## 2022-10-02 ENCOUNTER — Ambulatory Visit: Payer: Medicaid Other | Admitting: Family Medicine

## 2022-10-02 VITALS — BP 115/78 | HR 84 | Temp 97.7°F | Ht 59.0 in | Wt 146.0 lb

## 2022-10-02 DIAGNOSIS — R0982 Postnasal drip: Secondary | ICD-10-CM | POA: Insufficient documentation

## 2022-10-02 NOTE — Progress Notes (Signed)
Subjective:  HPI: Pamela Livingston is a 57 y.o. female presenting on 10/02/2022 for Follow-up (need to f/u/discuss meds for depression)   HPI Patient is in today for postnasal drip for a week and a feeling like she can't clear her throat. Denies sinus congestion, sore throat, ear pain, fever, worsening cough, shortness of breath. No difficulty swallowing, GERD is well controlled on Omeprazole. Has tried Mucinex, Delsym, and Claritin D   Review of Systems  All other systems reviewed and are negative.   Relevant past medical history reviewed and updated as indicated.   Past Medical History:  Diagnosis Date   Asthma    Chronic pelvic pain in female    COPD (chronic obstructive pulmonary disease) (HCC)    Endometriosis    Pneumonia 10/2021   Polysubstance abuse (HCC)    marijuana, cocaine, benzos   Stomach ulcer    Suicide attempt (HCC) 2008   by phenergan overdose   Syncope      Past Surgical History:  Procedure Laterality Date   ABDOMINAL HYSTERECTOMY     BALLOON DILATION N/A 07/26/2020   Procedure: BALLOON DILATION;  Surgeon: Lanelle Bal, DO;  [facility];  Service: Endoscopy;  Laterality: N/A;   BIOPSY  07/26/2020   Procedure: BIOPSY;  Surgeon: Lanelle Bal, DO;  [facility];  Service: Endoscopy;;   BIOPSY  11/14/2021   Procedure: BIOPSY;  Surgeon: Iva Boop, MD;  Location: [facility] ENDOSCOPY;  Service: Gastroenterology;;   CERVICAL CONE BIOPSY     cervical cancer   CESAREAN SECTION  1993   CHOLECYSTECTOMY     COLONOSCOPY WITH PROPOFOL N/A 07/26/2020   Procedure: COLONOSCOPY WITH PROPOFOL;  Surgeon: Lanelle Bal, DO;  [facility];  Service: Endoscopy;  Laterality: N/A;  12:00pm   ESOPHAGOGASTRODUODENOSCOPY (EGD) WITH PROPOFOL N/A 07/26/2020   Procedure: ESOPHAGOGASTRODUODENOSCOPY (EGD) WITH PROPOFOL;  Surgeon: Lanelle Bal, DO;  [facility];  Service: Endoscopy;  Laterality: N/A;   ESOPHAGOGASTRODUODENOSCOPY  (EGD) WITH PROPOFOL N/A 11/14/2021   Procedure: ESOPHAGOGASTRODUODENOSCOPY (EGD) WITH PROPOFOL;  Surgeon: Iva Boop, MD;  Location: [facility] ENDOSCOPY;  Service: Gastroenterology;  Laterality: N/A;   RADICAL HYSTERECTOMY WITH TRANSPOSITION OF OVARIES     TOOTH EXTRACTION     TUBAL LIGATION  1993    Allergies and medications reviewed and updated.   Current Outpatient Medications:    acetaminophen (TYLENOL) 325 MG tablet, Take 3 tablets by mouth every 6 (six) hours as needed for moderate pain., Disp: , Rfl:    atorvastatin (LIPITOR) 40 MG tablet, Take 1 tablet (40 mg total) by mouth daily., Disp: 90 tablet, Rfl: 3   cyclobenzaprine (FLEXERIL) 10 MG tablet, Take 1 tablet (10 mg total) by mouth 3 (three) times daily as needed for muscle spasms., Disp: 30 tablet, Rfl: 0   diphenhydrAMINE (BENADRYL) 25 mg capsule, Take 25 mg by mouth at bedtime as needed for sleep., Disp: , Rfl:    fluticasone-salmeterol (ADVAIR DISKUS) 250-50 MCG/ACT AEPB, Inhale 1 puff into the lungs in the morning and at bedtime., Disp: 360 each, Rfl: 0   gabapentin (NEURONTIN) 300 MG capsule, Take 1 capsule (300 mg total) by mouth 3 (three) times daily., Disp: 60 capsule, Rfl: 3   lisinopril (ZESTRIL) 20 MG tablet, Take 1 tablet by mouth once daily, Disp: 90 tablet, Rfl: 0   Loratadine-Pseudoephedrine (CLARITIN-D 12 HOUR PO), Take 1 tablet by mouth daily., Disp: , Rfl:    montelukast (SINGULAIR) 10 MG tablet,  TAKE 1 TABLET BY MOUTH ONCE DAILY AS NEEDED, Disp: 90 tablet, Rfl: 0   nitroGLYCERIN (NITROSTAT) 0.4 MG SL tablet, Place 1 tablet (0.4 mg total) under the tongue every 5 (five) minutes as needed for chest pain., Disp: 25 tablet, Rfl: prn   omeprazole (PRILOSEC) 40 MG capsule, TAKE 1 CAPSULE BY MOUTH IN THE MORNING AND AT BEDTIME, Disp: 90 capsule, Rfl: 2   VENTOLIN HFA 108 (90 Base) MCG/ACT inhaler, INHALE 2 PUFFS BY MOUTH EVERY 6 HOURS AS NEEDED FOR WHEEZING OR SHORTNESS OF BREATH, Disp: 18 g, Rfl: 0  Allergies  Allergen  Reactions   Asa [Aspirin] Anaphylaxis, Hives and Swelling   Codeine Nausea Only    Objective:   BP 115/78   Pulse 84   Temp 97.7 F (36.5 C) (Oral)   Ht 4\' 11"  (1.499 m)   Wt 146 lb (66.2 kg)   SpO2 96%   BMI 29.49 kg/m      10/02/2022   11:32 AM 08/08/2022    8:05 AM 07/04/2022   10:40 AM  Vitals with BMI  Height 4\' 11"  4\' 11"  4\' 11"   Weight 146 lbs 147 lbs 13 oz 148 lbs  BMI 29.47 29.84 29.88  Systolic 115 120 96  Diastolic 78 70 70  Pulse 84 84 82     Physical Exam Vitals and nursing note reviewed.  Constitutional:      Appearance: Normal appearance. She is normal weight.  HENT:     Head: Normocephalic and atraumatic.     Right Ear: Tympanic membrane, ear canal and external ear normal.     Left Ear: Tympanic membrane, ear canal and external ear normal.     Nose: Nose normal. No congestion or rhinorrhea.     Mouth/Throat:     Mouth: Mucous membranes are moist.     Pharynx: Oropharynx is clear.  Eyes:     Conjunctiva/sclera: Conjunctivae normal.  Musculoskeletal:     Cervical back: No tenderness.  Lymphadenopathy:     Cervical: No cervical adenopathy.  Skin:    General: Skin is warm and dry.  Neurological:     General: No focal deficit present.     Mental Status: She is alert and oriented to person, place, and time. Mental status is at baseline.  Psychiatric:        Mood and Affect: Mood normal.        Behavior: Behavior normal.        Thought Content: Thought content normal.        Judgment: Judgment normal.     Assessment & Plan:  Postnasal drip Assessment & Plan: Symptoms consistent with allergic postnasal drip. She does take Claritin D daily. Provided with sample of Astepro nasal spray. Follow up in office if symptoms persist or worsen.      Follow up plan: Return if symptoms worsen or fail to improve.  Park Meo, FNP

## 2022-10-02 NOTE — Assessment & Plan Note (Signed)
Symptoms consistent with allergic postnasal drip. She does take Claritin D daily. Provided with sample of Astepro nasal spray. Follow up in office if symptoms persist or worsen.

## 2022-10-12 ENCOUNTER — Encounter: Payer: Self-pay | Admitting: Family Medicine

## 2022-10-12 ENCOUNTER — Ambulatory Visit: Payer: Medicaid Other | Admitting: Family Medicine

## 2022-10-12 VITALS — BP 112/64 | HR 94 | Temp 97.7°F | Ht 59.0 in | Wt 145.2 lb

## 2022-10-12 DIAGNOSIS — S61502D Unspecified open wound of left wrist, subsequent encounter: Secondary | ICD-10-CM | POA: Diagnosis not present

## 2022-10-12 DIAGNOSIS — Z4802 Encounter for removal of sutures: Secondary | ICD-10-CM
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
## 2022-10-13 ENCOUNTER — Other Ambulatory Visit: Payer: Self-pay | Admitting: Family Medicine

## 2022-10-23 ENCOUNTER — Other Ambulatory Visit: Payer: Self-pay | Admitting: Family Medicine

## 2022-11-23 ENCOUNTER — Ambulatory Visit (INDEPENDENT_AMBULATORY_CARE_PROVIDER_SITE_OTHER): Payer: Medicaid Other

## 2022-11-23 DIAGNOSIS — Z23 Encounter for immunization: Secondary | ICD-10-CM

## 2022-11-23 NOTE — Progress Notes (Signed)
Pt here for flu vaccine. Given as per standing order. Pt tolerated well. Mjp,lpn

## 2022-11-26 ENCOUNTER — Other Ambulatory Visit: Payer: Self-pay | Admitting: Family Medicine

## 2022-11-27 ENCOUNTER — Ambulatory Visit: Payer: Medicaid Other | Admitting: Family Medicine

## 2022-11-27 ENCOUNTER — Encounter: Payer: Self-pay | Admitting: Family Medicine

## 2022-11-27 VITALS — BP 128/84 | HR 77 | Temp 97.5°F | Ht 59.0 in | Wt 147.0 lb

## 2022-11-27 DIAGNOSIS — J449 Chronic obstructive pulmonary disease, unspecified: Secondary | ICD-10-CM

## 2022-11-27 MED ORDER — STIOLTO RESPIMAT 2.5-2.5 MCG/ACT IN AERS: 2.0000 | INHALATION_SPRAY | Freq: Every day | RESPIRATORY_TRACT | 11 refills | Status: DC

## 2022-11-27 MED ORDER — PREDNISONE 50 MG PO TABS: ORAL_TABLET | ORAL | 0 refills | Status: DC

## 2022-11-27 MED ORDER — GABAPENTIN 400 MG PO CAPS: 400.0000 mg | ORAL_CAPSULE | Freq: Three times a day (TID) | ORAL | 1 refills | Status: DC

## 2022-11-27 NOTE — Progress Notes (Signed)
Subjective:  HPI: Pamela Livingston is a 57 y.o. female presenting on 11/27/2022 for Follow-up (f/u on COPD - difficulty breathing with weather changes/allergies - JBG\\\//)   HPI Patient is in today for worsening dry cough, shortness of breath, dyspnea when lying flat for several weeks. She reports feeling breathless when she walks to the bathroom, chest is full of phlegm she cannot get [place] despite using Mucinex and Claritin D. Denies recent viral illness, fever, body aches, chills. Is using Albuterol nebulizer twice daily and Ventolin daily.  Review of Systems  All other systems reviewed and are negative.   Relevant past medical history reviewed and updated as indicated.   Past Medical History:  Diagnosis Date   Asthma    Chronic pelvic pain in female    COPD (chronic obstructive pulmonary disease) (HCC)    Endometriosis    Pneumonia 10/2021   Polysubstance abuse (HCC)    marijuana, cocaine, benzos   Stomach ulcer    Suicide attempt (HCC) 2008   by phenergan overdose   Syncope      Past Surgical History:  Procedure Laterality Date   ABDOMINAL HYSTERECTOMY     BALLOON DILATION N/A 07/26/2020   Procedure: BALLOON DILATION;  Surgeon: Lanelle Bal, DO;  [facility];  Service: Endoscopy;  Laterality: N/A;   BIOPSY  07/26/2020   Procedure: BIOPSY;  Surgeon: Lanelle Bal, DO;  [facility];  Service: Endoscopy;;   BIOPSY  11/14/2021   Procedure: BIOPSY;  Surgeon: Iva Boop, MD;  Location: [facility] ENDOSCOPY;  Service: Gastroenterology;;   CERVICAL CONE BIOPSY     cervical cancer   CESAREAN SECTION  1993   CHOLECYSTECTOMY     COLONOSCOPY WITH PROPOFOL N/A 07/26/2020   Procedure: COLONOSCOPY WITH PROPOFOL;  Surgeon: Lanelle Bal, DO;  [facility];  Service: Endoscopy;  Laterality: N/A;  12:00pm   ESOPHAGOGASTRODUODENOSCOPY (EGD) WITH PROPOFOL N/A 07/26/2020   Procedure: ESOPHAGOGASTRODUODENOSCOPY (EGD) WITH PROPOFOL;  Surgeon: Lanelle Bal, DO;  [facility];  Service: Endoscopy;  Laterality: N/A;   ESOPHAGOGASTRODUODENOSCOPY (EGD) WITH PROPOFOL N/A 11/14/2021   Procedure: ESOPHAGOGASTRODUODENOSCOPY (EGD) WITH PROPOFOL;  Surgeon: Iva Boop, MD;  Location: [facility] ENDOSCOPY;  Service: Gastroenterology;  Laterality: N/A;   RADICAL HYSTERECTOMY WITH TRANSPOSITION OF OVARIES     TOOTH EXTRACTION     TUBAL LIGATION  1993    Allergies and medications reviewed and updated.   Current Outpatient Medications:    acetaminophen (TYLENOL) 325 MG tablet, Take 3 tablets by mouth every 6 (six) hours as needed for moderate pain., Disp: , Rfl:    atorvastatin (LIPITOR) 40 MG tablet, Take 1 tablet (40 mg total) by mouth daily., Disp: 90 tablet, Rfl: 3   cyclobenzaprine (FLEXERIL) 10 MG tablet, Take 1 tablet (10 mg total) by mouth 3 (three) times daily as needed for muscle spasms., Disp: 30 tablet, Rfl: 0   diphenhydrAMINE (BENADRYL) 25 mg capsule, Take 25 mg by mouth at bedtime as needed for sleep., Disp: , Rfl:    lisinopril (ZESTRIL) 20 MG tablet, Take 1 tablet by mouth once daily, Disp: 90 tablet, Rfl: 0   Loratadine-Pseudoephedrine (CLARITIN-D 12 HOUR PO), Take 1 tablet by mouth daily., Disp: , Rfl:    montelukast (SINGULAIR) 10 MG tablet, TAKE 1 TABLET BY MOUTH ONCE DAILY AS NEEDED, Disp: 90 tablet, Rfl: 0   nitroGLYCERIN (NITROSTAT) 0.4 MG SL tablet, Place 1 tablet (0.4 mg total) under the tongue every 5 (  five) minutes as needed for chest pain., Disp: 25 tablet, Rfl: prn   omeprazole (PRILOSEC) 40 MG capsule, TAKE 1 CAPSULE BY MOUTH IN THE MORNING AND AT BEDTIME, Disp: 90 capsule, Rfl: 2   predniSONE (DELTASONE) 50 MG tablet, One tab PO daily for 5 days., Disp: 5 tablet, Rfl: 0   Tiotropium Bromide-Olodaterol (STIOLTO RESPIMAT) 2.5-2.5 MCG/ACT AERS, Inhale 2 puffs into the lungs daily., Disp: 1 each, Rfl: 11   VENTOLIN HFA 108 (90 Base) MCG/ACT inhaler, INHALE 2 PUFFS BY MOUTH EVERY 6 HOURS AS NEEDED FOR WHEEZING OR  SHORTNESS OF BREATH, Disp: 18 g, Rfl: 0   gabapentin (NEURONTIN) 400 MG capsule, Take 1 capsule (400 mg total) by mouth 3 (three) times daily., Disp: 90 capsule, Rfl: 1  Allergies  Allergen Reactions   Asa [Aspirin] Anaphylaxis, Hives and Swelling   Codeine Nausea Only    Objective:   BP 128/84   Pulse 77   Temp (!) 97.5 F (36.4 C) (Oral)   Ht 4\' 11"  (1.499 m)   Wt 147 lb (66.7 kg)   SpO2 99%   BMI 29.69 kg/m      11/27/2022   10:28 AM 10/12/2022    4:01 PM 10/02/2022   11:32 AM  Vitals with BMI  Height 4\' 11"  4\' 11"  4\' 11"   Weight 147 lbs 145 lbs 3 oz 146 lbs  BMI 29.67 29.31 29.47  Systolic 128 112 782  Diastolic 84 64 78  Pulse 77 94 84     Physical Exam Vitals and nursing note reviewed.  Constitutional:      Appearance: Normal appearance. She is normal weight.  HENT:     Head: Normocephalic and atraumatic.  Pulmonary:     Effort: Pulmonary effort is normal. No tachypnea or respiratory distress.     Breath sounds: Wheezing and rhonchi present.  Skin:    General: Skin is warm and dry.  Neurological:     General: No focal deficit present.     Mental Status: She is alert and oriented to person, place, and time. Mental status is at baseline.  Psychiatric:        Mood and Affect: Mood normal.        Behavior: Behavior normal.        Thought Content: Thought content normal.        Judgment: Judgment normal.     Assessment & Plan:  Chronic obstructive pulmonary disease, unspecified COPD type (HCC) Assessment & Plan: mMRC 3 and CAT > 10, GOLD group B. She is currently using Ventolin and Advair. Will discontinue Advair and start Stiolto Respimat. Continue Ventolin PRN. Seek medical care if symptoms worsen or persist. Will also refer back to Pulm. Follow up in 1-2 weeks.  Orders: -     Ambulatory referral to Pulmonology  Other orders -     Stiolto Respimat; Inhale 2 puffs into the lungs daily.  Dispense: 1 each; Refill: 11 -     Gabapentin; Take 1 capsule  (400 mg total) by mouth 3 (three) times daily.  Dispense: 90 capsule; Refill: 1 -     predniSONE; One tab PO daily for 5 days.  Dispense: 5 tablet; Refill: 0     Follow up plan: Return in about 2 weeks (around 12/11/2022) for COPD.  Park Meo, FNP

## 2022-11-27 NOTE — Assessment & Plan Note (Addendum)
mMRC 3 and CAT > 10, GOLD group B. She is currently using Ventolin and Advair. Will discontinue Advair and start Stiolto Respimat. Continue Ventolin PRN. Seek medical care if symptoms worsen or persist. Will also refer back to Pulm. Follow up in 1-2 weeks.

## 2022-12-06 ENCOUNTER — Ambulatory Visit: Payer: Medicaid Other | Admitting: Family Medicine

## 2022-12-06 ENCOUNTER — Ambulatory Visit: Payer: Self-pay | Admitting: Family Medicine

## 2022-12-06 ENCOUNTER — Encounter: Payer: Self-pay | Admitting: Family Medicine

## 2022-12-06 ENCOUNTER — Ambulatory Visit (HOSPITAL_COMMUNITY)
Admission: RE | Admit: 2022-12-06 | Discharge: 2022-12-06 | Disposition: A | Discharge status: Home / Self Care | Payer: Medicaid Other | Source: Ambulatory Visit | Attending: Family Medicine | Admitting: Family Medicine

## 2022-12-06 VITALS — BP 122/80 | HR 97 | Temp 98.5°F | Ht 59.0 in | Wt 146.0 lb

## 2022-12-06 DIAGNOSIS — R0602 Shortness of breath: Secondary | ICD-10-CM | POA: Insufficient documentation

## 2022-12-06 DIAGNOSIS — J069 Acute upper respiratory infection, unspecified: Secondary | ICD-10-CM

## 2022-12-06 DIAGNOSIS — R110 Nausea: Secondary | ICD-10-CM | POA: Diagnosis not present

## 2022-12-06 MED ORDER — ONDANSETRON HCL 4 MG PO TABS: 4.0000 mg | ORAL_TABLET | Freq: Three times a day (TID) | ORAL | 0 refills | Status: DC | PRN

## 2022-12-06 MED ORDER — BENZONATATE 200 MG PO CAPS
200.0000 mg | ORAL_CAPSULE | Freq: Two times a day (BID) | ORAL | 0 refills | Status: DC | PRN
Start: 2022-12-06 — End: 2023-01-02

## 2022-12-06 NOTE — Progress Notes (Signed)
Subjective:  HPI: Pamela Livingston is a 57 y.o. female presenting on 12/06/2022 for Acute Visit (Congested, nauseated started 2 days ago, hard to breath, coughing green mucous)   HPI Patient is in today for 2 days of nausea, malaise, shortness of breath with exertion, mucopurulent cough, chills, night sweats. She recently finished a 5 day course of prednisone for COPD exacerbation. Denies fever, vomiting, diarrhea, sinus congestion, rhinorrhea No known sick exposures, negative home covid test Has tried delsym  Review of Systems  All other systems reviewed and are negative.   Relevant past medical history reviewed and updated as indicated.   Past Medical History:  Diagnosis Date   Asthma    Chronic pelvic pain in female    COPD (chronic obstructive pulmonary disease) (HCC)    Endometriosis    Pneumonia 10/2021   Polysubstance abuse (HCC)    marijuana, cocaine, benzos   Stomach ulcer    Suicide attempt (HCC) 2008   by phenergan overdose   Syncope      Past Surgical History:  Procedure Laterality Date   ABDOMINAL HYSTERECTOMY     BALLOON DILATION N/A 07/26/2020   Procedure: BALLOON DILATION;  Surgeon: Lanelle Bal, DO;  [facility];  Service: Endoscopy;  Laterality: N/A;   BIOPSY  07/26/2020   Procedure: BIOPSY;  Surgeon: Lanelle Bal, DO;  [facility];  Service: Endoscopy;;   BIOPSY  11/14/2021   Procedure: BIOPSY;  Surgeon: Iva Boop, MD;  Location: [facility] ENDOSCOPY;  Service: Gastroenterology;;   CERVICAL CONE BIOPSY     cervical cancer   CESAREAN SECTION  1993   CHOLECYSTECTOMY     COLONOSCOPY WITH PROPOFOL N/A 07/26/2020   Procedure: COLONOSCOPY WITH PROPOFOL;  Surgeon: Lanelle Bal, DO;  [facility];  Service: Endoscopy;  Laterality: N/A;  12:00pm   ESOPHAGOGASTRODUODENOSCOPY (EGD) WITH PROPOFOL N/A 07/26/2020   Procedure: ESOPHAGOGASTRODUODENOSCOPY (EGD) WITH PROPOFOL;  Surgeon: Lanelle Bal, DO;  [facility];  Service: Endoscopy;  Laterality: N/A;   ESOPHAGOGASTRODUODENOSCOPY (EGD) WITH PROPOFOL N/A 11/14/2021   Procedure: ESOPHAGOGASTRODUODENOSCOPY (EGD) WITH PROPOFOL;  Surgeon: Iva Boop, MD;  Location: [facility] ENDOSCOPY;  Service: Gastroenterology;  Laterality: N/A;   RADICAL HYSTERECTOMY WITH TRANSPOSITION OF OVARIES     TOOTH EXTRACTION     TUBAL LIGATION  1993    Allergies and medications reviewed and updated.   Current Outpatient Medications:    acetaminophen (TYLENOL) 325 MG tablet, Take 3 tablets by mouth every 6 (six) hours as needed for moderate pain., Disp: , Rfl:    atorvastatin (LIPITOR) 40 MG tablet, Take 1 tablet (40 mg total) by mouth daily., Disp: 90 tablet, Rfl: 3   benzonatate (TESSALON) 200 MG capsule, Take 1 capsule (200 mg total) by mouth 2 (two) times daily as needed for cough., Disp: 20 capsule, Rfl: 0   cyclobenzaprine (FLEXERIL) 10 MG tablet, Take 1 tablet (10 mg total) by mouth 3 (three) times daily as needed for muscle spasms., Disp: 30 tablet, Rfl: 0   diphenhydrAMINE (BENADRYL) 25 mg capsule, Take 25 mg by mouth at bedtime as needed for sleep., Disp: , Rfl:    gabapentin (NEURONTIN) 400 MG capsule, Take 1 capsule (400 mg total) by mouth 3 (three) times daily., Disp: 90 capsule, Rfl: 1   lisinopril (ZESTRIL) 20 MG tablet, Take 1 tablet by mouth once daily, Disp: 90 tablet, Rfl: 0   Loratadine-Pseudoephedrine (CLARITIN-D 12 HOUR PO), Take 1 tablet by mouth daily.,  Disp: , Rfl:    montelukast (SINGULAIR) 10 MG tablet, TAKE 1 TABLET BY MOUTH ONCE DAILY AS NEEDED, Disp: 90 tablet, Rfl: 0   nitroGLYCERIN (NITROSTAT) 0.4 MG SL tablet, Place 1 tablet (0.4 mg total) under the tongue every 5 (five) minutes as needed for chest pain., Disp: 25 tablet, Rfl: prn   omeprazole (PRILOSEC) 40 MG capsule, TAKE 1 CAPSULE BY MOUTH IN THE MORNING AND AT BEDTIME, Disp: 90 capsule, Rfl: 0   ondansetron (ZOFRAN) 4 MG tablet, Take 1 tablet (4 mg total) by mouth every 8 (eight) hours as  needed for nausea or vomiting., Disp: 20 tablet, Rfl: 0   predniSONE (DELTASONE) 50 MG tablet, One tab PO daily for 5 days., Disp: 5 tablet, Rfl: 0   Tiotropium Bromide-Olodaterol (STIOLTO RESPIMAT) 2.5-2.5 MCG/ACT AERS, Inhale 2 puffs into the lungs daily., Disp: 1 each, Rfl: 11   VENTOLIN HFA 108 (90 Base) MCG/ACT inhaler, INHALE 2 PUFFS BY MOUTH EVERY 6 HOURS AS NEEDED FOR WHEEZING OR SHORTNESS OF BREATH, Disp: 18 g, Rfl: 0  Allergies  Allergen Reactions   Asa [Aspirin] Anaphylaxis, Hives and Swelling   Codeine Nausea Only    Objective:   BP 122/80   Pulse 97   Temp 98.5 F (36.9 C) (Oral)   Ht 4\' 11"  (1.499 m)   Wt 146 lb (66.2 kg)   SpO2 95%   BMI 29.49 kg/m      12/06/2022   10:56 AM 11/27/2022   10:28 AM 10/12/2022    4:01 PM  Vitals with BMI  Height 4\' 11"  4\' 11"  4\' 11"   Weight 146 lbs 147 lbs 145 lbs 3 oz  BMI 29.47 29.67 29.31  Systolic 122 128 147  Diastolic 80 84 64  Pulse 97 77 94     Physical Exam Vitals and nursing note reviewed.  Constitutional:      Appearance: Normal appearance. She is normal weight. She is ill-appearing.  HENT:     Head: Normocephalic and atraumatic.  Cardiovascular:     Rate and Rhythm: Normal rate and regular rhythm.     Pulses: Normal pulses.     Heart sounds: Normal heart sounds.  Pulmonary:     Effort: Pulmonary effort is normal.     Breath sounds: Rhonchi present.  Skin:    General: Skin is warm and dry.  Neurological:     General: No focal deficit present.     Mental Status: She is alert and oriented to person, place, and time. Mental status is at baseline.  Psychiatric:        Mood and Affect: Mood normal.        Behavior: Behavior normal.        Thought Content: Thought content normal.        Judgment: Judgment normal.     Assessment & Plan:  Nausea  Viral URI with cough Assessment & Plan: Patient is stable on exam but complains of worsening dyspnea and cough with exertion in setting of likely viral  illness. Coarse lung sounds on exam. Will obtain CXR. Start Tessalon 200mg  BID PRN for cough, zofran 4mg  q8h for nausea, and use Albuterol q4-6h for shortness of breath or wheezing (she was only using this twice a day). Encouraged to seek medical care if symptoms worsen or persist.  Orders: -     DG Chest 2 View; Future  Shortness of breath  Other orders -     Ondansetron HCl; Take 1 tablet (4 mg total) by mouth  every 8 (eight) hours as needed for nausea or vomiting.  Dispense: 20 tablet; Refill: 0 -     Benzonatate; Take 1 capsule (200 mg total) by mouth 2 (two) times daily as needed for cough.  Dispense: 20 capsule; Refill: 0     Follow up plan: Return if symptoms worsen or fail to improve.  Park Meo, FNP

## 2022-12-06 NOTE — Telephone Encounter (Signed)
Copied from CRM [id number]. Topic: Clinical - Red Word Triage >> Dec 06, 2022  8:17 AM Desma Mcgregor wrote: Red Word that prompted transfer to Nurse Triage: Difficulty breathing, nauseated, black around the eyes, congestion. Symptoms getting worse within the last two days.  Chief Complaint: Productive Cough Symptoms: Productive cough, body aches, and gets winded when walking Frequency: Constant Pertinent Negatives: Patient denies fever Disposition: [] ED /[] Urgent Care (no appt availability in office) / [x] Appointment(In office/virtual)/ []  [facility]/ [] Home Care/ [] Refused Recommended Disposition /[] [facility]/ []  Follow-up with PCP Additional Notes: Pt sts that she was recently prescribed prednisone, but sts that her cough has gotten worse   Reason for Disposition  [1] Known COPD or other severe lung disease (i.e., bronchiectasis, cystic fibrosis, lung surgery) AND [2] worsening symptoms (i.e., increased sputum purulence or amount, increased breathing difficulty  Answer Assessment - Initial Assessment Questions 1. ONSET: "When did the cough begin?"      States that she has alwas had a cough 2. SEVERITY: "How bad is the cough today?"      States that she feels bad all over 3. SPUTUM: "Describe the color of your sputum" (none, dry cough; clear, white, yellow, green)     Greenish sputum 4. HEMOPTYSIS: "Are you coughing up any blood?" If so ask: "How much?" (flecks, streaks, tablespoons, etc.)     No 5. DIFFICULTY BREATHING: "Are you having difficulty breathing?" If Yes, ask: "How bad is it?" (e.g., mild, moderate, severe)    - MILD: No SOB at rest, mild SOB with walking, speaks normally in sentences, can lie down, no retractions, pulse < 100.    - MODERATE: SOB at rest, SOB with minimal exertion and prefers to sit, cannot lie down flat, speaks in phrases, mild retractions, audible wheezing, pulse 100-120.    - SEVERE: Very SOB at rest, speaks in single words, struggling to  breathe, sitting hunched forward, retractions, pulse > 120      Mild, short of breath when walking or doing anything 6. FEVER: "Do you have a fever?" If Yes, ask: "What is your temperature, how was it measured, and when did it start?"     No 7. CARDIAC HISTORY: "Do you have any history of heart disease?" (e.g., heart attack, congestive heart failure)      No 8. LUNG HISTORY: "Do you have any history of lung disease?"  (e.g., pulmonary embolus, asthma, emphysema)     COPD 9. PE RISK FACTORS: "Do you have a history of blood clots?" (or: recent major surgery, recent prolonged travel, bedridden)     No 10. OTHER SYMPTOMS: "Do you have any other symptoms?" (e.g., runny nose, wheezing, chest pain)       Nasal congestion, cough, and body aches 11. PREGNANCY: "Is there any chance you are pregnant?" "When was your last menstrual period?"       No 12. TRAVEL: "Have you traveled out of the country in the last month?" (e.g., travel history, exposures)       No  Protocols used: Cough - Acute Productive-A-AH

## 2022-12-06 NOTE — Assessment & Plan Note (Deleted)
Patient is stable on exam but complains of worsening dyspnea with exertion in setting of likely viral illness. Coarse lung sounds on exam. Will obtain CXR.

## 2022-12-06 NOTE — Assessment & Plan Note (Signed)
Patient is stable on exam but complains of worsening dyspnea and cough with exertion in setting of likely viral illness. Coarse lung sounds on exam. Will obtain CXR. Start Tessalon 200mg  BID PRN for cough, zofran 4mg  q8h for nausea, and use Albuterol q4-6h for shortness of breath or wheezing (she was only using this twice a day). Encouraged to seek medical care if symptoms worsen or persist.

## 2023-01-01 NOTE — Progress Notes (Unsigned)
Pamela Livingston, female    DOB: May 28, 1965    MRN: [id number]   Brief patient profile:  57  yowf active smoker   [name] pt self -referred to pulmonary clinic in [place]  01/02/2023  for cough p pna  but doe x 2022 with nl pfts x for low ERV in 02/2022     History of Present Illness  01/02/2023  Pulmonary/ 1st office eval/ Sanam Marmo / [name] on Stiolto/ ACEi  Chief Complaint  Patient presents with   Establish Care   COPD   Shortness of Breath  Dyspnea:   struggles to do grocery store /sev years since can do mb back to house  Cough: harsh / worse at hs thick white  Sleep: bed is flat/ sleeps 2 pillows freq wakening with cough and gets in recliner helps with choking sensation  SABA use: every 6 h hfa and neb  02: none  Prednisone helped some   No obvious other  patterns in day to day or daytime variability or assoc   purulent sputum or mucus plugs or hemoptysis or cp or chest tightness, subjective wheeze or overt sinus or hb symptoms.    Also denies any obvious fluctuation of symptoms with weather or environmental changes or other aggravating or alleviating factors except as outlined above   No unusual exposure hx or h/o childhood pna/ asthma or knowledge of premature birth.  Current Allergies, Complete Past Medical History, Past Surgical History, Family History, and Social History were reviewed in [employer] record.  ROS  The following are not active complaints unless bolded Hoarseness, sore throat= globus, dysphagia, dental problems, itching, sneezing,  nasal congestion or discharge of excess mucus or purulent secretions, ear ache,   fever, chills, sweats, unintended wt loss or wt gain, classically pleuritic or exertional cp,  orthopnea pnd or arm/hand swelling  or leg swelling, presyncope, palpitations, abdominal pain, anorexia, nausea, vomiting, diarrhea  or change in bowel habits or change in bladder habits, change in stools or change in urine, dysuria,  hematuria,  rash, arthralgias, visual complaints, headache, numbness, weakness or ataxia or problems with walking or coordination,  change in mood or  memory.              Outpatient Medications Prior to Visit  Medication Sig Dispense Refill   acetaminophen (TYLENOL) 325 MG tablet Take 3 tablets by mouth every 6 (six) hours as needed for moderate pain.     atorvastatin (LIPITOR) 40 MG tablet Take 1 tablet (40 mg total) by mouth daily. 90 tablet 3   benzonatate (TESSALON) 200 MG capsule Take 1 capsule (200 mg total) by mouth 2 (two) times daily as needed for cough. 20 capsule 0   cyclobenzaprine (FLEXERIL) 10 MG tablet Take 1 tablet (10 mg total) by mouth 3 (three) times daily as needed for muscle spasms. 30 tablet 0   diphenhydrAMINE (BENADRYL) 25 mg capsule Take 25 mg by mouth at bedtime as needed for sleep.     gabapentin (NEURONTIN) 400 MG capsule Take 1 capsule (400 mg total) by mouth 3 (three) times daily. 90 capsule 1   lisinopril (ZESTRIL) 20 MG tablet Take 1 tablet by mouth once daily 90 tablet 0   Loratadine-Pseudoephedrine (CLARITIN-D 12 HOUR PO) Take 1 tablet by mouth daily.     montelukast (SINGULAIR) 10 MG tablet TAKE 1 TABLET BY MOUTH ONCE DAILY AS NEEDED 90 tablet 0   nitroGLYCERIN (NITROSTAT) 0.4 MG SL tablet Place 1 tablet (0.4 mg total)  under the tongue every 5 (five) minutes as needed for chest pain. 25 tablet prn   omeprazole (PRILOSEC) 40 MG capsule TAKE 1 CAPSULE BY MOUTH IN THE MORNING AND AT BEDTIME 90 capsule 0   ondansetron (ZOFRAN) 4 MG tablet Take 1 tablet (4 mg total) by mouth every 8 (eight) hours as needed for nausea or vomiting. 20 tablet 0   predniSONE (DELTASONE) 50 MG tablet One tab PO daily for 5 days. 5 tablet 0   Tiotropium Bromide-Olodaterol (STIOLTO RESPIMAT) 2.5-2.5 MCG/ACT AERS Inhale 2 puffs into the lungs daily. 1 each 11   VENTOLIN HFA 108 (90 Base) MCG/ACT inhaler INHALE 2 PUFFS BY MOUTH EVERY 6 HOURS AS NEEDED FOR WHEEZING OR SHORTNESS OF BREATH 18  g 0   No facility-administered medications prior to visit.    Past Medical History:  Diagnosis Date   Asthma    Chronic pelvic pain in female    COPD (chronic obstructive pulmonary disease) (HCC)    Endometriosis    Pneumonia 10/2021   Polysubstance abuse (HCC)    marijuana, cocaine, benzos   Stomach ulcer    Suicide attempt (HCC) 2008   by phenergan overdose   Syncope       Objective:     BP 108/73   Pulse 87   Ht 4\' 11"  (1.499 m)   Wt 142 lb (64.4 kg)   SpO2 94%   BMI 28.68 kg/m   SpO2: 94 % RA amb wf with cough heard across the office with heavy door closed    HEENT : Oropharynx  clear     Nasal turbinates nl   NECK :  without  apparent JVD/ palpable Nodes/TM  - very prominent pseudowheeze   LUNGS: no acc muscle use,  Nl contour chest which is clear to A and P bilaterally without cough on insp or exp maneuvers   CV:  RRR  no s3 or murmur or increase in P2, and no edema   ABD:  soft and nontender    MS:  Nl gait/ ext warm without deformities Or obvious joint restrictions  calf tenderness, cyanosis or clubbing    SKIN: warm and dry without lesions    NEURO:  alert, approp, nl sensorium with  no motor or cerebellar deficits apparent.       I personally reviewed images and agree with radiology impression as follows:  CXR:   pa and lateral 12/06/22 1. Mild bronchitic changes. 2. COPD  Assessment   Upper airway cough syndrome Onset 2022 with nl pfts 02/2020 prev dx as copd/ AB - 01/02/2023  After extensive coaching inhaler device,  effectiveness =    75% with SMI/ hfa   >>>>  d/c ace 01/02/2023 with max gerd rx and mucinex dm and depomedrol 120 mg IM and f/u in 4 weeks, sooner if needed   Comment  Upper airway cough syndrome (previously labeled PNDS),  is so named because it's frequently impossible to sort out how much is  CR/sinusitis with freq throat clearing (which can be related to primary GERD)   vs  causing  secondary (" extra esophageal")  GERD  from wide swings in gastric pressure that occur with throat clearing, often  promoting self use of mint and menthol lozenges that reduce the lower esophageal sphincter tone and exacerbate the problem further in a cyclical fashion.   These are the same pts (now being labeled as having "irritable larynx syndrome" by some cough centers) who not infrequently have a history of  having failed to tolerate ace inhibitors,  dry powder inhalers or biphosphonates or report having atypical/extraesophageal reflux= LPR/globus symptoms that don't respond to standard doses of PPI  and are easily confused as having aecopd or asthma flares by even experienced allergists/ pulmonologists (myself included).   Recs as above   Essential hypertension D/c acei 01/02/2023 due to violent upper airway cough/ pseudowheeze   Comment: In the best review of chronic cough to date ( NEJM 2016 375 8469-6295) ,  ACEi are now felt to cause cough in up to  20% of pts which is a 4 fold increase from previous reports and does not include the variety of non-specific complaints we see in pulmonary clinic in pts on ACEi but previously attributed to another dx like  Copd/asthma and  include PNDS , throat congestion/globus , "bronchitis", unexplained dyspnea and noct "strangling"  or choking sensations, and hoarseness, but also  atypical /refractory GERD symptoms like dysphagia and "bad heartburn"   The only way I know  to prove this is not an "ACEi Case" is a trial off ACEi x a minimum of 4 weeks then regroup.   >>> rx valsartan 160 mg one daily   Cigarette smoker Counseled re importance of smoking cessation but did not meet time criteria for separate billing    Low-dose CT lung cancer screening is recommended for patients who are 14-21 years of age with a 20+ pack-year history of smoking and who are currently smoking or quit <=15 years ago. No coughing up blood  No unintentional weight loss of > 15 pounds in the last 6 months - pt is  eligible for scanning yearly until 15 years from day she truly quits > due 06/22/2023  / advised we can do this thru the [facility] program or it can be done as part of an annual eval by PCP   Each maintenance medication was reviewed in detail including emphasizing most importantly the difference between maintenance and prns and under what circumstances the prns are to be triggered using an action plan format where appropriate.  Total time for H and P, chart review, counseling, reviewing hfa/smi  device(s) and generating customized AVS unique to this office visit / same day charting = 45 min new pt eval           Sandrea Hughs, MD 01/02/2023

## 2023-01-02 ENCOUNTER — Encounter: Payer: Self-pay | Admitting: Internal Medicine

## 2023-01-02 ENCOUNTER — Ambulatory Visit: Payer: Medicaid Other | Admitting: Internal Medicine

## 2023-01-02 VITALS — BP 108/73 | HR 87 | Ht 59.0 in | Wt 142.0 lb

## 2023-01-02 DIAGNOSIS — R051 Acute cough: Secondary | ICD-10-CM

## 2023-01-02 DIAGNOSIS — I10 Essential (primary) hypertension: Secondary | ICD-10-CM | POA: Diagnosis not present

## 2023-01-02 DIAGNOSIS — F17210 Nicotine dependence, cigarettes, uncomplicated: Secondary | ICD-10-CM

## 2023-01-02 DIAGNOSIS — R058 Other specified cough: Secondary | ICD-10-CM | POA: Insufficient documentation

## 2023-01-02 MED ORDER — VALSARTAN 160 MG PO TABS: 160.0000 mg | ORAL_TABLET | Freq: Every day | ORAL | 11 refills | Status: DC

## 2023-01-02 MED ORDER — METHYLPREDNISOLONE ACETATE 80 MG/ML IJ SUSP
120.0000 mg | Freq: Once | INTRAMUSCULAR | Status: AC
  Administered 2023-01-02: 120 mg via INTRAMUSCULAR

## 2023-01-02 NOTE — Assessment & Plan Note (Addendum)
Counseled re importance of smoking cessation but did not meet time criteria for separate billing    Low-dose CT lung cancer screening is recommended for patients who are 21-55 years of age with a 20+ pack-year history of smoking and who are currently smoking or quit <=15 years ago. No coughing up blood  No unintentional weight loss of > 15 pounds in the last 6 months - pt is eligible for scanning yearly until 15 years from day she truly quits > due 06/22/2023  / advised we can do this thru the [facility] program or it can be done as part of an annual eval by PCP   Each maintenance medication was reviewed in detail including emphasizing most importantly the difference between maintenance and prns and under what circumstances the prns are to be triggered using an action plan format where appropriate.  Total time for H and P, chart review, counseling, reviewing hfa/smi  device(s) and generating customized AVS unique to this office visit / same day charting = 45 min new pt eval

## 2023-01-02 NOTE — Assessment & Plan Note (Signed)
Onset 2022 with nl pfts 02/2020 prev dx as copd/ AB - 01/02/2023  After extensive coaching inhaler device,  effectiveness =    75% with SMI/ hfa   >>>>  d/c ace 01/02/2023 with max gerd rx and mucinex dm and depomedrol 120 mg IM and f/u in 4 weeks, sooner if needed   Comment  Upper airway cough syndrome (previously labeled PNDS),  is so named because it's frequently impossible to sort out how much is  CR/sinusitis with freq throat clearing (which can be related to primary GERD)   vs  causing  secondary (" extra esophageal")  GERD from wide swings in gastric pressure that occur with throat clearing, often  promoting self use of mint and menthol lozenges that reduce the lower esophageal sphincter tone and exacerbate the problem further in a cyclical fashion.   These are the same pts (now being labeled as having "irritable larynx syndrome" by some cough centers) who not infrequently have a history of having failed to tolerate ace inhibitors,  dry powder inhalers or biphosphonates or report having atypical/extraesophageal reflux= LPR/globus symptoms that don't respond to standard doses of PPI  and are easily confused as having aecopd or asthma flares by even experienced allergists/ pulmonologists (myself included).   Recs as above

## 2023-01-02 NOTE — Assessment & Plan Note (Signed)
D/c acei 01/02/2023 due to violent upper airway cough/ pseudowheeze   Comment: In the best review of chronic cough to date ( NEJM 2016 375 [id number]) ,  ACEi are now felt to cause cough in up to  20% of pts which is a 4 fold increase from previous reports and does not include the variety of non-specific complaints we see in pulmonary clinic in pts on ACEi but previously attributed to another dx like  Copd/asthma and  include PNDS , throat congestion/globus , "bronchitis", unexplained dyspnea and noct "strangling"  or choking sensations, and hoarseness, but also  atypical /refractory GERD symptoms like dysphagia and "bad heartburn"   The only way I know  to prove this is not an "ACEi Case" is a trial off ACEi x a minimum of 4 weeks then regroup.   >>> rx valsartan 160 mg one daily

## 2023-01-02 NOTE — Patient Instructions (Addendum)
Stop lisinopril today and replace with valsartan 160 mg one daily in its place  Omeprazole Take 30- 60 min before your first and last meals of the day   Stop the Delsym and replace with mucinex dm 1200 mg every 12 hours as needed   Only use your albuterol as a rescue medication to be used if you can't catch your breath by    Plan A = Automatic = Always=    Stiolto 2 puffs each am   Work on inhaler technique:  relax and gently blow all the way out then take a nice smooth full deep breath back in, triggering the inhaler at same time you start breathing in.  Hold breath in for at least  5 seconds if you can      Plan B = Backup (to supplement plan A, not to replace it) Only use your albuterol inhaler as a rescue medication to be used if you can't catch your breath by resting or doing a relaxed purse lip breathing pattern.  - The less you use it, the better it will work when you need it. - Ok to use the inhaler up to 2 puffs  every 4 hours if you must but call for appointment if use goes up over your usual need - Don't leave home without it !!  (think of it like the spare tire for your car)   Plan C = Crisis (instead of Plan B but only if Plan B stops working) - only use your albuterol nebulizer if you first try Plan B and it fails to help > ok to use the nebulizer up to every 4 hours but if start needing it regularly call for immediate appointment    Depomedrol 120 mg IM   Please schedule a follow up office visit in 4 weeks, sooner if needed  with all medications /inhalers/ solutions in hand so we can verify exactly what you are taking. This includes all medications from all doctors and over the counters

## 2023-01-08 ENCOUNTER — Telehealth: Payer: Self-pay | Admitting: Internal Medicine

## 2023-01-08 NOTE — Telephone Encounter (Signed)
Spoke with patient regarding the Thursday 01/31/23 appointment with Dr. Verlon Setting will be closed --patient rescheduled to Monday 02/04/23 at 8:45 am.  Will mail information to patient and she voiced her understanding

## 2023-01-21 ENCOUNTER — Other Ambulatory Visit: Payer: Self-pay | Admitting: Family Medicine

## 2023-01-31 ENCOUNTER — Ambulatory Visit: Payer: Medicaid Other | Admitting: Internal Medicine

## 2023-02-03 NOTE — Progress Notes (Deleted)
 Pamela Livingston, female    DOB: 1965/08/08    MRN: [id number]   Brief patient profile:  10  yowf active smoker   Jude pt self -referred to pulmonary clinic in [place]  01/02/2023  for cough p pna  but doe x 2022 with nl pfts x for low ERV in 02/2022     History of Present Illness  01/02/2023  Pulmonary/ 1st office eval/ Yonael Tulloch / [facility] on Stiolto/ ACEi  Chief Complaint  Patient presents with   Establish Care   COPD   Shortness of Breath  Dyspnea:   struggles to do grocery store /sev years since can do mb back to house  Cough: harsh / worse at hs thick white  Sleep: bed is flat/ sleeps 2 pillows freq wakening with cough and gets in recliner helps with choking sensation  SABA use: every 6 h hfa and neb  02: none  Prednisone  helped some  Rec Stop lisinopril  today and replace with valsartan  160 mg one daily in its place Omeprazole  Take 30- 60 min before your first and last meals of the day  Stop the Delsym  and replace with mucinex  dm 1200 mg every 12 hours as needed  Only use your albuterol  as a rescue medication to be used if you can't catch your breath by  Plan A = Automatic = Always=    Stiolto 2 puffs each am  Work on inhaler technique:  r  Plan B = Backup (to supplement plan A, not to replace it) Only use your albuterol  inhaler as a rescue medication  Plan C = Crisis (instead of Plan B but only if Plan B stops working) - only use your albuterol  nebulizer if you first try Plan B  Depomedrol 120 mg IM  Please schedule a follow up office visit in 4 weeks, sooner if needed  with all medications /inhalers/ solutions in hand    02/04/2023  f/u ov/[place] office/Garyn Waguespack re: *** maint on ***  did *** bring meds  No chief complaint on file.   Dyspnea:  *** Cough: *** Sleeping: ***   resp cc  SABA use: *** 02: ***  Lung cancer screening: ***   No obvious day to day or daytime variability or assoc excess/ purulent sputum or mucus plugs or hemoptysis or cp or chest  tightness, subjective wheeze or overt sinus or hb symptoms.    Also denies any obvious fluctuation of symptoms with weather or environmental changes or other aggravating or alleviating factors except as outlined above   No unusual exposure hx or h/o childhood pna/ asthma or knowledge of premature birth.  Current Allergies, Complete Past Medical History, Past Surgical History, Family History, and Social History were reviewed in [employer] record.  ROS  The following are not active complaints unless bolded Hoarseness, sore throat, dysphagia, dental problems, itching, sneezing,  nasal congestion or discharge of excess mucus or purulent secretions, ear ache,   fever, chills, sweats, unintended wt loss or wt gain, classically pleuritic or exertional cp,  orthopnea pnd or arm/hand swelling  or leg swelling, presyncope, palpitations, abdominal pain, anorexia, nausea, vomiting, diarrhea  or change in bowel habits or change in bladder habits, change in stools or change in urine, dysuria, hematuria,  rash, arthralgias, visual complaints, headache, numbness, weakness or ataxia or problems with walking or coordination,  change in mood or  memory.        No outpatient medications have been marked as taking for the 02/04/23 encounter (  Appointment) with Darlean Ozell NOVAK, MD.          Past Medical History:  Diagnosis Date   Asthma    Chronic pelvic pain in female    COPD (chronic obstructive pulmonary disease) (HCC)    Endometriosis    Pneumonia 10/2021   Polysubstance abuse (HCC)    marijuana, cocaine, benzos   Stomach ulcer    Suicide attempt (HCC) 2008   by phenergan  overdose   Syncope       Objective:     02/04/2023        ***   01/02/23 142 lb (64.4 kg)  12/06/22 146 lb (66.2 kg)  11/27/22 147 lb (66.7 kg)    Vital signs reviewed  02/04/2023  - Note at rest 02 sats  ***% on ***   General appearance:    ***          I personally reviewed images and agree with  radiology impression as follows:  CXR:   pa and lateral 12/06/22 1. Mild bronchitic changes. 2. COPD  Assessment

## 2023-02-04 ENCOUNTER — Encounter: Payer: Self-pay | Admitting: Internal Medicine

## 2023-02-04 ENCOUNTER — Ambulatory Visit: Payer: Medicaid Other | Admitting: Internal Medicine

## 2023-02-11 ENCOUNTER — Ambulatory Visit: Payer: Medicaid Other | Admitting: Family Medicine

## 2023-02-14 ENCOUNTER — Ambulatory Visit: Payer: Medicaid Other | Admitting: Family Medicine

## 2023-02-14 ENCOUNTER — Encounter: Payer: Self-pay | Admitting: Family Medicine

## 2023-02-14 VITALS — BP 122/80 | HR 67 | Temp 97.6°F | Ht 59.0 in | Wt 142.0 lb

## 2023-02-14 DIAGNOSIS — Z23 Encounter for immunization: Secondary | ICD-10-CM

## 2023-02-14 DIAGNOSIS — F17219 Nicotine dependence, cigarettes, with unspecified nicotine-induced disorders: Secondary | ICD-10-CM

## 2023-02-14 DIAGNOSIS — I10 Essential (primary) hypertension: Secondary | ICD-10-CM | POA: Diagnosis not present

## 2023-02-14 DIAGNOSIS — R058 Other specified cough: Secondary | ICD-10-CM | POA: Diagnosis not present

## 2023-02-14 DIAGNOSIS — E785 Hyperlipidemia, unspecified: Secondary | ICD-10-CM | POA: Diagnosis not present

## 2023-02-14 MED ORDER — NICOTINE 7 MG/24HR TD PT24: 7.0000 mg | MEDICATED_PATCH | Freq: Every day | TRANSDERMAL | 1 refills | Status: DC

## 2023-02-14 NOTE — Progress Notes (Signed)
Subjective:  HPI: Pamela Livingston is a 58 y.o. female presenting on 02/14/2023 for No chief complaint on file.   HPI Patient is in today for chronic condition follow up for HLD, HTN, and COPD. She will have to return for fasting labs. Has established with pulm since last seeing me, see note below.   HYPERTENSION without Chronic Kidney Disease Hypertension status: controlled  Satisfied with current treatment? yes Duration of hypertension: chronic BP monitoring frequency:  a few times a week BP range: 120/70-80s BP medication side effects:  no Medication compliance: excellent compliance Previous BP meds:lisinopril (DC by pulm), valsartan Aspirin: no Recurrent headaches: no Visual changes: no Palpitations: no Dyspnea: no Chest pain: no Lower extremity edema: no Dizzy/lightheaded: no  HYPERLIPIDEMIA Hyperlipidemia status: excellent compliance Satisfied with current treatment?  yes Side effects:  no Medication compliance: excellent compliance Past cholesterol meds: atorvastain (lipitor) Supplements: none Aspirin:  no The 10-year ASCVD risk score (Arnett DK, et al., 2019) is: 5.7%   Values used to calculate the score:     Age: 34 years     Sex: Female     Is Non-Hispanic African American: No     Diabetic: No     Tobacco smoker: Yes     Systolic Blood Pressure: 122 mmHg     Is BP treated: Yes     HDL Cholesterol: 40 mg/dL     Total Cholesterol: 146 mg/dL Chest pain:  no Coronary artery disease:  no Family history CAD:  no Family history early CAD:  no   Per Pulm OV 01/02/2023 for reference only: Upper airway cough syndrome Onset 2022 with nl pfts 02/2020 prev dx as copd/ AB - 01/02/2023  After extensive coaching inhaler device,  effectiveness =    75% with SMI/ hfa   >>>>  d/c ace 01/02/2023 with max gerd rx and mucinex dm and depomedrol 120 mg IM and f/u in 4 weeks, sooner if needed  Essential hypertension D/c acei 01/02/2023 due to violent upper airway cough/  pseudowheeze  Cigarette smoker Counseled re importance of smoking cessation but did not meet time criteria for separate billing    Review of Systems  All other systems reviewed and are negative.   Relevant past medical history reviewed and updated as indicated.   Past Medical History:  Diagnosis Date   Asthma    Chronic pelvic pain in female    COPD (chronic obstructive pulmonary disease) (HCC)    Endometriosis    Pneumonia 10/2021   Polysubstance abuse (HCC)    marijuana, cocaine, benzos   Stomach ulcer    Suicide attempt (HCC) 2008   by phenergan overdose   Syncope      Past Surgical History:  Procedure Laterality Date   ABDOMINAL HYSTERECTOMY     BALLOON DILATION N/A 07/26/2020   Procedure: BALLOON DILATION;  Surgeon: Lanelle Bal, DO;  Location: [facility];  Service: Endoscopy;  Laterality: N/A;   BIOPSY  07/26/2020   Procedure: BIOPSY;  Surgeon: Lanelle Bal, DO;  Location: [facility];  Service: Endoscopy;;   BIOPSY  11/14/2021   Procedure: BIOPSY;  Surgeon: Iva Boop, MD;  Location: [facility] ENDOSCOPY;  Service: Gastroenterology;;   CERVICAL CONE BIOPSY     cervical cancer   CESAREAN SECTION  1993   CHOLECYSTECTOMY     COLONOSCOPY WITH PROPOFOL N/A 07/26/2020   Procedure: COLONOSCOPY WITH PROPOFOL;  Surgeon: Lanelle Bal, DO;  Location: [facility];  Service: Endoscopy;  Laterality:  N/A;  12:00pm   ESOPHAGOGASTRODUODENOSCOPY (EGD) WITH PROPOFOL N/A 07/26/2020   Procedure: ESOPHAGOGASTRODUODENOSCOPY (EGD) WITH PROPOFOL;  Surgeon: Lanelle Bal, DO;  Location: [facility];  Service: Endoscopy;  Laterality: N/A;   ESOPHAGOGASTRODUODENOSCOPY (EGD) WITH PROPOFOL N/A 11/14/2021   Procedure: ESOPHAGOGASTRODUODENOSCOPY (EGD) WITH PROPOFOL;  Surgeon: Iva Boop, MD;  Location: [facility] ENDOSCOPY;  Service: Gastroenterology;  Laterality: N/A;   RADICAL HYSTERECTOMY WITH TRANSPOSITION OF OVARIES     TOOTH EXTRACTION     TUBAL LIGATION  1993    Allergies  and medications reviewed and updated.   Current Outpatient Medications:    acetaminophen (TYLENOL) 325 MG tablet, Take 3 tablets by mouth every 6 (six) hours as needed for moderate pain., Disp: , Rfl:    atorvastatin (LIPITOR) 40 MG tablet, Take 1 tablet (40 mg total) by mouth daily., Disp: 90 tablet, Rfl: 3   diphenhydrAMINE (BENADRYL) 25 mg capsule, Take 25 mg by mouth at bedtime as needed for sleep., Disp: , Rfl:    gabapentin (NEURONTIN) 400 MG capsule, TAKE 1 CAPSULE BY MOUTH THREE TIMES DAILY, Disp: 90 capsule, Rfl: 0   Loratadine-Pseudoephedrine (CLARITIN-D 12 HOUR PO), Take 1 tablet by mouth daily., Disp: , Rfl:    montelukast (SINGULAIR) 10 MG tablet, TAKE 1 TABLET BY MOUTH ONCE DAILY AS NEEDED, Disp: 90 tablet, Rfl: 0   nicotine (NICODERM CQ - DOSED IN MG/24 HR) 7 mg/24hr patch, Place 1 patch (7 mg total) onto the skin daily., Disp: 30 patch, Rfl: 1   nitroGLYCERIN (NITROSTAT) 0.4 MG SL tablet, Place 1 tablet (0.4 mg total) under the tongue every 5 (five) minutes as needed for chest pain., Disp: 25 tablet, Rfl: prn   omeprazole (PRILOSEC) 40 MG capsule, TAKE 1 CAPSULE BY MOUTH IN THE MORNING AND AT BEDTIME, Disp: 90 capsule, Rfl: 0   ondansetron (ZOFRAN) 4 MG tablet, Take 1 tablet (4 mg total) by mouth every 8 (eight) hours as needed for nausea or vomiting., Disp: 20 tablet, Rfl: 0   Tiotropium Bromide-Olodaterol (STIOLTO RESPIMAT) 2.5-2.5 MCG/ACT AERS, Inhale 2 puffs into the lungs daily., Disp: 1 each, Rfl: 11   valsartan (DIOVAN) 160 MG tablet, Take 1 tablet (160 mg total) by mouth daily., Disp: 30 tablet, Rfl: 11   VENTOLIN HFA 108 (90 Base) MCG/ACT inhaler, INHALE 2 PUFFS BY MOUTH EVERY 6 HOURS AS NEEDED FOR WHEEZING OR SHORTNESS OF BREATH, Disp: 18 g, Rfl: 0  Allergies  Allergen Reactions   Asa [Aspirin] Anaphylaxis, Hives and Swelling   Codeine Nausea Only    Objective:   BP 122/80   Pulse 67   Temp 97.6 F (36.4 C) (Oral)   Ht 4\' 11"  (1.499 m)   Wt 142 lb (64.4 kg)    SpO2 100%   BMI 28.68 kg/m      02/14/2023   10:32 AM 01/02/2023    9:17 AM 12/06/2022   10:56 AM  Vitals with BMI  Height 4\' 11"  4\' 11"  4\' 11"   Weight 142 lbs 142 lbs 146 lbs  BMI 28.67 28.67 29.47  Systolic 122 108 096  Diastolic 80 73 80  Pulse 67 87 97     Physical Exam Vitals and nursing note reviewed.  Constitutional:      Appearance: Normal appearance. She is normal weight.  HENT:     Head: Normocephalic and atraumatic.  Cardiovascular:     Rate and Rhythm: Normal rate and regular rhythm.     Pulses: Normal pulses.     Heart sounds: Normal  heart sounds.  Pulmonary:     Effort: Pulmonary effort is normal.     Breath sounds: Normal breath sounds.  Skin:    General: Skin is warm and dry.  Neurological:     General: No focal deficit present.     Mental Status: She is alert and oriented to person, place, and time. Mental status is at baseline.  Psychiatric:        Mood and Affect: Mood normal.        Behavior: Behavior normal.        Thought Content: Thought content normal.        Judgment: Judgment normal.     Assessment & Plan:  Primary hypertension Assessment & Plan: Chronic well controlled. Continue Valsartan 160mg  daily. Recommend heart healthy diet such as Mediterranean diet with whole grains, fruits, vegetable, fish, lean meats, nuts, and olive oil. Limit salt. Encouraged moderate walking, 3-5 times/week for 30-50 minutes each session. Aim for at least 150 minutes.week. Goal should be pace of 3 miles/hours, or walking 1.5 miles in 30 minutes. Avoid tobacco products. Avoid excess alcohol. Take medications as prescribed and bring medications and blood pressure log with cuff to each office visit. Seek medical care for chest pain, palpitations, shortness of breath with exertion, dizziness/lightheadedness, vision changes, recurrent headaches, or swelling of extremities. Follow up in 6 months   Orders: -     CBC with Differential/Platelet -     COMPLETE  METABOLIC PANEL WITH GFR -     Lipid panel  Need for vaccination -     Digital Screening Mammogram, Left and Right; Future -     Pneumococcal conjugate vaccine 20-valent  Hyperlipidemia, unspecified hyperlipidemia type Assessment & Plan: Fasting labs ordered. Continue atorvastatin. I recommend consuming a heart healthy diet such as Mediterranean diet or DASH diet with whole grains, fruits, vegetable, fish, lean meats, nuts, and olive oil. Limit sweets and processed foods. I also encourage moderate intensity exercise 150 minutes weekly. This is 3-5 times weekly for 30-50 minutes each session. Goal should be pace of 3 miles/hours, or walking 1.5 miles in 30 minutes.  Orders: -     CBC with Differential/Platelet -     COMPLETE METABOLIC PANEL WITH GFR -     Lipid panel  Cigarette nicotine dependence with nicotine-induced disorder Assessment & Plan: 3-5 minute discussion regarding the harms of tobacco use, the benefits of cessation, and methods of cessation. Discussed that there are medication options to help with cessation. Provided printed education on steps to quit smoking. Patient is willing to try a medication to help. Start Nicotine low dose per pt request. If tolerated and ineffective will increase.    Upper airway cough syndrome  Other orders -     Nicotine; Place 1 patch (7 mg total) onto the skin daily.  Dispense: 30 patch; Refill: 1     Follow up plan: Return in about 6 months (around 08/14/2023) for chronic follow-up with labs 1 week prior.  Park Meo, FNP

## 2023-02-14 NOTE — Assessment & Plan Note (Addendum)
Chronic well controlled. Continue Valsartan 160mg  daily. Recommend heart healthy diet such as Mediterranean diet with whole grains, fruits, vegetable, fish, lean meats, nuts, and olive oil. Limit salt. Encouraged moderate walking, 3-5 times/week for 30-50 minutes each session. Aim for at least 150 minutes.week. Goal should be pace of 3 miles/hours, or walking 1.5 miles in 30 minutes. Avoid tobacco products. Avoid excess alcohol. Take medications as prescribed and bring medications and blood pressure log with cuff to each office visit. Seek medical care for chest pain, palpitations, shortness of breath with exertion, dizziness/lightheadedness, vision changes, recurrent headaches, or swelling of extremities. Follow up in 6 months

## 2023-02-14 NOTE — Assessment & Plan Note (Signed)
Fasting labs ordered. Continue atorvastatin. I recommend consuming a heart healthy diet such as Mediterranean diet or DASH diet with whole grains, fruits, vegetable, fish, lean meats, nuts, and olive oil. Limit sweets and processed foods. I also encourage moderate intensity exercise 150 minutes weekly. This is 3-5 times weekly for 30-50 minutes each session. Goal should be pace of 3 miles/hours, or walking 1.5 miles in 30 minutes.

## 2023-02-14 NOTE — Assessment & Plan Note (Signed)
3-5 minute discussion regarding the harms of tobacco use, the benefits of cessation, and methods of cessation. Discussed that there are medication options to help with cessation. Provided printed education on steps to quit smoking. Patient is willing to try a medication to help. Start Nicotine low dose per pt request. If tolerated and ineffective will increase.

## 2023-02-14 NOTE — Assessment & Plan Note (Signed)
ACEi DCd, continue Stiolto and PRN Albuterol, counseled on smoking cessation and she will try nicotine patches  Per Pulm: Onset 2022 with nl pfts 02/2020 prev dx as copd/ AB - 01/02/2023  After extensive coaching inhaler device,  effectiveness =    75% with SMI/ hfa   >>>>  d/c ace 01/02/2023 with max gerd rx and mucinex dm and depomedrol 120 mg IM and f/u in 4 weeks, sooner if needed

## 2023-02-15 ENCOUNTER — Other Ambulatory Visit: Payer: Medicaid Other

## 2023-02-16 LAB — CBC WITH DIFFERENTIAL/PLATELET
Absolute Lymphocytes: 3711 {cells}/uL (ref 850–3900)
Absolute Monocytes: 498 {cells}/uL (ref 200–950)
Basophils Absolute: 62 {cells}/uL (ref 0–200)
Basophils Relative: 0.7 %
Eosinophils Absolute: 356 {cells}/uL (ref 15–500)
Eosinophils Relative: 4 %
HCT: 37.8 % (ref 35.0–45.0)
Hemoglobin: 12.6 g/dL (ref 11.7–15.5)
MCH: 31.6 pg (ref 27.0–33.0)
MCHC: 33.3 g/dL (ref 32.0–36.0)
MCV: 94.7 fL (ref 80.0–100.0)
MPV: 9.7 fL (ref 7.5–12.5)
Monocytes Relative: 5.6 %
Neutro Abs: 4272 {cells}/uL (ref 1500–7800)
Neutrophils Relative %: 48 %
Platelets: 409 10*3/uL — ABNORMAL HIGH (ref 140–400)
RBC: 3.99 10*6/uL (ref 3.80–5.10)
RDW: 13.3 % (ref 11.0–15.0)
Total Lymphocyte: 41.7 %
WBC: 8.9 10*3/uL (ref 3.8–10.8)

## 2023-02-16 LAB — COMPLETE METABOLIC PANEL WITH GFR
AG Ratio: 1.8 (calc) (ref 1.0–2.5)
ALT: 11 U/L (ref 6–29)
AST: 14 U/L (ref 10–35)
Albumin: 4.2 g/dL (ref 3.6–5.1)
Alkaline phosphatase (APISO): 103 U/L (ref 37–153)
BUN: 9 mg/dL (ref 7–25)
CO2: 26 mmol/L (ref 20–32)
Calcium: 9.5 mg/dL (ref 8.6–10.4)
Chloride: 107 mmol/L (ref 98–110)
Creat: 0.74 mg/dL (ref 0.50–1.03)
Globulin: 2.4 g/dL (ref 1.9–3.7)
Glucose, Bld: 79 mg/dL (ref 65–99)
Potassium: 4.2 mmol/L (ref 3.5–5.3)
Sodium: 142 mmol/L (ref 135–146)
Total Bilirubin: 0.5 mg/dL (ref 0.2–1.2)
Total Protein: 6.6 g/dL (ref 6.1–8.1)
eGFR: 95 mL/min/{1.73_m2} (ref >=60)

## 2023-02-16 LAB — LIPID PANEL
Cholesterol: 147 mg/dL (ref <200)
HDL: 52 mg/dL (ref >=50)
LDL Cholesterol (Calc): 76 mg/dL
Non-HDL Cholesterol (Calc): 95 mg/dL (ref <130)
Total CHOL/HDL Ratio: 2.8 (calc) (ref <5.0)
Triglycerides: 102 mg/dL (ref <150)

## 2023-02-17 NOTE — Progress Notes (Unsigned)
Pamela Livingston, female    DOB: 03-May-1965    MRN: [id number]   Brief patient profile:  84  yowf active smoker   Pamela Livingston pt self -referred to pulmonary clinic in [place]  01/02/2023  for cough p pna  but doe x 2022 with nl pfts x for low ERV in 02/2022     History of Present Illness  01/02/2023  Pulmonary/ 1st office eval/ [name] / [name] on Stiolto/ ACEi  Chief Complaint  Patient presents with   Establish Care   COPD   Shortness of Breath  Dyspnea:   struggles to do grocery store /sev years since can do mb back to house  Cough: harsh / worse at hs thick white  Sleep: bed is flat/ sleeps 2 pillows freq wakening with cough and gets in recliner helps with choking sensation  SABA use: every 6 h hfa and neb  02: none  Prednisone helped some  Rec Stop lisinopril today and replace with valsartan 160 mg one daily in its place Omeprazole Take 30- 60 min before your first and last meals of the day  Stop the Delsym and replace with mucinex dm 1200 mg every 12 hours as needed  Only use your albuterol as a rescue medication to be used if you can't catch your breath by  Plan A = Automatic = Always=    Stiolto 2 puffs each am  Work on inhaler technique:  r  Plan B = Backup (to supplement plan A, not to replace it) Only use your albuterol inhaler as a rescue medication  Plan C = Crisis (instead of Plan B but only if Plan B stops working) - only use your albuterol nebulizer if you first try Plan B  Depomedrol 120 mg IM  Please schedule a follow up office visit in 4 weeks, sooner if needed  with all medications /inhalers/ solutions in hand    02/18/2023  f/u ov/[name] re: cough p pna  but doe x 2022? Acei case maint on stiolto sometimes   did  bring meds / still smokng  Chief Complaint  Patient presents with   Follow-up  Dyspnea:  doing [employer] now  Cough: almost gone / some rattle in am Sleeping: flat bed/ 2 pillow s    resp cc  SABA use: rarely needed  02: none    Lung cancer screening: May 2025    No obvious day to day or daytime variability or assoc excess/ purulent sputum or mucus plugs or hemoptysis or cp or chest tightness, subjective wheeze or overt sinus or hb symptoms.    Also denies any obvious fluctuation of symptoms with weather or environmental changes or other aggravating or alleviating factors except as outlined above   No unusual exposure hx or h/o childhood pna/ asthma or knowledge of premature birth.  Current Allergies, Complete Past Medical History, Past Surgical History, Family History, and Social History were reviewed in [employer] record.  ROS  The following are not active complaints unless bolded Hoarseness, sore throat, dysphagia, dental problems, itching, sneezing,  nasal congestion or discharge of excess mucus or purulent secretions, ear ache,   fever, chills, sweats, unintended wt loss or wt gain, classically pleuritic or exertional cp,  orthopnea pnd or arm/hand swelling  or leg swelling, presyncope, palpitations, abdominal pain, anorexia, nausea, vomiting, diarrhea  or change in bowel habits or change in bladder habits, change in stools or change in urine, dysuria, hematuria,  rash, arthralgias, visual complaints, headache,  numbness, weakness or ataxia or problems with walking or coordination,  change in mood or  memory.        Current Meds  Medication Sig   acetaminophen (TYLENOL) 325 MG tablet Take 3 tablets by mouth every 6 (six) hours as needed for moderate pain.   atorvastatin (LIPITOR) 40 MG tablet Take 1 tablet (40 mg total) by mouth daily.   diphenhydrAMINE (BENADRYL) 25 mg capsule Take 25 mg by mouth at bedtime as needed for sleep.   gabapentin (NEURONTIN) 400 MG capsule TAKE 1 CAPSULE BY MOUTH THREE TIMES DAILY   Loratadine-Pseudoephedrine (CLARITIN-D 12 HOUR PO) Take 1 tablet by mouth daily.   montelukast (SINGULAIR) 10 MG tablet TAKE 1 TABLET BY MOUTH ONCE DAILY AS NEEDED   nicotine  (NICODERM CQ - DOSED IN MG/24 HR) 7 mg/24hr patch Place 1 patch (7 mg total) onto the skin daily.   nitroGLYCERIN (NITROSTAT) 0.4 MG SL tablet Place 1 tablet (0.4 mg total) under the tongue every 5 (five) minutes as needed for chest pain.   omeprazole (PRILOSEC) 40 MG capsule TAKE 1 CAPSULE BY MOUTH IN THE MORNING AND AT BEDTIME   ondansetron (ZOFRAN) 4 MG tablet Take 1 tablet (4 mg total) by mouth every 8 (eight) hours as needed for nausea or vomiting.   Tiotropium Bromide-Olodaterol (STIOLTO RESPIMAT) 2.5-2.5 MCG/ACT AERS Inhale 2 puffs into the lungs daily.   valsartan (DIOVAN) 160 MG tablet Take 1 tablet (160 mg total) by mouth daily.   VENTOLIN HFA 108 (90 Base) MCG/ACT inhaler INHALE 2 PUFFS BY MOUTH EVERY 6 HOURS AS NEEDED FOR WHEEZING OR SHORTNESS OF BREATH          Past Medical History:  Diagnosis Date   Asthma    Chronic pelvic pain in female    COPD (chronic obstructive pulmonary disease) (HCC)    Endometriosis    Pneumonia 10/2021   Polysubstance abuse (HCC)    marijuana, cocaine, benzos   Stomach ulcer    Suicide attempt (HCC) 2008   by phenergan overdose   Syncope       Objective:     02/18/2023        144   01/02/23 142 lb (64.4 kg)  12/06/22 146 lb (66.2 kg)  11/27/22 147 lb (66.7 kg)    Vital signs reviewed  02/18/2023  - Note at rest 02 sats  97% on RA   General appearance:    pleasant amb wf/ minimal smoker's rattle     HEENT : Oropharynx  clear/ edentulous      Nasal turbinates nl    NECK :  without  apparent JVD/ palpable Nodes/TM    LUNGS: no acc muscle use,  Nl contour chest which is clear to A and P bilaterally without cough on insp or exp maneuvers   CV:  RRR  no s3 or murmur or increase in P2, and no edema   ABD:  soft and nontender   MS:  Gait nl   ext warm without deformities Or obvious joint restrictions  calf tenderness, cyanosis or clubbing    SKIN: warm and dry without lesions    NEURO:  alert, approp, nl sensorium with  no motor  or cerebellar deficits apparent.          I personally reviewed images and agree with radiology impression as follows:  CXR:   pa and lateral 12/06/22 1. Mild bronchitic changes. 2. COPD    Assessment

## 2023-02-18 ENCOUNTER — Encounter: Payer: Self-pay | Admitting: Internal Medicine

## 2023-02-18 ENCOUNTER — Ambulatory Visit: Payer: Medicaid Other | Admitting: Internal Medicine

## 2023-02-18 ENCOUNTER — Other Ambulatory Visit: Payer: Self-pay | Admitting: Family Medicine

## 2023-02-18 VITALS — BP 136/91 | HR 74 | Wt 144.0 lb

## 2023-02-18 DIAGNOSIS — R058 Other specified cough: Secondary | ICD-10-CM | POA: Diagnosis not present

## 2023-02-18 DIAGNOSIS — F17210 Nicotine dependence, cigarettes, uncomplicated: Secondary | ICD-10-CM | POA: Diagnosis not present

## 2023-02-18 DIAGNOSIS — I10 Essential (primary) hypertension: Secondary | ICD-10-CM

## 2023-02-18 NOTE — Assessment & Plan Note (Signed)
Counseled re importance of smoking cessation but did not meet time criteria for separate billing    Low-dose CT lung cancer screening is recommended for patients who are 60-56 years of age with a 20+ pack-year history of smoking and who are currently smoking or quit <=15 years ago. No coughing up blood  No unintentional weight loss of > 15 pounds in the last 6 months - pt is eligible for scanning yearly starting in 05/2023 for at least the next 15 years.  Discussed in detail all the  indications, usual  risks and alternatives  relative to the benefits with patient who agrees to proceed with w/u as outlined.       F/u in this clinic is prn           Each maintenance medication was reviewed in detail including emphasizing most importantly the difference between maintenance and prns and under what circumstances the prns are to be triggered using an action plan format where appropriate.  Total time for H and P, chart review, counseling, reviewing hfa/smi device(s) and generating customized AVS unique to this office visit / same day charting = 20 min summary f/u ov

## 2023-02-18 NOTE — Assessment & Plan Note (Signed)
D/c acei 01/02/2023 due to violent upper airway cough/ pseudowheeze > resolved 02/18/2023  Although even in retrospect it may not be  proven yet that the ACEi contributed to the pt's symptoms,  Pt improved off them and adding them back at this point or in the future would risk confusion in interpretation of non-specific respiratory symptoms to which this patient is prone  ie  Better not to muddy the waters here.    >>> defer further rx to PCP / refills on diovan 160 or alternative non-ACEi

## 2023-02-18 NOTE — Patient Instructions (Addendum)
My office will be contacting you by phone for referral to lung cancer screening program  (  [phone]) - if you don't hear back from my office within one week please call us back or notify us thru MyChart and we'll address it right away.   If you are satisfied with your treatment plan,  let your doctor know and he/she can either refill your medications or you can return here when your prescription runs out.     If in any way you are not 100% satisfied,  please tell us.  If 100% better, tell your friends!  Pulmonary follow up is as needed

## 2023-02-18 NOTE — Assessment & Plan Note (Signed)
Onset 2022 with nl pfts 02/2020 prev dx as copd/ AB - 01/02/2023  After extensive coaching inhaler device,  effectiveness =    75% with SMI/ hfa  - d/c ace 01/02/2023 with max gerd rx and mucinex dm and depomedrol 120 mg IM    >>>02/18/2023 completely free of cough/ wheeze and off all inhalers with improving ex tol   Pulmonary fu can therefor be prn

## 2023-04-19 ENCOUNTER — Other Ambulatory Visit: Payer: Self-pay | Admitting: Family Medicine

## 2023-05-01 ENCOUNTER — Ambulatory Visit: Payer: Self-pay | Admitting: Family Medicine

## 2023-05-01 NOTE — Telephone Encounter (Signed)
 Copied from CRM [id number]. Topic: Clinical - Red Word Triage >> May 01, 2023 10:42 AM Franchot Heidelberg wrote: Red Word that prompted transfer to Nurse Triage: Difficulty breathing   Chief Complaint: sinus congestion Symptoms: productive cough, sinus headache Frequency: constant Pertinent Negatives: Patient denies fever Disposition: [] ED /[] Urgent Care (no appt availability in office) / [] Appointment(In office/virtual)/ []  [facility]/ [] Home Care/ [] Refused Recommended Disposition /[] [facility]/ []  Follow-up with PCP Additional Notes: sinus congestion and runny nose since Saturday, unrelieved with OTC meds. Now with productive cough. Concern for sinus infection requiring abx. Appt scheduled with PCP on 4/10  Reason for Disposition  [1] Sinus congestion (pressure, fullness) AND [2] present > 10 days    4 days but getting worse, now with productive cough  Answer Assessment - Initial Assessment Questions 1. LOCATION: "Where does it hurt?"      Nasal and head pressure  2. ONSET: "When did the sinus pain start?"  (e.g., hours, days)      4 days  3. SEVERITY: "How bad is the pain?"   (Scale 1-10; mild, moderate or severe)   - MILD (1-3): doesn't interfere with normal activities    - MODERATE (4-7): interferes with normal activities (e.g., work or school) or awakens from sleep   - SEVERE (8-10): excruciating pain and patient unable to do any normal activities        Mild  4. RECURRENT SYMPTOM: "Have you ever had sinus problems before?" If Yes, ask: "When was the last time?" and "What happened that time?"      Yes, due to seasonal allergies  5. NASAL CONGESTION: "Is the nose blocked?" If Yes, ask: "Can you open it or must you breathe through your mouth?"     Yes  6. NASAL DISCHARGE: "Do you have discharge from your nose?" If so ask, "What color?"     Yes, when blowing nose Yellowish green  7. FEVER: "Do you have a fever?" If Yes, ask: "What is it, how was it measured,  and when did it start?"      No  8. OTHER SYMPTOMS: "Do you have any other symptoms?" (e.g., sore throat, cough, earache, difficulty breathing)     Productive cough, right ear popping  9. PREGNANCY: "Is there any chance you are pregnant?" "When was your last menstrual period?"     no  Protocols used: Sinus Pain or Congestion-A-AH

## 2023-05-02 ENCOUNTER — Encounter: Payer: Self-pay | Admitting: Family Medicine

## 2023-05-02 ENCOUNTER — Ambulatory Visit: Admitting: Family Medicine

## 2023-05-02 VITALS — BP 136/80 | HR 73 | Temp 97.7°F | Ht 59.0 in | Wt 145.0 lb

## 2023-05-02 DIAGNOSIS — J019 Acute sinusitis, unspecified: Secondary | ICD-10-CM | POA: Diagnosis not present

## 2023-05-02 DIAGNOSIS — Z1231 Encounter for screening mammogram for malignant neoplasm of breast: Secondary | ICD-10-CM

## 2023-05-02 DIAGNOSIS — B9689 Other specified bacterial agents as the cause of diseases classified elsewhere: Secondary | ICD-10-CM | POA: Insufficient documentation

## 2023-05-02 MED ORDER — AIRSUPRA 90-80 MCG/ACT IN AERO: 2.0000 | INHALATION_SPRAY | Freq: Four times a day (QID) | RESPIRATORY_TRACT | 11 refills | Status: AC | PRN

## 2023-05-02 MED ORDER — OMEPRAZOLE 40 MG PO CPDR: DELAYED_RELEASE_CAPSULE | ORAL | 0 refills | Status: DC

## 2023-05-02 MED ORDER — OMEPRAZOLE 40 MG PO CPDR
40.0000 mg | DELAYED_RELEASE_CAPSULE | Freq: Every day | ORAL | 1 refills | Status: DC
Start: 2023-05-02 — End: 2023-09-02

## 2023-05-02 MED ORDER — AMOXICILLIN-POT CLAVULANATE 875-125 MG PO TABS: 1.0000 | ORAL_TABLET | Freq: Two times a day (BID) | ORAL | 0 refills | Status: DC

## 2023-05-02 MED ORDER — STIOLTO RESPIMAT 2.5-2.5 MCG/ACT IN AERS
2.0000 | INHALATION_SPRAY | Freq: Every day | RESPIRATORY_TRACT | 11 refills | Status: AC
Start: 2023-05-02 — End: ?

## 2023-05-02 MED ORDER — VENTOLIN HFA 108 (90 BASE) MCG/ACT IN AERS: 2.0000 | INHALATION_SPRAY | Freq: Four times a day (QID) | RESPIRATORY_TRACT | 0 refills | Status: DC | PRN

## 2023-05-02 NOTE — Assessment & Plan Note (Signed)
 Symptoms consistent with acute bacterial sinusitis. Start Augmentin BID x7d. Continue albuterol PRN, refill sent, Claritin, Mucinex, nasal saline flushes. Return to office if symptoms persist or worsen.

## 2023-05-02 NOTE — Progress Notes (Signed)
 Subjective:  HPI: Pamela Livingston is a 58 y.o. female presenting on 05/02/2023 for Sinus Problem (Pt come sin today with complaints of sinus pressure along with ear pressure with congestion that is yellow and green in color./Pt states this began when the pollen started coming out so she is assuming it is allergies)   Sinus Problem   Patient is in today for sinus congestion, pressure, coughing mucopurulent phlegm that is malodorous and distasteful for 1 week. She reports frontal sinus and ear pressure on the right side. Cough is worse at night. She does have allergies.  Denies worsening SOB, wheezing, fever, chills, body aches No known sick exposures.  Has tried Mucinex and Claritin.  Review of Systems  All other systems reviewed and are negative.   Relevant past medical history reviewed and updated as indicated.   Past Medical History:  Diagnosis Date   Asthma    Chronic pelvic pain in female    COPD (chronic obstructive pulmonary disease) (HCC)    Endometriosis    Pneumonia 10/2021   Polysubstance abuse (HCC)    marijuana, cocaine, benzos   Stomach ulcer    Suicide attempt (HCC) 2008   by phenergan overdose   Syncope      Past Surgical History:  Procedure Laterality Date   ABDOMINAL HYSTERECTOMY     BALLOON DILATION N/A 07/26/2020   Procedure: BALLOON DILATION;  Surgeon: Lanelle Bal, DO;  [facility];  Service: Endoscopy;  Laterality: N/A;   BIOPSY  07/26/2020   Procedure: BIOPSY;  Surgeon: Lanelle Bal, DO;  Location: [facility];  Service: Endoscopy;;   BIOPSY  11/14/2021   Procedure: BIOPSY;  Surgeon: Iva Boop, MD;  Location: [facility] ENDOSCOPY;  Service: Gastroenterology;;   CERVICAL CONE BIOPSY     cervical cancer   CESAREAN SECTION  1993   CHOLECYSTECTOMY     COLONOSCOPY WITH PROPOFOL N/A 07/26/2020   Procedure: COLONOSCOPY WITH PROPOFOL;  Surgeon: Lanelle Bal, DO;  Location: [facility];  Service: Endoscopy;  Laterality: N/A;  12:00pm    ESOPHAGOGASTRODUODENOSCOPY (EGD) WITH PROPOFOL N/A 07/26/2020   Procedure: ESOPHAGOGASTRODUODENOSCOPY (EGD) WITH PROPOFOL;  Surgeon: Lanelle Bal, DO;  [facility]: [facility];  Service: Endoscopy;  Laterality: N/A;   ESOPHAGOGASTRODUODENOSCOPY (EGD) WITH PROPOFOL N/A 11/14/2021   Procedure: ESOPHAGOGASTRODUODENOSCOPY (EGD) WITH PROPOFOL;  Surgeon: Iva Boop, MD;  Location: [facility] ENDOSCOPY;  Service: Gastroenterology;  Laterality: N/A;   RADICAL HYSTERECTOMY WITH TRANSPOSITION OF OVARIES     TOOTH EXTRACTION     TUBAL LIGATION  1993    Allergies and medications reviewed and updated.   Current Outpatient Medications:    Albuterol-Budesonide (AIRSUPRA) 90-80 MCG/ACT AERO, Inhale 2 puffs into the lungs every 6 (six) hours as needed., Disp: 1 g, Rfl: 11   amoxicillin-clavulanate (AUGMENTIN) 875-125 MG tablet, Take 1 tablet by mouth 2 (two) times daily., Disp: 20 tablet, Rfl: 0   acetaminophen (TYLENOL) 325 MG tablet, Take 3 tablets by mouth every 6 (six) hours as needed for moderate pain., Disp: , Rfl:    atorvastatin (LIPITOR) 40 MG tablet, Take 1 tablet (40 mg total) by mouth daily., Disp: 90 tablet, Rfl: 3   diphenhydrAMINE (BENADRYL) 25 mg capsule, Take 25 mg by mouth at bedtime as needed for sleep., Disp: , Rfl:    gabapentin (NEURONTIN) 400 MG capsule, TAKE 1 CAPSULE BY MOUTH THREE TIMES DAILY, Disp: 180 capsule, Rfl: 1   Loratadine-Pseudoephedrine (CLARITIN-D 12 HOUR PO), Take 1 tablet by  mouth daily., Disp: , Rfl:    montelukast (SINGULAIR) 10 MG tablet, TAKE 1 TABLET BY MOUTH ONCE DAILY AS NEEDED, Disp: 90 tablet, Rfl: 0   nicotine (NICODERM CQ - DOSED IN MG/24 HR) 7 mg/24hr patch, Place 1 patch (7 mg total) onto the skin daily., Disp: 30 patch, Rfl: 1   nitroGLYCERIN (NITROSTAT) 0.4 MG SL tablet, Place 1 tablet (0.4 mg total) under the tongue every 5 (five) minutes as needed for chest pain., Disp: 25 tablet, Rfl: prn   omeprazole (PRILOSEC) 40 MG capsule, Take 1 capsule (40 mg  total) by mouth daily. TAKE 1 CAPSULE BY MOUTH IN THE MORNING AND AT BEDTIME, Disp: 90 capsule, Rfl: 1   ondansetron (ZOFRAN) 4 MG tablet, Take 1 tablet (4 mg total) by mouth every 8 (eight) hours as needed for nausea or vomiting., Disp: 20 tablet, Rfl: 0   Tiotropium Bromide-Olodaterol (STIOLTO RESPIMAT) 2.5-2.5 MCG/ACT AERS, Inhale 2 puffs into the lungs daily., Disp: 1 each, Rfl: 11   valsartan (DIOVAN) 160 MG tablet, Take 1 tablet (160 mg total) by mouth daily., Disp: 30 tablet, Rfl: 11  Allergies  Allergen Reactions   Asa [Aspirin] Anaphylaxis, Hives and Swelling   Codeine Nausea Only    Objective:   BP 136/80   Pulse 73   Temp 97.7 F (36.5 C)   Ht 4\' 11"  (1.499 m)   Wt 145 lb (65.8 kg)   SpO2 96%   BMI 29.29 kg/m      05/02/2023   11:22 AM 02/18/2023    3:00 PM 02/14/2023   10:32 AM  Vitals with BMI  Height 4\' 11"   4\' 11"   Weight 145 lbs 144 lbs 142 lbs  BMI 29.27 29.07 28.67  Systolic 136 136 161  Diastolic 80 91 80  Pulse 73 74 67     Physical Exam Vitals and nursing note reviewed.  Constitutional:      Appearance: Normal appearance. She is normal weight.  HENT:     Head: Normocephalic and atraumatic.     Right Ear: Tympanic membrane, ear canal and external ear normal.     Left Ear: Tympanic membrane, ear canal and external ear normal.     Nose: Congestion present.     Right Sinus: Frontal sinus tenderness present.     Mouth/Throat:     Mouth: Mucous membranes are moist.     Pharynx: Oropharynx is clear.  Eyes:     Extraocular Movements: Extraocular movements intact.     Conjunctiva/sclera: Conjunctivae normal.  Cardiovascular:     Rate and Rhythm: Normal rate and regular rhythm.     Pulses: Normal pulses.     Heart sounds: Normal heart sounds.  Pulmonary:     Effort: Pulmonary effort is normal.     Breath sounds: Rhonchi present.  Musculoskeletal:     Cervical back: No tenderness.  Lymphadenopathy:     Cervical: No cervical adenopathy.  Skin:     General: Skin is warm and dry.  Neurological:     General: No focal deficit present.     Mental Status: She is alert and oriented to person, place, and time. Mental status is at baseline.  Psychiatric:        Mood and Affect: Mood normal.        Behavior: Behavior normal.        Thought Content: Thought content normal.        Judgment: Judgment normal.     Assessment & Plan:  Acute bacterial  sinusitis Assessment & Plan: Symptoms consistent with acute bacterial sinusitis. Start Augmentin BID x7d. Continue albuterol PRN, refill sent, Claritin, Mucinex, nasal saline flushes. Return to office if symptoms persist or worsen.   Encounter for screening mammogram for malignant neoplasm of breast -     Digital Screening Mammogram, Left and Right; Future  Other orders -     Amoxicillin-Pot Clavulanate; Take 1 tablet by mouth 2 (two) times daily.  Dispense: 20 tablet; Refill: 0 -     Stiolto Respimat; Inhale 2 puffs into the lungs daily.  Dispense: 1 each; Refill: 11 -     Airsupra; Inhale 2 puffs into the lungs every 6 (six) hours as needed.  Dispense: 1 g; Refill: 11 -     Omeprazole; Take 1 capsule (40 mg total) by mouth daily. TAKE 1 CAPSULE BY MOUTH IN THE MORNING AND AT BEDTIME  Dispense: 90 capsule; Refill: 1     Follow up plan: Return if symptoms worsen or fail to improve.  Park Meo, FNP

## 2023-05-08 ENCOUNTER — Encounter: Admitting: Orthopaedic Surgery

## 2023-05-15 ENCOUNTER — Other Ambulatory Visit (INDEPENDENT_AMBULATORY_CARE_PROVIDER_SITE_OTHER): Payer: Self-pay

## 2023-05-15 ENCOUNTER — Encounter: Payer: Self-pay | Admitting: Orthopaedic Surgery

## 2023-05-15 ENCOUNTER — Ambulatory Visit: Admitting: Orthopaedic Surgery

## 2023-05-15 VITALS — BP 155/88 | HR 72 | Ht 59.0 in | Wt 144.0 lb

## 2023-05-15 DIAGNOSIS — M5442 Lumbago with sciatica, left side: Secondary | ICD-10-CM | POA: Diagnosis not present

## 2023-05-15 DIAGNOSIS — G8929 Other chronic pain: Secondary | ICD-10-CM | POA: Diagnosis not present

## 2023-05-15 NOTE — Patient Instructions (Signed)
 MRI of lumbar spine

## 2023-05-15 NOTE — Addendum Note (Signed)
 Addended by: [place] Snow T on: 05/15/2023 10:33 AM   Modules accepted: Orders

## 2023-05-15 NOTE — Progress Notes (Signed)
 My back pain is worse.  She has pain radiating down the right leg to the foot.  She has had this for several weeks getting worse.  She had MRI of the lumbar spine in 2023 showing: IMPRESSION: L4-5: Advanced bilateral facet arthropathy with gaping fluid-filled joints. 3 mm of anterolisthesis that could worsen with standing or flexion. Mild bulging of the disc. Mild narrowing of the lateral recesses and foramina but no definite neural compression. Encroachment upon the foramen on the right is more pronounced on the left and it is possible the right L4 nerve could be affected. The facet arthropathy could relate to back pain or referred facet syndrome pain.   L5-S1: Bilateral facet osteoarthritis. No stenosis. Findings could relate to back pain or referred facet syndrome pain.   Lesser facet osteoarthritis at L2-3 and L3-4.  ROM of the back is decreased.  SLR raising weakly positive right at 30 degrees.  DTRs intact.  Gait is good but she uses a cane on the right.  Muscle tone and strength are normal.    X-rays were done of the lumbar spine, reported separately.  Encounter Diagnosis  Name Primary?   Chronic left-sided low back pain with left-sided sciatica Yes   I will get a new MRI.  I am concerned more about HNP at L4 nerve root on the right.  Return in two weeks.  She cannot take NSAIDs.  She is on Neurontin .  Call if any problem.  Precautions discussed.  Electronically Signed Pleasant Brilliant, MD 4/23/202510:15 AM

## 2023-05-22 ENCOUNTER — Other Ambulatory Visit: Payer: Self-pay | Admitting: Family Medicine

## 2023-05-27 ENCOUNTER — Ambulatory Visit (HOSPITAL_COMMUNITY)

## 2023-06-13 ENCOUNTER — Other Ambulatory Visit: Payer: Self-pay | Admitting: Family Medicine

## 2023-06-14 ENCOUNTER — Other Ambulatory Visit: Payer: Self-pay | Admitting: Family Medicine

## 2023-07-09 ENCOUNTER — Other Ambulatory Visit: Payer: Self-pay | Admitting: Family Medicine

## 2023-07-10 NOTE — Telephone Encounter (Signed)
 Attempted to contact for more information. No contact made. Lvm for call back .

## 2023-07-19 ENCOUNTER — Other Ambulatory Visit: Payer: Self-pay | Admitting: Family Medicine

## 2023-08-02 ENCOUNTER — Other Ambulatory Visit: Payer: Self-pay | Admitting: Family Medicine

## 2023-08-13 ENCOUNTER — Other Ambulatory Visit: Payer: Self-pay | Admitting: Family Medicine

## 2023-08-14 ENCOUNTER — Other Ambulatory Visit: Payer: Self-pay | Admitting: Family Medicine

## 2023-08-14 NOTE — Telephone Encounter (Unsigned)
 Copied from CRM [id number]. Topic: Clinical - Medication Refill >> Aug 14, 2023 12:17 PM Deleta S wrote: Medication: Gabapentin  400 mg   Has the patient contacted their pharmacy? Yes (Agent: If no, request that the patient contact the pharmacy for the refill. If patient does not wish to contact the pharmacy document the reason why and proceed with request.) (Agent: If yes, when and what did the pharmacy advise?) contact clinic  This is the patient's preferred pharmacy:  [facility] [address] [place] - [address] [place] [address] Phone: [phone] Fax: [phone]  Is this the correct pharmacy for this prescription? Yes If no, delete pharmacy and type the correct one.   Has the prescription been filled recently? Yes  Is the patient out of the medication? No  Has the patient been seen for an appointment in the last year OR does the patient have an upcoming appointment? Yes  Can we respond through MyChart? No  Agent: Please be advised that Rx refills may take up to 3 business days. We ask that you follow-up with your pharmacy.

## 2023-08-14 NOTE — Telephone Encounter (Signed)
 Copied from CRM [id number]. Topic: Clinical - Medication Refill >> Aug 14, 2023 12:17 PM Deleta S wrote: Medication: Gabapentin  400 mg   Has the patient contacted their pharmacy? Yes (Agent: If no, request that the patient contact the pharmacy for the refill. If patient does not wish to contact the pharmacy document the reason why and proceed with request.) (Agent: If yes, when and what did the pharmacy advise?) contact clinic  This is the patient's preferred pharmacy:  [facility] [address] [place] - [address] [place] [address] Phone: [phone] Fax: [phone]  Is this the correct pharmacy for this prescription? Yes If no, delete pharmacy and type the correct one.   Has the prescription been filled recently? Yes  Is the patient out of the medication? No  Has the patient been seen for an appointment in the last year OR does the patient have an upcoming appointment? Yes  Can we respond through MyChart? No  Agent: Please be advised that Rx refills may take up to 3 business days. We ask that you follow-up with your pharmacy.

## 2023-08-16 MED ORDER — GABAPENTIN 400 MG PO CAPS
400.0000 mg | ORAL_CAPSULE | Freq: Three times a day (TID) | ORAL | 0 refills | Status: DC
Start: 2023-08-16 — End: 2023-08-19

## 2023-08-16 NOTE — Telephone Encounter (Signed)
 Duplicate request, signed 08/16/23.  Requested Prescriptions  Pending Prescriptions Disp Refills   gabapentin  (NEURONTIN ) 400 MG capsule 180 capsule 0    Sig: Take 1 capsule (400 mg total) by mouth 3 (three) times daily.     Neurology: Anticonvulsants - gabapentin  Passed - 08/16/2023  9:37 AM      Passed - Cr in normal range and within 360 days    Creat  Date Value Ref Range Status  02/15/2023 0.74 0.50 - 1.03 mg/dL Final         Passed - Completed PHQ-2 or PHQ-9 in the last 360 days      Passed - Valid encounter within last 12 months    Recent Outpatient Visits           3 months ago Acute bacterial sinusitis   [place] [facility] Kayla Jeoffrey RAMAN, FNP   6 months ago Primary hypertension   [place] [facility] Kayla Jeoffrey RAMAN, FNP   8 months ago Nausea   [place] [facility] Kayla Jeoffrey RAMAN, FNP   8 months ago Chronic obstructive pulmonary disease, unspecified COPD type [facility])   [place] [facility] Kayla Jeoffrey RAMAN, FNP   10 months ago Visit for suture removal   [place] [facility] Pickard, Butler DASEN, MD

## 2023-08-16 NOTE — Telephone Encounter (Signed)
 Last refilled 06/18/23 for 60 days, patient is taking medication 3 x a day.  Requested Prescriptions  Pending Prescriptions Disp Refills   gabapentin  (NEURONTIN ) 400 MG capsule 270 capsule 0    Sig: Take 1 capsule (400 mg total) by mouth 3 (three) times daily.     Neurology: Anticonvulsants - gabapentin  Passed - 08/16/2023  9:35 AM      Passed - Cr in normal range and within 360 days    Creat  Date Value Ref Range Status  02/15/2023 0.74 0.50 - 1.03 mg/dL Final         Passed - Completed PHQ-2 or PHQ-9 in the last 360 days      Passed - Valid encounter within last 12 months    Recent Outpatient Visits           3 months ago Acute bacterial sinusitis   [place] [facility] Kayla Jeoffrey RAMAN, FNP   6 months ago Primary hypertension   [place] [facility] Kayla Jeoffrey RAMAN, FNP   8 months ago Nausea   [place] [facility] Kayla Jeoffrey RAMAN, FNP   8 months ago Chronic obstructive pulmonary disease, unspecified COPD type [facility])   [place] [facility] Kayla Jeoffrey RAMAN, FNP   10 months ago Visit for suture removal    [place] [facility] Pickard, Butler DASEN, MD

## 2023-08-19 ENCOUNTER — Ambulatory Visit: Payer: Medicaid Other | Admitting: Family Medicine

## 2023-08-19 ENCOUNTER — Encounter: Payer: Self-pay | Admitting: Family Medicine

## 2023-08-19 VITALS — BP 128/90 | HR 70 | Temp 98.3°F | Ht 59.0 in | Wt 151.0 lb

## 2023-08-19 DIAGNOSIS — I10 Essential (primary) hypertension: Secondary | ICD-10-CM

## 2023-08-19 DIAGNOSIS — E785 Hyperlipidemia, unspecified: Secondary | ICD-10-CM | POA: Diagnosis not present

## 2023-08-19 DIAGNOSIS — K219 Gastro-esophageal reflux disease without esophagitis: Secondary | ICD-10-CM

## 2023-08-19 DIAGNOSIS — I700 Atherosclerosis of aorta: Secondary | ICD-10-CM

## 2023-08-19 DIAGNOSIS — J449 Chronic obstructive pulmonary disease, unspecified: Secondary | ICD-10-CM

## 2023-08-19 DIAGNOSIS — M545 Low back pain, unspecified: Secondary | ICD-10-CM

## 2023-08-19 DIAGNOSIS — E669 Obesity, unspecified: Secondary | ICD-10-CM

## 2023-08-19 DIAGNOSIS — Z1231 Encounter for screening mammogram for malignant neoplasm of breast: Secondary | ICD-10-CM

## 2023-08-19 DIAGNOSIS — G8929 Other chronic pain: Secondary | ICD-10-CM

## 2023-08-19 MED ORDER — DULOXETINE HCL 30 MG PO CPEP
30.0000 mg | ORAL_CAPSULE | Freq: Every day | ORAL | 3 refills | Status: DC
Start: 2023-08-19 — End: 2023-08-27

## 2023-08-19 MED ORDER — GABAPENTIN 100 MG PO CAPS: 200.0000 mg | ORAL_CAPSULE | Freq: Three times a day (TID) | ORAL | 1 refills | Status: DC

## 2023-08-19 NOTE — Assessment & Plan Note (Signed)
 Continue Prilosec 40mg  daily. Elevated HOB if needed and avoid lying down 2-3 hours after eating, avoid coffee, alcohol, chocolate, fatty foods, citrus, carbonated beverages, spicy foods, late meals, and smoking. Return to office if symptoms return or worsen and seek medical care for difficulty swallowing, bleeding, anemia, weight loss, or recurrent vomiting.

## 2023-08-19 NOTE — Assessment & Plan Note (Signed)
 Counseled on importance of weight management for overall health. Encouraged low calorie, heart healthy diet and moderate intensity exercise 150 minutes weekly. This is 3-5 times weekly for 30-50 minutes each session. Goal should be pace of 3 miles/hours, or walking 1.5 miles in 30 minutes and include strength training.

## 2023-08-19 NOTE — Assessment & Plan Note (Signed)
 Followed by Ortho, working to get MRI covered. Currently taking Gabapentin  400mg  TID, would like to decrease this, could benefit from Cymbalta  due to mood benefits. Decrease Gabapentin  to 200mg  TID and start Cymbalta  30mg  daily. Follow up in 4 weeks.

## 2023-08-19 NOTE — Assessment & Plan Note (Signed)
 Has quit smoking! Pulm nodule resolved. Followed by Pulm. Continue inhalers. No exacerbations

## 2023-08-19 NOTE — Assessment & Plan Note (Signed)
 Fasting labs ordered. Continue atorvastatin. I recommend consuming a heart healthy diet such as Mediterranean diet or DASH diet with whole grains, fruits, vegetable, fish, lean meats, nuts, and olive oil. Limit sweets and processed foods. I also encourage moderate intensity exercise 150 minutes weekly. This is 3-5 times weekly for 30-50 minutes each session. Goal should be pace of 3 miles/hours, or walking 1.5 miles in 30 minutes.

## 2023-08-19 NOTE — Progress Notes (Signed)
 Subjective:  HPI: Pamela Livingston is a 58 y.o. female presenting on 08/19/2023 for No chief complaint on file.   HPI Patient is in today for chronic condition management. Sadly Pamela Livingston recently lost her brother in law and has had worsening anxiety and depression due to this. Since last seeing Pamela Livingston she has quit smoking cigarettes, I am so proud of her for this!  GERD: well controlled on Prilosec 40mg  daily, no dysphagia, weight loss or GI bleeding  Aortic Atherosclerosis: chronic conditions well controlled  HYPERTENSION / HYPERLIPIDEMIA Satisfied with current treatment? yes Duration of hypertension: chronic BP monitoring frequency: a few times a week BP range: <130/80 BP medication side effects: no Past BP meds:  valsartan  Duration of hyperlipidemia: chronic Cholesterol medication side effects: no Cholesterol supplements: none Past cholesterol medications:  atorvastatin  Medication compliance: excellent compliance Aspirin: no Recent stressors: yes Recurrent headaches: no Visual changes: no Palpitations: no Dyspnea: no Chest pain: no Lower extremity edema: no Dizzy/lightheaded: no      08/19/2023    8:03 AM 05/02/2023   11:33 AM 02/14/2023   10:40 AM 12/06/2022   10:56 AM  GAD 7 : Generalized Anxiety Score  Nervous, Anxious, on Edge 0 1 1 1   Control/stop worrying 0 1 1 3   Worry too much - different things 0  1 3  Trouble relaxing 3  1 3   Restless 2  1 3   Easily annoyed or irritable 1  0 3  Afraid - awful might happen 0  0 1  Total GAD 7 Score 6  5 17   Anxiety Difficulty Not difficult at all   Somewhat difficult       08/19/2023    8:03 AM 05/02/2023   11:32 AM 02/14/2023   10:40 AM 12/06/2022   12:26 PM 11/27/2022   11:07 AM  Depression screen PHQ 2/9  Decreased Interest 1 1 1 1 3   Down, Depressed, Hopeless 1 1 0 1 3  PHQ - 2 Score 2 2 1 2 6   Altered sleeping 3 3 2 1 3   Tired, decreased energy 3 3 2 1 3   Change in appetite 1 1 2 1 3   Feeling bad or failure  about yourself  0 0 0 1 3  Trouble concentrating 1 1 0 1   Moving slowly or fidgety/restless 0 1 0 0 0  Suicidal thoughts 0 0 0 0   PHQ-9 Score 10 11 7 7 18   Difficult doing work/chores Not difficult at all          Review of Systems  All other systems reviewed and are negative.   Relevant past medical history reviewed and updated as indicated.   Past Medical History:  Diagnosis Date   Asthma    Chronic pelvic pain in female    COPD (chronic obstructive pulmonary disease) (HCC)    Endometriosis    Pneumonia 10/2021   Polysubstance abuse (HCC)    marijuana, cocaine, benzos   Stomach ulcer    Suicide attempt (HCC) 2008   by phenergan  overdose   Syncope      Past Surgical History:  Procedure Laterality Date   ABDOMINAL HYSTERECTOMY     BALLOON DILATION N/A 07/26/2020   Procedure: BALLOON DILATION;  Surgeon: Cindie Carlin POUR, DO;  Location: [facility];  Service: Endoscopy;  Laterality: N/A;   BIOPSY  07/26/2020   Procedure: BIOPSY;  Surgeon: Cindie Carlin POUR, DO;  Location: [facility];  Service: Endoscopy;;   BIOPSY  11/14/2021  Procedure: BIOPSY;  Surgeon: Avram Lupita BRAVO, MD;  Location: [facility];  Service: Gastroenterology;;   CERVICAL CONE BIOPSY     cervical cancer   CESAREAN SECTION  1993   CHOLECYSTECTOMY     COLONOSCOPY WITH PROPOFOL  N/A 07/26/2020   Procedure: COLONOSCOPY WITH PROPOFOL ;  Surgeon: Cindie Carlin POUR, DO;  Location: [facility];  Service: Endoscopy;  Laterality: N/A;  12:00pm   ESOPHAGOGASTRODUODENOSCOPY (EGD) WITH PROPOFOL  N/A 07/26/2020   Procedure: ESOPHAGOGASTRODUODENOSCOPY (EGD) WITH PROPOFOL ;  Surgeon: Cindie Carlin POUR, DO;  Location: [facility];  Service: Endoscopy;  Laterality: N/A;   ESOPHAGOGASTRODUODENOSCOPY (EGD) WITH PROPOFOL  N/A 11/14/2021   Procedure: ESOPHAGOGASTRODUODENOSCOPY (EGD) WITH PROPOFOL ;  Surgeon: Avram Lupita BRAVO, MD;  Location: [facility] ENDOSCOPY;  Service: Gastroenterology;  Laterality: N/A;   RADICAL HYSTERECTOMY  WITH TRANSPOSITION OF OVARIES     TOOTH EXTRACTION     TUBAL LIGATION  1993    Allergies and medications reviewed and updated.   Current Outpatient Medications:    acetaminophen  (TYLENOL ) 325 MG tablet, Take 3 tablets by mouth every 6 (six) hours as needed for moderate pain., Disp: , Rfl:    Albuterol -Budesonide  (AIRSUPRA ) 90-80 MCG/ACT AERO, Inhale 2 puffs into the lungs every 6 (six) hours as needed., Disp: 1 g, Rfl: 11   atorvastatin  (LIPITOR) 40 MG tablet, Take 1 tablet (40 mg total) by mouth daily., Disp: 90 tablet, Rfl: 3   diphenhydrAMINE  (BENADRYL ) 25 mg capsule, Take 25 mg by mouth at bedtime as needed for sleep., Disp: , Rfl:    DULoxetine  (CYMBALTA ) 30 MG capsule, Take 1 capsule (30 mg total) by mouth daily., Disp: 90 capsule, Rfl: 3   montelukast  (SINGULAIR ) 10 MG tablet, TAKE 1 TABLET BY MOUTH ONCE DAILY AS NEEDED, Disp: 90 tablet, Rfl: 0   nicotine  (NICODERM CQ  - DOSED IN MG/24 HR) 7 mg/24hr patch, Place 1 patch (7 mg total) onto the skin daily., Disp: 30 patch, Rfl: 1   nitroGLYCERIN  (NITROSTAT ) 0.4 MG SL tablet, Place 1 tablet (0.4 mg total) under the tongue every 5 (five) minutes as needed for chest pain., Disp: 25 tablet, Rfl: prn   omeprazole  (PRILOSEC) 40 MG capsule, Take 1 capsule (40 mg total) by mouth daily. TAKE 1 CAPSULE BY MOUTH IN THE MORNING AND AT BEDTIME, Disp: 90 capsule, Rfl: 1   ondansetron  (ZOFRAN ) 4 MG tablet, Take 1 tablet (4 mg total) by mouth every 8 (eight) hours as needed for nausea or vomiting., Disp: 20 tablet, Rfl: 0   Tiotropium Bromide-Olodaterol (STIOLTO RESPIMAT ) 2.5-2.5 MCG/ACT AERS, Inhale 2 puffs into the lungs daily., Disp: 1 each, Rfl: 11   valsartan  (DIOVAN ) 160 MG tablet, Take 1 tablet (160 mg total) by mouth daily., Disp: 30 tablet, Rfl: 11   VENTOLIN  HFA 108 (90 Base) MCG/ACT inhaler, INHALE 2 PUFFS BY MOUTH EVERY 6 HOURS AS NEEDED FOR WHEEZING OR SHORTNESS OF BREATH, Disp: 18 g, Rfl: 0   gabapentin  (NEURONTIN ) 100 MG capsule, Take 2  capsules (200 mg total) by mouth 3 (three) times daily., Disp: 270 capsule, Rfl: 1   Loratadine -Pseudoephedrine  (CLARITIN -D 12 HOUR PO), Take 1 tablet by mouth daily. (Patient not taking: Reported on 08/19/2023), Disp: , Rfl:   Allergies  Allergen Reactions   Asa [Aspirin] Anaphylaxis, Hives and Swelling   Codeine Nausea Only    Objective:   BP (!) 128/90   Pulse 70   Temp 98.3 F (36.8 C)   Ht 4' 11 (1.499 m)   Wt 151 lb (68.5 kg)   SpO2  99%   BMI 30.50 kg/m      08/19/2023    7:59 AM 05/15/2023    9:48 AM 05/02/2023   11:22 AM  Vitals with BMI  Height 4' 11 4' 11 4' 11  Weight 151 lbs 144 lbs 145 lbs  BMI 30.48 29.07 29.27  Systolic 128 155 863  Diastolic 90 88 80  Pulse 70 72 73     Physical Exam Vitals and nursing note reviewed.  Constitutional:      Appearance: Normal appearance. She is normal weight.  HENT:     Head: Normocephalic and atraumatic.  Cardiovascular:     Rate and Rhythm: Normal rate and regular rhythm.     Pulses: Normal pulses.     Heart sounds: Normal heart sounds.  Pulmonary:     Effort: Pulmonary effort is normal.     Breath sounds: Normal breath sounds.  Skin:    General: Skin is warm and dry.  Neurological:     General: No focal deficit present.     Mental Status: She is alert and oriented to person, place, and time. Mental status is at baseline.  Psychiatric:        Mood and Affect: Mood normal.        Behavior: Behavior normal.        Thought Content: Thought content normal.        Judgment: Judgment normal.     Assessment & Plan:  Hyperlipidemia, unspecified hyperlipidemia type Assessment & Plan: Fasting labs ordered. Continue atorvastatin . I recommend consuming a heart healthy diet such as Mediterranean diet or DASH diet with whole grains, fruits, vegetable, fish, lean meats, nuts, and olive oil. Limit sweets and processed foods. I also encourage moderate intensity exercise 150 minutes weekly. This is 3-5 times weekly for  30-50 minutes each session. Goal should be pace of 3 miles/hours, or walking 1.5 miles in 30 minutes.  Orders: -     Comprehensive metabolic panel with GFR -     Lipid panel  Primary hypertension Assessment & Plan: Chronic well controlled, elevated today due to stress. Continue Valsartan  160mg  daily. Continue to monitor at home and return to office if sustains >130/80. Recommend heart healthy diet such as Mediterranean diet with whole grains, fruits, vegetable, fish, lean meats, nuts, and olive oil. Limit salt. Encouraged moderate walking, 3-5 times/week for 30-50 minutes each session. Aim for at least 150 minutes.week. Goal should be pace of 3 miles/hours, or walking 1.5 miles in 30 minutes. Avoid tobacco products. Avoid excess alcohol. Take medications as prescribed and bring medications and blood pressure log with cuff to each office visit. Seek medical care for chest pain, palpitations, shortness of breath with exertion, dizziness/lightheadedness, vision changes, recurrent headaches, or swelling of extremities. Follow up in 4 weeks   Orders: -     Comprehensive metabolic panel with GFR -     Lipid panel  Encounter for screening mammogram for malignant neoplasm of breast -     Digital Screening Mammogram, Left and Right; Future  Chronic obstructive pulmonary disease, unspecified COPD type (HCC) Assessment & Plan: Has quit smoking! Pulm nodule resolved. Followed by Pulm. Continue inhalers. No exacerbations   Gastroesophageal reflux disease without esophagitis Assessment & Plan: Continue Prilosec 40mg  daily. Elevated HOB if needed and avoid lying down 2-3 hours after eating, avoid coffee, alcohol, chocolate, fatty foods, citrus, carbonated beverages, spicy foods, late meals, and smoking. Return to office if symptoms return or worsen and seek medical care for difficulty swallowing,  bleeding, anemia, weight loss, or recurrent vomiting.     Aortic atherosclerosis (HCC) Assessment &  Plan: Lipids and CMP today, monitoring BP at home and will return to office if sustains >130/80   Obesity (BMI 30-39.9) Assessment & Plan: Counseled on importance of weight management for overall health. Encouraged low calorie, heart healthy diet and moderate intensity exercise 150 minutes weekly. This is 3-5 times weekly for 30-50 minutes each session. Goal should be pace of 3 miles/hours, or walking 1.5 miles in 30 minutes and include strength training.   Chronic left-sided low back pain without sciatica Assessment & Plan: Followed by Ortho, working to get MRI covered. Currently taking Gabapentin  400mg  TID, would like to decrease this, could benefit from Cymbalta  due to mood benefits. Decrease Gabapentin  to 200mg  TID and start Cymbalta  30mg  daily. Follow up in 4 weeks.    Other orders -     Gabapentin ; Take 2 capsules (200 mg total) by mouth 3 (three) times daily.  Dispense: 270 capsule; Refill: 1 -     DULoxetine  HCl; Take 1 capsule (30 mg total) by mouth daily.  Dispense: 90 capsule; Refill: 3     Follow up plan: Return in about 4 weeks (around 09/16/2023) for follow-up, anxiety/depression.  Jeoffrey GORMAN Barrio, FNP

## 2023-08-19 NOTE — Assessment & Plan Note (Signed)
 Lipids and CMP today, monitoring BP at home and will return to office if sustains >130/80

## 2023-08-19 NOTE — Assessment & Plan Note (Signed)
 Chronic well controlled, elevated today due to stress. Continue Valsartan  160mg  daily. Continue to monitor at home and return to office if sustains >130/80. Recommend heart healthy diet such as Mediterranean diet with whole grains, fruits, vegetable, fish, lean meats, nuts, and olive oil. Limit salt. Encouraged moderate walking, 3-5 times/week for 30-50 minutes each session. Aim for at least 150 minutes.week. Goal should be pace of 3 miles/hours, or walking 1.5 miles in 30 minutes. Avoid tobacco products. Avoid excess alcohol. Take medications as prescribed and bring medications and blood pressure log with cuff to each office visit. Seek medical care for chest pain, palpitations, shortness of breath with exertion, dizziness/lightheadedness, vision changes, recurrent headaches, or swelling of extremities. Follow up in 4 weeks

## 2023-08-20 ENCOUNTER — Ambulatory Visit: Payer: Self-pay | Admitting: Family Medicine

## 2023-08-20 LAB — COMPREHENSIVE METABOLIC PANEL WITH GFR
AG Ratio: 1.8 (calc) (ref 1.0–2.5)
ALT: 15 U/L (ref 6–29)
AST: 17 U/L (ref 10–35)
Albumin: 4.2 g/dL (ref 3.6–5.1)
Alkaline phosphatase (APISO): 85 U/L (ref 37–153)
BUN: 7 mg/dL (ref 7–25)
CO2: 25 mmol/L (ref 20–32)
Calcium: 9.5 mg/dL (ref 8.6–10.4)
Chloride: 109 mmol/L (ref 98–110)
Creat: 0.71 mg/dL (ref 0.50–1.03)
Globulin: 2.3 g/dL (ref 1.9–3.7)
Glucose, Bld: 78 mg/dL (ref 65–99)
Potassium: 3.8 mmol/L (ref 3.5–5.3)
Sodium: 141 mmol/L (ref 135–146)
Total Bilirubin: 0.4 mg/dL (ref 0.2–1.2)
Total Protein: 6.5 g/dL (ref 6.1–8.1)
eGFR: 100 mL/min/1.73m2 (ref >=60)

## 2023-08-20 LAB — LIPID PANEL
Cholesterol: 238 mg/dL — ABNORMAL HIGH (ref <200)
HDL: 44 mg/dL — ABNORMAL LOW (ref >=50)
LDL Cholesterol (Calc): 167 mg/dL — ABNORMAL HIGH
Non-HDL Cholesterol (Calc): 194 mg/dL — ABNORMAL HIGH (ref <130)
Total CHOL/HDL Ratio: 5.4 (calc) — ABNORMAL HIGH (ref <5.0)
Triglycerides: 137 mg/dL (ref <150)

## 2023-08-22 ENCOUNTER — Encounter (HOSPITAL_COMMUNITY): Payer: Self-pay | Admitting: Emergency Medicine

## 2023-08-22 ENCOUNTER — Emergency Department (HOSPITAL_COMMUNITY)
Admission: EM | Admit: 2023-08-22 | Discharge: 2023-08-22 | Discharge status: Against Medical Advice | Attending: Emergency Medicine | Admitting: Emergency Medicine

## 2023-08-22 ENCOUNTER — Other Ambulatory Visit: Payer: Self-pay

## 2023-08-22 DIAGNOSIS — Z5321 Procedure and treatment not carried out due to patient leaving prior to being seen by health care provider: Secondary | ICD-10-CM | POA: Diagnosis not present

## 2023-08-22 DIAGNOSIS — R519 Headache, unspecified: Secondary | ICD-10-CM | POA: Diagnosis present

## 2023-08-22 DIAGNOSIS — I10 Essential (primary) hypertension: Secondary | ICD-10-CM | POA: Diagnosis not present

## 2023-08-22 NOTE — ED Notes (Signed)
 Pt stating that she wants to leave. Pt notified of risks of leaving, pt still wanting to leave. IV removed, pt ambulatory out of ED.

## 2023-08-22 NOTE — ED Triage Notes (Signed)
 Pt BIB EMS from home with c/o hypertension and headache since yesterday.   4mg  zofran  given by ems

## 2023-08-23 ENCOUNTER — Ambulatory Visit: Payer: Self-pay

## 2023-08-23 NOTE — Telephone Encounter (Signed)
 FYI Only or Action Required?: Action required by provider: update on patient condition.   Patient was last seen in primary care on 08/19/2023 by Pamela Jeoffrey RAMAN, FNP.  Called Nurse Triage reporting Hypertension.  Symptoms began several days ago.  Interventions attempted: Prescription medications: Cymbalta  and Valsartan .  Symptoms are: gradually worsening.  Triage Disposition: See PCP Within 2 Weeks  Patient/caregiver understands and will follow disposition?: Yes  FYI: Patient seen on 7/28 and started on Cymbalta  for pain and mood benefits. Gabapentin  dose was decreased at that time as well. Pt now reporting elevated BP and headaches since starting the medication. Patient stopped taking medication today at BP improved: 180s/90 yesterday, 168/90 this morning and 145/84 while on phone with NT.  Pt asking for medication adjustment, says that she cannot tolerate most meds for anxiety and depression. They make go crazy per patient.  Pt says she will hold off on the Cymbalta  for now, resume taking Gabapentin  400mg , and wait to hear back from PCP with further suggestion.   Copied from CRM [id number]. Topic: Clinical - Red Word ----------------------------------------------------------------------- From previous Reason for Contact - Pink Word Triage: Reason for Triage: Pt called, declined to schedule   ----------------------------------------------------------------------- From previous Reason for Contact - Scheduling: Patient/patient representative is calling to schedule an appointment. Refer to attachments for appointment information. Reason for Disposition  [1] Systolic BP >= 130 OR Diastolic >= 80 AND [2] taking BP medications  Answer Assessment - Initial Assessment Questions 1. BLOOD PRESSURE: What is your blood pressure? Did you take at least two measurements 5 minutes apart?     168/90 then 145/84  2. ONSET: When did you take your blood pressure?     Started running high ion  Monday  3. HOW: How did you take your blood pressure? (e.g., automatic home BP monitor, visiting nurse)     Home BP monitor  4. HISTORY: Do you have a history of high blood pressure?     Yes  5. MEDICINES: Are you taking any medicines for blood pressure? Have you missed any doses recently?     Valsartan  160mg   6. OTHER SYMPTOMS: Do you have any symptoms? (e.g., blurred vision, chest pain, difficulty breathing, headache, weakness)     Headache and nausea  7. PREGNANCY: Is there any chance you are pregnant? When was your last menstrual period?     No  Protocols used: Blood Pressure - High-A-AH

## 2023-08-26 NOTE — Telephone Encounter (Signed)
 Scheduled at 8 am for hospital f/u can discuss that and all other issues.

## 2023-08-27 ENCOUNTER — Ambulatory Visit: Admitting: Family Medicine

## 2023-08-27 ENCOUNTER — Encounter: Payer: Self-pay | Admitting: Family Medicine

## 2023-08-27 VITALS — BP 128/80 | HR 71 | Temp 98.0°F | Ht 59.0 in | Wt 149.0 lb

## 2023-08-27 DIAGNOSIS — B9689 Other specified bacterial agents as the cause of diseases classified elsewhere: Secondary | ICD-10-CM

## 2023-08-27 DIAGNOSIS — J019 Acute sinusitis, unspecified: Secondary | ICD-10-CM

## 2023-08-27 DIAGNOSIS — J069 Acute upper respiratory infection, unspecified: Secondary | ICD-10-CM | POA: Diagnosis not present

## 2023-08-27 DIAGNOSIS — I10 Essential (primary) hypertension: Secondary | ICD-10-CM | POA: Diagnosis not present

## 2023-08-27 MED ORDER — ONDANSETRON HCL 4 MG PO TABS: 4.0000 mg | ORAL_TABLET | Freq: Three times a day (TID) | ORAL | 0 refills | Status: AC | PRN

## 2023-08-27 MED ORDER — GABAPENTIN 400 MG PO CAPS: 400.0000 mg | ORAL_CAPSULE | Freq: Three times a day (TID) | ORAL | 1 refills | Status: AC

## 2023-08-27 MED ORDER — ATORVASTATIN CALCIUM 80 MG PO TABS: 80.0000 mg | ORAL_TABLET | Freq: Every day | ORAL | 1 refills | Status: DC

## 2023-08-27 MED ORDER — AMOXICILLIN-POT CLAVULANATE 875-125 MG PO TABS: 1.0000 | ORAL_TABLET | Freq: Two times a day (BID) | ORAL | 0 refills | Status: DC

## 2023-08-27 NOTE — Assessment & Plan Note (Addendum)
 Symptoms consistent with acute bacterial sinusitis. Start Augmentin  BID x7d. Continue albuterol  PRN, Claritin , Mucinex , nasal saline flushes. Return to office if symptoms persist or worsen. Zofran  PRN for nausea, push electrolyte fluids and bland diet. No lab available today, diarrhea not severe, she is tolerating PO, advised when to seek emergent medical care.

## 2023-08-27 NOTE — Progress Notes (Signed)
 Subjective:  HPI: Pamela Livingston is a 58 y.o. female presenting on 08/27/2023 for Hypertension   Hypertension   Patient is in today for blood pressure follow up. At her last office visit she was started on Duloxetine  for pain and mood however her blood pressure was elevated at home after starting. She since discontinued Duloxetine  and restarted her previous dose of Gabapentin . She also reports today nausea, intermittent diarrhea, right sided headache, cough, congestion since Thursday. She is having watery bowel movements up to 4 times daily. Has been able to tolerate bland PO intake. Denies hematochezia, melena, abdominal pain, vomiting.  Denies fever, chills, body aches. Denies chest pain, palpitations, recurrent headaches, vision changes, lightheadedness, dizziness, dyspnea on exertion, or swelling of extremities. Negative home covid swab. Has tried Tylenol  Sinus.   Review of Systems  All other systems reviewed and are negative.   Relevant past medical history reviewed and updated as indicated.   Past Medical History:  Diagnosis Date   Asthma    Chronic pelvic pain in female    COPD (chronic obstructive pulmonary disease) (HCC)    Endometriosis    Pneumonia 10/2021   Polysubstance abuse (HCC)    marijuana, cocaine, benzos   Stomach ulcer    Suicide attempt (HCC) 2008   by phenergan  overdose   Syncope      Past Surgical History:  Procedure Laterality Date   ABDOMINAL HYSTERECTOMY     BALLOON DILATION N/A 07/26/2020   Procedure: BALLOON DILATION;  Surgeon: Cindie Carlin POUR, DO;  Location: [facility];  Service: Endoscopy;  Laterality: N/A;   BIOPSY  07/26/2020   Procedure: BIOPSY;  Surgeon: Cindie Carlin POUR, DO;  Location: [facility];  Service: Endoscopy;;   BIOPSY  11/14/2021   Procedure: BIOPSY;  Surgeon: Avram Lupita BRAVO, MD;  Location: [facility] ENDOSCOPY;  Service: Gastroenterology;;   CERVICAL CONE BIOPSY     cervical cancer   CESAREAN SECTION  1993   CHOLECYSTECTOMY      COLONOSCOPY WITH PROPOFOL  N/A 07/26/2020   Procedure: COLONOSCOPY WITH PROPOFOL ;  Surgeon: Cindie Carlin POUR, DO;  Location: [facility];  Service: Endoscopy;  Laterality: N/A;  12:00pm   ESOPHAGOGASTRODUODENOSCOPY (EGD) WITH PROPOFOL  N/A 07/26/2020   Procedure: ESOPHAGOGASTRODUODENOSCOPY (EGD) WITH PROPOFOL ;  Surgeon: Cindie Carlin POUR, DO;  [facility]: [facility];  Service: Endoscopy;  Laterality: N/A;   ESOPHAGOGASTRODUODENOSCOPY (EGD) WITH PROPOFOL  N/A 11/14/2021   Procedure: ESOPHAGOGASTRODUODENOSCOPY (EGD) WITH PROPOFOL ;  Surgeon: Avram Lupita BRAVO, MD;  Location: [facility] ENDOSCOPY;  Service: Gastroenterology;  Laterality: N/A;   RADICAL HYSTERECTOMY WITH TRANSPOSITION OF OVARIES     TOOTH EXTRACTION     TUBAL LIGATION  1993    Allergies and medications reviewed and updated.   Current Outpatient Medications:    acetaminophen  (TYLENOL ) 325 MG tablet, Take 3 tablets by mouth every 6 (six) hours as needed for moderate pain., Disp: , Rfl:    Albuterol -Budesonide  (AIRSUPRA ) 90-80 MCG/ACT AERO, Inhale 2 puffs into the lungs every 6 (six) hours as needed., Disp: 1 g, Rfl: 11   amoxicillin -clavulanate (AUGMENTIN ) 875-125 MG tablet, Take 1 tablet by mouth 2 (two) times daily., Disp: 14 tablet, Rfl: 0   diphenhydrAMINE  (BENADRYL ) 25 mg capsule, Take 25 mg by mouth at bedtime as needed for sleep., Disp: , Rfl:    montelukast  (SINGULAIR ) 10 MG tablet, TAKE 1 TABLET BY MOUTH ONCE DAILY AS NEEDED, Disp: 90 tablet, Rfl: 0   nicotine  (NICODERM CQ  - DOSED IN MG/24 HR) 7 mg/24hr patch, Place  1 patch (7 mg total) onto the skin daily., Disp: 30 patch, Rfl: 1   nitroGLYCERIN  (NITROSTAT ) 0.4 MG SL tablet, Place 1 tablet (0.4 mg total) under the tongue every 5 (five) minutes as needed for chest pain., Disp: 25 tablet, Rfl: prn   omeprazole  (PRILOSEC) 40 MG capsule, Take 1 capsule (40 mg total) by mouth daily. TAKE 1 CAPSULE BY MOUTH IN THE MORNING AND AT BEDTIME, Disp: 90 capsule, Rfl: 1   Tiotropium  Bromide-Olodaterol (STIOLTO RESPIMAT ) 2.5-2.5 MCG/ACT AERS, Inhale 2 puffs into the lungs daily., Disp: 1 each, Rfl: 11   valsartan  (DIOVAN ) 160 MG tablet, Take 1 tablet (160 mg total) by mouth daily., Disp: 30 tablet, Rfl: 11   VENTOLIN  HFA 108 (90 Base) MCG/ACT inhaler, INHALE 2 PUFFS BY MOUTH EVERY 6 HOURS AS NEEDED FOR WHEEZING OR SHORTNESS OF BREATH, Disp: 18 g, Rfl: 0   atorvastatin  (LIPITOR) 80 MG tablet, Take 1 tablet (80 mg total) by mouth daily., Disp: 90 tablet, Rfl: 1   gabapentin  (NEURONTIN ) 400 MG capsule, Take 1 capsule (400 mg total) by mouth 3 (three) times daily., Disp: 270 capsule, Rfl: 1   Loratadine -Pseudoephedrine  (CLARITIN -D 12 HOUR PO), Take 1 tablet by mouth daily. (Patient not taking: Reported on 08/27/2023), Disp: , Rfl:    ondansetron  (ZOFRAN ) 4 MG tablet, Take 1 tablet (4 mg total) by mouth every 8 (eight) hours as needed for nausea or vomiting., Disp: 10 tablet, Rfl: 0  Allergies  Allergen Reactions   Asa [Aspirin] Anaphylaxis, Hives and Swelling   Codeine Nausea Only    Objective:   BP 128/80   Pulse 71   Temp 98 F (36.7 C)   Ht 4' 11 (1.499 m)   Wt 149 lb (67.6 kg)   SpO2 98%   BMI 30.09 kg/m      08/27/2023    7:51 AM 08/22/2023    7:45 PM 08/22/2023    7:30 PM  Vitals with BMI  Height 4' 11    Weight 149 lbs    BMI 30.08    Systolic 128 168 853  Diastolic 80 101 88  Pulse 71 71 69     Physical Exam Vitals and nursing note reviewed.  Constitutional:      Appearance: Normal appearance. She is normal weight.  HENT:     Head: Normocephalic and atraumatic.     Right Ear: Ear canal and external ear normal. Tympanic membrane is bulging.     Left Ear: Ear canal and external ear normal. Tympanic membrane is bulging.     Nose: Congestion present.     Right Sinus: Frontal sinus tenderness present.     Mouth/Throat:     Mouth: Mucous membranes are moist.     Pharynx: Oropharynx is clear.  Eyes:     Extraocular Movements: Extraocular movements  intact.     Conjunctiva/sclera: Conjunctivae normal.     Pupils: Pupils are equal, round, and reactive to light.  Cardiovascular:     Rate and Rhythm: Normal rate and regular rhythm.     Pulses: Normal pulses.     Heart sounds: Normal heart sounds.  Pulmonary:     Effort: Pulmonary effort is normal.     Breath sounds: Wheezing and rhonchi present.  Abdominal:     General: Bowel sounds are normal.     Palpations: Abdomen is soft.     Tenderness: There is no abdominal tenderness.  Musculoskeletal:     Cervical back: No tenderness.  Lymphadenopathy:  Cervical: No cervical adenopathy.  Skin:    General: Skin is warm and dry.  Neurological:     General: No focal deficit present.     Mental Status: She is alert and oriented to person, place, and time. Mental status is at baseline.  Psychiatric:        Mood and Affect: Mood normal.        Behavior: Behavior normal.        Thought Content: Thought content normal.        Judgment: Judgment normal.     Assessment & Plan:  Primary hypertension Assessment & Plan: Chronic well controlled, normal today in office and home readings have been back to normal. Continue Valsartan  160mg  daily. Continue to monitor at home and return to office if sustains >130/80. Recommend heart healthy diet such as Mediterranean diet with whole grains, fruits, vegetable, fish, lean meats, nuts, and olive oil. Limit salt. Encouraged moderate walking, 3-5 times/week for 30-50 minutes each session. Aim for at least 150 minutes.week. Goal should be pace of 3 miles/hours, or walking 1.5 miles in 30 minutes. Avoid tobacco products. Avoid excess alcohol. Take medications as prescribed and bring medications and blood pressure log with cuff to each office visit. Seek medical care for chest pain, palpitations, shortness of breath with exertion, dizziness/lightheadedness, vision changes, recurrent headaches, or swelling of extremities.   Acute bacterial sinusitis Assessment  & Plan: Symptoms consistent with acute bacterial sinusitis. Start Augmentin  BID x7d. Continue albuterol  PRN, Claritin , Mucinex , nasal saline flushes. Return to office if symptoms persist or worsen. Zofran  PRN for nausea, push electrolyte fluids and bland diet. No lab available today, diarrhea not severe, she is tolerating PO, advised when to seek emergent medical care.    URI with cough and congestion -     Influenza A and B Ag, Immunoassay  Other orders -     Gabapentin ; Take 1 capsule (400 mg total) by mouth 3 (three) times daily.  Dispense: 270 capsule; Refill: 1 -     Atorvastatin  Calcium ; Take 1 tablet (80 mg total) by mouth daily.  Dispense: 90 tablet; Refill: 1 -     Amoxicillin -Pot Clavulanate; Take 1 tablet by mouth 2 (two) times daily.  Dispense: 14 tablet; Refill: 0 -     Ondansetron  HCl; Take 1 tablet (4 mg total) by mouth every 8 (eight) hours as needed for nausea or vomiting.  Dispense: 10 tablet; Refill: 0     Follow up plan: Return if symptoms worsen or fail to improve.  Jeoffrey GORMAN Barrio, FNP

## 2023-08-27 NOTE — Assessment & Plan Note (Signed)
 Chronic well controlled, normal today in office and home readings have been back to normal. Continue Valsartan  160mg  daily. Continue to monitor at home and return to office if sustains >130/80. Recommend heart healthy diet such as Mediterranean diet with whole grains, fruits, vegetable, fish, lean meats, nuts, and olive oil. Limit salt. Encouraged moderate walking, 3-5 times/week for 30-50 minutes each session. Aim for at least 150 minutes.week. Goal should be pace of 3 miles/hours, or walking 1.5 miles in 30 minutes. Avoid tobacco products. Avoid excess alcohol. Take medications as prescribed and bring medications and blood pressure log with cuff to each office visit. Seek medical care for chest pain, palpitations, shortness of breath with exertion, dizziness/lightheadedness, vision changes, recurrent headaches, or swelling of extremities.

## 2023-09-02 ENCOUNTER — Other Ambulatory Visit: Payer: Self-pay | Admitting: Family Medicine

## 2023-09-13 ENCOUNTER — Inpatient Hospital Stay: Admission: RE | Admit: 2023-09-13 | Source: Ambulatory Visit

## 2023-09-16 ENCOUNTER — Ambulatory Visit: Admitting: Family Medicine

## 2023-09-16 ENCOUNTER — Ambulatory Visit: Payer: Self-pay

## 2023-09-16 ENCOUNTER — Encounter: Payer: Self-pay | Admitting: Family Medicine

## 2023-09-16 VITALS — BP 115/75 | HR 75 | Temp 98.1°F | Ht 59.0 in | Wt 147.8 lb

## 2023-09-16 DIAGNOSIS — M5442 Lumbago with sciatica, left side: Secondary | ICD-10-CM

## 2023-09-16 DIAGNOSIS — M5441 Lumbago with sciatica, right side: Secondary | ICD-10-CM

## 2023-09-16 DIAGNOSIS — G8929 Other chronic pain: Secondary | ICD-10-CM | POA: Insufficient documentation

## 2023-09-16 MED ORDER — PREDNISONE 20 MG PO TABS: ORAL_TABLET | ORAL | 0 refills | Status: DC

## 2023-09-16 NOTE — Progress Notes (Signed)
 Subjective:  HPI: Pamela Livingston is a 58 y.o. female presenting on 09/16/2023 for Acute Visit (Bilateral leg pain: arthritic pain in both legs, bottom of both feet ar sore all since Friday /Pt reports burning down legs and tight muscles.)   HPI Patient is in today for burning in both of her legs below her knees and in her feet since Friday. She endorses numbness and tingling in her feet. Her left ankle is mildly swollen. No new physical activity, fall or injury, new medications, new or worsening pain in her back. No saddle numbness, incontinence of urine or stool. No recent travel or calf pain, redness, warmth, or swelling. Is taking Gabapentin  400mg  TID and Tylenol  PRN. Has been working with orthopedics on her low back pain with sciatica. Recently an MRI got denied by insurance, she has since completed PT. Most recent x-ray 05/15/23 showed Grade I spondylolisthesis at L4-5.     Review of Systems  All other systems reviewed and are negative.   Relevant past medical history reviewed and updated as indicated.   Past Medical History:  Diagnosis Date   Asthma    Chronic pelvic pain in female    COPD (chronic obstructive pulmonary disease) (HCC)    Endometriosis    Pneumonia 10/2021   Polysubstance abuse (HCC)    marijuana, cocaine, benzos   Stomach ulcer    Suicide attempt (HCC) 2008   by phenergan  overdose   Syncope      Past Surgical History:  Procedure Laterality Date   ABDOMINAL HYSTERECTOMY     BALLOON DILATION N/A 07/26/2020   Procedure: BALLOON DILATION;  Surgeon: Cindie Carlin POUR, DO;  [facility]: [facility];  Service: Endoscopy;  Laterality: N/A;   BIOPSY  07/26/2020   Procedure: BIOPSY;  Surgeon: Cindie Carlin POUR, DO;  [facility];  Service: Endoscopy;;   BIOPSY  11/14/2021   Procedure: BIOPSY;  Surgeon: Avram Lupita BRAVO, MD;  Location: [facility] ENDOSCOPY;  Service: Gastroenterology;;   CERVICAL CONE BIOPSY     cervical cancer   CESAREAN SECTION  1993    CHOLECYSTECTOMY     COLONOSCOPY WITH PROPOFOL  N/A 07/26/2020   Procedure: COLONOSCOPY WITH PROPOFOL ;  Surgeon: Cindie Carlin POUR, DO;  [facility]: [facility];  Service: Endoscopy;  Laterality: N/A;  12:00pm   ESOPHAGOGASTRODUODENOSCOPY (EGD) WITH PROPOFOL  N/A 07/26/2020   Procedure: ESOPHAGOGASTRODUODENOSCOPY (EGD) WITH PROPOFOL ;  Surgeon: Cindie Carlin POUR, DO;  [facility];  Service: Endoscopy;  Laterality: N/A;   ESOPHAGOGASTRODUODENOSCOPY (EGD) WITH PROPOFOL  N/A 11/14/2021   Procedure: ESOPHAGOGASTRODUODENOSCOPY (EGD) WITH PROPOFOL ;  Surgeon: Avram Lupita BRAVO, MD;  Location: [facility] ENDOSCOPY;  Service: Gastroenterology;  Laterality: N/A;   RADICAL HYSTERECTOMY WITH TRANSPOSITION OF OVARIES     TOOTH EXTRACTION     TUBAL LIGATION  1993    Allergies and medications reviewed and updated.   Current Outpatient Medications:    acetaminophen  (TYLENOL ) 325 MG tablet, Take 3 tablets by mouth every 6 (six) hours as needed for moderate pain., Disp: , Rfl:    Albuterol -Budesonide  (AIRSUPRA ) 90-80 MCG/ACT AERO, Inhale 2 puffs into the lungs every 6 (six) hours as needed., Disp: 1 g, Rfl: 11   diphenhydrAMINE  (BENADRYL ) 25 mg capsule, Take 25 mg by mouth at bedtime as needed for sleep., Disp: , Rfl:    gabapentin  (NEURONTIN ) 400 MG capsule, Take 1 capsule (400 mg total) by mouth 3 (three) times daily., Disp: 270 capsule, Rfl: 1   montelukast  (SINGULAIR ) 10 MG tablet, TAKE 1 TABLET  BY MOUTH ONCE DAILY AS NEEDED, Disp: 90 tablet, Rfl: 0   nicotine  (NICODERM CQ  - DOSED IN MG/24 HR) 7 mg/24hr patch, Place 1 patch (7 mg total) onto the skin daily., Disp: 30 patch, Rfl: 1   nitroGLYCERIN  (NITROSTAT ) 0.4 MG SL tablet, Place 1 tablet (0.4 mg total) under the tongue every 5 (five) minutes as needed for chest pain., Disp: 25 tablet, Rfl: prn   omeprazole  (PRILOSEC) 40 MG capsule, TAKE 1 CAPSULE BY MOUTH IN THE MORNING AND 1 CAPSULE AT BEDTIME, Disp: 90 capsule, Rfl: 0   ondansetron  (ZOFRAN ) 4 MG tablet, Take  1 tablet (4 mg total) by mouth every 8 (eight) hours as needed for nausea or vomiting., Disp: 10 tablet, Rfl: 0   predniSONE  (DELTASONE ) 20 MG tablet, 3 tabs poqday 1-2, 2 tabs poqday 3-4, 1 tab poqday 5-6, Disp: 12 tablet, Rfl: 0   Tiotropium Bromide-Olodaterol (STIOLTO RESPIMAT ) 2.5-2.5 MCG/ACT AERS, Inhale 2 puffs into the lungs daily., Disp: 1 each, Rfl: 11   valsartan  (DIOVAN ) 160 MG tablet, Take 1 tablet (160 mg total) by mouth daily., Disp: 30 tablet, Rfl: 11   VENTOLIN  HFA 108 (90 Base) MCG/ACT inhaler, INHALE 2 PUFFS BY MOUTH EVERY 6 HOURS AS NEEDED FOR WHEEZING OR SHORTNESS OF BREATH, Disp: 18 g, Rfl: 0   amoxicillin -clavulanate (AUGMENTIN ) 875-125 MG tablet, Take 1 tablet by mouth 2 (two) times daily. (Patient not taking: Reported on 09/16/2023), Disp: 14 tablet, Rfl: 0   atorvastatin  (LIPITOR) 80 MG tablet, Take 1 tablet (80 mg total) by mouth daily. (Patient not taking: Reported on 09/16/2023), Disp: 90 tablet, Rfl: 1   Loratadine -Pseudoephedrine  (CLARITIN -D 12 HOUR PO), Take 1 tablet by mouth daily. (Patient not taking: Reported on 09/16/2023), Disp: , Rfl:   Allergies  Allergen Reactions   Asa [Aspirin] Anaphylaxis, Hives and Swelling   Codeine Nausea Only    Objective:   BP 115/75   Pulse 75   Temp 98.1 F (36.7 C)   Ht 4' 11 (1.499 m)   Wt 147 lb 12.8 oz (67 kg)   SpO2 93%   BMI 29.85 kg/m      09/16/2023   11:17 AM 08/27/2023    7:51 AM 08/22/2023    7:45 PM  Vitals with BMI  Height 4' 11 4' 11   Weight 147 lbs 13 oz 149 lbs   BMI 29.84 30.08   Systolic 115 128 831  Diastolic 75 80 101  Pulse 75 71 71     Physical Exam Vitals and nursing note reviewed.  Constitutional:      Appearance: Normal appearance. She is normal weight.  HENT:     Head: Normocephalic and atraumatic.  Musculoskeletal:     Lumbar back: Bony tenderness present.     Right lower leg: Normal.     Left lower leg: Normal.     Right ankle: Normal.     Left ankle: Swelling present.      Right foot: Normal.     Left foot: Normal.  Skin:    General: Skin is warm and dry.  Neurological:     General: No focal deficit present.     Mental Status: She is alert and oriented to person, place, and time. Mental status is at baseline.     Motor: Motor function is intact.     Coordination: Coordination is intact.     Deep Tendon Reflexes: Reflexes are normal and symmetric.  Psychiatric:        Mood and Affect: Mood  normal.        Behavior: Behavior normal.        Thought Content: Thought content normal.        Judgment: Judgment normal.     Assessment & Plan:  Chronic midline low back pain with bilateral sciatica Assessment & Plan: Ms Mcbean symptoms are consistent with her history of neuropathic pain, lumbar radiculopathy, and arthritis. Pain is improved with gabapentin  and she is using heat appropriately. Will start prednisone  taper to treat for lumbar radiculopathy and obtain repeat x-ray since she is experiencing spinal tenderness. Encouraged to follow up with ortho and work on getting the MRI. Return to office if symptoms persist or worsen.  Orders: -     DG Bone Density; Future  Other orders -     predniSONE ; 3 tabs poqday 1-2, 2 tabs poqday 3-4, 1 tab poqday 5-6  Dispense: 12 tablet; Refill: 0     Follow up plan: Return if symptoms worsen or fail to improve.  Jeoffrey GORMAN Barrio, FNP

## 2023-09-16 NOTE — Telephone Encounter (Signed)
    FYI Only or Action Required?: FYI only for provider.  Patient was last seen in primary care on 08/27/2023 by Kayla Jeoffrey RAMAN, FNP.  Called Nurse Triage reporting Pain.  Symptoms began Friday.  Interventions attempted: Nothing.  Symptoms are: unchanged.  Triage Disposition: See PCP When Office is Open (Within 3 Days)  Patient/caregiver understands and will follow disposition?: YesCopied from CRM #[id number]. Topic: Clinical - Red Word Triage >> Sep 16, 2023  9:18 AM Carlatta H wrote: [facility] that prompted transfer to Nurse Triage: Severe leg pain that is constant// Reason for Disposition  [1] MODERATE pain (e.g., interferes with normal activities, limping) AND [2] present > 3 days  Answer Assessment - Initial Assessment Questions 1. ONSET: When did the pain start?      Friday 2. LOCATION: Where is the pain located?      Bilateral legs from hips down to feet soreness 3. PAIN: How bad is the pain?    (Scale 1-10; or mild, moderate, severe)     severe 4. WORK OR EXERCISE: Has there been any recent work or exercise that involved this part of the body?      na 5. CAUSE: What do you think is causing the leg pain?     Unknown 6. OTHER SYMPTOMS: Do you have any other symptoms? (e.g., chest pain, back pain, breathing difficulty, swelling, rash, fever, numbness, weakness)     Left foot & calf swelling,  7. PREGNANCY: Is there any chance you are pregnant? When was your last menstrual period?     Na   feels like something is burning inside my legs  Protocols used: Leg Pain-A-AH

## 2023-09-16 NOTE — Assessment & Plan Note (Signed)
 Pamela Livingston symptoms are consistent with her history of neuropathic pain, lumbar radiculopathy, and arthritis. Pain is improved with gabapentin  and she is using heat appropriately. Will start prednisone  taper to treat for lumbar radiculopathy and obtain repeat x-ray since she is experiencing spinal tenderness. Encouraged to follow up with ortho and work on getting the MRI. Return to office if symptoms persist or worsen.

## 2023-09-18 ENCOUNTER — Ambulatory Visit (HOSPITAL_COMMUNITY)
Admission: RE | Admit: 2023-09-18 | Discharge: 2023-09-18 | Disposition: A | Discharge status: Home / Self Care | Source: Ambulatory Visit | Attending: Family Medicine | Admitting: Family Medicine

## 2023-09-18 ENCOUNTER — Encounter (HOSPITAL_COMMUNITY): Payer: Self-pay

## 2023-09-18 DIAGNOSIS — Z1231 Encounter for screening mammogram for malignant neoplasm of breast: Secondary | ICD-10-CM | POA: Insufficient documentation

## 2023-09-18 DIAGNOSIS — G8929 Other chronic pain: Secondary | ICD-10-CM | POA: Diagnosis present

## 2023-09-18 DIAGNOSIS — M5441 Lumbago with sciatica, right side: Secondary | ICD-10-CM | POA: Insufficient documentation

## 2023-09-18 DIAGNOSIS — M5442 Lumbago with sciatica, left side: Secondary | ICD-10-CM | POA: Insufficient documentation

## 2023-09-24 ENCOUNTER — Ambulatory Visit: Payer: Self-pay | Admitting: Family Medicine

## 2023-10-03 ENCOUNTER — Other Ambulatory Visit

## 2023-10-03 DIAGNOSIS — I10 Essential (primary) hypertension: Secondary | ICD-10-CM

## 2023-10-03 DIAGNOSIS — E785 Hyperlipidemia, unspecified: Secondary | ICD-10-CM

## 2023-10-03 LAB — COMPREHENSIVE METABOLIC PANEL WITH GFR
AG Ratio: 1.8 (calc) (ref 1.0–2.5)
ALT: 20 U/L (ref 6–29)
AST: 14 U/L (ref 10–35)
Albumin: 4.2 g/dL (ref 3.6–5.1)
Alkaline phosphatase (APISO): 88 U/L (ref 37–153)
BUN: 20 mg/dL (ref 7–25)
CO2: 28 mmol/L (ref 20–32)
Calcium: 9.5 mg/dL (ref 8.6–10.4)
Chloride: 105 mmol/L (ref 98–110)
Creat: 0.91 mg/dL (ref 0.50–1.03)
Globulin: 2.3 g/dL (ref 1.9–3.7)
Glucose, Bld: 84 mg/dL (ref 65–99)
Potassium: 4.6 mmol/L (ref 3.5–5.3)
Sodium: 139 mmol/L (ref 135–146)
Total Bilirubin: 0.4 mg/dL (ref 0.2–1.2)
Total Protein: 6.5 g/dL (ref 6.1–8.1)
eGFR: 74 mL/min/1.73m2 (ref >=60)

## 2023-10-03 LAB — LIPID PANEL
Cholesterol: 145 mg/dL (ref <200)
HDL: 57 mg/dL (ref >=50)
LDL Cholesterol (Calc): 68 mg/dL
Non-HDL Cholesterol (Calc): 88 mg/dL (ref <130)
Total CHOL/HDL Ratio: 2.5 (calc) (ref <5.0)
Triglycerides: 113 mg/dL (ref <150)

## 2023-10-07 ENCOUNTER — Ambulatory Visit: Payer: Self-pay | Admitting: Family Medicine

## 2023-10-16 ENCOUNTER — Other Ambulatory Visit: Payer: Self-pay | Admitting: Family Medicine

## 2023-10-17 ENCOUNTER — Other Ambulatory Visit: Payer: Self-pay | Admitting: Family Medicine

## 2023-10-18 IMAGING — DX DG CHEST 2V
2 series · 2 of 2 positions shown · non-contrast
Comparison: Chest radiograph 12/20/2019

CLINICAL DATA: Chest tightness and pressure

EXAM:
CHEST - 2 VIEW

[chest pa]
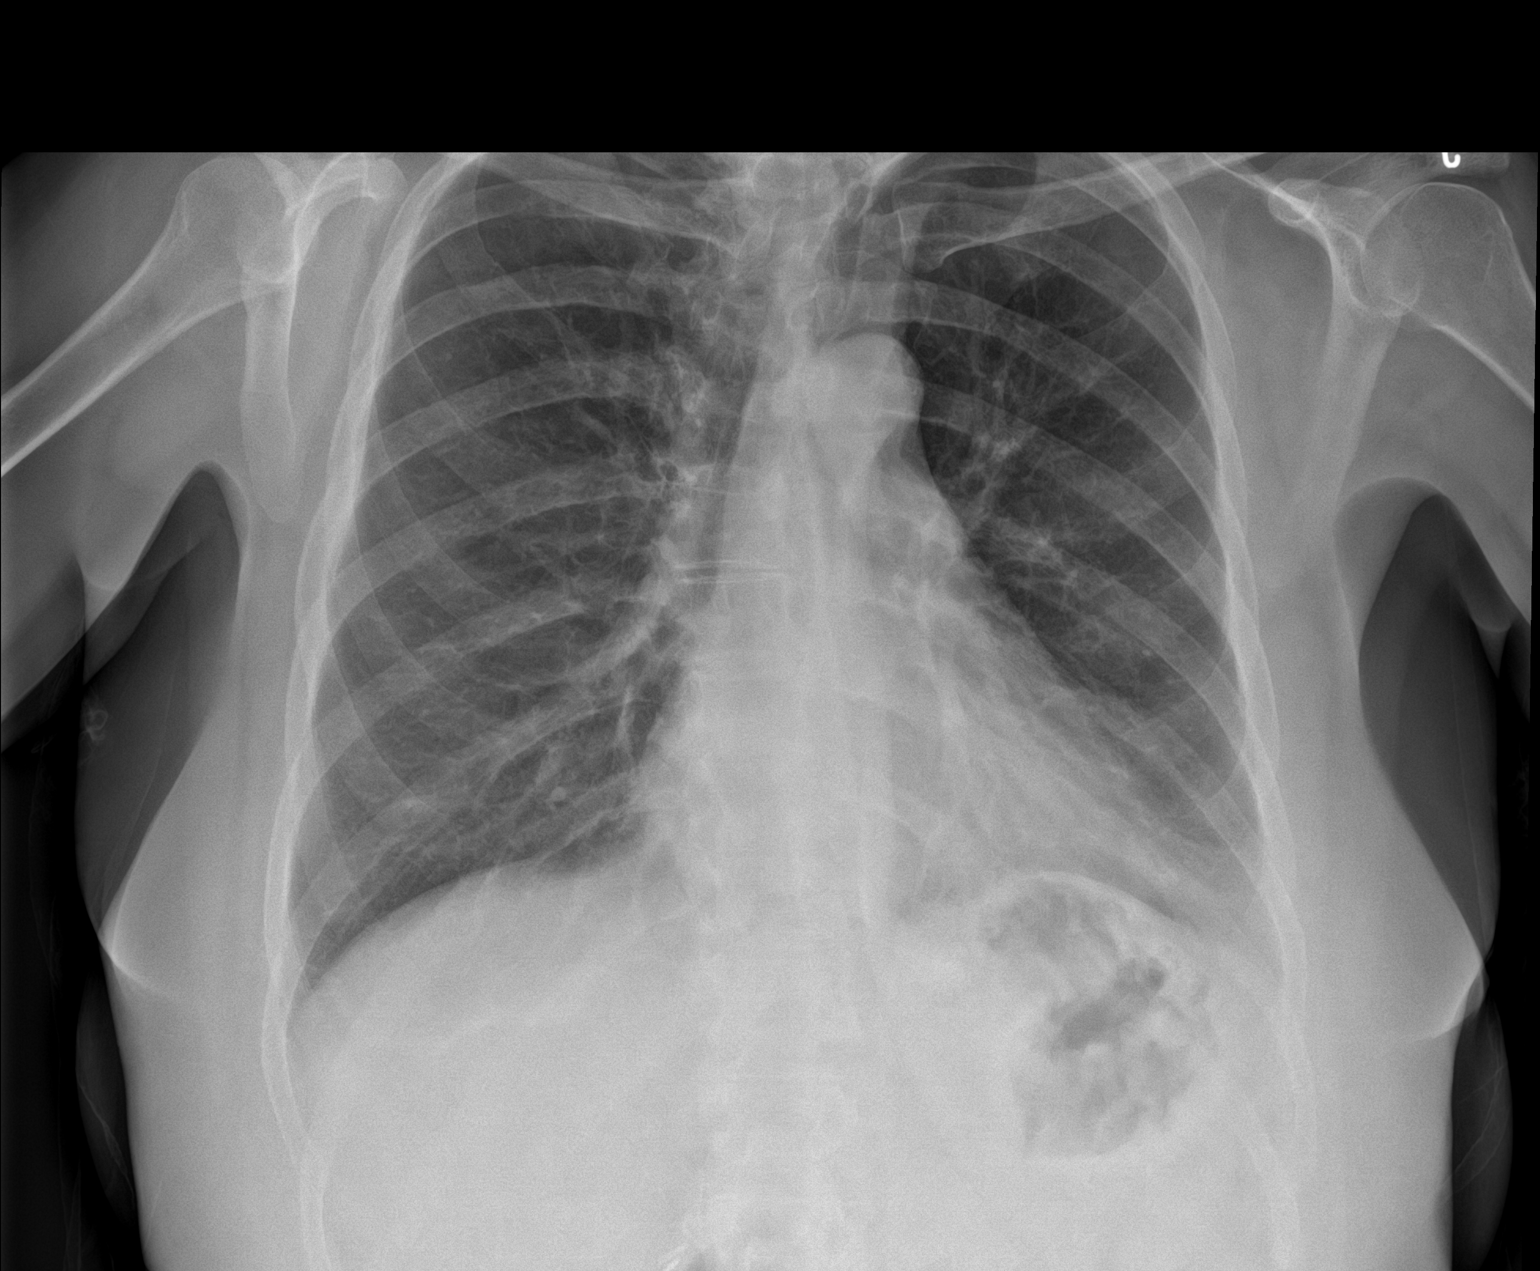

[chest lat]
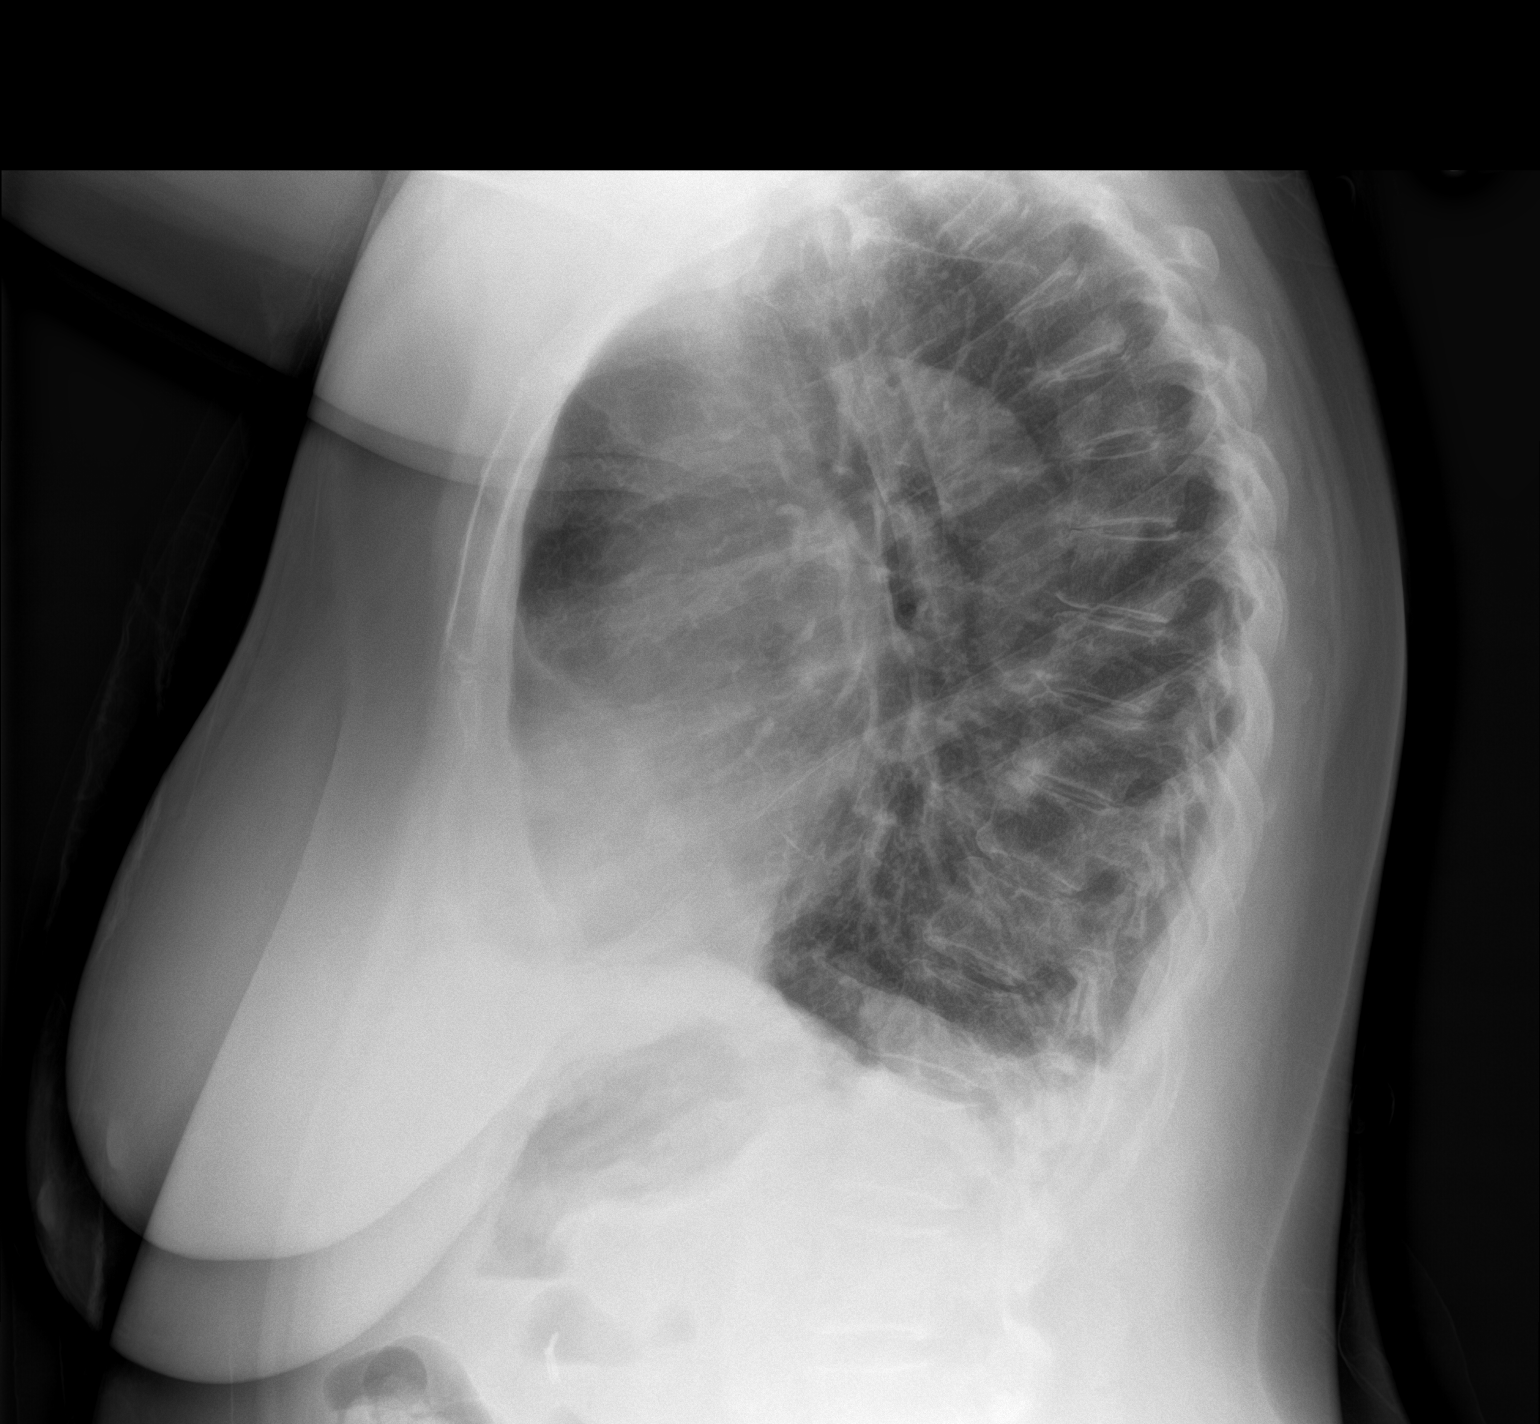

[2 of 2 positions shown; findings below may reference images not displayed]

FINDINGS: Stable cardiomegaly. Bibasilar atelectasis. No pleural effusion or
pneumothorax. Thoracic spine degenerative changes.
IMPRESSION: No acute cardiopulmonary process.

## 2023-10-23 ENCOUNTER — Ambulatory Visit: Payer: Self-pay

## 2023-10-23 NOTE — Telephone Encounter (Signed)
 FYI Only or Action Required?: FYI only for provider.  Patient was last seen in primary care on 09/16/2023 by Kayla Jeoffrey RAMAN, FNP.  Called Nurse Triage reporting Sinusitis.  Symptoms began a week ago.  Interventions attempted: OTC medications: Tylenol , Singulair .  Symptoms are: gradually worsening.  Triage Disposition: See Physician Within 24 Hours  Patient/caregiver understands and will follow disposition?: Yes  Copied from CRM #[id number]. Topic: Clinical - Red Word Triage >> Oct 23, 2023 10:52 AM Carlatta H wrote: [facility] that prompted transfer to Nurse Triage: Patient is having a painful headache, congestion and nausea for about 1 week//  ----------------------------------------------------------------------- From previous Reason for Contact - Scheduling: Patient/patient representative is calling to schedule an appointment. Refer to attachments for appointment information.  Reason for Disposition  [1] Sinus pain (not just congestion) AND [2] fever    No fever, pain unrelieved with tylenol . Has hx of sinusitis requiring steroid and abx.  Answer Assessment - Initial Assessment Questions 1. LOCATION: Where does it hurt?      Head, 9/10. Hx of severe headaches with allergy flares. 2. ONSET: When did the sinus pain start?  (e.g., hours, days)      1 week ago 3. SEVERITY: How bad is the pain?   (Scale 0-10; or none, mild, moderate or severe)     9/10. 4. RECURRENT SYMPTOM: Have you ever had sinus problems before? If Yes, ask: When was the last time? and What happened that time?      Has hx of allergies, pt thinks it's tied to allergies and changes in the weather. Takes singulair  Tylenol  sinus. Ineffective.  5. NASAL CONGESTION: Is the nose blocked? If Yes, ask: Can you open it or must you breathe through your mouth?     Blocked 6. NASAL DISCHARGE: Do you have discharge from your nose? If so ask, What color?     None 7. FEVER: Do you have a fever? If Yes, ask:  What is it, how was it measured, and when did it start?      No 8. OTHER SYMPTOMS: Do you have any other symptoms? (e.g., sore throat, cough, earache, difficulty breathing)     Dry cough. Mucinex  ineffective.  Protocols used: Sinus Pain or Congestion-A-AH

## 2023-10-24 ENCOUNTER — Encounter: Payer: Self-pay | Admitting: Family Medicine

## 2023-10-24 ENCOUNTER — Ambulatory Visit: Admitting: Family Medicine

## 2023-10-24 VITALS — BP 124/82 | HR 79 | Temp 98.6°F | Ht 59.0 in | Wt 153.6 lb

## 2023-10-24 DIAGNOSIS — B9689 Other specified bacterial agents as the cause of diseases classified elsewhere: Secondary | ICD-10-CM | POA: Diagnosis not present

## 2023-10-24 DIAGNOSIS — J019 Acute sinusitis, unspecified: Secondary | ICD-10-CM

## 2023-10-24 MED ORDER — AMOXICILLIN-POT CLAVULANATE 875-125 MG PO TABS
1.0000 | ORAL_TABLET | Freq: Two times a day (BID) | ORAL | 0 refills | Status: AC
Start: 2023-10-24 — End: ?

## 2023-10-24 NOTE — Progress Notes (Signed)
 Subjective:  HPI: Pamela Livingston is a 58 y.o. female presenting on 10/24/2023 for Acute Visit (Sinus infection symptoms . Cough, light green mucus, headache, ear pain, jaw pain )   HPI Patient is in today for cough, congestion, dental pain left side, right ear pain, headache with nausea, frontal sinus pain, green mucolytic cough for 1 week. Denies fever, chills, body aches, SOB, wheezing. No known sick exposures Has tried tylenol  sinus, claritin  D, mucinex , albuterol   Review of Systems  All other systems reviewed and are negative.   Relevant past medical history reviewed and updated as indicated.   Past Medical History:  Diagnosis Date   Asthma    Chronic pelvic pain in female    COPD (chronic obstructive pulmonary disease) (HCC)    Endometriosis    Pneumonia 10/2021   Polysubstance abuse (HCC)    marijuana, cocaine, benzos   Stomach ulcer    Suicide attempt (HCC) 2008   by phenergan  overdose   Syncope      Past Surgical History:  Procedure Laterality Date   ABDOMINAL HYSTERECTOMY     BALLOON DILATION N/A 07/26/2020   Procedure: BALLOON DILATION;  Surgeon: Cindie Carlin POUR, DO;  [facility];  Service: Endoscopy;  Laterality: N/A;   BIOPSY  07/26/2020   Procedure: BIOPSY;  Surgeon: Cindie Carlin POUR, DO;  [facility];  Service: Endoscopy;;   BIOPSY  11/14/2021   Procedure: BIOPSY;  Surgeon: Avram Lupita BRAVO, MD;  [facility];  Service: Gastroenterology;;   CERVICAL CONE BIOPSY     cervical cancer   CESAREAN SECTION  1993   CHOLECYSTECTOMY     COLONOSCOPY WITH PROPOFOL  N/A 07/26/2020   Procedure: COLONOSCOPY WITH PROPOFOL ;  Surgeon: Cindie Carlin POUR, DO;  [facility];  Service: Endoscopy;  Laterality: N/A;  12:00pm   ESOPHAGOGASTRODUODENOSCOPY (EGD) WITH PROPOFOL  N/A 07/26/2020   Procedure: ESOPHAGOGASTRODUODENOSCOPY (EGD) WITH PROPOFOL ;  Surgeon: Cindie Carlin POUR, DO;  [facility];  Service: Endoscopy;  Laterality:  N/A;   ESOPHAGOGASTRODUODENOSCOPY (EGD) WITH PROPOFOL  N/A 11/14/2021   Procedure: ESOPHAGOGASTRODUODENOSCOPY (EGD) WITH PROPOFOL ;  Surgeon: Avram Lupita BRAVO, MD;  Location: [facility] ENDOSCOPY;  Service: Gastroenterology;  Laterality: N/A;   RADICAL HYSTERECTOMY WITH TRANSPOSITION OF OVARIES     TOOTH EXTRACTION     TUBAL LIGATION  1993    Allergies and medications reviewed and updated.   Current Outpatient Medications:    acetaminophen  (TYLENOL ) 325 MG tablet, Take 3 tablets by mouth every 6 (six) hours as needed for moderate pain., Disp: , Rfl:    Albuterol -Budesonide  (AIRSUPRA ) 90-80 MCG/ACT AERO, Inhale 2 puffs into the lungs every 6 (six) hours as needed., Disp: 1 g, Rfl: 11   amoxicillin -clavulanate (AUGMENTIN ) 875-125 MG tablet, Take 1 tablet by mouth 2 (two) times daily., Disp: 14 tablet, Rfl: 0   gabapentin  (NEURONTIN ) 400 MG capsule, Take 1 capsule (400 mg total) by mouth 3 (three) times daily., Disp: 270 capsule, Rfl: 1   montelukast  (SINGULAIR ) 10 MG tablet, TAKE 1 TABLET BY MOUTH ONCE DAILY AS NEEDED, Disp: 90 tablet, Rfl: 0   nicotine  (NICODERM CQ  - DOSED IN MG/24 HR) 7 mg/24hr patch, Place 1 patch (7 mg total) onto the skin daily., Disp: 30 patch, Rfl: 1   nitroGLYCERIN  (NITROSTAT ) 0.4 MG SL tablet, Place 1 tablet (0.4 mg total) under the tongue every 5 (five) minutes as needed for chest pain., Disp: 25 tablet, Rfl: prn   omeprazole  (PRILOSEC) 40 MG capsule, TAKE 1 CAPSULE  BY MOUTH IN THE MORNING AND 1 CAPSULE AT BEDTIME, Disp: 90 capsule, Rfl: 0   ondansetron  (ZOFRAN ) 4 MG tablet, Take 1 tablet (4 mg total) by mouth every 8 (eight) hours as needed for nausea or vomiting., Disp: 10 tablet, Rfl: 0   Tiotropium Bromide-Olodaterol (STIOLTO RESPIMAT ) 2.5-2.5 MCG/ACT AERS, Inhale 2 puffs into the lungs daily., Disp: 1 each, Rfl: 11   valsartan  (DIOVAN ) 160 MG tablet, Take 1 tablet (160 mg total) by mouth daily., Disp: 30 tablet, Rfl: 11   VENTOLIN  HFA 108 (90 Base) MCG/ACT inhaler, INHALE 2  PUFFS BY MOUTH EVERY 6 HOURS AS NEEDED FOR WHEEZING OR SHORTNESS OF BREATH, Disp: 18 g, Rfl: 0  Allergies  Allergen Reactions   Asa [Aspirin] Anaphylaxis, Hives and Swelling   Codeine Nausea Only    Objective:   BP 124/82   Pulse 79   Temp 98.6 F (37 C)   Ht 4' 11 (1.499 m)   Wt 153 lb 9.6 oz (69.7 kg)   SpO2 99%   BMI 31.02 kg/m      10/24/2023    9:51 AM 09/16/2023   11:17 AM 08/27/2023    7:51 AM  Vitals with BMI  Height 4' 11 4' 11 4' 11  Weight 153 lbs 10 oz 147 lbs 13 oz 149 lbs  BMI 31.01 29.84 30.08  Systolic 124 115 871  Diastolic 82 75 80  Pulse 79 75 71     Physical Exam Vitals and nursing note reviewed.  Constitutional:      Appearance: Normal appearance. She is normal weight.  HENT:     Head: Normocephalic and atraumatic.     Right Ear: Ear canal and external ear normal. Tympanic membrane is bulging. Tympanic membrane is not erythematous.     Left Ear: Ear canal and external ear normal. Tympanic membrane is bulging. Tympanic membrane is not erythematous.     Nose:     Right Sinus: Maxillary sinus tenderness and frontal sinus tenderness present.     Left Sinus: Maxillary sinus tenderness and frontal sinus tenderness present.     Mouth/Throat:     Mouth: Mucous membranes are moist.     Dentition: Has dentures.     Pharynx: Oropharynx is clear.  Eyes:     Extraocular Movements: Extraocular movements intact.     Conjunctiva/sclera: Conjunctivae normal.     Pupils: Pupils are equal, round, and reactive to light.  Cardiovascular:     Rate and Rhythm: Normal rate and regular rhythm.     Pulses: Normal pulses.     Heart sounds: Normal heart sounds.  Pulmonary:     Effort: Pulmonary effort is normal. No respiratory distress.     Breath sounds: Rhonchi present. No wheezing.  Musculoskeletal:     Cervical back: No tenderness.  Lymphadenopathy:     Cervical: No cervical adenopathy.  Skin:    General: Skin is warm and dry.  Neurological:      General: No focal deficit present.     Mental Status: She is alert and oriented to person, place, and time. Mental status is at baseline.  Psychiatric:        Mood and Affect: Mood normal.        Behavior: Behavior normal.        Thought Content: Thought content normal.        Judgment: Judgment normal.     Assessment & Plan:  Acute bacterial sinusitis Assessment & Plan: Symptoms consistent with acute bacterial sinusitis.  Will start Augmentin  BID x7d. If symptoms worsen or you have SOB, wheezing, pain when breathing, fever seek medical care. Continue symptomatic management, push fluids, rest.    Other orders -     Amoxicillin -Pot Clavulanate; Take 1 tablet by mouth 2 (two) times daily.  Dispense: 14 tablet; Refill: 0     Follow up plan: Return if symptoms worsen or fail to improve.  Jeoffrey GORMAN Barrio, FNP

## 2023-10-24 NOTE — Assessment & Plan Note (Signed)
 Symptoms consistent with acute bacterial sinusitis. Will start Augmentin  BID x7d. If symptoms worsen or you have SOB, wheezing, pain when breathing, fever seek medical care. Continue symptomatic management, push fluids, rest.

## 2023-11-27 ENCOUNTER — Other Ambulatory Visit: Payer: Self-pay | Admitting: Family Medicine

## 2023-12-27 ENCOUNTER — Other Ambulatory Visit: Payer: Self-pay | Admitting: Internal Medicine

## 2023-12-30 ENCOUNTER — Telehealth: Payer: Self-pay | Admitting: Family Medicine

## 2023-12-30 ENCOUNTER — Other Ambulatory Visit: Payer: Self-pay

## 2023-12-30 NOTE — Telephone Encounter (Unsigned)
 Copied from CRM [id number]. Topic: Clinical - Medication Refill >> Dec 30, 2023  3:30 PM Kendralyn S wrote: Medication: valsartan  (DIOVAN ) 160 MG tablet  Has the patient contacted their pharmacy? Yes (Agent: If no, request that the patient contact the pharmacy for the refill. If patient does not wish to contact the pharmacy document the reason why and proceed with request.) (Agent: If yes, when and what did the pharmacy advise?)  This is the patient's preferred pharmacy:  [facility] [address], [place] - [address] [place] [address] Phone: [phone] Fax: [phone]  Is this the correct pharmacy for this prescription? Yes If no, delete pharmacy and type the correct one.   Has the prescription been filled recently? No  Is the patient out of the medication? Yes, missed dose for today  Has the patient been seen for an appointment in the last year OR does the patient have an upcoming appointment? Yes  Can we respond through MyChart? No  Agent: Please be advised that Rx refills may take up to 3 business days. We ask that you follow-up with your pharmacy.

## 2023-12-31 ENCOUNTER — Other Ambulatory Visit: Payer: Self-pay

## 2023-12-31 ENCOUNTER — Telehealth: Payer: Self-pay

## 2023-12-31 ENCOUNTER — Other Ambulatory Visit: Payer: Self-pay | Admitting: Family Medicine

## 2023-12-31 MED ORDER — VALSARTAN 160 MG PO TABS: 160.0000 mg | ORAL_TABLET | Freq: Every day | ORAL | 11 refills | Status: AC

## 2023-12-31 NOTE — Telephone Encounter (Signed)
 Copied from CRM [id number]. Topic: Clinical - Medication Refill >> Dec 30, 2023  3:30 PM Kendralyn S wrote: Medication: valsartan  (DIOVAN ) 160 MG tablet  Has the patient contacted their pharmacy? Yes (Agent: If no, request that the patient contact the pharmacy for the refill. If patient does not wish to contact the pharmacy document the reason why and proceed with request.) (Agent: If yes, when and what did the pharmacy advise?)  This is the patient's preferred pharmacy:  [facility] [address], [place] - [address] [place] [address] Phone: [phone] Fax: [phone]  Is this the correct pharmacy for this prescription? Yes If no, delete pharmacy and type the correct one.   Has the prescription been filled recently? No  Is the patient out of the medication? Yes, missed dose for today  Has the patient been seen for an appointment in the last year OR does the patient have an upcoming appointment? Yes  Can we respond through MyChart? No  Agent: Please be advised that Rx refills may take up to 3 business days. We ask that you follow-up with your pharmacy. >> Dec 31, 2023 10:59 AM Delon T wrote: Patient calling about her refill not being done yet and is out of meds- would like a phone call back  [phone]

## 2024-01-01 NOTE — Telephone Encounter (Signed)
 Duplicate request.  Requested Prescriptions  Pending Prescriptions Disp Refills   valsartan  (DIOVAN ) 160 MG tablet 30 tablet 11    Sig: Take 1 tablet (160 mg total) by mouth daily.     Cardiovascular:  Angiotensin Receptor Blockers Passed - 01/01/2024  3:36 PM      Passed - Cr in normal range and within 180 days    Creat  Date Value Ref Range Status  10/03/2023 0.91 0.50 - 1.03 mg/dL Final         Passed - K in normal range and within 180 days    Potassium  Date Value Ref Range Status  10/03/2023 4.6 3.5 - 5.3 mmol/L Final         Passed - Patient is not pregnant      Passed - Last BP in normal range    BP Readings from Last 1 Encounters:  10/24/23 124/82         Passed - Valid encounter within last 6 months    Recent Outpatient Visits           2 months ago Acute bacterial sinusitis   [place] [facility] Kayla Jeoffrey RAMAN, FNP   3 months ago Chronic midline low back pain with bilateral sciatica   [place] [facility] Kayla Jeoffrey RAMAN, FNP   4 months ago Primary hypertension   [place] [facility] Kayla Jeoffrey RAMAN, FNP   4 months ago Hyperlipidemia, unspecified hyperlipidemia type    [facility] Kayla Jeoffrey RAMAN, FNP   8 months ago Acute bacterial sinusitis    [facility] Kayla Jeoffrey RAMAN, [place]

## 2024-01-21 ENCOUNTER — Other Ambulatory Visit: Payer: Self-pay | Admitting: Family Medicine

## 2024-01-30 ENCOUNTER — Other Ambulatory Visit (HOSPITAL_COMMUNITY): Payer: Self-pay

## 2024-01-30 ENCOUNTER — Telehealth: Payer: Self-pay | Admitting: Pharmacy Technician

## 2024-01-30 ENCOUNTER — Ambulatory Visit: Payer: Self-pay

## 2024-01-30 ENCOUNTER — Other Ambulatory Visit: Payer: Self-pay | Admitting: Family Medicine

## 2024-01-30 NOTE — Telephone Encounter (Signed)
 Patient is scheduled tomorrow w/ Dr. DELENA at 3:40 pm

## 2024-01-30 NOTE — Telephone Encounter (Signed)
 Pharmacy Patient Advocate Encounter   Received notification from CoverMyMeds that prior authorization for Airsupra  90-80MCG/ACT aerosol is required/requested.   Insurance verification completed.   The patient is insured through [employer].   Per test claim:  Please see below list of what is preferred by the insurance.  If suggested medication is appropriate, Please send in a new RX and discontinue this one. If not, please advise as to why it's not appropriate so that we may request a Prior Authorization. Please note, some preferred medications may still require a PA.  If the suggested medications have not been trialed and there are no contraindications to their use, the PA will not be submitted, as it will not be approved.

## 2024-01-30 NOTE — Telephone Encounter (Signed)
 FYI Only or Action Required?: Action required by provider: ED refusal.  Patient was last seen in primary care on 10/24/2023 by Kayla Jeoffrey RAMAN, FNP.  Called Nurse Triage reporting Cough and Ear Pain.  Symptoms began several months ago.  Interventions attempted: Nothing.  Symptoms are: gradually worsening.  Triage Disposition: Go to ED Now (Notify PCP)  Patient/caregiver understands and will follow disposition?: No, wishes to speak with PCP              Copied from CRM #[id number]. Topic: Clinical - Red Word Triage >> Jan 30, 2024  9:24 AM Pamela Livingston wrote: Red Word that prompted transfer to Nurse Triage: Patient states she has had chest congestion for 3 months. Last time she has was in has a little ear pain but it has gotten worse and is now in both ears and its causing pain in her whole head.  Also would like to get a flu shot. Reason for Disposition  Chest pain   (Exception: MILD central chest pain, present only when coughing.)  Answer Assessment - Initial Assessment Questions Patient states symptoms of cough with chest congestion, bilateral ear pain and SOB all the time and worsening since October. Noted during triage: hacking, deep, wet cough.  She began complaining about her provider and stating she just prescribes antibiotics and doesn't give the patient any prednisone  which is what she needs to feel better. She then began also complaining about the service and care provided by the office and how her refills are not completed in a timely manner. RN redirected conversation to current symptoms.   Patient states she has been having constant heartburn for a couple of days. She states it feels different than her GERD. Left sided chest pain near breast, burning and constant. RN stopped triage and advised patient go to ED. She began shouting that going to the ED will kill her and that this was not helpful at all, asking for appointment with PCP and states  I don't know why they  transferred me to you, shouting at RN and hung up on RN.  Called [facility] and informed staff member, Nanette, of patient's ED refusal.  Protocols used: Cough - Chronic-A-AH

## 2024-01-31 ENCOUNTER — Encounter: Payer: Self-pay | Admitting: Family Medicine

## 2024-01-31 ENCOUNTER — Ambulatory Visit: Admitting: Family Medicine

## 2024-01-31 VITALS — BP 144/100 | HR 81 | Temp 97.9°F | Ht 59.0 in | Wt 153.5 lb

## 2024-01-31 DIAGNOSIS — J440 Chronic obstructive pulmonary disease with acute lower respiratory infection: Secondary | ICD-10-CM

## 2024-01-31 DIAGNOSIS — J209 Acute bronchitis, unspecified: Secondary | ICD-10-CM | POA: Diagnosis not present

## 2024-01-31 DIAGNOSIS — F17200 Nicotine dependence, unspecified, uncomplicated: Secondary | ICD-10-CM | POA: Diagnosis not present

## 2024-01-31 MED ORDER — NICOTINE 7 MG/24HR TD PT24: 7.0000 mg | MEDICATED_PATCH | Freq: Every day | TRANSDERMAL | 1 refills | Status: AC

## 2024-01-31 MED ORDER — METHYLPREDNISOLONE ACETATE 80 MG/ML IJ SUSP
80.0000 mg | Freq: Once | INTRAMUSCULAR | Status: AC
  Administered 2024-01-31: 80 mg via INTRAMUSCULAR

## 2024-01-31 MED ORDER — AZITHROMYCIN 250 MG PO TABS: ORAL_TABLET | ORAL | 0 refills | Status: AC

## 2024-01-31 MED ORDER — PREDNISONE 10 MG (21) PO TBPK: ORAL_TABLET | ORAL | 0 refills | Status: AC

## 2024-01-31 NOTE — Progress Notes (Addendum)
 "  Patient Office Visit  Assessment & Plan:  Acute bronchitis with COPD (HCC) -     methylPREDNISolone  Acetate -     Azithromycin ; Take 2 tablets (500 mg) PO today, then 1 tablet (250 mg) PO daily x4 days.  Dispense: 6 tablet; Refill: 0 -     predniSONE ; Use as directed.  Dispense: 21 each; Refill: 0 -     Pulmonary Visit -     Nicotine ; Place 1 patch (7 mg total) onto the skin daily.  Dispense: 30 patch; Refill: 1  Tobacco use disorder -     Nicotine ; Place 1 patch (7 mg total) onto the skin daily.  Dispense: 30 patch; Refill: 1   Assessment and Plan    Chronic obstructive pulmonary disease with acute lower respiratory infection COPD exacerbation with productive cough and wheezing. Likely bacterial infection. Oxygen saturation stable at 98%. Increased heart rate due to respiratory distress. No recent pulmonologist follow-up. - Administered antibiotic injection in office. - Prescribed azithromycin  (Z-Pak). - Prescribed prednisone  to start the following day. - Ordered follow-up with pulmonologist.  Nicotine  dependence Long-term smoker reducing intake. Nicotine  patches not effectively curbing cravings. - Discussed potential Chantix use, pending insurance coverage. - Continue nicotine  patches, advised not to smoke while using patches.     Patient will monitor blood pressure while she is on the steroids.  Pulmonary consult ordered in [place] with Dr. Darlean.   Recommend tobacco cessation.  Patient will follow-up if not improving. Return if symptoms worsen or fail to improve.   Subjective:    Patient ID: Pamela Livingston, female    DOB: October 06, 1965  Age: 59 y.o. MRN: [id number]  Chief Complaint  Patient presents with   Cough    Hacking cough associated with nasal congestion. Pt states this has been going on since October.     Cough   Discussed the use of AI scribe software for clinical note transcription with the patient, who gave verbal consent to proceed.  History of Present  Illness       History of Present Illness Pamela Livingston is a 59 year old female with COPD who presents with persistent cough and respiratory symptoms since October with worsening symptoms the past few days  She has experienced a persistent cough since October, which has not improved despite a course of antibiotics. The cough is steady and sometimes productive of light green sputum. No fever or chills are present, but she experiences chest and head pain from coughing.  She has a history of COPD and asthma, with current symptoms of wheezing and congestion. Her current medications include Ventolin , tiotropium bromide, and Singulair  for allergies. She also uses albuterol  and has two inhalers ready for pickup at the pharmacy, though she is unsure of their names. She has previously been prescribed prednisone  and antibiotics, which she believes help her condition.  She has a significant smoking history, having started at age 38 and previously smoked up to two packs a day. She is currently trying to quit and has reduced her smoking to four cigarettes a day. She is using nicotine  patches but feels she needs to restart with step one of the patches to curb her cravings effectively.  She has a family history of dementia and is concerned about her own cognitive health, mentioning a need for testing. She has been managing her blood pressure, especially when on prednisone , and is allergic to aspirin.  No recent exposure to sick individuals and has tested negative for COVID-19.  Physical Exam VITALS: SaO2- 98% HEENT: Ears normal. CHEST: Wheezing and congestion present. CARDIOVASCULAR: Tachycardia present.  Assessment and Plan Chronic obstructive pulmonary disease with acute lower respiratory infection COPD exacerbation with productive cough and wheezing. Likely bacterial infection. Oxygen saturation stable at 98%. Increased heart rate due to respiratory distress. No recent pulmonologist follow-up. -  Administered antibiotic injection in office. - Prescribed azithromycin  (Z-Pak). - Prescribed prednisone  to start the following day. - Ordered follow-up with pulmonologist.  Nicotine  dependence Long-term smoker reducing intake. Nicotine  patches not effectively curbing cravings. - Discussed potential Chantix use, pending insurance coverage. - Continue nicotine  patches, advised not to smoke while using patches.    The 10-year ASCVD risk score (Arnett DK, et al., 2019) is: 6.2%  Past Medical History:  Diagnosis Date   Asthma    Chronic pelvic pain in female    COPD (chronic obstructive pulmonary disease) (HCC)    Endometriosis    Pneumonia 10/2021   Polysubstance abuse (HCC)    marijuana, cocaine, benzos   Stomach ulcer    Suicide attempt (HCC) 2008   by phenergan  overdose   Syncope    Past Surgical History:  Procedure Laterality Date   ABDOMINAL HYSTERECTOMY     BALLOON DILATION N/A 07/26/2020   Procedure: BALLOON DILATION;  Surgeon: Cindie Carlin POUR, DO;  [facility];  Service: Endoscopy;  Laterality: N/A;   BIOPSY  07/26/2020   Procedure: BIOPSY;  Surgeon: Cindie Carlin POUR, DO;  [facility];  Service: Endoscopy;;   BIOPSY  11/14/2021   Procedure: BIOPSY;  Surgeon: Avram Lupita BRAVO, MD;  Location: [facility] ENDOSCOPY;  Service: Gastroenterology;;   CERVICAL CONE BIOPSY     cervical cancer   CESAREAN SECTION  1993   CHOLECYSTECTOMY     COLONOSCOPY WITH PROPOFOL  N/A 07/26/2020   Procedure: COLONOSCOPY WITH PROPOFOL ;  Surgeon: Cindie Carlin POUR, DO;  [facility];  Service: Endoscopy;  Laterality: N/A;  12:00pm   ESOPHAGOGASTRODUODENOSCOPY (EGD) WITH PROPOFOL  N/A 07/26/2020   Procedure: ESOPHAGOGASTRODUODENOSCOPY (EGD) WITH PROPOFOL ;  Surgeon: Cindie Carlin POUR, DO;  [facility];  Service: Endoscopy;  Laterality: N/A;   ESOPHAGOGASTRODUODENOSCOPY (EGD) WITH PROPOFOL  N/A 11/14/2021   Procedure: ESOPHAGOGASTRODUODENOSCOPY (EGD) WITH PROPOFOL ;   Surgeon: Avram Lupita BRAVO, MD;  Location: [facility] ENDOSCOPY;  Service: Gastroenterology;  Laterality: N/A;   RADICAL HYSTERECTOMY WITH TRANSPOSITION OF OVARIES     TOOTH EXTRACTION     TUBAL LIGATION  1993   Social History[1] Family History  Problem Relation Age of Onset   Heart disease Mother    Hypertension Mother    Diabetes Mother    Rheum arthritis Mother    Heart disease Father    Hypertension Father    Heart attack Sister    Stroke Sister    Dementia Sister    Heart attack Maternal Uncle    Heart attack Maternal Uncle    COPD Paternal Grandfather    Colon cancer Neg Hx    Allergies[2]  Review of Systems  Respiratory:  Positive for cough.       Objective:    BP (!) 144/100   Pulse 81   Temp 97.9 F (36.6 C)   Ht 4' 11 (1.499 m)   Wt 153 lb 8 oz (69.6 kg)   SpO2 98%   BMI 31.00 kg/m  BP Readings from Last 3 Encounters:  01/31/24 (!) 144/100  10/24/23 124/82  09/16/23 115/75   Wt Readings from Last 3 Encounters:  01/31/24 153 lb  8 oz (69.6 kg)  10/24/23 153 lb 9.6 oz (69.7 kg)  09/16/23 147 lb 12.8 oz (67 kg)    Physical Exam Vitals and nursing note reviewed.  Constitutional:      General: She is not in acute distress.    Appearance: Normal appearance.  HENT:     Head: Normocephalic.     Right Ear: Tympanic membrane, ear canal and external ear normal.     Left Ear: Tympanic membrane, ear canal and external ear normal.  Eyes:     Extraocular Movements: Extraocular movements intact.     Pupils: Pupils are equal, round, and reactive to light.  Cardiovascular:     Rate and Rhythm: Regular rhythm. Tachycardia present.     Heart sounds: Normal heart sounds.  Pulmonary:     Breath sounds: Examination of the right-lower field reveals wheezing. Examination of the left-lower field reveals wheezing. Wheezing and rhonchi present.  Musculoskeletal:     Right lower leg: No edema.     Left lower leg: No edema.  Neurological:     General: No focal deficit  present.     Mental Status: She is alert and oriented to person, place, and time.  Psychiatric:        Mood and Affect: Mood normal.        Behavior: Behavior normal.      No results found for any visits on 01/31/24.          [1]  Social History Tobacco Use   Smoking status: Some Days    Current packs/day: 0.00    Average packs/day: 0.5 packs/day for 27.0 years (13.5 ttl pk-yrs)    Types: Cigarettes    Start date: 11/22/1994    Last attempt to quit: 11/21/2021    Years since quitting: 2.1   Smokeless tobacco: Never   Tobacco comments:    1 cig every other day  Vaping Use   Vaping status: Never Used  Substance Use Topics   Alcohol use: Not Currently   Drug use: Yes    Frequency: 7.0 times per week    Types: Marijuana  [2]  Allergies Allergen Reactions   Asa [Aspirin] Anaphylaxis, Hives and Swelling   Codeine Nausea Only   "

## 2024-02-12 ENCOUNTER — Other Ambulatory Visit: Payer: Self-pay | Admitting: Family Medicine

## 2024-02-13 ENCOUNTER — Ambulatory Visit: Payer: Self-pay

## 2024-02-13 NOTE — Telephone Encounter (Signed)
 FYI Only or Action Required?: FYI only for provider: appointment scheduled on 1/23.  Patient was last seen in primary care on 01/31/2024 by Aletha Bene, MD.  Called Nurse Triage reporting Sore Throat.  Symptoms began yesterday.  Interventions attempted: OTC medications: tylenol .  Symptoms are: unchanged.  Triage Disposition: See Physician Within 24 Hours  Patient/caregiver understands and will follow disposition?: Yes, will follow disposition    Reason for Triage: Pt calling reports she is having throat pain/soreness.  Onset of yesterday morning. No other symptoms.  Pt requesting an appt for evaluation.     Reason for Disposition  SEVERE throat pain (e.g., excruciating)  Answer Assessment - Initial Assessment Questions 1. ONSET: When did the throat start hurting? (Hours or days ago)      yesterday 2. SEVERITY: How bad is the sore throat? (Scale 1-10; mild, moderate or severe)     8 3. STREP EXPOSURE: Has there been any exposure to strep within the past week? If Yes, ask: What type of contact occurred?      denies 4.  VIRAL SYMPTOMS: Are there any symptoms of a cold, such as a runny nose, cough, hoarse voice or red eyes?      denies 5. FEVER: Do you have a fever? If Yes, ask: What is your temperature, how was it measured, and when did it start?     denies 6. PUS ON THE TONSILS: Is there pus on the tonsils in the back of your throat?     denies 7. OTHER SYMPTOMS: Do you have any other symptoms? (e.g., difficulty breathing, headache, rash)     Denies  Pt requesting appt.  Protocols used: Sore Throat-A-AH

## 2024-02-14 ENCOUNTER — Encounter: Payer: Self-pay | Admitting: Family Medicine

## 2024-02-14 ENCOUNTER — Ambulatory Visit: Admitting: Family Medicine

## 2024-02-14 VITALS — BP 120/62 | HR 93 | Temp 97.7°F | Ht 59.0 in | Wt 159.0 lb

## 2024-02-14 DIAGNOSIS — J029 Acute pharyngitis, unspecified: Secondary | ICD-10-CM | POA: Diagnosis not present

## 2024-02-14 DIAGNOSIS — R252 Cramp and spasm: Secondary | ICD-10-CM | POA: Diagnosis not present

## 2024-02-14 DIAGNOSIS — B370 Candidal stomatitis: Secondary | ICD-10-CM | POA: Diagnosis not present

## 2024-02-14 LAB — COMPREHENSIVE METABOLIC PANEL WITH GFR
AG Ratio: 1.6 (calc) (ref 1.0–2.5)
ALT: 28 U/L (ref 6–29)
AST: 21 U/L (ref 10–35)
Albumin: 4.5 g/dL (ref 3.6–5.1)
Alkaline phosphatase (APISO): 100 U/L (ref 37–153)
BUN/Creatinine Ratio: 33 (calc) — ABNORMAL HIGH (ref 6–22)
BUN: 29 mg/dL — ABNORMAL HIGH (ref 7–25)
CO2: 27 mmol/L (ref 20–32)
Calcium: 9.8 mg/dL (ref 8.6–10.4)
Chloride: 103 mmol/L (ref 98–110)
Creat: 0.88 mg/dL (ref 0.50–1.03)
Globulin: 2.8 g/dL (ref 1.9–3.7)
Glucose, Bld: 88 mg/dL (ref 65–99)
Potassium: 5.3 mmol/L (ref 3.5–5.3)
Sodium: 136 mmol/L (ref 135–146)
Total Bilirubin: 0.4 mg/dL (ref 0.2–1.2)
Total Protein: 7.3 g/dL (ref 6.1–8.1)
eGFR: 77 mL/min/1.73m2

## 2024-02-14 LAB — CBC WITH DIFFERENTIAL/PLATELET
Absolute Lymphocytes: 3821 {cells}/uL (ref 850–3900)
Absolute Monocytes: 752 {cells}/uL (ref 200–950)
Basophils Absolute: 62 {cells}/uL (ref 0–200)
Basophils Relative: 0.6 %
Eosinophils Absolute: 319 {cells}/uL (ref 15–500)
Eosinophils Relative: 3.1 %
HCT: 42.5 % (ref 35.9–46.0)
Hemoglobin: 14.7 g/dL (ref 11.7–15.5)
MCH: 32.5 pg (ref 27.0–33.0)
MCHC: 34.6 g/dL (ref 31.6–35.4)
MCV: 94 fL (ref 81.4–101.7)
MPV: 9.9 fL (ref 7.5–12.5)
Monocytes Relative: 7.3 %
Neutro Abs: 5346 {cells}/uL (ref 1500–7800)
Neutrophils Relative %: 51.9 %
Platelets: 362 Thousand/uL (ref 140–400)
RBC: 4.52 Million/uL (ref 3.80–5.10)
RDW: 13.3 % (ref 11.0–15.0)
Total Lymphocyte: 37.1 %
WBC: 10.3 Thousand/uL (ref 3.8–10.8)

## 2024-02-14 LAB — MAGNESIUM: Magnesium: 2 mg/dL (ref 1.5–2.5)

## 2024-02-14 MED ORDER — CYCLOBENZAPRINE HCL 10 MG PO TABS: 10.0000 mg | ORAL_TABLET | Freq: Three times a day (TID) | ORAL | 0 refills | Status: AC | PRN

## 2024-02-14 MED ORDER — NYSTATIN 100000 UNIT/ML MT SUSP: 5.0000 mL | Freq: Four times a day (QID) | OROMUCOSAL | 1 refills | Status: AC

## 2024-02-14 NOTE — Progress Notes (Signed)
 "  Subjective:    Patient ID: Pamela Livingston, female    DOB: [birth year], 59 y.o.   MRN: [id number]  Patient states that she has had a sore throat for several days.  She denies any fevers or chills.  She denies any body aches.  She denies any runny nose or ear pain.  On examination today she has white thrush like plaque all over her tongue.  There is also erythema in the posterior oropharynx.  She has been using prednisone  and antibiotics recently for a COPD exacerbation.  She also complains of diffuse muscle spasms.  She states that she is even having cramps in her perineum.  She reports leg cramps and arm cramps.  These are happening for no reason.  I asked if the patient was drinking enough liquid and she states that she is.       Past Medical History:  Diagnosis Date   Asthma    Chronic pelvic pain in female    COPD (chronic obstructive pulmonary disease) (HCC)    Endometriosis    Pneumonia 10/2021   Polysubstance abuse (HCC)    marijuana, cocaine, benzos   Stomach ulcer    Suicide attempt (HCC) 2008   by phenergan  overdose   Syncope    Past Surgical History:  Procedure Laterality Date   ABDOMINAL HYSTERECTOMY     BALLOON DILATION N/A 07/26/2020   Procedure: BALLOON DILATION;  Surgeon: Cindie Carlin POUR, DO;  Location: [facility];  Service: Endoscopy;  Laterality: N/A;   BIOPSY  07/26/2020   Procedure: BIOPSY;  Surgeon: Cindie Carlin POUR, DO;  Location: [facility];  Service: Endoscopy;;   BIOPSY  11/14/2021   Procedure: BIOPSY;  Surgeon: Avram Lupita BRAVO, MD;  Location[facility];  Service: Gastroenterology;;   CERVICAL CONE BIOPSY     cervical cancer   CESAREAN SECTION  1993   CHOLECYSTECTOMY     COLONOSCOPY WITH PROPOFOL  N/A 07/26/2020   Procedure: COLONOSCOPY WITH PROPOFOL ;  Surgeon: Cindie Carlin POUR, DO;  Location: [facility];  Service: Endoscopy;  Laterality: N/A;  12:00pm   ESOPHAGOGASTRODUODENOSCOPY (EGD) WITH PROPOFOL  N/A 07/26/2020   Procedure:  ESOPHAGOGASTRODUODENOSCOPY (EGD) WITH PROPOFOL ;  Surgeon: Cindie Carlin POUR, DO;  Location: [facility];  Service: Endoscopy;  Laterality: N/A;   ESOPHAGOGASTRODUODENOSCOPY (EGD) WITH PROPOFOL  N/A 11/14/2021   Procedure: ESOPHAGOGASTRODUODENOSCOPY (EGD) WITH PROPOFOL ;  Surgeon: Avram Lupita BRAVO, MD;  Location: [facility] ENDOSCOPY;  Service: Gastroenterology;  Laterality: N/A;   RADICAL HYSTERECTOMY WITH TRANSPOSITION OF OVARIES     TOOTH EXTRACTION     TUBAL LIGATION  1993   Current Outpatient Medications on File Prior to Visit  Medication Sig Dispense Refill   acetaminophen  (TYLENOL ) 325 MG tablet Take 3 tablets by mouth every 6 (six) hours as needed for moderate pain.     Albuterol -Budesonide  (AIRSUPRA ) 90-80 MCG/ACT AERO Inhale 2 puffs into the lungs every 6 (six) hours as needed. 1 g 11   amoxicillin -clavulanate (AUGMENTIN ) 875-125 MG tablet Take 1 tablet by mouth 2 (two) times daily. 14 tablet 0   atorvastatin  (LIPITOR) 80 MG tablet Take 1 tablet by mouth once daily 90 tablet 0   azithromycin  (ZITHROMAX  Z-PAK) 250 MG tablet Take 2 tablets (500 mg) PO today, then 1 tablet (250 mg) PO daily x4 days. 6 tablet 0   gabapentin  (NEURONTIN ) 400 MG capsule Take 1 capsule (400 mg total) by mouth 3 (three) times daily. 270 capsule 1   montelukast  (SINGULAIR ) 10 MG tablet TAKE 1 TABLET  BY MOUTH ONCE DAILY AS NEEDED 90 tablet 0   nicotine  (NICODERM CQ  - DOSED IN MG/24 HR) 7 mg/24hr patch Place 1 patch (7 mg total) onto the skin daily. 30 patch 1   nitroGLYCERIN  (NITROSTAT ) 0.4 MG SL tablet Place 1 tablet (0.4 mg total) under the tongue every 5 (five) minutes as needed for chest pain. 25 tablet prn   omeprazole  (PRILOSEC) 40 MG capsule TAKE 1 CAPSULE BY MOUTH IN THE MORNING AND 1 CAPSULE AT BEDTIME 90 capsule 0   ondansetron  (ZOFRAN ) 4 MG tablet Take 1 tablet (4 mg total) by mouth every 8 (eight) hours as needed for nausea or vomiting. 10 tablet 0   predniSONE  (STERAPRED UNI-PAK 21 TAB) 10 MG (21) TBPK tablet  Use as directed. 21 each 0   Tiotropium Bromide-Olodaterol (STIOLTO RESPIMAT ) 2.5-2.5 MCG/ACT AERS Inhale 2 puffs into the lungs daily. 1 each 11   valsartan  (DIOVAN ) 160 MG tablet Take 1 tablet (160 mg total) by mouth daily. 30 tablet 11   VENTOLIN  HFA 108 (90 Base) MCG/ACT inhaler INHALE 2 PUFFS BY MOUTH EVERY 6 HOURS AS NEEDED FOR WHEEZING OR SHORTNESS OF BREATH 18 g 0   No current facility-administered medications on file prior to visit.   Allergies  Allergen Reactions   Asa [Aspirin] Anaphylaxis, Hives and Swelling   Codeine Nausea Only      Review of Systems  All other systems reviewed and are negative.      Objective:   Physical Exam Constitutional:      Appearance: She is normal weight. She is not ill-appearing or toxic-appearing.  HENT:     Right Ear: Tympanic membrane and ear canal normal.     Left Ear: Tympanic membrane and ear canal normal.     Mouth/Throat:     Pharynx: Oropharyngeal exudate and posterior oropharyngeal erythema present.   Cardiovascular:     Rate and Rhythm: Normal rate and regular rhythm.     Heart sounds: Normal heart sounds.  Pulmonary:     Effort: Pulmonary effort is normal.     Breath sounds: Normal breath sounds.  Neurological:     Mental Status: She is alert.          Assessment & Plan:  Sore throat - Plan: STREP GROUP A AG, W/REFLEX TO CULT  Muscle cramps - Plan: Comprehensive metabolic panel with GFR, CBC with Differential/Platelet, Magnesium  I will treat the patient's thrush with nystatin , 1 teaspoon 4 times daily for 7 days.  Strep test is negative.  Given her severe muscle cramps, I will check a CMP as well as a magnesium  level to check her electrolytes.  Meanwhile she can use Flexeril  10 mg every 8 hours as needed for muscle cramps.  Encouraged hydration with Gatorade "

## 2024-02-16 LAB — STREP GROUP A AG, W/REFLEX TO CULT: Streptococcus Group A AG: NOT DETECTED

## 2024-02-16 LAB — CULTURE, GROUP A STREP
Micro Number: 17508108
SPECIMEN QUALITY:: ADEQUATE

## 2024-02-18 ENCOUNTER — Ambulatory Visit: Payer: Self-pay | Admitting: Family Medicine

## 2024-02-25 ENCOUNTER — Ambulatory Visit: Admitting: Internal Medicine

## 2024-03-19 ENCOUNTER — Ambulatory Visit: Admitting: Internal Medicine
